# Patient Record
Sex: Male | Born: 1955 | Race: Black or African American | Hispanic: No | Marital: Single | Smoking: Former smoker
Health system: Southern US, Community
[De-identification: ages and names within clinical notes are randomized; demographics above are authoritative.]

## PROBLEM LIST (undated history)

## (undated) DIAGNOSIS — D500 Iron deficiency anemia secondary to blood loss (chronic): Secondary | ICD-10-CM

## (undated) DIAGNOSIS — Z8673 Personal history of transient ischemic attack (TIA), and cerebral infarction without residual deficits: Secondary | ICD-10-CM

## (undated) DIAGNOSIS — Z7901 Long term (current) use of anticoagulants: Secondary | ICD-10-CM

## (undated) DIAGNOSIS — C801 Malignant (primary) neoplasm, unspecified: Secondary | ICD-10-CM

## (undated) DIAGNOSIS — E7800 Pure hypercholesterolemia, unspecified: Secondary | ICD-10-CM

## (undated) DIAGNOSIS — Z7189 Other specified counseling: Secondary | ICD-10-CM

## (undated) DIAGNOSIS — Z9229 Personal history of other drug therapy: Secondary | ICD-10-CM

## (undated) DIAGNOSIS — I519 Heart disease, unspecified: Secondary | ICD-10-CM

## (undated) DIAGNOSIS — Z952 Presence of prosthetic heart valve: Secondary | ICD-10-CM

## (undated) DIAGNOSIS — I639 Cerebral infarction, unspecified: Secondary | ICD-10-CM

## (undated) HISTORY — DX: Other specified counseling: Z71.89

## (undated) HISTORY — PX: AORTIC VALVE REPLACEMENT: SHX41

## (undated) HISTORY — PX: MITRAL VALVE REPLACEMENT: SHX147

## (undated) HISTORY — DX: Pure hypercholesterolemia, unspecified: E78.00

## (undated) HISTORY — DX: Iron deficiency anemia secondary to blood loss (chronic): D50.0

## (undated) HISTORY — PX: CARDIAC VALVE REPLACEMENT: SHX585

## (undated) HISTORY — DX: Heart disease, unspecified: I51.9

## (undated) HISTORY — DX: Cerebral infarction, unspecified: I63.9

---

## 1997-11-16 ENCOUNTER — Encounter: Admission: RE | Admit: 1997-11-16 | Discharge: 1997-11-16 | Payer: Self-pay | Admitting: Family Medicine

## 1997-11-26 ENCOUNTER — Encounter: Admission: RE | Admit: 1997-11-26 | Discharge: 1997-11-26 | Payer: Self-pay | Admitting: Family Medicine

## 1997-12-08 ENCOUNTER — Encounter: Admission: RE | Admit: 1997-12-08 | Discharge: 1997-12-08 | Payer: Self-pay | Admitting: Family Medicine

## 1997-12-28 ENCOUNTER — Encounter: Admission: RE | Admit: 1997-12-28 | Discharge: 1997-12-28 | Payer: Self-pay | Admitting: Family Medicine

## 1997-12-30 ENCOUNTER — Encounter: Admission: RE | Admit: 1997-12-30 | Discharge: 1997-12-30 | Payer: Self-pay | Admitting: Family Medicine

## 1998-01-04 ENCOUNTER — Encounter: Admission: RE | Admit: 1998-01-04 | Discharge: 1998-01-04 | Payer: Self-pay | Admitting: Family Medicine

## 1998-01-11 ENCOUNTER — Encounter: Admission: RE | Admit: 1998-01-11 | Discharge: 1998-01-11 | Payer: Self-pay | Admitting: Family Medicine

## 1998-01-12 ENCOUNTER — Encounter: Admission: RE | Admit: 1998-01-12 | Discharge: 1998-01-12 | Payer: Self-pay | Admitting: Family Medicine

## 1998-01-18 ENCOUNTER — Encounter: Admission: RE | Admit: 1998-01-18 | Discharge: 1998-01-18 | Payer: Self-pay | Admitting: Family Medicine

## 1998-01-21 ENCOUNTER — Encounter: Admission: RE | Admit: 1998-01-21 | Discharge: 1998-01-21 | Payer: Self-pay | Admitting: Sports Medicine

## 1998-01-28 ENCOUNTER — Encounter: Admission: RE | Admit: 1998-01-28 | Discharge: 1998-01-28 | Payer: Self-pay | Admitting: Family Medicine

## 1998-02-04 ENCOUNTER — Encounter: Admission: RE | Admit: 1998-02-04 | Discharge: 1998-02-04 | Payer: Self-pay | Admitting: Family Medicine

## 1998-02-11 ENCOUNTER — Encounter: Admission: RE | Admit: 1998-02-11 | Discharge: 1998-02-11 | Payer: Self-pay | Admitting: Family Medicine

## 1998-02-18 ENCOUNTER — Encounter: Admission: RE | Admit: 1998-02-18 | Discharge: 1998-02-18 | Payer: Self-pay | Admitting: Family Medicine

## 1998-03-01 ENCOUNTER — Encounter: Admission: RE | Admit: 1998-03-01 | Discharge: 1998-03-01 | Payer: Self-pay | Admitting: Family Medicine

## 1998-03-15 ENCOUNTER — Encounter: Admission: RE | Admit: 1998-03-15 | Discharge: 1998-03-15 | Payer: Self-pay | Admitting: Family Medicine

## 1998-04-02 ENCOUNTER — Encounter: Admission: RE | Admit: 1998-04-02 | Discharge: 1998-04-02 | Payer: Self-pay | Admitting: Family Medicine

## 1998-04-09 ENCOUNTER — Encounter: Admission: RE | Admit: 1998-04-09 | Discharge: 1998-04-09 | Payer: Self-pay | Admitting: Family Medicine

## 1998-05-14 ENCOUNTER — Encounter: Admission: RE | Admit: 1998-05-14 | Discharge: 1998-05-14 | Payer: Self-pay | Admitting: Family Medicine

## 1998-05-28 ENCOUNTER — Encounter: Admission: RE | Admit: 1998-05-28 | Discharge: 1998-05-28 | Payer: Self-pay | Admitting: Family Medicine

## 1998-06-15 ENCOUNTER — Encounter: Admission: RE | Admit: 1998-06-15 | Discharge: 1998-06-15 | Payer: Self-pay | Admitting: Family Medicine

## 1998-06-16 ENCOUNTER — Encounter: Admission: RE | Admit: 1998-06-16 | Discharge: 1998-06-16 | Payer: Self-pay | Admitting: Family Medicine

## 1998-06-29 ENCOUNTER — Encounter: Admission: RE | Admit: 1998-06-29 | Discharge: 1998-06-29 | Payer: Self-pay | Admitting: Sports Medicine

## 1998-08-19 ENCOUNTER — Encounter: Admission: RE | Admit: 1998-08-19 | Discharge: 1998-08-19 | Payer: Self-pay | Admitting: Family Medicine

## 2000-01-05 ENCOUNTER — Emergency Department (HOSPITAL_COMMUNITY): Admission: EM | Admit: 2000-01-05 | Discharge: 2000-01-05 | Payer: Self-pay | Admitting: Emergency Medicine

## 2000-01-06 ENCOUNTER — Emergency Department (HOSPITAL_COMMUNITY): Admission: EM | Admit: 2000-01-06 | Discharge: 2000-01-06 | Payer: Self-pay | Admitting: Emergency Medicine

## 2002-07-22 ENCOUNTER — Emergency Department (HOSPITAL_COMMUNITY): Admission: EM | Admit: 2002-07-22 | Discharge: 2002-07-22 | Payer: Self-pay | Admitting: Emergency Medicine

## 2003-05-28 ENCOUNTER — Inpatient Hospital Stay (HOSPITAL_COMMUNITY): Admission: EM | Admit: 2003-05-28 | Discharge: 2003-06-03 | Payer: Self-pay | Admitting: Emergency Medicine

## 2003-05-28 ENCOUNTER — Encounter: Payer: Self-pay | Admitting: Emergency Medicine

## 2003-11-30 ENCOUNTER — Inpatient Hospital Stay (HOSPITAL_COMMUNITY): Admission: AC | Admit: 2003-11-30 | Discharge: 2003-12-10 | Payer: Self-pay

## 2003-12-02 ENCOUNTER — Encounter: Payer: Self-pay | Admitting: Cardiology

## 2003-12-15 ENCOUNTER — Ambulatory Visit (HOSPITAL_COMMUNITY): Admission: RE | Admit: 2003-12-15 | Discharge: 2003-12-15 | Payer: Self-pay | Admitting: General Surgery

## 2004-05-26 ENCOUNTER — Ambulatory Visit: Payer: Self-pay | Admitting: *Deleted

## 2004-06-09 ENCOUNTER — Ambulatory Visit: Payer: Self-pay | Admitting: Internal Medicine

## 2004-06-20 ENCOUNTER — Ambulatory Visit: Payer: Self-pay | Admitting: Cardiology

## 2004-07-13 ENCOUNTER — Ambulatory Visit: Payer: Self-pay | Admitting: Cardiology

## 2004-08-22 ENCOUNTER — Ambulatory Visit: Payer: Self-pay

## 2004-09-08 ENCOUNTER — Ambulatory Visit: Payer: Self-pay | Admitting: Cardiology

## 2007-02-08 ENCOUNTER — Ambulatory Visit: Payer: Self-pay | Admitting: Internal Medicine

## 2007-02-11 ENCOUNTER — Ambulatory Visit: Payer: Self-pay | Admitting: *Deleted

## 2007-02-12 ENCOUNTER — Ambulatory Visit: Payer: Self-pay | Admitting: Internal Medicine

## 2007-02-18 ENCOUNTER — Ambulatory Visit: Payer: Self-pay | Admitting: Internal Medicine

## 2007-02-25 ENCOUNTER — Ambulatory Visit: Payer: Self-pay | Admitting: Internal Medicine

## 2007-03-04 ENCOUNTER — Ambulatory Visit: Payer: Self-pay | Admitting: Internal Medicine

## 2007-04-01 ENCOUNTER — Ambulatory Visit: Payer: Self-pay | Admitting: Internal Medicine

## 2007-04-08 ENCOUNTER — Ambulatory Visit: Payer: Self-pay | Admitting: Internal Medicine

## 2007-05-06 ENCOUNTER — Ambulatory Visit: Payer: Self-pay | Admitting: Internal Medicine

## 2007-05-10 ENCOUNTER — Ambulatory Visit: Payer: Self-pay | Admitting: Internal Medicine

## 2007-05-30 ENCOUNTER — Ambulatory Visit: Payer: Self-pay | Admitting: Internal Medicine

## 2007-06-21 ENCOUNTER — Ambulatory Visit: Payer: Self-pay | Admitting: Internal Medicine

## 2007-06-21 LAB — CONVERTED CEMR LAB
ALT: 30 units/L (ref 0–53)
Albumin: 4.3 g/dL (ref 3.5–5.2)
Alkaline Phosphatase: 87 units/L (ref 39–117)
BUN: 16 mg/dL (ref 6–23)
Calcium: 9.5 mg/dL (ref 8.4–10.5)
Chloride: 106 meq/L (ref 96–112)
Cholesterol: 197 mg/dL (ref 0–200)
Creatinine, Ser: 1.06 mg/dL (ref 0.40–1.50)
Potassium: 4.5 meq/L (ref 3.5–5.3)
Triglycerides: 80 mg/dL (ref <150)

## 2007-06-28 ENCOUNTER — Ambulatory Visit: Payer: Self-pay | Admitting: Internal Medicine

## 2007-07-24 ENCOUNTER — Ambulatory Visit: Payer: Self-pay | Admitting: Internal Medicine

## 2007-08-22 ENCOUNTER — Ambulatory Visit: Payer: Self-pay | Admitting: Internal Medicine

## 2007-09-12 ENCOUNTER — Ambulatory Visit: Payer: Self-pay | Admitting: Internal Medicine

## 2007-09-19 ENCOUNTER — Ambulatory Visit: Payer: Self-pay | Admitting: Internal Medicine

## 2007-09-26 ENCOUNTER — Ambulatory Visit: Payer: Self-pay | Admitting: Internal Medicine

## 2007-10-07 ENCOUNTER — Ambulatory Visit: Payer: Self-pay | Admitting: Internal Medicine

## 2007-10-15 ENCOUNTER — Ambulatory Visit: Payer: Self-pay | Admitting: Internal Medicine

## 2007-11-14 ENCOUNTER — Ambulatory Visit: Payer: Self-pay | Admitting: Internal Medicine

## 2007-11-28 ENCOUNTER — Ambulatory Visit: Payer: Self-pay | Admitting: Internal Medicine

## 2007-12-16 ENCOUNTER — Ambulatory Visit: Payer: Self-pay | Admitting: Internal Medicine

## 2007-12-26 ENCOUNTER — Ambulatory Visit: Payer: Self-pay | Admitting: Internal Medicine

## 2008-01-09 ENCOUNTER — Ambulatory Visit: Payer: Self-pay | Admitting: Internal Medicine

## 2008-02-02 ENCOUNTER — Inpatient Hospital Stay (HOSPITAL_COMMUNITY): Admission: EM | Admit: 2008-02-02 | Discharge: 2008-02-07 | Payer: Self-pay | Admitting: Emergency Medicine

## 2008-02-20 ENCOUNTER — Ambulatory Visit: Payer: Self-pay | Admitting: Internal Medicine

## 2008-03-10 ENCOUNTER — Ambulatory Visit: Payer: Self-pay | Admitting: Internal Medicine

## 2008-03-10 LAB — CONVERTED CEMR LAB
INR: 1.4 (ref 0.0–1.5)
Prothrombin Time: 17.4 s — ABNORMAL HIGH (ref 11.6–15.2)

## 2008-03-20 ENCOUNTER — Ambulatory Visit: Payer: Self-pay | Admitting: Internal Medicine

## 2008-03-20 LAB — CONVERTED CEMR LAB: Prothrombin Time: 34 s — ABNORMAL HIGH (ref 11.6–15.2)

## 2008-04-10 ENCOUNTER — Ambulatory Visit: Payer: Self-pay | Admitting: Internal Medicine

## 2008-04-10 LAB — CONVERTED CEMR LAB
INR: 2.4 — ABNORMAL HIGH (ref 0.0–1.5)
Prothrombin Time: 27.3 s — ABNORMAL HIGH (ref 11.6–15.2)

## 2008-04-24 ENCOUNTER — Ambulatory Visit: Payer: Self-pay | Admitting: Internal Medicine

## 2008-04-24 LAB — CONVERTED CEMR LAB
INR: 1.2 (ref 0.0–1.5)
Prothrombin Time: 16 s — ABNORMAL HIGH (ref 11.6–15.2)

## 2008-05-01 ENCOUNTER — Ambulatory Visit: Payer: Self-pay | Admitting: Internal Medicine

## 2008-05-01 LAB — CONVERTED CEMR LAB: Prothrombin Time: 21 s — ABNORMAL HIGH (ref 11.6–15.2)

## 2008-05-15 ENCOUNTER — Ambulatory Visit: Payer: Self-pay | Admitting: Internal Medicine

## 2008-05-15 LAB — CONVERTED CEMR LAB: INR: 2.4 — ABNORMAL HIGH (ref 0.0–1.5)

## 2008-06-09 ENCOUNTER — Ambulatory Visit: Payer: Self-pay | Admitting: Internal Medicine

## 2008-06-09 LAB — CONVERTED CEMR LAB
INR: 1.6 — ABNORMAL HIGH (ref 0.0–1.5)
Prothrombin Time: 19.4 s — ABNORMAL HIGH (ref 11.6–15.2)

## 2008-06-16 ENCOUNTER — Ambulatory Visit: Payer: Self-pay | Admitting: Internal Medicine

## 2008-06-16 LAB — CONVERTED CEMR LAB: INR: 3.4 — ABNORMAL HIGH (ref 0.0–1.5)

## 2008-07-14 ENCOUNTER — Ambulatory Visit: Payer: Self-pay | Admitting: Internal Medicine

## 2008-08-11 ENCOUNTER — Ambulatory Visit: Payer: Self-pay | Admitting: Internal Medicine

## 2008-09-08 ENCOUNTER — Ambulatory Visit: Payer: Self-pay | Admitting: Internal Medicine

## 2008-10-01 ENCOUNTER — Ambulatory Visit: Payer: Self-pay | Admitting: Internal Medicine

## 2008-10-01 LAB — CONVERTED CEMR LAB
AST: 20 units/L (ref 0–37)
Alkaline Phosphatase: 78 units/L (ref 39–117)
BUN: 15 mg/dL (ref 6–23)
Basophils Absolute: 0 10*3/uL (ref 0.0–0.1)
Basophils Relative: 0 % (ref 0–1)
Creatinine, Ser: 1 mg/dL (ref 0.40–1.50)
Eosinophils Absolute: 0.2 10*3/uL (ref 0.0–0.7)
HDL: 46 mg/dL (ref >39)
Hemoglobin: 13.7 g/dL (ref 13.0–17.0)
LDL Cholesterol: 117 mg/dL — ABNORMAL HIGH (ref 0–99)
MCHC: 32.5 g/dL (ref 30.0–36.0)
MCV: 94.4 fL (ref 78.0–100.0)
Monocytes Absolute: 0.6 10*3/uL (ref 0.1–1.0)
Monocytes Relative: 13 % — ABNORMAL HIGH (ref 3–12)
RBC: 4.46 M/uL (ref 4.22–5.81)
RDW: 13.8 % (ref 11.5–15.5)
Total Bilirubin: 0.4 mg/dL (ref 0.3–1.2)
Total CHOL/HDL Ratio: 4.2
VLDL: 31 mg/dL (ref 0–40)

## 2008-10-29 ENCOUNTER — Ambulatory Visit: Payer: Self-pay | Admitting: Internal Medicine

## 2008-11-09 ENCOUNTER — Ambulatory Visit: Payer: Self-pay | Admitting: Internal Medicine

## 2008-11-24 ENCOUNTER — Ambulatory Visit: Payer: Self-pay | Admitting: Internal Medicine

## 2008-12-15 ENCOUNTER — Ambulatory Visit: Payer: Self-pay | Admitting: Internal Medicine

## 2008-12-29 ENCOUNTER — Ambulatory Visit: Payer: Self-pay | Admitting: Internal Medicine

## 2009-01-07 ENCOUNTER — Ambulatory Visit: Payer: Self-pay | Admitting: Internal Medicine

## 2009-01-14 ENCOUNTER — Ambulatory Visit: Payer: Self-pay | Admitting: Internal Medicine

## 2009-01-28 ENCOUNTER — Ambulatory Visit: Payer: Self-pay | Admitting: Internal Medicine

## 2009-02-25 ENCOUNTER — Ambulatory Visit: Payer: Self-pay | Admitting: Internal Medicine

## 2009-03-03 ENCOUNTER — Ambulatory Visit: Payer: Self-pay | Admitting: Internal Medicine

## 2009-03-03 ENCOUNTER — Ambulatory Visit: Payer: Self-pay | Admitting: *Deleted

## 2009-03-18 ENCOUNTER — Ambulatory Visit: Payer: Self-pay | Admitting: Family Medicine

## 2009-04-15 ENCOUNTER — Ambulatory Visit: Payer: Self-pay | Admitting: Internal Medicine

## 2009-04-26 ENCOUNTER — Ambulatory Visit: Payer: Self-pay | Admitting: Internal Medicine

## 2009-05-17 ENCOUNTER — Ambulatory Visit: Payer: Self-pay | Admitting: Internal Medicine

## 2009-06-14 ENCOUNTER — Ambulatory Visit: Payer: Self-pay | Admitting: Internal Medicine

## 2009-07-12 ENCOUNTER — Ambulatory Visit: Payer: Self-pay | Admitting: Internal Medicine

## 2009-08-09 ENCOUNTER — Ambulatory Visit: Payer: Self-pay | Admitting: Internal Medicine

## 2009-08-23 ENCOUNTER — Ambulatory Visit: Payer: Self-pay | Admitting: Internal Medicine

## 2009-09-06 ENCOUNTER — Ambulatory Visit: Payer: Self-pay | Admitting: Internal Medicine

## 2009-09-08 ENCOUNTER — Ambulatory Visit: Payer: Self-pay | Admitting: Internal Medicine

## 2009-09-21 ENCOUNTER — Ambulatory Visit (HOSPITAL_COMMUNITY): Admission: RE | Admit: 2009-09-21 | Discharge: 2009-09-21 | Payer: Self-pay | Admitting: Internal Medicine

## 2009-09-27 ENCOUNTER — Ambulatory Visit: Payer: Self-pay | Admitting: Internal Medicine

## 2009-10-20 ENCOUNTER — Ambulatory Visit: Payer: Self-pay | Admitting: Internal Medicine

## 2009-10-25 ENCOUNTER — Ambulatory Visit: Payer: Self-pay | Admitting: Internal Medicine

## 2009-11-03 ENCOUNTER — Ambulatory Visit: Payer: Self-pay | Admitting: Internal Medicine

## 2009-11-17 ENCOUNTER — Ambulatory Visit: Payer: Self-pay | Admitting: Internal Medicine

## 2009-12-01 ENCOUNTER — Ambulatory Visit: Payer: Self-pay | Admitting: Internal Medicine

## 2009-12-07 ENCOUNTER — Ambulatory Visit: Payer: Self-pay | Admitting: Internal Medicine

## 2009-12-20 ENCOUNTER — Ambulatory Visit: Payer: Self-pay | Admitting: Internal Medicine

## 2009-12-27 ENCOUNTER — Ambulatory Visit: Payer: Self-pay | Admitting: Internal Medicine

## 2010-01-06 ENCOUNTER — Ambulatory Visit: Payer: Self-pay | Admitting: Internal Medicine

## 2010-01-06 LAB — CONVERTED CEMR LAB
BUN: 15 mg/dL (ref 6–23)
Calcium: 9.5 mg/dL (ref 8.4–10.5)
Cholesterol: 176 mg/dL (ref 0–200)
Creatinine, Ser: 0.96 mg/dL (ref 0.40–1.50)
Glucose, Bld: 79 mg/dL (ref 70–99)
Potassium: 4.4 meq/L (ref 3.5–5.3)
VLDL: 9 mg/dL (ref 0–40)

## 2010-02-03 ENCOUNTER — Ambulatory Visit: Payer: Self-pay | Admitting: Internal Medicine

## 2010-03-03 ENCOUNTER — Ambulatory Visit: Payer: Self-pay | Admitting: Internal Medicine

## 2010-03-31 ENCOUNTER — Ambulatory Visit: Payer: Self-pay | Admitting: Internal Medicine

## 2010-04-14 ENCOUNTER — Ambulatory Visit: Payer: Self-pay | Admitting: Internal Medicine

## 2010-04-22 ENCOUNTER — Ambulatory Visit: Payer: Self-pay | Admitting: Internal Medicine

## 2010-04-22 LAB — CONVERTED CEMR LAB
INR: 4.16 — ABNORMAL HIGH (ref <1.50)
Prothrombin Time: 40.1 s — ABNORMAL HIGH (ref 11.6–15.2)

## 2010-04-29 ENCOUNTER — Encounter (INDEPENDENT_AMBULATORY_CARE_PROVIDER_SITE_OTHER): Payer: Self-pay | Admitting: Internal Medicine

## 2010-05-12 ENCOUNTER — Encounter (INDEPENDENT_AMBULATORY_CARE_PROVIDER_SITE_OTHER): Payer: Self-pay | Admitting: Internal Medicine

## 2010-05-12 LAB — CONVERTED CEMR LAB: Prothrombin Time: 20.3 s — ABNORMAL HIGH (ref 11.6–15.2)

## 2010-05-23 ENCOUNTER — Encounter (INDEPENDENT_AMBULATORY_CARE_PROVIDER_SITE_OTHER): Payer: Self-pay | Admitting: Internal Medicine

## 2010-06-07 ENCOUNTER — Encounter (INDEPENDENT_AMBULATORY_CARE_PROVIDER_SITE_OTHER): Payer: Self-pay | Admitting: Internal Medicine

## 2010-06-07 LAB — CONVERTED CEMR LAB: Prothrombin Time: 30.6 s — ABNORMAL HIGH (ref 11.6–15.2)

## 2010-07-27 ENCOUNTER — Encounter (INDEPENDENT_AMBULATORY_CARE_PROVIDER_SITE_OTHER): Payer: Self-pay | Admitting: Family Medicine

## 2010-07-27 LAB — CONVERTED CEMR LAB: INR: 3.88 — ABNORMAL HIGH (ref <1.50)

## 2010-09-04 ENCOUNTER — Encounter: Payer: Self-pay | Admitting: Internal Medicine

## 2010-09-04 ENCOUNTER — Encounter: Payer: Self-pay | Admitting: Specialist

## 2010-12-27 NOTE — H&P (Signed)
Jeff [name], Jeff [name]:  [id number]   MEDICAL RECORD NO.:  [id number]          PATIENT TYPE:  INP   LOCATION:  1434                         FACILITY:  [facility]   PHYSICIAN:  Della Goo, M.D. DATE OF BIRTH:  [birth year]   DATE OF ADMISSION:  02/02/2008  DATE OF DISCHARGE:                              HISTORY & PHYSICAL   PRIMARY CARE PHYSICIAN:  [facility].   CHIEF COMPLAINT:  Blood in urine.   HISTORY OF PRESENT ILLNESS:  This is a 55 year old male presenting to  the emergency department with complaints of hematuria that he has  noticed over the past 2 days.  He reports the blood in the urine was  light at first, but became heavier as time went on.  He also reports  having antibiotic treatment for dental work and he reports beginning to  have crampy abdominal pain over the past 2 days as well.  The patient  denies having any nausea, vomiting.  Denies having any diarrhea.  He  denies having any blood in his stool.  He denies having any chest pain  or any shortness of breath or any weakness.   PAST MEDICAL HISTORY:  Significant for an aortic valve replacement with  a St. Jude valve in 1996.  He is on chronic Coumadin therapy for this.   MEDICATIONS:  Include Coumadin 15 mg p.o. q. day.   He has no known drug allergies.   SOCIAL HISTORY:  The patient works as a Advice worker.  He smokes 3  cigarettes a day.  He is a nondrinker.  He denies any illicit drug  usage.   FAMILY HISTORY:  Positive for diabetes in his mother and no history of  coronary artery disease, hypertension, or cancer in his family that he  knows of.   REVIEW OF SYSTEMS:  Pertinents are mentioned above.   PHYSICAL EXAMINATION FINDINGS:  GENERAL:  This is a 55 year old well-  nourished, well-developed male in no discomfort or acute distress  currently.  VITAL SIGNS:  Temperature 98.2, blood pressure 144/110, heart rate 65,  respirations 18, O2 saturation 96% on room air.  HEENT:   Normocephalic, atraumatic.  Pupils equally round and reactive to  light.  Extraocular muscles are intact.  Funduscopic benign.  There is  no scleral icterus.  Oropharynx is clear.  No exudates or hemorrhages.  NECK:  Supple with full range of motion.  No thyromegaly, adenopathy,  jugular venous distention.  CARDIOVASCULAR:  Regular rate and rhythm.  No murmurs, gallops or rubs.  LUNGS:  Clear to auscultation bilaterally.  ABDOMEN:  Positive bowel sounds, soft, nontender, nondistended.  EXTREMITIES:  Without cyanosis, clubbing or edema.  NEUROLOGIC:  Nonfocal.   LABORATORY STUDIES:  White blood cell count 9.8, hemoglobin 13.4,  hematocrit 39.3, platelets 325,000, MCV 93.0, neutrophils 84%,  lymphocytes 8%.  Sodium 138, potassium 4.2, chloride 103, carbon dioxide  23, BUN 13, creatinine 1.09 and glucose 113.  Pro time 81.6 and INR 9.6.  Three-way abdomen has been ordered.   ASSESSMENT:  A 55 year old male being admitted with:  1. Toxic/supra-therapeutic Coumadin  level/coagulopathy secondary to      Coumadin therapy.  2. Hematuria secondary to #1.  3. Elevated blood pressure.  4. Abdominal pain.   PLAN:  The patient will be admitted to telemetry area for cardiac  monitoring.  The patient will be administered 1 unit of fresh frozen  plasma and vitamin K has been ordered by the emergency department  physician.  A repeat PT and INR will be checked 2 hours after  administration of the fresh frozen plasma.  The patient's goal range for  his valve is 2.5-3.5.  His Coumadin therapy will be held for now and  will be restarted and further adjusted once his symptoms and condition  improved.      Della Goo, M.D.  Electronically Signed     HJ/MEDQ  D:  02/03/2008  T:  02/03/2008  Job:  [id number]
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
## 2010-12-27 NOTE — Discharge Summary (Signed)
Jeff [name], PALMATIER                  ACCOUNT NO.:  [id number]   MEDICAL RECORD NO.:  [id number]          PATIENT TYPE:  INP   LOCATION:  1434                         FACILITY:  [facility]   PHYSICIAN:  Hind I Elsaid, MD      DATE OF BIRTH:  February 27, 1956   DATE OF ADMISSION:  02/02/2008  DATE OF DISCHARGE:  02/07/2008                               DISCHARGE SUMMARY   PRIMARY CARE PHYSICIAN:  Dr Reche Dixon from [facility].   DISCHARGE DIAGNOSES:  1. Hematuria, felt secondary to coagulopathy.  2. Coagulopathy secondary to high Coumadin.  3. History of aortic valve prosthesis.  4. History of mitral valve prosthesis, on chronic Coumadin.   DISCHARGE MEDICATIONS:  1. Coumadin 15 mg daily.  2. Lovenox 80 mg subcutaneously twice daily.  Today INR is 1.8.  The      patient will have home health RN to draw PT/INR on Saturday, Monday      and fax on the weekend to the [facility], call extension      [phone], extension [phone].  Also, the patient has Monday followed by      the pharmacist, Charmian Muff.  PT/INR will be followed up by her.   CONSULTATIONS:  None.   HISTORY OF PRESENT ILLNESS:  This is a 55 year old male presented with  hematuria for the last 2 days before admission, found to have INR of  9.6.  The patient admitted to the hospital for evaluation of hematuria  and coagulopathy.  1. Hematuria, thought to be secondary to high Coumadin level.  The      patient given vitamin K and FFP.  PT/INR soon converted to normal      level and his hematuria completely resolved after that.  The      patient is started on his Coumadin level for his Lovenox.  At this      time, INR was found to be 1.8.  Plan for this patient to keep INR      between 2.5 to 3.5.  Arrangement for home health RN to draw PT/INR      3 times weekly.  Results should be sent to the [facility]      pharmacist, Dr. Charmian Muff at the above extension.  The patient also      has to make a follow up with Dr. Reche Dixon as soon as  possible, the      patient informed.  During hospitalization, Lovenox teaching was      addressed.  During hospitalization no significant drop on his H&H      and hematuria completely resolved.  Hematuria secondary to      coagulopathy.  If hematuria returns, the patient may need to have      urology consultation and cystoscopy.  Repeat urinalysis did not      show any evidence of RBCs on the urine.  Hemoglobin remained      stable at 12.1.  As we mentioned, PT/INR today is 1.8.  The patient      informed about importance of having PT/INR between 2.5 to 3.5.  [facility] contacted and information was addressed about the new      change for the patient.      Hind Bosie Helper, MD  Electronically Signed     HIE/MEDQ  D:  02/07/2008  T:  02/07/2008  Job:  [id number]   cc:   Melvern Banker  Fax: [phone]   Reche Dixon, M.D.  [facility]
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
## 2011-02-15 ENCOUNTER — Inpatient Hospital Stay (HOSPITAL_COMMUNITY)
Admission: EM | Admit: 2011-02-15 | Discharge: 2011-02-21 | DRG: 314 | Disposition: A | Discharge status: Home / Self Care | Payer: Self-pay | Source: Ambulatory Visit | Attending: Neurology | Admitting: Neurology

## 2011-02-15 ENCOUNTER — Emergency Department (HOSPITAL_COMMUNITY): Payer: Self-pay

## 2011-02-15 DIAGNOSIS — T82897A Other specified complication of cardiac prosthetic devices, implants and grafts, initial encounter: Principal | ICD-10-CM | POA: Diagnosis present

## 2011-02-15 DIAGNOSIS — I6340 Cerebral infarction due to embolism of unspecified cerebral artery: Secondary | ICD-10-CM | POA: Diagnosis present

## 2011-02-15 DIAGNOSIS — Z954 Presence of other heart-valve replacement: Secondary | ICD-10-CM

## 2011-02-15 DIAGNOSIS — F1010 Alcohol abuse, uncomplicated: Secondary | ICD-10-CM | POA: Diagnosis present

## 2011-02-15 DIAGNOSIS — I10 Essential (primary) hypertension: Secondary | ICD-10-CM | POA: Diagnosis present

## 2011-02-15 DIAGNOSIS — Q2111 Secundum atrial septal defect: Secondary | ICD-10-CM

## 2011-02-15 DIAGNOSIS — R279 Unspecified lack of coordination: Secondary | ICD-10-CM | POA: Diagnosis present

## 2011-02-15 DIAGNOSIS — F17200 Nicotine dependence, unspecified, uncomplicated: Secondary | ICD-10-CM | POA: Diagnosis present

## 2011-02-15 DIAGNOSIS — Z7901 Long term (current) use of anticoagulants: Secondary | ICD-10-CM

## 2011-02-15 DIAGNOSIS — F1210 Cannabis abuse, uncomplicated: Secondary | ICD-10-CM | POA: Diagnosis present

## 2011-02-15 DIAGNOSIS — Q211 Atrial septal defect: Secondary | ICD-10-CM

## 2011-02-15 DIAGNOSIS — Z91199 Patient's noncompliance with other medical treatment and regimen due to unspecified reason: Secondary | ICD-10-CM

## 2011-02-15 DIAGNOSIS — Z9119 Patient's noncompliance with other medical treatment and regimen: Secondary | ICD-10-CM

## 2011-02-15 LAB — COMPREHENSIVE METABOLIC PANEL
Albumin: 4 g/dL (ref 3.5–5.2)
Alkaline Phosphatase: 82 U/L (ref 39–117)
BUN: 19 mg/dL (ref 6–23)
Calcium: 9.3 mg/dL (ref 8.4–10.5)
Creatinine, Ser: 1.08 mg/dL (ref 0.50–1.35)
GFR calc Af Amer: >60 mL/min (ref >60)
Glucose, Bld: 107 mg/dL — ABNORMAL HIGH (ref 70–99)
Potassium: 3.5 mEq/L (ref 3.5–5.1)
Total Protein: 7.4 g/dL (ref 6.0–8.3)

## 2011-02-15 LAB — CBC
MCH: 32.7 pg (ref 26.0–34.0)
MCHC: 35.1 g/dL (ref 30.0–36.0)
MCV: 93.1 fL (ref 78.0–100.0)
Platelets: 258 10*3/uL (ref 150–400)
RDW: 12.5 % (ref 11.5–15.5)

## 2011-02-15 LAB — DIFFERENTIAL
Basophils Relative: 0 % (ref 0–1)
Eosinophils Absolute: 0.2 10*3/uL (ref 0.0–0.7)
Eosinophils Relative: 3 % (ref 0–5)
Lymphs Abs: 1.6 10*3/uL (ref 0.7–4.0)
Monocytes Relative: 9 % (ref 3–12)
Neutrophils Relative %: 53 % (ref 43–77)

## 2011-02-15 LAB — PROTIME-INR: Prothrombin Time: 14.3 seconds (ref 11.6–15.2)

## 2011-02-16 ENCOUNTER — Inpatient Hospital Stay (HOSPITAL_COMMUNITY): Payer: Self-pay

## 2011-02-16 LAB — CARDIAC PANEL(CRET KIN+CKTOT+MB+TROPI)
Relative Index: INVALID (ref 0.0–2.5)
Total CK: 93 U/L (ref 7–232)
Troponin I: <0.3 ng/mL (ref <0.30)

## 2011-02-16 LAB — CBC
Hemoglobin: 13.4 g/dL (ref 13.0–17.0)
MCV: 92.8 fL (ref 78.0–100.0)
Platelets: 256 10*3/uL (ref 150–400)
RBC: 4.17 MIL/uL — ABNORMAL LOW (ref 4.22–5.81)
WBC: 3.9 10*3/uL — ABNORMAL LOW (ref 4.0–10.5)

## 2011-02-16 LAB — COMPREHENSIVE METABOLIC PANEL
AST: 20 U/L (ref 0–37)
Albumin: 3.6 g/dL (ref 3.5–5.2)
Calcium: 8.9 mg/dL (ref 8.4–10.5)
Creatinine, Ser: 0.97 mg/dL (ref 0.50–1.35)

## 2011-02-16 LAB — PROTIME-INR
INR: 1.04 (ref 0.00–1.49)
Prothrombin Time: 13.8 seconds (ref 11.6–15.2)

## 2011-02-16 LAB — LIPID PANEL
HDL: 46 mg/dL (ref >39)
LDL Cholesterol: 82 mg/dL (ref 0–99)
Total CHOL/HDL Ratio: 3 RATIO
Triglycerides: 57 mg/dL (ref <150)
VLDL: 11 mg/dL (ref 0–40)

## 2011-02-16 LAB — URINALYSIS, ROUTINE W REFLEX MICROSCOPIC
Ketones, ur: NEGATIVE mg/dL
Leukocytes, UA: NEGATIVE
Nitrite: NEGATIVE
pH: 6.5 (ref 5.0–8.0)

## 2011-02-16 LAB — RAPID URINE DRUG SCREEN, HOSP PERFORMED
Benzodiazepines: NOT DETECTED
Opiates: NOT DETECTED

## 2011-02-16 MED ORDER — IOHEXOL 350 MG/ML SOLN
50.0000 mL | Freq: Once | INTRAVENOUS | Status: AC | PRN
  Administered 2011-02-16: 50 mL via INTRAVENOUS

## 2011-02-17 LAB — CBC
HCT: 37 % — ABNORMAL LOW (ref 39.0–52.0)
MCHC: 34.6 g/dL (ref 30.0–36.0)
MCV: 93.2 fL (ref 78.0–100.0)
RDW: 12.1 % (ref 11.5–15.5)

## 2011-02-17 LAB — HEPARIN LEVEL (UNFRACTIONATED): Heparin Unfractionated: 0.45 IU/mL (ref 0.30–0.70)

## 2011-02-18 LAB — CBC
HCT: 36.4 % — ABNORMAL LOW (ref 39.0–52.0)
Hemoglobin: 12.5 g/dL — ABNORMAL LOW (ref 13.0–17.0)
MCHC: 34.3 g/dL (ref 30.0–36.0)

## 2011-02-18 LAB — PROTIME-INR: Prothrombin Time: 17.2 seconds — ABNORMAL HIGH (ref 11.6–15.2)

## 2011-02-19 LAB — HEPARIN LEVEL (UNFRACTIONATED): Heparin Unfractionated: 0.21 IU/mL — ABNORMAL LOW (ref 0.30–0.70)

## 2011-02-19 LAB — CBC
MCV: 93.1 fL (ref 78.0–100.0)
Platelets: 273 10*3/uL (ref 150–400)
RBC: 4.19 MIL/uL — ABNORMAL LOW (ref 4.22–5.81)
WBC: 4.3 10*3/uL (ref 4.0–10.5)

## 2011-02-20 LAB — CBC
HCT: 39 % (ref 39.0–52.0)
Hemoglobin: 13.2 g/dL (ref 13.0–17.0)
MCH: 31.9 pg (ref 26.0–34.0)
MCHC: 33.8 g/dL (ref 30.0–36.0)
MCV: 94.2 fL (ref 78.0–100.0)
RDW: 12.5 % (ref 11.5–15.5)

## 2011-02-20 LAB — PROTIME-INR: Prothrombin Time: 24.9 seconds — ABNORMAL HIGH (ref 11.6–15.2)

## 2011-02-21 DIAGNOSIS — I6789 Other cerebrovascular disease: Secondary | ICD-10-CM

## 2011-02-21 LAB — CBC
Hemoglobin: 12.9 g/dL — ABNORMAL LOW (ref 13.0–17.0)
MCV: 93.9 fL (ref 78.0–100.0)
Platelets: 293 10*3/uL (ref 150–400)
RBC: 3.93 MIL/uL — ABNORMAL LOW (ref 4.22–5.81)
WBC: 4.1 10*3/uL (ref 4.0–10.5)

## 2011-02-21 LAB — HEPARIN LEVEL (UNFRACTIONATED): Heparin Unfractionated: 0.43 IU/mL (ref 0.30–0.70)

## 2011-03-01 NOTE — H&P (Signed)
NAMEMarland Kitchen  Jeff [name]:  [id number]  MEDICAL RECORD NO.:  [id number]  LOCATION:  3002                         FACILITY:  [facility]  PHYSICIAN:  Marlan [name], M.D.  DATE OF BIRTH:  1956-05-18  DATE OF ADMISSION:  02/15/2011 DATE OF DISCHARGE:                             HISTORY & PHYSICAL   HISTORY OF PRESENT ILLNESS:  Jeff [name] is a 55 year old right-handed black male, born November 15, 1955, with a history of an aortic valve replacement with a St. Jude's valve.  This patient has been on Coumadin therapy, but when have his medication 2 weeks ago and was not able to afford another prescription for the Coumadin.  The patient noted onset of headache and gait instability, that was noted on the morning of February 11, 2011.  The patient was last seen normal on February 10, 2011.  The patient noted that he tended to veer to the right with walking, had little bit of slurred speech, headache, and had difficulty with handwriting.  The headache and some of the walking problems improved over the next day or so and he did not seek medical attention.  The patient continued to have difficulty with handwriting some, difficulty with speech, but no headache since that time.  The patient talked with a friend today who convinced him to come to the emergency room for an evaluation, which he did.  The CT scan of the brain was done showing evidence of subacute right cerebellar stroke without compromise of the fourth ventricle.  Multiple bihemispheric cortical old strokes were seen.  Neurology was asked to see the patient for further evaluation. NIH stroke scale score is 2.  The patient is not a TPA candidate secondary to duration of symptoms.  PAST MEDICAL HISTORY:  Significant for: 1. History of cerebrovascular disease with bihemispheric strokes by     CT. 2. Aortic valve replacement with St. Jude valve. 3. New onset right cerebellar stroke.  The patient was on Coumadin     prior to  coming in, has taken 10 mg on Tuesdays and Thursdays, 15     mg all other days.  This is his only medication.  The patient states no known allergies.  Smokes three cigarettes daily. Drinks 2-3 40 ounces beers daily.  Does not use illicit drugs.  SOCIAL HISTORY:  The patient is single and has four daughters who are alive and well.  The patient is unemployed currently.  FAMILY MEDICAL HISTORY:  Father is still living, good health.  Mother died with complications of diabetes.  The patient has one brother who died with heart disease, three living brothers who are alive and well, two sisters who are alive and well.  REVIEW OF SYSTEMS:  Notable for no recent fevers or chills.  The patient denies any further headache.  Denies visual field changes, difficulty neck pain, back pain.  The patient denies any problems of shortness of breath, chest pains, abdominal pain, nausea or vomiting, difficulty controlling the bowels or bladder, blacking out episodes or dizziness.  PHYSICAL EXAMINATION:  VITAL SIGNS:  Blood pressure is 130/100, heart rate 78, respiratory rate 16, temperature afebrile. GENERAL:  This patient  is a fairly well-developed black male who is alert and cooperative at the time of examination. HEENT:  Head is atraumatic.  Eyes:  Pupils are round and reactive to light. NECK:  Supple.  No carotid bruits are noted. RESPIRATORY:  Clear. CARDIOVASCULAR:  Reveals regular rate and rhythm.  Valvular click is noted. EXTREMITIES:  Without significant edema. NEUROLOGIC:  Cranial nerves are as above.  Facial asymmetry is present. The patient has good sensation in the face to pinprick and soft touch bilaterally.  He has good strength to facial muscles and the muscles to head turn shrug bilaterally.  Extraocular movements are full.  Visual fields are full.  Speech is well enunciated, not aphasic.  Motor testing reveals 5/5 strength in all fours.  Good symmetric motor tone is noted throughout.   Sensory testing is intact to pinprick, soft touch, vibratory sensation throughout.  The patient has good finger-nose-finger and heel-to-shin on the left side, mild ataxia was noted on the right side.  Gait was not tested.  Deep tendon reflexes are symmetric.  LABORATORY VALUES:  Notable for white count of 4.6, hemoglobin of 13.7, hematocrit of 39.0, MCV of 93.1, platelets of 258.  INR of 1.09.  Sodium 141, potassium 3.5, chloride of 105, CO2 25, glucose of 107, BUN of 19, creatinine of 1.08.  Alk phosphatase of 82, SGOT of 23, SGOT of 14, total protein 7.4, albumin 4.0, calcium 9.3.  CT of head is as above.  IMPRESSION: 1. New onset right cerebellar stroke. 2. Aortic valve replacement with mechanical heart valve, off Coumadin     for 2 weeks.  This patient will be admitted for evaluation.  The patient has been off of his Coumadin and needs to get back on this medication as soon as possible.  The patient would placed back on Coumadin and have CT angiogram of the head and neck, 2-D echocardiogram and will have physical and occupational therapy.     Marlan [name], M.D.     CKW/MEDQ  D:  02/15/2011  T:  02/16/2011  Job:  [id number]  cc:   Clinic [facility]  Electronically Signed by Thana Farr M.D. on 03/01/2011 08:23:45 AM

## 2011-03-10 NOTE — Discharge Summary (Signed)
Jeff [name], Jeff [name]:  [id number]  MEDICAL RECORD NO.:  [id number]  LOCATION:  3002                         FACILITY:  [facility]  PHYSICIAN:  Leaman Abe P. Pearlean Brownie, MD    DATE OF BIRTH:  26-Oct-1955  DATE OF ADMISSION:  02/15/2011 DATE OF DISCHARGE:  02/21/2011                              DISCHARGE SUMMARY   DIAGNOSES AT THE TIME OF DISCHARGE: 1. Cardioembolic right cerebellar infarct secondary to prosthetic     heart valve and subtherapeutic INR. 2. Aortic valve replacement with St. Jude's valve in 1996, on chronic     Coumadin therapy. 3. Noncompliance with medical care. 4. Hypertension. 5. Cigarette smoker. 6. Alcohol use. 7. Current marijuana use.  MEDICINES AT THE TIME OF DISCHARGE: 1. Lisinopril 5 mg a day. 2. Coumadin 15 mg a day.  STUDIES PERFORMED: 1. CT of the brain on admission shows chronic bi-hemispheric infarct.     Acute or subacute right cerebellar infarct affecting much of the  superior cerebellar artery territory. 2. CT angio of the neck suggest     50% stenosis right ICA at petrous cavernous junction. 3. CT angio of the brain shows no occlusion, stenosis, dissections, or     aneurysms. 4. 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  EF 55-60%.  A mechanical prosthesis was present at     the mitral valve, mechanical prosthesis was present on the aortic     valve. 5. Carotid Doppler not performed. 6. EKG shows sinus tachycardia with possible left atrial enlargement,     left ventricular hypertrophy, T-wave abnormality, consider lateral     ischemia.  There are new lateral T-wave changes since previous     tracing. 7. TEE performed by Dr. 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  Positive  PFO.  No thrombus.  No vegetation.  Mechanical St. Jude valve     poorly visualized due to shadowing for mitral valve, unable to     measure gradient.  Doubt significant stenosis in the mitral valve.  LABORATORY STUDIES:  INR on day of discharge 2.63.  CBC with hemoglobin 12.9, hematocrit 36.9, white blood cells 4.1, platelets 293, hemoglobin A1c 5.3, cholesterol 139, triglycerides 57, HDL 46, LDL 82.  Chemistry normal.  Urine drug screen on admission positive for THC.  Urinalysis normal.  INR on admission 1.09.  HISTORY OF PRESENT ILLNESS:  [name] is 55 year old right-handed African American male with a history of aortic valve replacement with St. Jude valve in 1996.  The patient has been on chronic Coumadin therapy, but when he had his medications refilled 2 weeks ago, he was unable to afford a prescription for Coumadin.  The patient then began to notice onset of headache and gait instability the morning of February 11, 2011.  He was last seen normal the day prior February 10, 2011.  The  patient noted he tended to veer to the right with walking, had a little bit of slurred speech, headache, and difficulty with handwriting.  The headache and some of the walking problems improved over the next day or so, so he did not seek medical attention.  The patient continued to have difficulty with handwriting with some difficulty and speech.  He talked with a friend today of the admission, he will convince him to come to the emergency room for evaluation.  The CT done in the emergency room showed a subacute right cerebellar stroke without compromise of the fourth ventricle, multiple by hemisphere cortical, old strokes were seen.  Neurology was asked to see the patient for further evaluation. NIH stroke scale was 2.  The patient was not a tPA candidate secondary to delay in arrival.  He was admitted for further evaluation.  HOSPITAL COURSE:  The right superior cerebellar infarct was felt to  be subacute secondary to subtherapeutic INR while off Coumadin.  Coumadin was resumed in hospital along with IV heparin.  A 2-D echocardiogram showed possible LV mobile mass.  The patient was kept in hospital over the weekend for a TEE which showed mobile structure and LV cavity to be loose cord and possible mobile papillary muscle after following valve apparatus disruption by MD surgery instead of mass.  Coumadin became therapeutic by Tuesday with INR at 2.63.  He was evaluated by PT, OT, and Speech Therapy and felt to have no followup needs.  He is followed by [facility] and his [facility] card expires December 2012, so it is still good for followup there.  Followup appointment has been made for March 08, 2011, though we will have him follow up with INR check prior to then.  Patient is stable and medically is ready for discharge.  CONDITION AT THE TIME OF DISCHARGE:  The patient is alert and oriented x3.  Speech clear.  No aphasia.  No dysarthria.  Moves all four extremities without difficulty.  Gait is steady.  No focal neurologic deficit.  DISCHARGE PLAN: 1. Discharged home with friends/family. 2. Coumadin for secondary stroke prevention.  Discharged on 15 mg     daily.  We will follow up [facility] in 2-3 days for INR check.     They are not open at the time of discharge.  We will call and     arrange appointment tomorrow and alert the patient via telephone,     his number is [phone].  His friend Jeff [name] is [phone].  3. Followup Dr. Pearlean Brownie in 2 months. 4. He will followup cardiologist within 1 month. 5. He will followup primary care physician within 1 month.     Annie Main, N.P.   ______________________________ [name], MD    SB/MEDQ  D:  02/21/2011  T:  02/22/2011  Job:  [id number]  cc:   Clinic [facility]  Electronically Signed by Annie Main N.P. on 02/27/2011 03:44:16 PM Electronically Signed by Delia Heady MD on 03/10/2011 11:14:49 AM

## 2011-05-11 LAB — URINALYSIS, ROUTINE W REFLEX MICROSCOPIC
Bilirubin Urine: NEGATIVE
Ketones, ur: 15 — AB
Ketones, ur: NEGATIVE
Nitrite: NEGATIVE
Nitrite: POSITIVE — AB
Protein, ur: >300 — AB
Specific Gravity, Urine: 1.015
pH: 5.5
pH: 6.5

## 2011-05-11 LAB — CBC
HCT: 34.7 — ABNORMAL LOW
HCT: 35.3 — ABNORMAL LOW
HCT: 39.3
Hemoglobin: 11.6 — ABNORMAL LOW
Hemoglobin: 12.2 — ABNORMAL LOW
Hemoglobin: 13.1
MCHC: 34.2
MCV: 91.9
MCV: 93
MCV: 94.7
Platelets: 292
Platelets: 325
Platelets: 326
Platelets: 367
RDW: 12.6
RDW: 12.8
RDW: 13.1
RDW: 13.2
WBC: 4.2

## 2011-05-11 LAB — BASIC METABOLIC PANEL
BUN: 11
BUN: 13
BUN: 16
CO2: 29
Calcium: 10.2
Calcium: 8.9
Chloride: 103
Chloride: 108
GFR calc Af Amer: >60
GFR calc non Af Amer: >60
GFR calc non Af Amer: >60
GFR calc non Af Amer: >60
Glucose, Bld: 106 — ABNORMAL HIGH
Glucose, Bld: 106 — ABNORMAL HIGH
Glucose, Bld: 113 — ABNORMAL HIGH
Glucose, Bld: 118 — ABNORMAL HIGH
Potassium: 3.8
Potassium: 4.2
Sodium: 139
Sodium: 140
Sodium: 141

## 2011-05-11 LAB — URINE CULTURE
Colony Count: NO GROWTH
Special Requests: NEGATIVE

## 2011-05-11 LAB — PROTIME-INR
INR: 1.5
INR: 3.3 — ABNORMAL HIGH
Prothrombin Time: 17.2 — ABNORMAL HIGH
Prothrombin Time: 26.2 — ABNORMAL HIGH
Prothrombin Time: 81.6 — ABNORMAL HIGH

## 2011-05-11 LAB — PREPARE FRESH FROZEN PLASMA

## 2011-05-11 LAB — URINE MICROSCOPIC-ADD ON

## 2011-05-11 LAB — DIFFERENTIAL
Basophils Absolute: 0
Basophils Relative: 0
Eosinophils Absolute: 0
Eosinophils Relative: 0

## 2011-05-11 LAB — ABO/RH: ABO/RH(D): B POS

## 2012-04-02 ENCOUNTER — Encounter (HOSPITAL_COMMUNITY): Payer: Self-pay | Admitting: *Deleted

## 2012-04-02 ENCOUNTER — Emergency Department (HOSPITAL_COMMUNITY)
Admission: EM | Admit: 2012-04-02 | Discharge: 2012-04-02 | Disposition: A | Discharge status: Home / Self Care | Payer: Self-pay | Attending: Emergency Medicine | Admitting: Emergency Medicine

## 2012-04-02 DIAGNOSIS — L0390 Cellulitis, unspecified: Secondary | ICD-10-CM

## 2012-04-02 DIAGNOSIS — IMO0002 Reserved for concepts with insufficient information to code with codable children: Secondary | ICD-10-CM | POA: Insufficient documentation

## 2012-04-02 DIAGNOSIS — F17200 Nicotine dependence, unspecified, uncomplicated: Secondary | ICD-10-CM | POA: Insufficient documentation

## 2012-04-02 DIAGNOSIS — Z7901 Long term (current) use of anticoagulants: Secondary | ICD-10-CM | POA: Insufficient documentation

## 2012-04-02 DIAGNOSIS — L0291 Cutaneous abscess, unspecified: Secondary | ICD-10-CM

## 2012-04-02 DIAGNOSIS — Z954 Presence of other heart-valve replacement: Secondary | ICD-10-CM | POA: Insufficient documentation

## 2012-04-02 MED ORDER — CEPHALEXIN 500 MG PO CAPS: 500.0000 mg | ORAL_CAPSULE | Freq: Four times a day (QID) | ORAL | Status: DC

## 2012-04-02 MED ORDER — OXYCODONE-ACETAMINOPHEN 5-325 MG PO TABS: 1.0000 | ORAL_TABLET | Freq: Four times a day (QID) | ORAL | Status: AC | PRN

## 2012-04-02 MED ORDER — OXYCODONE-ACETAMINOPHEN 5-325 MG PO TABS
2.0000 | ORAL_TABLET | Freq: Once | ORAL | Status: AC
  Administered 2012-04-02: 2 via ORAL
  Filled 2012-04-02: qty 2

## 2012-04-02 NOTE — Progress Notes (Signed)
[facility] active orange card holder [facility]. Pt seen by [facility] coordinator and offered services Pt appreciative and voiced understanding of resources/services

## 2012-04-02 NOTE — ED Notes (Signed)
Abscess to left forearm x 2-3 days. Reports a little drainage.

## 2012-04-02 NOTE — ED Provider Notes (Signed)
History     CSN: [id number]  Arrival date & time 04/02/12  1416   First MD Initiated Contact with Patient 04/02/12 1508      Chief Complaint  Patient presents with  . Abscess    (Consider location/radiation/quality/duration/timing/severity/associated sxs/prior treatment) HPI Comments: Jeff [name] 56 y.o. male   The chief complaint is: Patient presents with:   Abscess   The patient has medical history significant for:   History reviewed. No pertinent past medical history.  Patient presents with a complaint of pain and swelling of his left forearm since the weekend. He states that the pain is a 5-6/10 and moving his arm increases the pain. He has never had anything like this before. Denies fever, chills, night sweats. Denies NVD or abdominal pain. Denies CP, SOB, palpitations.     Patient is a 56 y.o. male presenting with abscess. The history is provided by the patient.  Abscess  Pertinent negatives include no fever, no diarrhea and no vomiting.    History reviewed. No pertinent past medical history.  Past Surgical History  Procedure Date  . Cardiac valve replacement     No family history on file.  History  Substance Use Topics  . Smoking status: Current Everyday Smoker  . Smokeless tobacco: Not on file  . Alcohol Use: Yes      Review of Systems  Constitutional: Negative for fever, chills and diaphoresis.  Respiratory: Negative for shortness of breath.   Cardiovascular: Negative for chest pain and palpitations.  Gastrointestinal: Negative for nausea, vomiting, abdominal pain and diarrhea.  Skin: Positive for color change.  All other systems reviewed and are negative.    Allergies  Review of patient's allergies indicates no known allergies.  Home Medications   Current Outpatient Rx  Name Route Sig Dispense Refill  . WARFARIN SODIUM 5 MG PO TABS Oral Take 5 mg by mouth daily.      BP 128/86  Temp 98 F (36.7 C) (Oral)  Resp 16  SpO2  100%  Physical Exam  Nursing note and vitals reviewed. Constitutional: He appears well-developed and well-nourished. No distress.  HENT:  Head: Normocephalic and atraumatic.  Mouth/Throat: Oropharynx is clear and moist.  Eyes: Conjunctivae and EOM are normal. No scleral icterus.  Neck: Normal range of motion. Neck supple.  Cardiovascular: Regular rhythm and normal heart sounds.   Pulmonary/Chest: Effort normal and breath sounds normal.  Abdominal: Soft. Bowel sounds are normal. There is no tenderness.  Musculoskeletal: Normal range of motion. He exhibits edema and tenderness.       Edema, tenderness, and area of induration, consistent with abscess and cellulitis on left for arm.   Neurological: He is alert.  Skin: Skin is warm and dry.  INCISION AND DRAINAGE Performed by: Pixie Casino Consent: Verbal consent obtained. Risks and benefits: risks, benefits and alternatives were discussed Type: abscess  Body area: left forearm  Anesthesia: local infiltration  Local anesthetic: lidocaine 1%   Anesthetic total: 2ml  Complexity: complex Blunt dissection to break up loculations  Drainage: purulent  Drainage amount: 3ml  Packing material: 1/4 in iodoform gauze  Patient tolerance: Patient tolerated the procedure well with no immediate complications.     ED Course  Procedures (including critical care time)  Labs Reviewed - No data to display No results found.   1. Abscess   2. Cellulitis       MDM  Patient presented with abscess on the left forearm that he noticed over the weekend. Area had marked  swelling, redness, and warmth indicative of concomitant cellulitis. I & D done successfully without complication. Patient discharged on ABX and pain medication with instruction to return in 48 hours for wound recheck. No red flags for septic arthritis. Return precautions given verbally and in discharge instructions.         Pixie Casino, PA-C 04/02/12 1856

## 2012-04-03 NOTE — ED Provider Notes (Signed)
Medical screening examination/treatment/procedure(s) were conducted as a shared visit with non-physician practitioner(s) and myself.  I personally evaluated the patient during the encounter  Patient with evidence of abscess and cellulitis of his left forearm.  Incision and drainage performed at the bedside by physician assistant.  Oral antibiotics in the emergency department home on antibiotics.  The patient will return to the ER in 48 hours for wound check.  He has normal pulses and sensation distally.  His compartments are soft.  He has full range of motion of his left wrist and left elbow and therefore there are no suggestions of extension into the joint spaces.  Lyanne Co, MD 04/03/12 [phone]

## 2012-04-04 ENCOUNTER — Encounter (HOSPITAL_COMMUNITY): Payer: Self-pay | Admitting: Emergency Medicine

## 2012-04-04 ENCOUNTER — Inpatient Hospital Stay (HOSPITAL_COMMUNITY)
Admission: EM | Admit: 2012-04-04 | Discharge: 2012-04-06 | DRG: 603 | Disposition: A | Discharge status: Home / Self Care | Payer: MEDICAID | Attending: Internal Medicine | Admitting: Internal Medicine

## 2012-04-04 DIAGNOSIS — Z79899 Other long term (current) drug therapy: Secondary | ICD-10-CM

## 2012-04-04 DIAGNOSIS — Z9229 Personal history of other drug therapy: Secondary | ICD-10-CM

## 2012-04-04 DIAGNOSIS — L02419 Cutaneous abscess of limb, unspecified: Secondary | ICD-10-CM

## 2012-04-04 DIAGNOSIS — Z952 Presence of prosthetic heart valve: Secondary | ICD-10-CM | POA: Insufficient documentation

## 2012-04-04 DIAGNOSIS — Z8673 Personal history of transient ischemic attack (TIA), and cerebral infarction without residual deficits: Secondary | ICD-10-CM

## 2012-04-04 DIAGNOSIS — L03114 Cellulitis of left upper limb: Secondary | ICD-10-CM | POA: Diagnosis present

## 2012-04-04 DIAGNOSIS — Z954 Presence of other heart-valve replacement: Secondary | ICD-10-CM

## 2012-04-04 DIAGNOSIS — IMO0002 Reserved for concepts with insufficient information to code with codable children: Principal | ICD-10-CM | POA: Diagnosis present

## 2012-04-04 DIAGNOSIS — Z7901 Long term (current) use of anticoagulants: Secondary | ICD-10-CM

## 2012-04-04 HISTORY — DX: Presence of prosthetic heart valve: Z95.2

## 2012-04-04 HISTORY — DX: Personal history of other drug therapy: Z92.29

## 2012-04-04 HISTORY — DX: Long term (current) use of anticoagulants: Z79.01

## 2012-04-04 HISTORY — DX: Personal history of transient ischemic attack (TIA), and cerebral infarction without residual deficits: Z86.73

## 2012-04-04 LAB — CBC WITH DIFFERENTIAL/PLATELET
Basophils Relative: 0 % (ref 0–1)
Eosinophils Absolute: 0.2 10*3/uL (ref 0.0–0.7)
Eosinophils Relative: 2 % (ref 0–5)
Hemoglobin: 13.2 g/dL (ref 13.0–17.0)
Lymphs Abs: 1.1 10*3/uL (ref 0.7–4.0)
MCH: 32.1 pg (ref 26.0–34.0)
MCHC: 33.4 g/dL (ref 30.0–36.0)
MCV: 96.1 fL (ref 78.0–100.0)
Monocytes Relative: 8 % (ref 3–12)
Platelets: 342 10*3/uL (ref 150–400)
RBC: 4.11 MIL/uL — ABNORMAL LOW (ref 4.22–5.81)

## 2012-04-04 LAB — POCT I-STAT, CHEM 8
Creatinine, Ser: 1 mg/dL (ref 0.50–1.35)
Glucose, Bld: 93 mg/dL (ref 70–99)
Hemoglobin: 14.6 g/dL (ref 13.0–17.0)
TCO2: 25 mmol/L (ref 0–100)

## 2012-04-04 MED ORDER — VANCOMYCIN HCL IN DEXTROSE 1-5 GM/200ML-% IV SOLN
1000.0000 mg | Freq: Two times a day (BID) | INTRAVENOUS | Status: DC
  Administered 2012-04-05 – 2012-04-06 (×3): 1000 mg via INTRAVENOUS
  Filled 2012-04-04 (×4): qty 200

## 2012-04-04 MED ORDER — VANCOMYCIN HCL IN DEXTROSE 1-5 GM/200ML-% IV SOLN
1000.0000 mg | Freq: Once | INTRAVENOUS | Status: DC
  Filled 2012-04-04: qty 200

## 2012-04-04 MED ORDER — WARFARIN SODIUM 7.5 MG PO TABS: 15.0000 mg | ORAL_TABLET | Freq: Every day | ORAL | Status: DC

## 2012-04-04 MED ORDER — HYDROCODONE-ACETAMINOPHEN 5-325 MG PO TABS
1.0000 | ORAL_TABLET | ORAL | Status: DC | PRN
  Administered 2012-04-04 – 2012-04-06 (×3): 2 via ORAL
  Filled 2012-04-04 (×3): qty 2

## 2012-04-04 MED ORDER — ACETAMINOPHEN 325 MG PO TABS: 650.0000 mg | ORAL_TABLET | Freq: Four times a day (QID) | ORAL | Status: DC | PRN

## 2012-04-04 MED ORDER — ONDANSETRON HCL 4 MG PO TABS: 4.0000 mg | ORAL_TABLET | Freq: Four times a day (QID) | ORAL | Status: DC | PRN

## 2012-04-04 MED ORDER — ENOXAPARIN SODIUM 80 MG/0.8ML ~~LOC~~ SOLN
1.0000 mg/kg | Freq: Two times a day (BID) | SUBCUTANEOUS | Status: DC
  Administered 2012-04-04 – 2012-04-06 (×4): 80 mg via SUBCUTANEOUS
  Filled 2012-04-04 (×5): qty 0.8

## 2012-04-04 MED ORDER — CLINDAMYCIN PHOSPHATE 600 MG/50ML IV SOLN
600.0000 mg | Freq: Four times a day (QID) | INTRAVENOUS | Status: DC
  Administered 2012-04-04: 600 mg via INTRAVENOUS
  Filled 2012-04-04 (×2): qty 50

## 2012-04-04 MED ORDER — VANCOMYCIN HCL IN DEXTROSE 1-5 GM/200ML-% IV SOLN
1000.0000 mg | INTRAVENOUS | Status: AC
  Administered 2012-04-04: 1000 mg via INTRAVENOUS
  Filled 2012-04-04: qty 200

## 2012-04-04 MED ORDER — SODIUM CHLORIDE 0.9 % IV SOLN: INTRAVENOUS | Status: AC

## 2012-04-04 MED ORDER — SODIUM CHLORIDE 0.9 % IV SOLN
Freq: Once | INTRAVENOUS | Status: AC
  Administered 2012-04-04: 17:00:00 via INTRAVENOUS

## 2012-04-04 MED ORDER — ONDANSETRON HCL 4 MG/2ML IJ SOLN: 4.0000 mg | Freq: Three times a day (TID) | INTRAMUSCULAR | Status: AC | PRN

## 2012-04-04 MED ORDER — ACETAMINOPHEN 650 MG RE SUPP: 650.0000 mg | Freq: Four times a day (QID) | RECTAL | Status: DC | PRN

## 2012-04-04 MED ORDER — ONDANSETRON HCL 4 MG/2ML IJ SOLN: 4.0000 mg | Freq: Four times a day (QID) | INTRAMUSCULAR | Status: DC | PRN

## 2012-04-04 MED ORDER — WARFARIN SODIUM 7.5 MG PO TABS
15.0000 mg | ORAL_TABLET | Freq: Once | ORAL | Status: AC
  Administered 2012-04-04: 15 mg via ORAL
  Filled 2012-04-04: qty 2

## 2012-04-04 NOTE — Progress Notes (Addendum)
ANTIBIOTIC CONSULT NOTE - INITIAL  Pharmacy Consult for:  Vancomycin Indication:  Cellulitis of left upper arm and forearm  No Known Allergies  Patient Measurements: Height: 6' (182.9 cm) Weight: 175 lb 11.3 oz (79.7 kg) IBW/kg (Calculated) : 77.6   Vital Signs: Temp: 97.5 F (36.4 C) (08/22 1828) Temp src: Oral (08/22 1828) BP: 150/99 mmHg (08/22 1828) Pulse Rate: 62  (08/22 1828)  Labs:  Basename 04/04/12 1725 04/04/12 1707  WBC -- 7.4  HGB 14.6 13.2  PLT -- 342  LABCREA -- --  CREATININE 1.00 --   Estimated Creatinine Clearance: 89.5 ml/min (by C-G formula based on Cr of 1).  Microbiology: Blood cultures x 2 pending  Medical History: Past Medical History  Diagnosis Date  . History of CVA (cerebrovascular accident)     02/2011  . Hx of aortic valve replacement, mechanical   . H/O mitral valve replacement with mechanical valve   . HX: long term anticoagulant use     Medications:  Prescriptions prior to admission  Medication Sig Dispense Refill  . warfarin (COUMADIN) 5 MG tablet Take 15 mg by mouth daily.       . cephALEXin (KEFLEX) 500 MG capsule Take 1 capsule (500 mg total) by mouth 4 (four) times daily.  20 capsule  0  . oxyCODONE-acetaminophen (PERCOCET/ROXICET) 5-325 MG per tablet Take 1 tablet by mouth every 6 (six) hours as needed for pain.  10 tablet  0   Assessment:  Asked to assist with Vancomycin therapy for this 56 year-old male with worsening cellulitis of left arm.  ED visit on 04/02/12 with I&D of abscess left forearm.  Returned on 04/04/12 for wound check; failed to take Keflex prescribed at previous visit.   Goals of Therapy:   Vancomycin trough levels 10-15 mcg/ml  Eradication of infection  Plan:   Vancomycin 1000 mg IV every 12 hours  Levels as needed to guide dose selection  [employer].Ph. 04/04/2012 7:13 PM

## 2012-04-04 NOTE — Progress Notes (Signed)
[facility] ED CM reviewed EPIC notes Pt seen by [facility] staff on 04/02/12 Noted pt returned for wound check and noted EDP stated pt unable to fill antibiotic Rx from 04/02/12.  CM spoke with [facility] pharmacy staff pt is eligible for [facility] indigent medication assist (last assist date in 07/12 after [facility] hospitalization)

## 2012-04-04 NOTE — Progress Notes (Addendum)
ANTICOAGULATION CONSULT NOTE - Initial Consult  Pharmacy Consult for:  Coumadin Indication:   Prevention of systemic embolism due to mechanical heart valves  No Known Allergies  Patient Measurements: Height: 6' (182.9 cm) Weight: 175 lb 11.3 oz (79.7 kg) IBW/kg (Calculated) : 77.6   Vital Signs: Temp: 97.5 F (36.4 C) (08/22 1828) Temp src: Oral (08/22 1828) BP: 150/99 mmHg (08/22 1828) Pulse Rate: 62  (08/22 1828)  Labs:  Basename 04/04/12 1851 04/04/12 1725 04/04/12 1707  HGB -- 14.6 13.2  HCT -- 43.0 39.5  PLT -- -- 342  APTT -- -- --  LABPROT 20.9* -- --  INR 1.77* -- --  HEPARINUNFRC -- -- --  CREATININE -- 1.00 --  CKTOTAL -- -- --  CKMB -- -- --  TROPONINI -- -- --    Estimated Creatinine Clearance: 89.5 ml/min (by C-G formula based on Cr of 1).   Medical History: Past Medical History  Diagnosis Date  . History of CVA (cerebrovascular accident)     02/2011  . Hx of aortic valve replacement, mechanical   . H/O mitral valve replacement with mechanical valve   . HX: long term anticoagulant use     Medications:  Prescriptions prior to admission  Medication Sig Dispense Refill  . warfarin (COUMADIN) 5 MG tablet Take 15 mg by mouth daily.       . cephALEXin (KEFLEX) 500 MG capsule Take 1 capsule (500 mg total) by mouth 4 (four) times daily.  20 capsule  0  . oxyCODONE-acetaminophen (PERCOCET/ROXICET) 5-325 MG per tablet Take 1 tablet by mouth every 6 (six) hours as needed for pain.  10 tablet  0    Assessment:  Assisting with Coumadin therapy for this 56 year-old male, who is on chronic Coumadin due to presence of mechanical heart valves.  The usual home dose is documented as 15 mg daily.  History of cardioembolic right cerebellar infarct 02/2011 secondary to prosthetic valves and subtherapeutic INR.  The INR is subtherapeutic on admission at 1.77.  Goal of Therapy:   INR 2.5-3.5  Prevention of systemic embolism   Plan:   15 mg dose  tonight  Follow PT/INR daily  Recommend full-dose Lovenox while INR subtherapeutic.  Polo Riley R.Ph. 04/04/2012 7:52 PM   Addendum:  Received a new order for full-dose Lovenox to continue until the INR is above 2.5.  Plan:  Lovenox 80 mg every 12 hours.  CBC every 3 days while on Lovenox.  Polo Riley R.Ph. 04/04/2012 8:47 PM

## 2012-04-04 NOTE — ED Provider Notes (Signed)
Medical screening examination/treatment/procedure(s) were performed by non-physician practitioner and as supervising physician I was immediately available for consultation/collaboration.    Tyrion Glaude L Jonathon Tan, MD 04/04/12 2332 

## 2012-04-04 NOTE — ED Notes (Signed)
Report called to [place] [facility].

## 2012-04-04 NOTE — ED Notes (Signed)
Swelling and redness in l/arm  2 days post I and D

## 2012-04-04 NOTE — ED Provider Notes (Signed)
History     CSN: [id number]  Arrival date & time 04/04/12  1547   First MD Initiated Contact with Patient 04/04/12 1554      No chief complaint on file.   (Consider location/radiation/quality/duration/timing/severity/associated sxs/prior treatment) The history is provided by the patient.    56 y.o. male in no acute distress presenting for wound check to abscess and cellulitis to left forearm. Patient has been changing the dressing and the packing came out. Patient was written a prescription for Keflex and he has not filled it secondary to monetary concerns. Patient reports the swelling redness and pain are increasing. He denies fever nausea and vomiting.  No past medical history on file.  Past Surgical History  Procedure Date  . Cardiac valve replacement     No family history on file.  History  Substance Use Topics  . Smoking status: Current Everyday Smoker  . Smokeless tobacco: Not on file  . Alcohol Use: Yes      Review of Systems  Constitutional: Negative for fever.  Skin: Positive for rash and wound.  All other systems reviewed and are negative.    Allergies  Review of patient's allergies indicates no known allergies.  Home Medications   Current Outpatient Rx  Name Route Sig Dispense Refill  . WARFARIN SODIUM 5 MG PO TABS Oral Take 15 mg by mouth daily.     . CEPHALEXIN 500 MG PO CAPS Oral Take 1 capsule (500 mg total) by mouth 4 (four) times daily. 20 capsule 0  . OXYCODONE-ACETAMINOPHEN 5-325 MG PO TABS Oral Take 1 tablet by mouth every 6 (six) hours as needed for pain. 10 tablet 0    There were no vitals taken for this visit.  Physical Exam  Nursing note and vitals reviewed. Constitutional: He is oriented to person, place, and time. He appears well-developed and well-nourished. No distress.  HENT:  Head: Normocephalic.  Eyes: Conjunctivae and EOM are normal.  Cardiovascular: Normal rate, regular rhythm and intact distal pulses.   Pulmonary/Chest:  Effort normal and breath sounds normal.  Abdominal: Soft. Bowel sounds are normal.  Musculoskeletal: Normal range of motion.  Neurological: He is alert and oriented to person, place, and time.  Skin:       Patient has significant area of cellulitis engulfing his left forearm from wrist to just above the elbow. Radial pulses intact. Fluctuant abscess that has been opened and reclosed just distal to the olecranon process. Area of fluctuance is approximately 3 cm x 2 cm.   Patient also has 4 x 3 cm fluctuant abscess to left temporal region.  Psychiatric: He has a normal mood and affect.    ED Course  Procedures (including critical care time)  INCISION AND DRAINAGE Performed by: Wynetta Emery Consent: Verbal consent obtained. Risks and benefits: risks, benefits and alternatives were discussed Type: abscess  Body area: Left forearm  Anesthesia: local infiltration  Local anesthetic: lidocaine 2% without epinephrine  Anesthetic total: 5 ml  Complexity: complex Blunt dissection to break up loculations  Drainage: purulent  Drainage amount: 5   Packing material: 1/4 in iodoform gauze  Patient tolerance: Patient tolerated the procedure well with no immediate complications.     Labs Reviewed  CBC WITH DIFFERENTIAL - Abnormal; Notable for the following:    RBC 4.11 (*)     All other components within normal limits  POCT I-STAT, CHEM 8  CULTURE, BLOOD (ROUTINE X 2)  CULTURE, BLOOD (ROUTINE X 2)   No results found.  1. Cellulitis And Abscess Of Forearm   2. History of CVA (cerebrovascular accident)   3. Hx of aortic valve replacement, mechanical   4. H/O mitral valve replacement with mechanical valve   5. HX: long term anticoagulant use       MDM  Labs will be drawn an IV started patient will be given a gram of vancomycin IV. He will be admitted for significant cellulitis. Blood work pending.        Wynetta Emery, PA-C 04/04/12 1800

## 2012-04-04 NOTE — H&P (Signed)
[facility] History and Physical  Jeff [name] RUE:[id number] DOB: 1956/07/28 DOA: 04/04/2012  Referring physician: Wynetta Emery, PA-C  PCP: used to go to [facility]  Chief Complaint: left upper arm cellulitis  HPI:  Jeff [name] is a 56 year old African American male with past medical history of aortic/mitral valve replacement with a mechanical device. Patient had CVA last year attribute it to subtherapeutic INR.patient came in today because of left arm cellulitis. Patient symptoms started a few days ago with redness and swelling in his left arm, he was seen in the emergency department 2 days ago with incision and drainage of small abscess, patient was sent home on Keflex. Patient had misunderstanding and he told me that the year only Percocet and on antibiotics. So he was not taking any antibiotics since his discharge. He came back today with worsening of his cellulitis with the redness and swelling involving his upper arm. He denies fever, but has chills, denies sweating.  Review of Systems:  Constitutional: negative for anorexia, fevers and sweats Eyes: negative for irritation, redness and visual disturbance Ears, nose, mouth, throat, and face: negative for earaches, epistaxis, nasal congestion and sore throat Respiratory: negative for cough, dyspnea on exertion, sputum and wheezing Cardiovascular: negative for chest pain, dyspnea, lower extremity edema, orthopnea, palpitations and syncope Gastrointestinal: negative for abdominal pain, constipation, diarrhea, melena, nausea and vomiting Genitourinary: negative for dysuria, frequency and hematuria Hematologic/lymphatic: negative for bleeding, easy bruising and lymphadenopathy Musculoskeletal:negative for arthralgias, muscle weakness and stiff joints Neurological: negative for coordination problems, gait problems, headaches and weakness Endocrine: negative for diabetic symptoms including polydipsia, polyuria and weight  loss Allergic/Immunologic: negative for anaphylaxis, hay fever and urticaria  Past Medical History  Diagnosis Date  . History of CVA (cerebrovascular accident)     02/2011  . Hx of aortic valve replacement, mechanical   . H/O mitral valve replacement with mechanical valve   . HX: long term anticoagulant use    Past Surgical History  Procedure Date  . Cardiac valve replacement    Social History:  reports that he has been smoking.  He does not have any smokeless tobacco history on file. He reports that he drinks alcohol. He reports that he uses illicit drugs (Marijuana). Lives at home with his daughter.  No Known Allergies  Family History  Problem Relation Age of Onset  . Diabetes Mother     Died before her 52 birthday    Prior to Admission medications   Medication Sig Start Date End Date Taking? Authorizing Provider  warfarin (COUMADIN) 5 MG tablet Take 15 mg by mouth daily.    Yes Historical Provider, MD  cephALEXin (KEFLEX) 500 MG capsule Take 1 capsule (500 mg total) by mouth 4 (four) times daily. 04/02/12 04/12/12  Pixie Casino, PA-C  oxyCODONE-acetaminophen (PERCOCET/ROXICET) 5-325 MG per tablet Take 1 tablet by mouth every 6 (six) hours as needed for pain. 04/02/12 04/12/12  Pixie Casino, PA-C   Physical Exam: Filed Vitals:   04/04/12 1627  BP: 136/89  Pulse: 87  Temp: 98.6 F (37 C)  Resp: 18  Weight: 77.111 kg (170 lb)  SpO2: 100%  General appearance: alert, cooperative and no distress  Head: Normocephalic, without obvious abnormality, atraumatic  Eyes: conjunctivae/corneas clear. PERRL, EOM's intact. Fundi benign.  Nose: Nares normal. Septum midline. Mucosa normal. No drainage or sinus tenderness.  Throat: lips, mucosa, and tongue normal; teeth and gums normal  Neck: Supple, no masses, no cervical lymphadenopathy, no JVD appreciated, no meningeal signs Resp: clear to auscultation  bilaterally  Chest wall: no tenderness  Cardio: regular rate and rhythm, S1, S2 normal,  no murmur, click, rub or gallop  GI: soft, non-tender; bowel sounds normal; no masses, no organomegaly  Extremities: there is redness and swelling in the left arm/forearm, patient reported worsening since 2 days ago. Skin: Skin color, texture, turgor normal. No rashes or lesions Neurologic: Alert and oriented X 3, normal strength and tone. Normal symmetric reflexes. Normal coordination and gait  Labs on Admission:  Basic Metabolic Panel:  Lab 04/04/12 8413  NA 140  K 4.4  CL 105  CO2 --  GLUCOSE 93  BUN 18  CREATININE 1.00  CALCIUM --  MG --  PHOS --   Liver Function Tests: No results found for this basename: AST:5,ALT:5,ALKPHOS:5,BILITOT:5,PROT:5,ALBUMIN:5 in the last 168 hours No results found for this basename: LIPASE:5,AMYLASE:5 in the last 168 hours No results found for this basename: AMMONIA:5 in the last 168 hours CBC:  Lab 04/04/12 1725 04/04/12 1707  WBC -- 7.4  NEUTROABS -- 5.5  HGB 14.6 13.2  HCT 43.0 39.5  MCV -- 96.1  PLT -- 342   Cardiac Enzymes: No results found for this basename: CKTOTAL:5,CKMB:5,CKMBINDEX:5,TROPONINI:5 in the last 168 hours  BNP (last 3 results) No results found for this basename: PROBNP:3 in the last 8760 hours CBG: No results found for this basename: GLUCAP:5 in the last 168 hours  Radiological Exams on Admission: No results found.    Assessment/Plan Principal Problem:  *Cellulitis of left upper arm and forearm Active Problems:  History of CVA (cerebrovascular accident)  H/O mitral valve replacement with mechanical valve  HX: long term anticoagulant use   Cellulitis of left upper arm and forearm Patient did not get any antibiotics since 2 days ago when he had the incision and drainage in the emergency department, he moves his elbow joint without any pain indicating there is no involvement of the joint space. I will start patient on vancomycin, after blood cultures obtained. We will follow him clinically.  History of  CVA Secondary to subtherapeutic INR, I will ask the pharmacy she restart his Coumadin.  History of mitral/aortic valve replacement He is on chronic Coumadin, his INR target is 2.5-3.5, I placed a consultation for the pharmacy to dose the Coumadin.  Code Status: full code Family Communication: plan explained to the patient himself. Disposition Plan: med surge bed, likely to go home when he is medical stable.  Time spent: 50 minutes  [facility] A [facility] Pager [phone]  If 7PM-7AM, please contact night-coverage [email or web address] Password [facility] 04/04/2012, 5:56 PM

## 2012-04-05 LAB — CBC
Hemoglobin: 11.4 g/dL — ABNORMAL LOW (ref 13.0–17.0)
MCHC: 33.4 g/dL (ref 30.0–36.0)
Platelets: 294 10*3/uL (ref 150–400)
RBC: 3.59 MIL/uL — ABNORMAL LOW (ref 4.22–5.81)

## 2012-04-05 LAB — BASIC METABOLIC PANEL
BUN: 14 mg/dL (ref 6–23)
Calcium: 8.5 mg/dL (ref 8.4–10.5)
GFR calc Af Amer: >90 mL/min (ref >90)
GFR calc non Af Amer: >90 mL/min (ref >90)
Potassium: 3.9 mEq/L (ref 3.5–5.1)
Sodium: 137 mEq/L (ref 135–145)

## 2012-04-05 LAB — PROTIME-INR
INR: 1.9 — ABNORMAL HIGH (ref 0.00–1.49)
Prothrombin Time: 22.1 seconds — ABNORMAL HIGH (ref 11.6–15.2)

## 2012-04-05 MED ORDER — WARFARIN SODIUM 7.5 MG PO TABS
17.5000 mg | ORAL_TABLET | Freq: Once | ORAL | Status: AC
  Administered 2012-04-05: 17.5 mg via ORAL
  Filled 2012-04-05: qty 1

## 2012-04-05 MED ORDER — WARFARIN - PHARMACIST DOSING INPATIENT: Freq: Every day | Status: DC

## 2012-04-05 NOTE — Progress Notes (Signed)
[facility] PROGRESS NOTE  Chevon Laufer WUJ:[id number] DOB: 1956-08-07 DOA: 04/04/2012 PCP: No primary provider on file.  Assessment/Plan: Principal Problem:  *Cellulitis of left upper arm and forearm Active Problems:  History of CVA (cerebrovascular accident)  H/O mitral valve replacement with mechanical valve  HX: long term anticoagulant use   Cellulitis of left upper arm and forearm  -Left upper extremity cellulitis, he also has an abscess was drained on 04/02/2012. -Elevate his extremity, continue vancomycin for today. -Probably can be just chart on either doxycycline or clindamycin orally.  History of CVA  Secondary to subtherapeutic INR, I will ask the pharmacy she restart his Coumadin.   History of mitral/aortic valve replacement  He is on chronic Coumadin, his INR target is 2.5-3.5, I placed a consultation for the pharmacy to dose the Coumadin. INR today is 1.9   Code Status: Full code Family Communication:  Disposition Plan: Remains as inpatient   Brief narrative: 56 year old African American male with past medical history of aortic and mitral valve replacement with prosthetic mechanical valve. He came in to the hospital 3 days ago with left upper extremity cellulitis/abscess. The abscess was drained and patient was discharged home from the emergency department. Apparently he did not take any antibiotics since discharge and came back with worsening of his cellulitis.  Consultants:  None  Procedures:  None  Antibiotics:  Vancomycin started on 04/04/2012  HPI/Subjective: Feels better  Objective: Filed Vitals:   04/04/12 1627 04/04/12 1828 04/04/12 2230 04/05/12 0628  BP: 136/89 150/99 101/66 136/93  Pulse: 87 62 85 70  Temp: 98.6 F (37 C) 97.5 F (36.4 C) 98.3 F (36.8 C) 98.4 F (36.9 C)  TempSrc:  Oral Oral Oral  Resp: 18 18 18 18   Height:  6' (1.829 m)    Weight: 77.111 kg (170 lb) 79.7 kg (175 lb 11.3 oz)    SpO2: 100% 99% 97% 98%     Intake/Output Summary (Last 24 hours) at 04/05/12 1302 Last data filed at 04/05/12 0849  Gross per 24 hour  Intake 1886.67 ml  Output      0 ml  Net 1886.67 ml   Filed Weights   04/04/12 1627 04/04/12 1828  Weight: 77.111 kg (170 lb) 79.7 kg (175 lb 11.3 oz)    Exam: General: Alert and awake, oriented x3, not in any acute distress. HEENT: anicteric sclera, pupils reactive to light and accommodation, EOMI CVS: S1-S2 clear, no murmur rubs or gallops Chest: clear to auscultation bilaterally, no wheezing, rales or rhonchi Abdomen: soft nontender, nondistended, normal bowel sounds, no organomegaly Extremities: no cyanosis, clubbing or edema noted bilaterally Neuro: Cranial nerves II-XII intact, no focal neurological deficits  Data Reviewed: Basic Metabolic Panel:  Lab 04/05/12 9562 04/04/12 1725  NA 137 140  K 3.9 4.4  CL 104 105  CO2 25 --  GLUCOSE 86 93  BUN 14 18  CREATININE 0.96 1.00  CALCIUM 8.5 --  MG -- --  PHOS -- --   Liver Function Tests: No results found for this basename: AST:5,ALT:5,ALKPHOS:5,BILITOT:5,PROT:5,ALBUMIN:5 in the last 168 hours No results found for this basename: LIPASE:5,AMYLASE:5 in the last 168 hours No results found for this basename: AMMONIA:5 in the last 168 hours CBC:  Lab 04/05/12 0448 04/04/12 1725 04/04/12 1707  WBC 5.4 -- 7.4  NEUTROABS -- -- 5.5  HGB 11.4* 14.6 13.2  HCT 34.1* 43.0 39.5  MCV 95.0 -- 96.1  PLT 294 -- 342   Cardiac Enzymes: No results found for this basename:  CKTOTAL:5,CKMB:5,CKMBINDEX:5,TROPONINI:5 in the last 168 hours BNP (last 3 results) No results found for this basename: PROBNP:3 in the last 8760 hours CBG: No results found for this basename: GLUCAP:5 in the last 168 hours  Recent Results (from the past 240 hour(s))  CULTURE, BLOOD (ROUTINE X 2)     Status: Normal (Preliminary result)   Collection Time   04/04/12  5:07 PM      Component Value Range Status Comment   Specimen Description BLOOD LEFT  ARM   Final    Special Requests BOTTLES DRAWN AEROBIC AND ANAEROBIC 4CC EACH   Final    Culture  Setup Time 04/04/2012 22:34   Final    Culture     Final    Value:        BLOOD CULTURE RECEIVED NO GROWTH TO DATE CULTURE WILL BE HELD FOR 5 DAYS BEFORE ISSUING A FINAL NEGATIVE REPORT   Report Status PENDING   Incomplete   CULTURE, BLOOD (ROUTINE X 2)     Status: Normal (Preliminary result)   Collection Time   04/04/12  5:20 PM      Component Value Range Status Comment   Specimen Description BLOOD LEFT HAND   Final    Special Requests BOTTLES DRAWN AEROBIC ONLY 2 CC   Final    Culture  Setup Time 04/04/2012 22:34   Final    Culture     Final    Value:        BLOOD CULTURE RECEIVED NO GROWTH TO DATE CULTURE WILL BE HELD FOR 5 DAYS BEFORE ISSUING A FINAL NEGATIVE REPORT   Report Status PENDING   Incomplete      Studies: No results found.  Scheduled Meds:   . sodium chloride   Intravenous Once  . sodium chloride   Intravenous STAT  . enoxaparin (LOVENOX) injection  1 mg/kg Subcutaneous Q12H  . vancomycin  1,000 mg Intravenous NOW  . vancomycin  1,000 mg Intravenous Q12H  . warfarin  15 mg Oral Once  . warfarin  17.5 mg Oral ONCE-1800  . Warfarin - Pharmacist Dosing Inpatient   Does not apply q1800  . DISCONTD: clindamycin (CLEOCIN) IV  600 mg Intravenous Q6H  . DISCONTD: vancomycin  1,000 mg Intravenous Once  . DISCONTD: warfarin  15 mg Oral Daily   Continuous Infusions:   Principal Problem:  *Cellulitis of left upper arm and forearm Active Problems:  History of CVA (cerebrovascular accident)  H/O mitral valve replacement with mechanical valve  HX: long term anticoagulant use    Time spent: 35 minutes    Tarique Loveall A  [facility] Pager [phone] If 8PM-8AM, please contact night-coverage at [email or web address], password [facility] 04/05/2012, 1:02 PM  LOS: 1 day

## 2012-04-05 NOTE — Progress Notes (Signed)
Met with pt at bedside to discuss need for PCP as he is on Coumadin. Pt stated he was going to [facility] and had the orange card. I called the [facility] and they told me they only take pts who are residents of [place]. I made an appointment for him at [facility] for 8/26 @ 10:15. He is aware he will need to pay $50.00 copayment and stated he could afford to do this.  I also discussed affordability of prescriptions with the pt. Informed him that Coumadin is on the $4.00 list at [facility] and he stated that he would afford to pay that as well as pay for antibiotics as long as they are on the $4.00 list. Pt does qualify for indigent funds for medication assistance and knows it is only once every 12 months. Dr Arthor Captain was updated of above.

## 2012-04-05 NOTE — Progress Notes (Addendum)
ANTICOAGULATION CONSULT NOTE - Follow Up Consult  Pharmacy Consult for  Coumadin, Lovenox Indication: AVR and MVR ([employer] mechanical valves)  No Known Allergies  Patient Measurements: Height: 6' (182.9 cm) Weight: 175 lb 11.3 oz (79.7 kg) IBW/kg (Calculated) : 77.6   Vital Signs: Temp: 98.4 F (36.9 C) (08/23 0628) Temp src: Oral (08/23 0628) BP: 136/93 mmHg (08/23 0628) Pulse Rate: 70  (08/23 0628)  Labs:  Basename 04/05/12 0500 04/05/12 0448 04/04/12 1851 04/04/12 1725 04/04/12 1707  HGB -- 11.4* -- 14.6 --  HCT -- 34.1* -- 43.0 39.5  PLT -- 294 -- -- 342  APTT -- -- -- -- --  LABPROT 22.1* -- 20.9* -- --  INR 1.90* -- 1.77* -- --  HEPARINUNFRC -- -- -- -- --  CREATININE -- 0.96 -- 1.00 --  CKTOTAL -- -- -- -- --  CKMB -- -- -- -- --  TROPONINI -- -- -- -- --    Estimated Creatinine Clearance: 93.2 ml/min (by C-G formula based on Cr of 0.96).  Assessment:  55 yom on chronic coumadin (15mg /day) PTA for h/o MVR and AVR (mechanical valves).  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   On admit 8/22, INR 1.77, Coumadin resumed and Lovenox added until INR is at goal (goal 2.5 - 3.5)  INR 1.9 today, hgb dropped to 11.4, no bleeding.  Goal of Therapy:  INR 2.5 - 3.5  Monitor platelets by anticoagulation protocol: Yes   Plan:   Coumadin 17.5 mg po x 1 tonight  Continue Lovenox 80 mg sq q12h  Daily PT/INR and CBC q72 hours while on Lovenox.  Pharmacy will f/u (plan to d/c Lovenox when INR > 2.5)   Geoffry Paradise Thi 04/05/2012,9:39 AM

## 2012-04-06 LAB — PROTIME-INR: Prothrombin Time: 26 seconds — ABNORMAL HIGH (ref 11.6–15.2)

## 2012-04-06 MED ORDER — WARFARIN SODIUM 7.5 MG PO TABS
15.0000 mg | ORAL_TABLET | Freq: Once | ORAL | Status: AC
  Administered 2012-04-06: 15 mg via ORAL
  Filled 2012-04-06: qty 2

## 2012-04-06 MED ORDER — SULFAMETHOXAZOLE-TRIMETHOPRIM 800-160 MG PO TABS: 1.0000 | ORAL_TABLET | Freq: Two times a day (BID) | ORAL | Status: AC

## 2012-04-06 NOTE — Progress Notes (Signed)
ANTIBIOTIC CONSULT NOTE - INITIAL  Pharmacy Consult for:  Vancomycin Indication:  Cellulitis of left upper arm and forearm  No Known Allergies  Patient Measurements: Height: 6' (182.9 cm) Weight: 175 lb 11.3 oz (79.7 kg) IBW/kg (Calculated) : 77.6   Vital Signs: Temp: 97.9 F (36.6 C) (08/24 0627) Temp src: Oral (08/24 0627) BP: 141/83 mmHg (08/24 0627) Pulse Rate: 74  (08/24 0627)  Labs:  Basename 04/05/12 0448 04/04/12 1725 04/04/12 1707  WBC 5.4 -- 7.4  HGB 11.4* 14.6 13.2  PLT 294 -- 342  LABCREA -- -- --  CREATININE 0.96 1.00 --   Estimated Creatinine Clearance: 93.2 ml/min (by C-G formula based on Cr of 0.96).  Microbiology: Blood cultures x 2 pending  Medical History: Past Medical History  Diagnosis Date  . History of CVA (cerebrovascular accident)     02/2011  . Hx of aortic valve replacement, mechanical   . H/O mitral valve replacement with mechanical valve   . HX: long term anticoagulant use     Medications:  Prescriptions prior to admission  Medication Sig Dispense Refill  . warfarin (COUMADIN) 5 MG tablet Take 15 mg by mouth daily.       . cephALEXin (KEFLEX) 500 MG capsule Take 1 capsule (500 mg total) by mouth 4 (four) times daily.  20 capsule  0  . oxyCODONE-acetaminophen (PERCOCET/ROXICET) 5-325 MG per tablet Take 1 tablet by mouth every 6 (six) hours as needed for pain.  10 tablet  0   Assessment:  Asked to assist with Vancomycin therapy for this 56 year-old male with worsening cellulitis of left arm.  ED visit on 04/02/12 with I&D of abscess left forearm.  Returned on 04/04/12 for wound check; failed to take Keflex prescribed at previous visit.   Day 3 Vancomycin; improving. Plan to discharge on po Doxycycline or Clindamycin (doxycycline has potential to increase INR).  Goals of Therapy:   Vancomycin trough levels 10-15 mcg/ml  Eradication of infection  Plan:   Vancomycin 1000 mg IV every 12 hours  Trough level if needed to guide  dose selection  Otho Bellows PharmD Pager [phone] 04/06/2012 8:08 AM

## 2012-04-06 NOTE — Discharge Summary (Signed)
Physician Discharge Summary  Jeff [name] GNF:[id number] DOB: 23-Sep-1955 DOA: 04/04/2012  PCP: No primary provider on file.  Admit date: 04/04/2012 Discharge date: 04/06/2012  Recommendations for Outpatient Follow-up:  1. Followup on INR as outpatient.  Discharge Diagnoses:  Principal Problem:  *Cellulitis of left upper arm and forearm Active Problems:  History of CVA (cerebrovascular accident)  H/O mitral valve replacement with mechanical valve  HX: long term anticoagulant use   Discharge Condition: Stable  Diet recommendation: Regular  Filed Weights   04/04/12 1627 04/04/12 1828  Weight: 77.111 kg (170 lb) 79.7 kg (175 lb 11.3 oz)    History of present illness:  Jeff [name] is a 56 year old African American male with past medical history of aortic/mitral valve replacement with a mechanical device. Patient had CVA last year attribute it to subtherapeutic INR.patient came in today because of left arm cellulitis. Patient symptoms started a few days ago with redness and swelling in his left arm, he was seen in the emergency department 2 days ago with incision and drainage of small abscess, patient was sent home on Keflex. Patient had misunderstanding and he told me that the year only Percocet and on antibiotics. So he was not taking any antibiotics since his discharge. He came back today with worsening of his cellulitis with the redness and swelling involving his upper arm. He denies fever, but has chills, denies sweating.  Hospital Course:   1. Cellulitis of left upper arm and forearm: Patient initially presented to the hospital and on 04/02/2012 was cellulitis of the forearm and abscess. Abscess was drained and patient was sent home on Keflex, he came back because he is thought he couldn't afford antibiotics. He has more redness, swelling and warmth with the infection extended to around his upper arm. Patient admitted to the hospital started on vancomycin, his cellulitis improved  dramatically. Patient to take Bactrim for 10 more days.  2. Chronic anticoagulation: Patient is taking chronic Coumadin for aortic/mitral mechanical valve replacement. His INR target is 2.5-3.5. His INR today is 2.34. He is going home on Bactrim which is in tract with Coumadin, patient to followup on an as INR very closely. Next INR check should be on 8/26 at 8:15 with [facility].  3. History of CVA: Patient is on Coumadin, but was felt to be secondary to microemboli originated in his mechanical valves. At that time his INR was subtherapeutic.  4. History of mitral/aortic valve replacement: Mechanical valves, patient is on Coumadin. Continue outpatient Coumadin checks, has recommended INR to be between 2.5-3.5.  Procedures:  None  Consultations:  None  Discharge Exam: Filed Vitals:   04/06/12 1330  BP: 149/89  Pulse: 79  Temp: 97.8 F (36.6 C)  Resp: 18   Filed Vitals:   04/05/12 1911 04/05/12 2200 04/06/12 0627 04/06/12 1330  BP: 146/88 131/87 141/83 149/89  Pulse: 83 79 74 79  Temp: 98.3 F (36.8 C) 98.5 F (36.9 C) 97.9 F (36.6 C) 97.8 F (36.6 C)  TempSrc: Oral Oral Oral Oral  Resp: 18 18 20 18   Height:      Weight:      SpO2: 98% 100% 98% 99%   General: Alert and awake, oriented x3, not in any acute distress. HEENT: anicteric sclera, pupils reactive to light and accommodation, EOMI CVS: S1-S2 clear, no murmur rubs or gallops Chest: clear to auscultation bilaterally, no wheezing, rales or rhonchi Abdomen: soft nontender, nondistended, normal bowel sounds, no organomegaly Extremities: no cyanosis, clubbing or edema noted bilaterally Neuro:  Cranial nerves II-XII intact, no focal neurological deficits  Discharge Instructions   Medication List  As of 04/06/2012  3:40 PM   STOP taking these medications         cephALEXin 500 MG capsule         TAKE these medications         oxyCODONE-acetaminophen 5-325 MG per tablet   Commonly known as:  PERCOCET/ROXICET   Take 1 tablet by mouth every 6 (six) hours as needed for pain.      sulfamethoxazole-trimethoprim 800-160 MG per tablet   Commonly known as: BACTRIM DS,SEPTRA DS   Take 1 tablet by mouth 2 (two) times daily.      warfarin 5 MG tablet   Commonly known as: COUMADIN   Take 15 mg by mouth daily.              The results of significant diagnostics from this hospitalization (including imaging, microbiology, ancillary and laboratory) are listed below for reference.    Significant Diagnostic Studies: No results found.  Microbiology: Recent Results (from the past 240 hour(s))  CULTURE, BLOOD (ROUTINE X 2)     Status: Normal (Preliminary result)   Collection Time   04/04/12  5:07 PM      Component Value Range Status Comment   Specimen Description BLOOD LEFT ARM   Final    Special Requests BOTTLES DRAWN AEROBIC AND ANAEROBIC 4CC EACH   Final    Culture  Setup Time 04/04/2012 22:34   Final    Culture     Final    Value:        BLOOD CULTURE RECEIVED NO GROWTH TO DATE CULTURE WILL BE HELD FOR 5 DAYS BEFORE ISSUING A FINAL NEGATIVE REPORT   Report Status PENDING   Incomplete   CULTURE, BLOOD (ROUTINE X 2)     Status: Normal (Preliminary result)   Collection Time   04/04/12  5:20 PM      Component Value Range Status Comment   Specimen Description BLOOD LEFT HAND   Final    Special Requests BOTTLES DRAWN AEROBIC ONLY 2 CC   Final    Culture  Setup Time 04/04/2012 22:34   Final    Culture     Final    Value:        BLOOD CULTURE RECEIVED NO GROWTH TO DATE CULTURE WILL BE HELD FOR 5 DAYS BEFORE ISSUING A FINAL NEGATIVE REPORT   Report Status PENDING   Incomplete      Labs: Basic Metabolic Panel:  Lab 04/05/12 5284 04/04/12 1725  NA 137 140  K 3.9 4.4  CL 104 105  CO2 25 --  GLUCOSE 86 93  BUN 14 18  CREATININE 0.96 1.00  CALCIUM 8.5 --  MG -- --  PHOS -- --   Liver Function Tests: No results found for this basename:  AST:5,ALT:5,ALKPHOS:5,BILITOT:5,PROT:5,ALBUMIN:5 in the last 168 hours No results found for this basename: LIPASE:5,AMYLASE:5 in the last 168 hours No results found for this basename: AMMONIA:5 in the last 168 hours CBC:  Lab 04/05/12 0448 04/04/12 1725 04/04/12 1707  WBC 5.4 -- 7.4  NEUTROABS -- -- 5.5  HGB 11.4* 14.6 13.2  HCT 34.1* 43.0 39.5  MCV 95.0 -- 96.1  PLT 294 -- 342   Cardiac Enzymes: No results found for this basename: CKTOTAL:5,CKMB:5,CKMBINDEX:5,TROPONINI:5 in the last 168 hours BNP: BNP (last 3 results) No results found for this basename: PROBNP:3 in the last 8760 hours CBG: No results found for  this basename: GLUCAP:5 in the last 168 hours  Time coordinating discharge: 40 minutes  Signed:  Christian Treadway A  [facility] 04/06/2012, 3:40 PM

## 2012-04-06 NOTE — Progress Notes (Signed)
ANTICOAGULATION CONSULT NOTE - Follow Up Consult  Pharmacy Consult for  Coumadin, Lovenox Indication: AVR and MVR ([employer] mechanical valves)  No Known Allergies  Patient Measurements: Height: 6' (182.9 cm) Weight: 175 lb 11.3 oz (79.7 kg) IBW/kg (Calculated) : 77.6   Vital Signs: Temp: 97.9 F (36.6 C) (08/24 0627) Temp src: Oral (08/24 0627) BP: 141/83 mmHg (08/24 0627) Pulse Rate: 74  (08/24 0627)  Labs:  Jeff [name] 04/06/12 0546 04/05/12 0500 04/05/12 0448 04/04/12 1851 04/04/12 1725 04/04/12 1707  HGB -- -- 11.4* -- 14.6 --  HCT -- -- 34.1* -- 43.0 39.5  PLT -- -- 294 -- -- 342  APTT -- -- -- -- -- --  LABPROT 26.0* 22.1* -- 20.9* -- --  INR 2.34* 1.90* -- 1.77* -- --  HEPARINUNFRC -- -- -- -- -- --  CREATININE -- -- 0.96 -- 1.00 --  CKTOTAL -- -- -- -- -- --  CKMB -- -- -- -- -- --  TROPONINI -- -- -- -- -- --    Estimated Creatinine Clearance: 93.2 ml/min (by C-G formula based on Cr of 0.96).  Assessment:  55 yom on chronic coumadin (15mg /day) PTA for h/o MVR and AVR (mechanical valves).  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 On admit 8/22, INR 1.77, Coumadin resumed and Lovenox added until INR is at goal (goal 2.5 - 3.5)  INR 1.9 8/23, hgb dropped to 11.4, no bleeding.  INR increased to 2.34 this am, close to goal INR 2.5-3.5  Goal of Therapy:  INR 2.5 - 3.5  Monitor platelets by anticoagulation protocol: Yes   Plan:   Coumadin 15 mg po x 1 @12n  today  Continue Lovenox 80 mg sq q12h  Daily PT/INR and CBC q72 hours while on Lovenox.  Pharmacy will f/u (plan to d/c Lovenox when INR > 2.5)   Otho Bellows PharmD Pager [phone] 04/06/2012,8:02 AM

## 2012-04-10 LAB — CULTURE, BLOOD (ROUTINE X 2)

## 2012-04-11 LAB — CULTURE, BLOOD (ROUTINE X 2)

## 2012-10-24 ENCOUNTER — Emergency Department (INDEPENDENT_AMBULATORY_CARE_PROVIDER_SITE_OTHER)
Admission: EM | Admit: 2012-10-24 | Discharge: 2012-10-24 | Disposition: A | Discharge status: Home / Self Care | Payer: No Typology Code available for payment source | Source: Home / Self Care | Attending: Emergency Medicine | Admitting: Emergency Medicine

## 2012-10-24 ENCOUNTER — Encounter (HOSPITAL_COMMUNITY): Payer: Self-pay | Admitting: *Deleted

## 2012-10-24 DIAGNOSIS — Z9229 Personal history of other drug therapy: Secondary | ICD-10-CM

## 2012-10-24 MED ORDER — WARFARIN SODIUM 5 MG PO TABS: ORAL_TABLET | ORAL | Status: DC

## 2012-10-24 NOTE — ED Provider Notes (Signed)
History     CSN: [id number]  Arrival date & time 10/24/12  1525   First MD Initiated Contact with Patient 10/24/12 1608      Chief Complaint  Patient presents with  . Medication Refill    (Consider location/radiation/quality/duration/timing/severity/associated sxs/prior treatment) HPI Comments: Pt is out of coumadin.   Pt reports he was suppose to be seen at [facility] today but doctor was out.   Pt can not be seen until April.   Pt reports he has been out of coumdin for a month.   Pt has had mitral and aortic valve replacements.  No complaints  The history is provided by the patient. No language interpreter was used.    Past Medical History  Diagnosis Date  . History of CVA (cerebrovascular accident)     02/2011  . Hx of aortic valve replacement, mechanical   . H/O mitral valve replacement with mechanical valve   . HX: long term anticoagulant use     Past Surgical History  Procedure Laterality Date  . Cardiac valve replacement      Family History  Problem Relation Age of Onset  . Diabetes Mother     Died before her 47 birthday    History  Substance Use Topics  . Smoking status: Current Every Day Smoker -- 0.05 packs/day    Types: Cigarettes  . Smokeless tobacco: Not on file  . Alcohol Use: 8.4 oz/week    14 Cans of beer per week      Review of Systems  All other systems reviewed and are negative.    Allergies  Review of patient's allergies indicates no known allergies.  Home Medications   Current Outpatient Rx  Name  Route  Sig  Dispense  Refill  . warfarin (COUMADIN) 5 MG tablet   Oral   Take 15 mg by mouth daily.            BP 136/92  Pulse 86  Temp(Src) 97.9 F (36.6 C) (Oral)  Resp 20  SpO2 96%  Physical Exam  Constitutional: He is oriented to person, place, and time. He appears well-developed and well-nourished.  HENT:  Head: Normocephalic and atraumatic.  Cardiovascular: Normal rate and normal heart sounds.   Pulmonary/Chest:  Effort normal.  Musculoskeletal: Normal range of motion.  Neurological: He is alert and oriented to person, place, and time.  Skin: Skin is warm.  Psychiatric: He has a normal mood and affect.    ED Course  Procedures (including critical care time)  Labs Reviewed - No data to display No results found.   No diagnosis found.    MDM  Coumadin.   See your Physicain as scheduled        Elson Areas, PA-C 10/24/12 1756

## 2012-10-24 NOTE — ED Notes (Addendum)
Had appt. at [facility] today but the doctor got sick.  They told him to come here.  Needs coumadin refilled. C/o chest pressure for 2 days that comes and goes and lasts 5 minutes.  Has had 3-4 episodes.  No SOB with the pain.  No nausea or sweating.  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

## 2012-10-25 NOTE — ED Provider Notes (Signed)
Medical screening examination/treatment/procedure(s) were performed by non-physician practitioner and as supervising physician I was immediately available for consultation/collaboration.  Leslee Home, M.D.  Reuben Likes, MD 10/25/12 [phone]

## 2013-07-10 ENCOUNTER — Encounter (HOSPITAL_COMMUNITY): Payer: Self-pay | Admitting: Emergency Medicine

## 2013-07-10 ENCOUNTER — Inpatient Hospital Stay (HOSPITAL_COMMUNITY)
Admission: EM | Admit: 2013-07-10 | Discharge: 2013-07-16 | DRG: 064 | Disposition: A | Discharge status: Rehabilitation Facility | Payer: Medicaid Other | Attending: Internal Medicine | Admitting: Internal Medicine

## 2013-07-10 DIAGNOSIS — I6350 Cerebral infarction due to unspecified occlusion or stenosis of unspecified cerebral artery: Principal | ICD-10-CM | POA: Diagnosis present

## 2013-07-10 DIAGNOSIS — E860 Dehydration: Secondary | ICD-10-CM

## 2013-07-10 DIAGNOSIS — F17200 Nicotine dependence, unspecified, uncomplicated: Secondary | ICD-10-CM | POA: Diagnosis present

## 2013-07-10 DIAGNOSIS — R279 Unspecified lack of coordination: Secondary | ICD-10-CM | POA: Diagnosis present

## 2013-07-10 DIAGNOSIS — F1010 Alcohol abuse, uncomplicated: Secondary | ICD-10-CM | POA: Diagnosis present

## 2013-07-10 DIAGNOSIS — R791 Abnormal coagulation profile: Secondary | ICD-10-CM

## 2013-07-10 DIAGNOSIS — R1110 Vomiting, unspecified: Secondary | ICD-10-CM

## 2013-07-10 DIAGNOSIS — Z954 Presence of other heart-valve replacement: Secondary | ICD-10-CM

## 2013-07-10 DIAGNOSIS — E869 Volume depletion, unspecified: Secondary | ICD-10-CM

## 2013-07-10 DIAGNOSIS — I619 Nontraumatic intracerebral hemorrhage, unspecified: Secondary | ICD-10-CM | POA: Diagnosis not present

## 2013-07-10 DIAGNOSIS — F1210 Cannabis abuse, uncomplicated: Secondary | ICD-10-CM | POA: Diagnosis present

## 2013-07-10 DIAGNOSIS — G935 Compression of brain: Secondary | ICD-10-CM

## 2013-07-10 DIAGNOSIS — Z7901 Long term (current) use of anticoagulants: Secondary | ICD-10-CM

## 2013-07-10 DIAGNOSIS — Z8673 Personal history of transient ischemic attack (TIA), and cerebral infarction without residual deficits: Secondary | ICD-10-CM

## 2013-07-10 DIAGNOSIS — R270 Ataxia, unspecified: Secondary | ICD-10-CM

## 2013-07-10 DIAGNOSIS — Z952 Presence of prosthetic heart valve: Secondary | ICD-10-CM

## 2013-07-10 DIAGNOSIS — Z9229 Personal history of other drug therapy: Secondary | ICD-10-CM

## 2013-07-10 DIAGNOSIS — N179 Acute kidney failure, unspecified: Secondary | ICD-10-CM

## 2013-07-10 DIAGNOSIS — R112 Nausea with vomiting, unspecified: Secondary | ICD-10-CM

## 2013-07-10 DIAGNOSIS — G936 Cerebral edema: Secondary | ICD-10-CM

## 2013-07-10 DIAGNOSIS — I639 Cerebral infarction, unspecified: Secondary | ICD-10-CM

## 2013-07-10 LAB — CBC WITH DIFFERENTIAL/PLATELET
Eosinophils Absolute: 0.2 10*3/uL (ref 0.0–0.7)
Hemoglobin: 15.4 g/dL (ref 13.0–17.0)
Lymphs Abs: 2.2 10*3/uL (ref 0.7–4.0)
Monocytes Relative: 8 % (ref 3–12)
Neutro Abs: 6.7 10*3/uL (ref 1.7–7.7)
Neutrophils Relative %: 67 % (ref 43–77)
Platelets: 245 10*3/uL (ref 150–400)
RBC: 4.75 MIL/uL (ref 4.22–5.81)
WBC: 10 10*3/uL (ref 4.0–10.5)

## 2013-07-10 LAB — RAPID URINE DRUG SCREEN, HOSP PERFORMED
Amphetamines: NOT DETECTED
Barbiturates: NOT DETECTED
Benzodiazepines: NOT DETECTED
Cocaine: NOT DETECTED
Tetrahydrocannabinol: POSITIVE — AB

## 2013-07-10 LAB — COMPREHENSIVE METABOLIC PANEL
ALT: 19 U/L (ref 0–53)
Albumin: 3.5 g/dL (ref 3.5–5.2)
Alkaline Phosphatase: 66 U/L (ref 39–117)
BUN: 14 mg/dL (ref 6–23)
Chloride: 106 mEq/L (ref 96–112)
GFR calc Af Amer: 63 mL/min — ABNORMAL LOW (ref >90)
Glucose, Bld: 201 mg/dL — ABNORMAL HIGH (ref 70–99)
Potassium: 4.2 mEq/L (ref 3.5–5.1)
Sodium: 139 mEq/L (ref 135–145)
Total Bilirubin: 0.5 mg/dL (ref 0.3–1.2)
Total Protein: 6.3 g/dL (ref 6.0–8.3)

## 2013-07-10 LAB — URINALYSIS, ROUTINE W REFLEX MICROSCOPIC
Bilirubin Urine: NEGATIVE
Glucose, UA: 100 mg/dL — AB
Ketones, ur: NEGATIVE mg/dL
Protein, ur: 30 mg/dL — AB
pH: 5.5 (ref 5.0–8.0)

## 2013-07-10 LAB — TROPONIN I: Troponin I: <0.3 ng/mL (ref <0.30)

## 2013-07-10 LAB — LIPASE, BLOOD: Lipase: 21 U/L (ref 11–59)

## 2013-07-10 LAB — PROTIME-INR: Prothrombin Time: 12.6 seconds (ref 11.6–15.2)

## 2013-07-10 LAB — URINE MICROSCOPIC-ADD ON

## 2013-07-10 MED ORDER — ONDANSETRON HCL 4 MG/2ML IJ SOLN
4.0000 mg | INTRAMUSCULAR | Status: DC | PRN
  Administered 2013-07-11 (×2): 4 mg via INTRAVENOUS
  Filled 2013-07-10 (×2): qty 2

## 2013-07-10 MED ORDER — SODIUM CHLORIDE 0.9 % IJ SOLN
3.0000 mL | Freq: Two times a day (BID) | INTRAMUSCULAR | Status: DC
  Administered 2013-07-11 – 2013-07-15 (×8): 3 mL via INTRAVENOUS

## 2013-07-10 MED ORDER — SODIUM CHLORIDE 0.9 % IV SOLN
INTRAVENOUS | Status: DC
  Administered 2013-07-11 – 2013-07-16 (×10): via INTRAVENOUS

## 2013-07-10 MED ORDER — PANTOPRAZOLE SODIUM 40 MG IV SOLR
40.0000 mg | Freq: Two times a day (BID) | INTRAVENOUS | Status: DC
  Administered 2013-07-10 – 2013-07-13 (×6): 40 mg via INTRAVENOUS
  Filled 2013-07-10 (×8): qty 40

## 2013-07-10 MED ORDER — SODIUM CHLORIDE 0.9 % IV SOLN
INTRAVENOUS | Status: AC
  Administered 2013-07-10: 16:00:00 via INTRAVENOUS

## 2013-07-10 MED ORDER — ENOXAPARIN SODIUM 80 MG/0.8ML ~~LOC~~ SOLN
80.0000 mg | Freq: Two times a day (BID) | SUBCUTANEOUS | Status: DC
  Administered 2013-07-11 – 2013-07-13 (×4): 80 mg via SUBCUTANEOUS
  Filled 2013-07-10 (×7): qty 0.8

## 2013-07-10 MED ORDER — SODIUM CHLORIDE 0.9 % IV BOLUS (SEPSIS)
2000.0000 mL | Freq: Once | INTRAVENOUS | Status: AC
  Administered 2013-07-10: 2000 mL via INTRAVENOUS

## 2013-07-10 MED ORDER — WARFARIN SODIUM 10 MG PO TABS
10.0000 mg | ORAL_TABLET | Freq: Once | ORAL | Status: AC
  Administered 2013-07-10: 10 mg via ORAL
  Filled 2013-07-10: qty 1

## 2013-07-10 MED ORDER — ENOXAPARIN SODIUM 80 MG/0.8ML ~~LOC~~ SOLN
80.0000 mg | Freq: Once | SUBCUTANEOUS | Status: DC
  Filled 2013-07-10: qty 0.8

## 2013-07-10 MED ORDER — ONDANSETRON HCL 4 MG/2ML IJ SOLN
4.0000 mg | Freq: Once | INTRAMUSCULAR | Status: AC
  Administered 2013-07-10: 4 mg via INTRAVENOUS
  Filled 2013-07-10: qty 2

## 2013-07-10 MED ORDER — WARFARIN - PHARMACIST DOSING INPATIENT: Freq: Every day | Status: DC

## 2013-07-10 MED ORDER — ENOXAPARIN SODIUM 80 MG/0.8ML ~~LOC~~ SOLN
80.0000 mg | Freq: Once | SUBCUTANEOUS | Status: AC
  Administered 2013-07-10: 80 mg via SUBCUTANEOUS
  Filled 2013-07-10: qty 0.8

## 2013-07-10 MED ORDER — ACETAMINOPHEN 650 MG RE SUPP: 650.0000 mg | Freq: Four times a day (QID) | RECTAL | Status: DC | PRN

## 2013-07-10 MED ORDER — ACETAMINOPHEN 325 MG PO TABS
650.0000 mg | ORAL_TABLET | Freq: Four times a day (QID) | ORAL | Status: DC | PRN
  Administered 2013-07-11 – 2013-07-16 (×8): 650 mg via ORAL
  Filled 2013-07-10 (×8): qty 2

## 2013-07-10 NOTE — ED Notes (Signed)
attempted to get orthostatics on pt while standing pt stated he was to weak and dizzy to stand and sat back down

## 2013-07-10 NOTE — ED Notes (Signed)
Bed: WA02 Expected date:  Expected time:  Means of arrival:  Comments: n/v

## 2013-07-10 NOTE — ED Notes (Signed)
Per EMS: pt c/o n/v since last night. 20 g in left hand 4 mg Zofran, CBG 136. Denies any pain.

## 2013-07-10 NOTE — Progress Notes (Signed)
ANTICOAGULATION CONSULT NOTE - Initial Consult  Pharmacy Consult for warfarin and enoxaparin Indication: mechanical mitral valve  No Known Allergies  Vital Signs: Temp: 97.9 F (36.6 C) (11/27 1257) Temp src: Oral (11/27 1257) BP: 147/101 mmHg (11/27 1346) Pulse Rate: 84 (11/27 1346)  Labs:  Recent Labs  07/10/13 1300  HGB 15.4  HCT 44.3  PLT 245  LABPROT 12.6  INR 0.96  CREATININE 1.40*    Medical History: Past Medical History  Diagnosis Date  . History of CVA (cerebrovascular accident)     02/2011  . Hx of aortic valve replacement, mechanical   . H/O mitral valve replacement with mechanical valve   . HX: long term anticoagulant use     Assessment: 20 yoM admitted 11/27 with vomiting and weakness. Pt on chronic Coumadin with a Hx of mechanical mitral valve. Pharmacy has been consulted to manage this patient's Coumadin while in-patient and dose enoxaparin until INR is back into therapeutic range  Hgb 15.4, plt 245, no bleeding noted  Pt states he takes Coumadin 5mg  daily with last dose taken 11/26  Admit INR SUB-therapeutic at 0.96  It is unclear at resent where patient has INR checked  Weight documented as 79.7kg on 8/22  SCr 1.4, this appears above this patient's baseline, CrCl ~60 ml/min   Goal of Therapy:  Anti-Xa level 0.6-1.2 units/ml 4hrs after LMWH dose given INR 2.5-3.5 with mechanic mitral valve Monitor platelets by anticoagulation protocol: Yes   Plan:  - enoxaparin 1mg /kg q12h= 80mg  SQ BID until INR in therapeutic range - warfarin 10mg  x 1 tonight - daily PT/INR - CBC in AM and at least q72h - SCr qweek while on enoxaparin  Thank you for the consult.  Tomi Bamberger, PharmD, BCPS Clinical Pharmacist Pager: [phone] Pharmacy: [phone] 07/10/2013 2:44 PM

## 2013-07-10 NOTE — ED Provider Notes (Signed)
CSN: [id number]     Arrival date & time 07/10/13  1248 History   First MD Initiated Contact with Patient 07/10/13 1257     Chief Complaint  Patient presents with  . Nausea  . Emesis   (Consider location/radiation/quality/duration/timing/severity/associated sxs/prior Treatment) HPI Comments: Jeff [name] is a 57 y.o. male who presents for evaluation of vomiting, and weakness, by EMS. In onset. The symptoms after donating plasma and walking home. He was feeling well before he donated plasma. He's been taking his medications, as usual. No recent sick contacts. No other recent illnesses. There are no known modifying factors.  Patient is a 57 y.o. male presenting with vomiting. The history is provided by the patient, the spouse and the EMS personnel.  Emesis   Past Medical History  Diagnosis Date  . History of CVA (cerebrovascular accident)     02/2011  . Hx of aortic valve replacement, mechanical   . H/O mitral valve replacement with mechanical valve   . HX: long term anticoagulant use    Past Surgical History  Procedure Laterality Date  . Cardiac valve replacement     Family History  Problem Relation Age of Onset  . Diabetes Mother     Died before her 45 birthday   History  Substance Use Topics  . Smoking status: Current Every Day Smoker -- 0.05 packs/day    Types: Cigarettes  . Smokeless tobacco: Not on file  . Alcohol Use: 8.4 oz/week    14 Cans of beer per week    Review of Systems  Gastrointestinal: Positive for vomiting.  All other systems reviewed and are negative.    Allergies  Review of patient's allergies indicates no known allergies.  Home Medications   Current Outpatient Rx  Name  Route  Sig  Dispense  Refill  . warfarin (COUMADIN) 5 MG tablet   Oral   Take 5 mg by mouth daily.          BP 147/101  Pulse 84  Temp(Src) 97.9 F (36.6 C) (Oral)  Resp 16  SpO2 100% Physical Exam  Nursing note and vitals reviewed. Constitutional: He is oriented to  person, place, and time. He appears well-developed and well-nourished. He appears distressed (he appears uncomfortable).  HENT:  Head: Normocephalic and atraumatic.  Right Ear: External ear normal.  Left Ear: External ear normal.  Eyes: Conjunctivae and EOM are normal. Pupils are equal, round, and reactive to light.  Neck: Normal range of motion and phonation normal. Neck supple.  Cardiovascular: Normal rate, regular rhythm, normal heart sounds and intact distal pulses.   Pulmonary/Chest: Effort normal and breath sounds normal. He exhibits no bony tenderness.  Abdominal: Soft. Normal appearance and bowel sounds are normal. He exhibits no mass. There is no tenderness. There is no guarding.  Musculoskeletal: Normal range of motion.  Neurological: He is alert and oriented to person, place, and time. No cranial nerve deficit or sensory deficit. He exhibits normal muscle tone. Coordination normal.  Skin: Skin is warm, dry and intact.  Psychiatric: He has a normal mood and affect. His behavior is normal. Judgment and thought content normal.    ED Course  Procedures (including critical care time) Medications  sodium chloride 0.9 % bolus 2,000 mL (2,000 mLs Intravenous New Bag/Given 07/10/13 1317)  ondansetron (ZOFRAN) injection 4 mg (4 mg Intravenous Given 07/10/13 1319)    Patient Vitals for the past 24 hrs:  BP Temp Temp src Pulse Resp SpO2  07/10/13 1346 147/101 mmHg - -  84 - -  07/10/13 1345 144/97 mmHg - - 81 - -  07/10/13 1257 117/76 mmHg 97.9 F (36.6 C) Oral 86 16 100 %   2:29 PM Reevaluation with update and discussion. After initial assessment and  updated evaluation reveals he is more comfortable, and communicative now. He denies nausea, and was to try something to drink. Neurologic exam remains nonfocal and his blood pressure and heart rate are stable. He, states that his doctor changed his warfarin dose from 10 mg to 5 mg each day last week. I have ordered Lovenox and Coumadin to  protect against emboli formation and start warfarin anticoagulation since the INR is nontherapeutic. Yaniris Braddock L    2:31 PM-Consult complete with Dr. Gloris Manchester. Patient case explained and discussed. She agrees to admit patient for further evaluation and treatment. Call ended at 1507  Labs Review Labs Reviewed  COMPREHENSIVE METABOLIC PANEL - Abnormal; Notable for the following:    Glucose, Bld 201 (*)    Creatinine, Ser 1.40 (*)    GFR calc non Af Amer 54 (*)    GFR calc Af Amer 63 (*)    All other components within normal limits  URINALYSIS, ROUTINE W REFLEX MICROSCOPIC - Abnormal; Notable for the following:    Color, Urine AMBER (*)    APPearance CLOUDY (*)    Glucose, UA 100 (*)    Hgb urine dipstick SMALL (*)    Protein, ur 30 (*)    All other components within normal limits  URINE RAPID DRUG SCREEN (HOSP PERFORMED) - Abnormal; Notable for the following:    Tetrahydrocannabinol POSITIVE (*)    All other components within normal limits  URINE MICROSCOPIC-ADD ON - Abnormal; Notable for the following:    Casts HYALINE CASTS (*)    All other components within normal limits  CBC WITH DIFFERENTIAL  ETHANOL  PROTIME-INR   Imaging Review No results found.  EKG Interpretation   None       MDM   1. Nausea and vomiting   2. Dehydration   3. Subtherapeutic international normalized ratio (INR)      Vomiting, post plasma donation, with  Dehydration. Incidental subtherapeutic INR. He is at risk for emboli and stroke. Lovenox and Coumadin therapy has been ordered. The patient will need to be admitted for stabilization and treatment him under observation status.  Nursing Notes Reviewed/ Care Coordinated, and agree without changes. Applicable Imaging Reviewed.  Interpretation of Laboratory Data incorporated into ED treatment   Plan: Admit      Flint Melter, MD 07/10/13 [phone]

## 2013-07-10 NOTE — H&P (Signed)
[facility] History and Physical  Jeff [name] ZOX:[id number] DOB: 12-Mar-1956 DOA: 07/10/2013  Referring physician: Dr Effie Shy PCP: No primary provider on file.  Specialists: none  Chief Complaint: Nausea, vomiting  HPI: Jeff [name] is a 57 y.o. male past medical history significant for history of aortic /mitral valve replacement -mechanical valve who presents with above complaints. He states that he was doing well earlier today- he went and donated blood, walked back home and sometime after he got home he suddenly developed nausea vomiting-multiple episodes nonbloody. He denies abdominal pain, dysuria diarrhea fevers and no constipation. He states that he had not eaten before going to give the blood and all he had all day with some candy. He denies any sick contacts. He admits to alcohol use -since that he drinks about a quart of beer once a week. She was seen in the ED and urine drug screen was positive for marijuana, or analysis was unremarkable for infection and alcohol level less than 11. His INR was subtherapeutic at 0.96, and he states he has been compliant with his Coumadin taking 5 mg daily, Cr 1.4 his last creatinine was 0.96). He is admitted for further evaluation and management.   Review of Systems: The patient denies anorexia, fever, weight loss,, vision loss, decreased hearing, hoarseness, chest pain, syncope, dyspnea on exertion, peripheral edema, balance deficits, hemoptysis, abdominal pain, melena, hematochezia, severe indigestion/heartburn, hematuria, incontinence, genital sores, muscle weakness, transient blindness, difficulty walking, depression, unusual weight change, abnormal bleeding   Past Medical History  Diagnosis Date  . History of CVA (cerebrovascular accident)     02/2011  . Hx of aortic valve replacement, mechanical   . H/O mitral valve replacement with mechanical valve   . HX: long term anticoagulant use    Past Surgical History  Procedure Laterality Date  .  Cardiac valve replacement     Social History:  reports that he has been smoking Cigarettes.  He has been smoking about 0.05 packs per day. He does not have any smokeless tobacco history on file. He reports that he drinks about 8.4 ounces of alcohol per week. He reports that he uses illicit drugs (Marijuana) about 7 times per week. where does patient live--home  Can patient participate in ADLs-yes  No Known Allergies  Family History  Problem Relation Age of Onset  . Diabetes Mother     Died before her 86 birthday    Prior to Admission medications   Medication Sig Start Date End Date Taking? Authorizing Provider  warfarin (COUMADIN) 5 MG tablet Take 5 mg by mouth daily. 10/24/12  Yes Elson Areas, PA-C   Physical Exam: Filed Vitals:   07/10/13 1555  BP: 141/90  Pulse: 85  Temp: 98 F (36.7 C)  Resp: 18    Constitutional: Vital signs reviewed.  Patient is a well-developed and well-nourished  in no acute distress and cooperative with exam.  somnolent but easily aroused and oriented x3.  Head: Normocephalic and atraumatic Nose: No erythema or drainage noted.  Turbinates normal Mouth: no erythema or exudates, MMM Eyes: PERRL, EOMI, conjunctivae normal, No scleral icterus.  Neck: Supple, Trachea midline normal ROM, No JVD, mass, thyromegaly, or carotid bruit present.  Cardiovascular: RRR, S1 normal, S2 normal, no MRG, pulses symmetric and intact bilaterally Pulmonary/Chest: normal respiratory effort, CTAB, no wheezes, rales, or rhonchi Abdominal: Soft. Non-tender, non-distended, bowel sounds are normal, no masses, organomegaly, or guarding present.  GU: no CVA tenderness  Extremities: No cyanosis and no edema  Neurological: A&O  x3, Strength is normal and symmetric bilaterally, cranial nerve II-XII are grossly intact, no focal motor deficit, sensory intact to light touch bilaterally.  Skin: Warm, dry and intact. No rash, cyanosis, or clubbing.  Psychiatric: Normal mood and affect.  speech and behavior is normal. Judgment and thought content normal. Cognition and memory are normal.    Labs on Admission:  Basic Metabolic Panel:  Recent Labs Lab 07/10/13 1300  NA 139  K 4.2  CL 106  CO2 19  GLUCOSE 201*  BUN 14  CREATININE 1.40*  CALCIUM 9.0   Liver Function Tests:  Recent Labs Lab 07/10/13 1300  AST 37  ALT 19  ALKPHOS 66  BILITOT 0.5  PROT 6.3  ALBUMIN 3.5   No results found for this basename: LIPASE, AMYLASE,  in the last 168 hours No results found for this basename: AMMONIA,  in the last 168 hours CBC:  Recent Labs Lab 07/10/13 1300  WBC 10.0  NEUTROABS 6.7  HGB 15.4  HCT 44.3  MCV 93.3  PLT 245   Cardiac Enzymes: No results found for this basename: CKTOTAL, CKMB, CKMBINDEX, TROPONINI,  in the last 168 hours  BNP (last 3 results) No results found for this basename: PROBNP,  in the last 8760 hours CBG: No results found for this basename: GLUCAP,  in the last 168 hours  Radiological Exams on Admission: No results found.    Assessment/Plan Active Problems: Present on Admission:  . Nausea with vomiting -As discussed above, unclear etiology possibly vagal reaction to blood donation earlier today, will also obtain cardiac enzymes and lipase level to further evaluate  -Urinalysis is unremarkable for infection  -Supportive care-IV fluids antiemetics and follow.  Marland Kitchen AKI (acute kidney injury) -Secondary to above, hydrate follow recheck  . Volume depletion -Secondary to nausea vomiting above, hydrate follow recheck  . History of aortic/mitral valve replacement, mechanical with subtherapeutic INR -Resume Coumadin and bridge with Lovenox -Physical INR is 2.5-3.5, case manager to assist with possible home Lovenox in a.m. . History of CVA -Anticoagulation as above     Code Status: full Family Communication: none at beside  Disposition Plan: admit to tele  Time spent:   Kela Millin [facility] Pager [phone]  If  7PM-7AM, please contact night-coverage [email or web address] Password [facility] 07/10/2013, 5:27 PM

## 2013-07-11 DIAGNOSIS — E869 Volume depletion, unspecified: Secondary | ICD-10-CM

## 2013-07-11 LAB — COMPREHENSIVE METABOLIC PANEL
Albumin: 3.5 g/dL (ref 3.5–5.2)
BUN: 12 mg/dL (ref 6–23)
Chloride: 104 mEq/L (ref 96–112)
Creatinine, Ser: 1.11 mg/dL (ref 0.50–1.35)
GFR calc Af Amer: 82 mL/min — ABNORMAL LOW (ref >90)
GFR calc non Af Amer: 71 mL/min — ABNORMAL LOW (ref >90)
Glucose, Bld: 109 mg/dL — ABNORMAL HIGH (ref 70–99)
Total Bilirubin: 0.4 mg/dL (ref 0.3–1.2)
Total Protein: 6.5 g/dL (ref 6.0–8.3)

## 2013-07-11 LAB — CBC
MCH: 32.3 pg (ref 26.0–34.0)
MCHC: 34.1 g/dL (ref 30.0–36.0)
Platelets: 248 10*3/uL (ref 150–400)
RBC: 4.34 MIL/uL (ref 4.22–5.81)
RDW: 12.5 % (ref 11.5–15.5)

## 2013-07-11 LAB — TROPONIN I
Troponin I: <0.3 ng/mL (ref <0.30)
Troponin I: <0.3 ng/mL (ref <0.30)

## 2013-07-11 MED ORDER — WARFARIN - PHARMACIST DOSING INPATIENT: Freq: Every day | Status: DC

## 2013-07-11 MED ORDER — METOCLOPRAMIDE HCL 5 MG/ML IJ SOLN
5.0000 mg | Freq: Three times a day (TID) | INTRAMUSCULAR | Status: DC
  Administered 2013-07-11 – 2013-07-13 (×8): 5 mg via INTRAVENOUS
  Filled 2013-07-11 (×4): qty 1
  Filled 2013-07-11: qty 2
  Filled 2013-07-11: qty 1
  Filled 2013-07-11: qty 2
  Filled 2013-07-11 (×3): qty 1
  Filled 2013-07-11: qty 2
  Filled 2013-07-11: qty 1
  Filled 2013-07-11: qty 2
  Filled 2013-07-11 (×2): qty 1

## 2013-07-11 MED ORDER — WARFARIN SODIUM 10 MG PO TABS
10.0000 mg | ORAL_TABLET | Freq: Once | ORAL | Status: AC
  Administered 2013-07-11: 18:00:00 10 mg via ORAL
  Filled 2013-07-11: qty 1

## 2013-07-11 NOTE — Progress Notes (Signed)
[facility] PROGRESS NOTE  Jeff [name] GNF:[id number] DOB: 10-22-1955 DOA: 07/10/2013 PCP: No primary provider on file.  Assessment/Plan: . Nausea with vomiting  -As discussed above, unclear etiology possibly vagal reaction to blood donation earlier on 11/27 -Unclear etiology -Urinalysis is unremarkable for infection, cardiac enzymes negative and lipase within normal limits  -Will add Reglan, continue Supportive care-IV fluids antiemetics and follow.  Marland Kitchen AKI (acute kidney injury)  -Secondary to above, resolved with hydration  . Volume depletion  -Secondary to nausea vomiting above -Improved with hydration . History of aortic/mitral valve replacement, mechanical with subtherapeutic INR  -continue Coumadin and bridge with Lovenox  -goal INR is 2.5-3.5, INR is still 1.04 today -case manager to assist with possible home Lovenox when patient medically ready for discharge . History of CVA  -Anticoagulation as above   Code Status: Full Family Communication: None at bedside Disposition Plan: Pending PT eval   Consultants:  none  Procedures:  none  Antibiotics:  none  HPI/Subjective: Patient still with nausea or vomiting today after clear liquids this a.m.  Objective: Filed Vitals:   07/11/13 0552  BP: 150/93  Pulse: 83  Temp: 98.2 F (36.8 C)  Resp: 16    Intake/Output Summary (Last 24 hours) at 07/11/13 0954 Last data filed at 07/11/13 0900  Gross per 24 hour  Intake   2070 ml  Output   1120 ml  Net    950 ml   Filed Weights   07/10/13 1555  Weight: 81 kg (178 lb 9.2 oz)    Exam:  General: alert & oriented x In NAD Cardiovascular: RRR, nl S1 s2 Respiratory: CTAB Abdomen: soft +BS NT/ND, no masses palpable Extremities: No cyanosis and no edema   Data Reviewed: Basic Metabolic Panel:  Recent Labs Lab 07/10/13 1300 07/11/13 0550  NA 139 138  K 4.2 4.4  CL 106 104  CO2 19 23  GLUCOSE 201* 109*  BUN 14 12  CREATININE 1.40* 1.11  CALCIUM  9.0 8.8   Liver Function Tests:  Recent Labs Lab 07/10/13 1300 07/11/13 0550  AST 37 23  ALT 19 14  ALKPHOS 66 74  BILITOT 0.5 0.4  PROT 6.3 6.5  ALBUMIN 3.5 3.5    Recent Labs Lab 07/10/13 1807  LIPASE 21   No results found for this basename: AMMONIA,  in the last 168 hours CBC:  Recent Labs Lab 07/10/13 1300 07/11/13 0550  WBC 10.0 10.8*  NEUTROABS 6.7  --   HGB 15.4 14.0  HCT 44.3 41.1  MCV 93.3 94.7  PLT 245 248   Cardiac Enzymes:  Recent Labs Lab 07/10/13 1807 07/10/13 2345 07/11/13 0550  TROPONINI <0.30 <0.30 <0.30   BNP (last 3 results) No results found for this basename: PROBNP,  in the last 8760 hours CBG: No results found for this basename: GLUCAP,  in the last 168 hours  No results found for this or any previous visit (from the past 240 hour(s)).   Studies: No results found.  Scheduled Meds: . enoxaparin (LOVENOX) injection  80 mg Subcutaneous Q12H  . metoCLOPramide (REGLAN) injection  5 mg Intravenous TID AC & HS  . pantoprazole (PROTONIX) IV  40 mg Intravenous Q12H  . sodium chloride  3 mL Intravenous Q12H  . warfarin  10 mg Oral ONCE-1800  . Warfarin - Pharmacist Dosing Inpatient   Does not apply q1800   Continuous Infusions: . sodium chloride 100 mL/hr at 07/11/13 0216    Active Problems:   History of CVA (  cerebrovascular accident)   Hx of aortic valve replacement, mechanical   H/O mitral valve replacement with mechanical valve   Vomiting   Nausea with vomiting   Subtherapeutic international normalized ratio (INR)   AKI (acute kidney injury)   Volume depletion   Nausea & vomiting    Time spent: 35    Yassine Brunsman C  [facility] Pager [phone]. If 7PM-7AM, please contact night-coverage at [email or web address], password [facility] 07/11/2013, 9:54 AM  LOS: 1 day

## 2013-07-11 NOTE — Progress Notes (Signed)
ANTICOAGULATION CONSULT NOTE   Pharmacy Consult for warfarin (enoxaparin per MD) Indication: mechanical mitral valve  No Known Allergies  Vital Signs: Temp: 98.2 F (36.8 C) (11/28 0552) Temp src: Oral (11/28 0552) BP: 150/93 mmHg (11/28 0552) Pulse Rate: 83 (11/28 0552)  Labs:  Recent Labs  07/10/13 1300 07/10/13 1807 07/10/13 2345 07/11/13 0550  HGB 15.4  --   --  14.0  HCT 44.3  --   --  41.1  PLT 245  --   --  248  LABPROT 12.6  --   --  13.4  INR 0.96  --   --  1.04  CREATININE 1.40*  --   --  1.11  TROPONINI  --  <0.30 <0.30 <0.30    Medical History: Past Medical History  Diagnosis Date  . History of CVA (cerebrovascular accident)     02/2011  . Hx of aortic valve replacement, mechanical   . H/O mitral valve replacement with mechanical valve   . HX: long term anticoagulant use     Assessment: 60 yoM admitted 11/27 with vomiting and weakness. Pt on chronic Coumadin with a Hx of mechanical mitral valve. Pharmacy has been consulted to manage this patient's Coumadin while in-patient and dose enoxaparin until INR is back into therapeutic range  CBC WNL  Pt states he takes Coumadin 5mg  daily with last dose taken 11/26  Today's INR = 1.04 (admit INR = 0.96)  Weight documented as 81kg  SCr improved this am   Goal of Therapy:  Anti-Xa level 0.6-1.0 units/ml 4hrs after LMWH dose given INR = 2.5 - 3.5 for mechanical MVR Monitor platelets by anticoagulation protocol: Yes   Plan:  - Continue enoxaparin 1mg /kg q12h= 80mg  SQ BID until INR in therapeutic range - Repeat warfarin 10mg  x 1 tonight  - If discharged suggest 10mg  tonight then 7.5mg  daily until INR follow-up (Monday) - Daily PT/INR - CBC in AM and at least q72h - SCr qweek while on enoxaparin  Thank you for the consult.  Juliette Alcide, PharmD, BCPS.   Pager: [phone] 07/11/2013 8:21 AM

## 2013-07-11 NOTE — Progress Notes (Signed)
CARE MANAGEMENT NOTE 07/11/2013  Patient:  Jeff [name], Jeff [name]   Account Number:  [id number]  Date Initiated:  07/11/2013  Documentation initiated by:  DAVIS,RHONDA  Subjective/Objective Assessment:   nausea and vomiting unable to take meds     Action/Plan:   home   Anticipated DC Date:  07/13/2013   Anticipated DC Plan:  HOME/SELF CARE  In-house referral  Financial Counselor      DC Planning Services  CM consult  MATCH Program      Choice offered to / List presented to:             Status of service:  In process, will continue to follow Medicare Important Message given?   (If response is "NO", the following Medicare IM given date fields will be blank) Date Medicare IM given:   Date Additional Medicare IM given:    Discharge Disposition:    Per UR Regulation:  Reviewed for med. necessity/level of care/duration of stay  If discussed at Long Length of Stay Meetings, dates discussed:    Comments:  [id number] due to possible weekend dc match program information given to patient for Lovenox.

## 2013-07-12 ENCOUNTER — Inpatient Hospital Stay (HOSPITAL_COMMUNITY): Payer: Medicaid Other

## 2013-07-12 DIAGNOSIS — Z8673 Personal history of transient ischemic attack (TIA), and cerebral infarction without residual deficits: Secondary | ICD-10-CM

## 2013-07-12 DIAGNOSIS — R279 Unspecified lack of coordination: Secondary | ICD-10-CM

## 2013-07-12 DIAGNOSIS — I359 Nonrheumatic aortic valve disorder, unspecified: Secondary | ICD-10-CM

## 2013-07-12 DIAGNOSIS — R270 Ataxia, unspecified: Secondary | ICD-10-CM | POA: Diagnosis present

## 2013-07-12 LAB — BASIC METABOLIC PANEL
BUN: 10 mg/dL (ref 6–23)
CO2: 23 mEq/L (ref 19–32)
Chloride: 106 mEq/L (ref 96–112)
Creatinine, Ser: 1.13 mg/dL (ref 0.50–1.35)
GFR calc Af Amer: 80 mL/min — ABNORMAL LOW (ref >90)
Potassium: 4.1 mEq/L (ref 3.5–5.1)
Sodium: 139 mEq/L (ref 135–145)

## 2013-07-12 LAB — PROTIME-INR
INR: 1.55 — ABNORMAL HIGH (ref 0.00–1.49)
Prothrombin Time: 18.2 seconds — ABNORMAL HIGH (ref 11.6–15.2)

## 2013-07-12 LAB — HEMOGLOBIN A1C: Mean Plasma Glucose: 103 mg/dL (ref <117)

## 2013-07-12 MED ORDER — WARFARIN SODIUM 7.5 MG PO TABS
7.5000 mg | ORAL_TABLET | Freq: Once | ORAL | Status: AC
  Administered 2013-07-12: 7.5 mg via ORAL
  Filled 2013-07-12: qty 1

## 2013-07-12 MED ORDER — IOHEXOL 350 MG/ML SOLN
100.0000 mL | Freq: Once | INTRAVENOUS | Status: AC | PRN
  Administered 2013-07-12: 100 mL via INTRAVENOUS

## 2013-07-12 NOTE — Progress Notes (Signed)
[facility] PROGRESS NOTE  Jeff [name] ZOX:[id number] DOB: [birth year] DOA: 07/10/2013 PCP: No primary provider on file.  Assessment/Plan:  . Ataxia/probable CVA -CVA very likely in this patient as he does have a history of aortic/mitral valve replacement, prior history of CVA and was admitted with with subtherapeutic INR -Patient ataxic today with dizziness and when he stands -Will obtain MRI MRA carotid Doppler,  A1c, fasting lipid profile for CVA workup -Continue anticoagulation with Coumadin and Lovenox -PT OT consulted appreciate input -Stroke swallow screen -Follow and consult neuro pending MRI .Nausea with vomiting  -As discussed above, unclear etiology possibly vagal reaction to blood donation earlier on 11/27 -Unclear etiology -Urinalysis is unremarkable for infection, cardiac enzymes negative and lipase within normal limits  -continue Supportive care-IV fluids antiemetics and follow. -Likely due to #1  . AKI (acute kidney injury)  -Secondary to above, resolved with hydration  . Volume depletion  -Secondary to nausea vomiting above -Improved with hydration . History of aortic/mitral valve replacement, mechanical with subtherapeutic INR  -continue Coumadin and bridge with Lovenox  -goal INR is 2.5-3.5, INR is still 1.04 today -case manager to assist with possible home Lovenox when patient medically ready for discharge . History of CVA  -Anticoagulation as above   Code Status: Full Family Communication: None at bedside Disposition Plan: Pending PT eval   Consultants:  none  Procedures:  none  Antibiotics:  none  HPI/Subjective: Patient still with nausea or vomiting today after clear liquids this a.m.  Objective: Filed Vitals:   07/12/13 0554  BP: 136/89  Pulse: 79  Temp: 98.4 F (36.9 C)  Resp: 16    Intake/Output Summary (Last 24 hours) at 07/12/13 1116 Last data filed at 07/12/13 0128  Gross per 24 hour  Intake   1890 ml  Output   1050  ml  Net    840 ml   Filed Weights   07/10/13 1555  Weight: 81 kg (178 lb 9.2 oz)    Exam:  General: alert & oriented x In NAD Cardiovascular: RRR, nl S1 s2 Respiratory: CTAB Abdomen: soft +BS NT/ND, no masses palpable Extremities: No cyanosis and no edema  neuro- ataxic gait, strength 4-5/5 and symmetric, sensory grossly intact.   Data Reviewed: Basic Metabolic Panel:  Recent Labs Lab 07/10/13 1300 07/11/13 0550 07/12/13 0440  NA 139 138 139  K 4.2 4.4 4.1  CL 106 104 106  CO2 19 23 23   GLUCOSE 201* 109* 103*  BUN 14 12 10   CREATININE 1.40* 1.11 1.13  CALCIUM 9.0 8.8 8.6   Liver Function Tests:  Recent Labs Lab 07/10/13 1300 07/11/13 0550  AST 37 23  ALT 19 14  ALKPHOS 66 74  BILITOT 0.5 0.4  PROT 6.3 6.5  ALBUMIN 3.5 3.5    Recent Labs Lab 07/10/13 1807  LIPASE 21   No results found for this basename: AMMONIA,  in the last 168 hours CBC:  Recent Labs Lab 07/10/13 1300 07/11/13 0550  WBC 10.0 10.8*  NEUTROABS 6.7  --   HGB 15.4 14.0  HCT 44.3 41.1  MCV 93.3 94.7  PLT 245 248   Cardiac Enzymes:  Recent Labs Lab 07/10/13 1807 07/10/13 2345 07/11/13 0550  TROPONINI <0.30 <0.30 <0.30   BNP (last 3 results) No results found for this basename: PROBNP,  in the last 8760 hours CBG: No results found for this basename: GLUCAP,  in the last 168 hours  No results found for this or any previous visit (from the past  240 hour(s)).   Studies: No results found.  Scheduled Meds: . enoxaparin (LOVENOX) injection  80 mg Subcutaneous Q12H  . metoCLOPramide (REGLAN) injection  5 mg Intravenous TID AC & HS  . pantoprazole (PROTONIX) IV  40 mg Intravenous Q12H  . sodium chloride  3 mL Intravenous Q12H  . warfarin  7.5 mg Oral ONCE-1800  . Warfarin - Pharmacist Dosing Inpatient   Does not apply q1800   Continuous Infusions: . sodium chloride 100 mL/hr at 07/12/13 0546    Active Problems:   History of CVA (cerebrovascular accident)   Hx of  aortic valve replacement, mechanical   H/O mitral valve replacement with mechanical valve   Vomiting   Nausea with vomiting   Subtherapeutic international normalized ratio (INR)   AKI (acute kidney injury)   Volume depletion   Nausea & vomiting   Ataxia    Time spent: 35    [facility] C  [facility] Pager [phone]. If 7PM-7AM, please contact night-coverage at [email or web address], password [facility] 07/12/2013, 11:16 AM  LOS: 2 days

## 2013-07-12 NOTE — Progress Notes (Signed)
Pt passed bedside swallow screen.

## 2013-07-12 NOTE — Progress Notes (Signed)
  Echocardiogram 2D Echocardiogram has been performed.  Jeff [name] 07/12/2013, 12:54 PM

## 2013-07-12 NOTE — Progress Notes (Signed)
23:50--report called to stepdown--Megan RN [id number] at [facility] hospital. At 07/13/13 attempted to Notify the sister of the pt Jeff [name] at [phone] x 3 attempts without success. [facility] notified for transport at 00:25 on 07/13/13.At 1:00am-- Attempted to call pt's sister again at #[phone],this phone number the pt gave me, but again got a voice mail-left a long detailed msg about moving the pt to [facility] to stepdown central 2nd floor Rm #9, & gave my direct phone number several times for her to call me back about the pt's belongings that will be stored until the family can come & pick them up, also I updated contact info in the computer. [facility] also called about an emergent case & transport has been delayed at 1am. The pt's siter called & spoke to him directly on his room phone, & he told her he was moving to [place] due to stroke, & that they would need to come pick up his 3 bags of belongings of clothes. [facility] notified me at 02:05 that they would arrive in approximately . A bath was given to pt at 01:30am & discussed a condom cath due to a urinary incontinence event, & pt agreed to have condom cath applied,CNA applied a condom cath prior to transport to [facility] hospital. [facility] in to pick up pt for transfer to [place] at 02:25am on 07/13/13.

## 2013-07-12 NOTE — Progress Notes (Signed)
Bilateral carotid artery duplex:  1-39% ICA stenosis.  Vertebral artery flow is antegrade.     

## 2013-07-12 NOTE — Progress Notes (Signed)
ANTICOAGULATION CONSULT NOTE   Pharmacy Consult for warfarin (enoxaparin per MD) Indication: mechanical mitral valve  No Known Allergies  Vital Signs: Temp: 98.4 F (36.9 C) (11/29 0554) Temp src: Oral (11/29 0554) BP: 136/89 mmHg (11/29 0554) Pulse Rate: 79 (11/29 0554)  Labs:  Recent Labs  07/10/13 1300 07/10/13 1807 07/10/13 2345 07/11/13 0550 07/12/13 0440  HGB 15.4  --   --  14.0  --   HCT 44.3  --   --  41.1  --   PLT 245  --   --  248  --   LABPROT 12.6  --   --  13.4 18.2*  INR 0.96  --   --  1.04 1.55*  CREATININE 1.40*  --   --  1.11 1.13  TROPONINI  --  <0.30 <0.30 <0.30  --     Medical History: Past Medical History  Diagnosis Date  . History of CVA (cerebrovascular accident)     02/2011  . Hx of aortic valve replacement, mechanical   . H/O mitral valve replacement with mechanical valve   . HX: long term anticoagulant use     Assessment: 71 yoM admitted 11/27 with vomiting and weakness. Pt on chronic Coumadin with a Hx of mechanical mitral valve. Pharmacy has been consulted to manage this patient's Coumadin while in-patient.   Noted enoxaparin dosing by MD. 11/28 AM dose discontinued before being given, but all doses charted since then  CBC WNL, no bleeding noted  Pt states he takes Coumadin 5mg  daily with last dose taken 11/26  Today's INR = 1.55, up from 1.04 on 11/28 (admit INR = 0.96 on 11/27), after 10mg  x2 doses  Weight documented as 81kg  SCr stable at 1.1   Goal of Therapy:  Anti-Xa level 0.6-1.0 units/ml 4hrs after LMWH dose given INR = 2.5 - 3.5 for mechanical MVR Monitor platelets by anticoagulation protocol: Yes   Plan:  - Continue enoxaparin 1mg /kg q12h= 80mg  SQ BID per MD until INR in therapeutic range - Warfarin  7.5mg  x 1 tonight  - If discharged suggest 7.5mg  daily until INR follow-up (Monday) - Daily PT/INR - CBC in AM and at least q72h - SCr qweek while on enoxaparin  Thank you for the consult.  Tomi Bamberger, PharmD,  BCPS Clinical Pharmacist Pager: [phone] Pharmacy: [phone] 07/12/2013 7:49 AM

## 2013-07-12 NOTE — Progress Notes (Signed)
PT Cancellation Note  Patient Details Name: Jeff [name] MRN: [id number] DOB: [birth year]   Cancelled Treatment:    Reason Eval/Treat Not Completed: Medical issues which prohibited therapy (imaging ordered to r/u CVA). Will follow.    Ralene Bathe Kistler 07/12/2013, 11:26 AM [phone]

## 2013-07-12 NOTE — Evaluation (Signed)
Occupational Therapy Evaluation Patient Details Name: Elyas Villamor MRN: [id number] DOB: 08-14-1956 Today's Date: 07/12/2013 Time: 7829-5621 OT Time Calculation (min): 26 min  OT Assessment / Plan / Recommendation History of present illness 57 y.o. male past medical history significant for history of aortic /mitral valve replacement -mechanical valve who presents with nausea and vomitting. He states that he was doing well earlier today- he went and donated blood, walked back home and sometime after he got home he suddenly developed nausea vomiting-multiple episodes nonbloody. He denies abdominal pain, dysuria diarrhea fevers and no constipation. He states that he had not eaten before going to give the blood and all he had all day with some candy. He denies any sick contacts. He admits to alcohol use -since that he drinks about a quart of beer once a week. She was seen in the ED and urine drug screen was positive for marijuana, or analysis was unremarkable for infection and alcohol level less than 11. His INR was subtherapeutic at 0.96, and he states he has been compliant with his Coumadin taking 5 mg daily, Cr 1.4 his last creatinine was 0.96). He is admitted for further evaluation and management.   Clinical Impression   Pt admitted with above.  Pt presents to OT with Rt. UE dysmetria; Lt UE ataxia and truncal ataxia; impaired occulomotor function with nystagmus noted.  RN and MD made aware of above.  Pt appears with poor awareness of deficits as he doesn't mention difficulties with incoordination, or balance, but when prompted he states he was having difficulty with [facility] activities such as texting, etc.  Pt. With increased nausea, dizziness, and headache with position changes and visual testing.  Targets placed in room to aid in gaze stabilization with transfers to reduce above symptoms and pt instructed in use.   He will benefit from continued OT to maximize safety and independence with BADLs.  Recommend  CIR.     OT Assessment  Patient needs continued OT Services    Follow Up Recommendations  CIR;Supervision/Assistance - 24 hour    Barriers to Discharge Decreased caregiver support    Equipment Recommendations  None recommended by OT (TBD if d/c plan changes to home)    Recommendations for Other Services Rehab consult  Frequency  Min 3X/week    Precautions / Restrictions Precautions Precautions: Fall   Pertinent Vitals/Pain     ADL  Eating/Feeding: Modified independent Where Assessed - Eating/Feeding: Chair Grooming: Teeth care;Set up;Wash/dry face Where Assessed - Grooming: Supported sitting Upper Body Bathing: Set up Where Assessed - Upper Body Bathing: Supported sitting Lower Body Bathing: Moderate assistance Where Assessed - Lower Body Bathing: Supported sit to stand Upper Body Dressing: Minimal assistance Where Assessed - Upper Body Dressing: Unsupported sitting Lower Body Dressing: Moderate assistance Where Assessed - Lower Body Dressing: Supported sit to Pharmacist, hospital: Moderate assistance Toilet Transfer Method: Sit to stand;Stand pivot Toilet Transfer Equipment: Bedside commode Toileting - Clothing Manipulation and Hygiene: Moderate assistance Where Assessed - Toileting Clothing Manipulation and Hygiene: Standing Transfers/Ambulation Related to ADLs: mod A sit to stand.  Pt with truncal ataxia.  Pt with complaint of nausea with standing  ADL Comments: Pt able to don/doff socks.  Requires mod A due to balance in standing due to ataxia    OT Diagnosis: Ataxia;Disturbance of vision  OT Problem List: Impaired balance (sitting and/or standing);Impaired vision/perception;Decreased coordination;Decreased safety awareness;Decreased knowledge of use of DME or AE;Impaired UE functional use OT Treatment Interventions: Self-care/ADL training;Neuromuscular education;DME and/or AE instruction;Therapeutic activities;Visual/perceptual  remediation/compensation;Patient/family  education;Balance training   OT Goals(Current goals can be found in the care plan section) Acute Rehab OT Goals Patient Stated Goal: To get better OT Goal Formulation: With patient Time For Goal Achievement: 07/26/13 Potential to Achieve Goals: Good ADL Goals Pt Will Perform Grooming: with min assist;standing Pt Will Perform Lower Body Bathing: with min assist;sit to/from stand Pt Will Perform Upper Body Dressing: with set-up;sitting Pt Will Perform Lower Body Dressing: with min assist;sit to/from stand Pt Will Transfer to Toilet: with min assist;ambulating;regular height toilet;bedside commode;grab bars Pt Will Perform Toileting - Clothing Manipulation and hygiene: with min assist;sit to/from stand Additional ADL Goal #1: Pt will be independent with gaze stabilization to reduce nausea and dizziness when performing BADLs  Visit Information  Last OT Received On: 07/12/13 History of Present Illness: 57 y.o. male past medical history significant for history of aortic /mitral valve replacement -mechanical valve who presents with nausea and vomitting. He states that he was doing well earlier today- he went and donated blood, walked back home and sometime after he got home he suddenly developed nausea vomiting-multiple episodes nonbloody. He denies abdominal pain, dysuria diarrhea fevers and no constipation. He states that he had not eaten before going to give the blood and all he had all day with some candy. He denies any sick contacts. He admits to alcohol use -since that he drinks about a quart of beer once a week. She was seen in the ED and urine drug screen was positive for marijuana, or analysis was unremarkable for infection and alcohol level less than 11. His INR was subtherapeutic at 0.96, and he states he has been compliant with his Coumadin taking 5 mg daily, Cr 1.4 his last creatinine was 0.96). He is admitted for further evaluation and management.       Prior Functioning     Home  Living Family/patient expects to be discharged to:: Private residence Type of Home: House (duplex) Home Access: Level entry Home Layout: One level Home Equipment: None Prior Function Level of Independence: Independent Comments: Pt does not drive.  Pt ambulates frequently long distances Communication Communication: No difficulties Dominant Hand: Right         Vision/Perception Vision - History Baseline Vision: No visual deficits Patient Visual Report: No change from baseline Vision - Assessment Eye Alignment: Within Functional Limits Vision Assessment: Vision tested Ocular Range of Motion: Other (comment) (difficulty sustaining gaze on the Lt. ) Tracking/Visual Pursuits: Other (comment) (Lt horizontal nystabmus) Visual Fields: No apparent deficits Additional Comments: Pt with horizontal nystagmus worse to Lt;  Pt iniitally unable to track to far Lt (tracks ~3/4 of range), with repetition, he was able to track fully, but unable to sustain gaze but this also improved with repetition - ? if component of visual inattention. Pt. with similar symptoms to far Rt, but not as severe (Pt with c/o nausea with visual testing )   Cognition  Cognition Arousal/Alertness: Awake/alert Behavior During Therapy: WFL for tasks assessed/performed Overall Cognitive Status: No family/caregiver present to determine baseline cognitive functioning Area of Impairment: Attention;Awareness Current Attention Level: Sustained Safety/Judgement: Decreased awareness of deficits General Comments: Pt required mod verbal cues to maintain attention during visual assessment.  Pt. with obvious coordination deficits, but has not informed MD or nurses - ? awareness    Extremity/Trunk Assessment Upper Extremity Assessment Upper Extremity Assessment: RUE deficits/detail;LUE deficits/detail RUE Deficits / Details: Rt. UE with dysmetria.  Pt denies numbness and tingling  RUE Coordination: decreased fine motor;decreased  gross  motor LUE Deficits / Details: Pt with ataxia Lt UE; denies numbness or tingling.  Pt. with improved function if UE resting on armrest or table (closed chain) LUE Coordination: decreased fine motor;decreased gross motor (Pt reports inability to text yesterday) Lower Extremity Assessment Lower Extremity Assessment: Defer to PT evaluation Cervical / Trunk Assessment Cervical / Trunk Assessment: Other exceptions Cervical / Trunk Exceptions: truncal ataxia noted in standing.  Pt with rt. lateral flexion in standing and sitting during dynamic sitting tasks     Mobility Bed Mobility Bed Mobility: Not assessed Transfers Transfers: Sit to Stand;Stand to Sit Sit to Stand: 3: Mod assist;With upper extremity assist;From chair/3-in-1 Stand to Sit: 3: Mod assist;With upper extremity assist;To chair/3-in-1 Details for Transfer Assistance: Pt ataxic, requiring UE support and mod A to maintain balance.  Pt stood x ~1 min before abruptly sitting.  When questioned, pt states he was dizzy and had onset of nausea     Exercise     Balance Balance Balance Assessed: Yes Static Sitting Balance Static Sitting - Balance Support: Bilateral upper extremity supported Static Sitting - Level of Assistance: 5: Stand by assistance Dynamic Sitting Balance Dynamic Sitting - Balance Support: Feet supported;No upper extremity supported Dynamic Sitting - Level of Assistance: 4: Min assist Dynamic Sitting Balance - Compensations: with bil. UEs overhead, pt with truncal ataxia and falls to Rt.  Static Standing Balance Static Standing - Balance Support: Bilateral upper extremity supported Static Standing - Level of Assistance: 3: Mod assist Static Standing - Comment/# of Minutes: Pt with ataxia   End of Session OT - End of Session Activity Tolerance: Patient tolerated treatment well Patient left: in chair;with call bell/phone within reach Nurse Communication: Mobility status;Other (comment) (RN and MD informed of  ataxia)  GO     Anay Walter, Ursula Alert M 07/12/2013, 11:48 AM

## 2013-07-13 DIAGNOSIS — I639 Cerebral infarction, unspecified: Secondary | ICD-10-CM | POA: Diagnosis present

## 2013-07-13 LAB — MRSA PCR SCREENING: MRSA by PCR: NEGATIVE

## 2013-07-13 MED ORDER — HEPARIN (PORCINE) IN NACL 100-0.45 UNIT/ML-% IJ SOLN
950.0000 [IU]/h | INTRAMUSCULAR | Status: DC
  Administered 2013-07-14: 800 [IU]/h via INTRAVENOUS
  Administered 2013-07-16: 950 [IU]/h via INTRAVENOUS
  Filled 2013-07-13 (×4): qty 250

## 2013-07-13 MED ORDER — METOCLOPRAMIDE HCL 5 MG/ML IJ SOLN
5.0000 mg | Freq: Four times a day (QID) | INTRAMUSCULAR | Status: DC | PRN
  Administered 2013-07-13: 5 mg via INTRAVENOUS
  Filled 2013-07-13: qty 1

## 2013-07-13 MED ORDER — PANTOPRAZOLE SODIUM 40 MG PO TBEC
40.0000 mg | DELAYED_RELEASE_TABLET | Freq: Every day | ORAL | Status: DC
  Administered 2013-07-14: 40 mg via ORAL
  Filled 2013-07-13: qty 1

## 2013-07-13 MED ORDER — HEPARIN (PORCINE) IN NACL 100-0.45 UNIT/ML-% IJ SOLN
1100.0000 [IU]/h | INTRAMUSCULAR | Status: DC
  Administered 2013-07-13: 1100 [IU]/h via INTRAVENOUS
  Filled 2013-07-13: qty 250

## 2013-07-13 NOTE — Consult Note (Signed)
Reason for Consult: Cerebellar CVA Referring Physician: [name] is an 57 y.o. male.  HPI: Patient is a 57 year old individual who has had a previous right cerebellar stroke. He recently presented with a left cerebellar stroke. CT scan demonstrates a significant mass effect from edema in the left cerebellar hemisphere causing obliteration of the fourth ventricle. Clinically the patient has done fairly well noting that he has a good level of consciousness denies any significant headache or neck pain he does have some cerebellar signs noted by dysarthria and pass pointing but otherwise seems to be tolerating the stroke fairly well. The patient has been on Coumadin anticoagulation for mechanical heart valve. We've advised that this be changed to heparin if neurologic deterioration should occur and the patient developed acute hydrocephalus he'll need to have a ventriculostomy placed urgently if not emergently.  Past Medical History  Diagnosis Date  . History of CVA (cerebrovascular accident)     02/2011  . Hx of aortic valve replacement, mechanical   . H/O mitral valve replacement with mechanical valve   . HX: long term anticoagulant use     Past Surgical History  Procedure Laterality Date  . Cardiac valve replacement      Family History  Problem Relation Age of Onset  . Diabetes Mother     Died before her 12 birthday    Social History:  reports that he has been smoking Cigarettes.  He has been smoking about 0.05 packs per day. He does not have any smokeless tobacco history on file. He reports that he drinks about 8.4 ounces of alcohol per week. He reports that he uses illicit drugs (Marijuana) about 7 times per week.  Allergies: No Known Allergies  Medications: I have reviewed the patient's current medications.  Results for orders placed during the hospital encounter of 07/10/13 (from the past 48 hour(s))  PROTIME-INR     Status: Abnormal   Collection Time    07/12/13   4:40 AM      Result Value Range   Prothrombin Time 18.2 (*) 11.6 - 15.2 seconds   INR 1.55 (*) 0.00 - 1.49  BASIC METABOLIC PANEL     Status: Abnormal   Collection Time    07/12/13  4:40 AM      Result Value Range   Sodium 139  135 - 145 mEq/L   Potassium 4.1  3.5 - 5.1 mEq/L   Chloride 106  96 - 112 mEq/L   CO2 23  19 - 32 mEq/L   Glucose, Bld 103 (*) 70 - 99 mg/dL   BUN 10  6 - 23 mg/dL   Creatinine, Ser 4.13  0.50 - 1.35 mg/dL   Calcium 8.6  8.4 - 24.4 mg/dL   GFR calc non Af Amer 69 (*) >90 mL/min   GFR calc Af Amer 80 (*) >90 mL/min   Comment: (NOTE)     The eGFR has been calculated using the CKD EPI equation.     This calculation has not been validated in all clinical situations.     eGFR's persistently <90 mL/min signify possible Chronic Kidney     Disease.  HEMOGLOBIN A1C     Status: None   Collection Time    07/12/13 12:08 PM      Result Value Range   Hemoglobin A1C 5.2  <5.7 %   Comment: (NOTE)  According to the ADA Clinical Practice Recommendations for 2011, when     HbA1c is used as a screening test:      >=6.5%   Diagnostic of Diabetes Mellitus               (if abnormal result is confirmed)     5.7-6.4%   Increased risk of developing Diabetes Mellitus     References:Diagnosis and Classification of Diabetes Mellitus,Diabetes     Care,2011,34(Suppl 1):S62-S69 and Standards of Medical Care in             Diabetes - 2011,Diabetes Care,2011,34 (Suppl 1):S11-S61.   Mean Plasma Glucose 103  <117 mg/dL   Comment: Performed at [facility]  MRSA PCR SCREENING     Status: None   Collection Time    07/13/13  3:00 AM      Result Value Range   MRSA by PCR NEGATIVE  NEGATIVE   Comment:            The GeneXpert MRSA Assay (FDA     approved for NASAL specimens     only), is one component of a     comprehensive MRSA colonization     surveillance program. It is not     intended to  diagnose MRSA     infection nor to guide or     monitor treatment for     MRSA infections.    Ct Angio Head W/cm &/or Wo Cm  07/12/2013   CLINICAL DATA:  Ataxia, suspect cerebral vascular accident  EXAM: CT ANGIOGRAPHY HEAD  TECHNIQUE: Multidetector CT imaging of the head was performed using the standard protocol during bolus administration of intravenous contrast. Multiplanar CT image reconstructions including MIPs were obtained to evaluate the vascular anatomy.  CONTRAST:  OMNIPAQUE IOHEXOL 350 MG/ML SOLN  COMPARISON:  Prior study from 02/16/2011.  FINDINGS: Precontrast images of the brain demonstrating multiple cortical and subcortical wedge-shaped hypodensities involving both frontal lobes as well as the right frontoparietal region, not significantly changed relative to prior examination. Additional wedge-shaped hypodensity and encephalomalacia within the right cerebral hemisphere is compatible with remote infarct as well.  There is new vague hypodensity measuring 4.5 x 4.5 cm involving the left cerebellar hemisphere (series 5, image 24). Finding is compatible with acute ischemic infarct. There is localized mass effect with effacement of the 4th ventricle. The cerebellar vermis is involved on the left. Ventricles are stable in size without evidence of hydrocephalus. There is crowding of the basilar cisterns with the cerebellar tonsils low lying in the foramen magnum. No acute intracranial hemorrhage identified. No extra-axial fluid collection. No mass lesion identified.  Calvarium is intact. Lipoma within the left frontal scalp is unchanged. Orbital soft tissues are within normal limits. Paranasal sinuses and mastoid air cells are clear.  Post-contrast CTA images of the brain are somewhat limited due to timing of the contrast bolus.  The distal vertebral arteries are not well visualized. The vertebrobasilar junction and basilar artery are widely patent. No basilar tip aneurysm The posterior  cerebral arteries and superior cerebellar arteries are grossly patent. Anterior inferior cerebellar arteries are not well visualized, and are likely hypoplastic. The posterior cerebral arteries are grossly patent.  The visualized petrous, cavernous, and supra clinoid segments of the internal carotid arteries are widely patent bilaterally without high-grade stenosis. The A1 segments are symmetric and patent bilaterally. Anterior communicating artery is grossly normal. The A2 segments are widely patent. The middle cerebral arteries are well opacified without high-grade  stenosis or other abnormality. No intracranial aneurysm seen within the anterior circulation. Posterior communicating arteries are not well evaluated.  Review of the MIP images confirms the above findings.  IMPRESSION: 1. Ischemic left cerebellar infarct with involvement of the cerebellar vermis. There is secondary effacement of the 4th ventricle with basilar system crowding and inferior displacement of the cerebellar tonsils into the foramen magnum. No hydrocephalus at this time. No evidence of intracranial hemorrhage. 2. Similar appearance of remote ischemic infarcts involving the bilateral frontal lobes, right frontoparietal region, and right cerebellar hemisphere. 3. Somewhat limited CTA without evidence of high-grade stenosis, occlusion, or intracranial aneurysm. Critical Value/emergent results were called by telephone at the time of interpretation on 07/12/2013 at 10:49 PM to Dr.Tom Claiborne Billings , who verbally acknowledged these results.   Electronically Signed   By: Rise Mu M.D.   On: 07/12/2013 22:54    Review of Systems  Constitutional: Positive for malaise/fatigue.  Eyes: Positive for double vision.  Cardiovascular: Negative.   Gastrointestinal: Negative.   Genitourinary: Negative.   Musculoskeletal: Negative.   Skin: Negative.   Neurological: Positive for dizziness, tingling, sensory change, focal weakness and headaches.   Endo/Heme/Allergies:       On blood thinners for mechanical heart valve  Psychiatric/Behavioral: Negative.    Blood pressure 152/90, pulse 86, temperature 98.3 F (36.8 C), temperature source Oral, resp. rate 12, height 6' (1.829 m), weight 79.5 kg (175 lb 4.3 oz), SpO2 98.00%. Physical Exam  Constitutional: He appears well-developed and well-nourished.  HENT:  Head: Normocephalic and atraumatic.  Eyes: Conjunctivae and EOM are normal. Pupils are equal, round, and reactive to light.  Neck: Neck supple.  GI: Soft.  Neurological:  Alert awake oriented. Speech and very slightly dysarthric. Tongue and uvula protruded in the midline cranial nerves otherwise appear intact with pupils being 3 mm equal and reactive extraocular movements are full the face is symmetric. No evidence of a cortical drift is noted. Rapid alternating movements including finger-nose-finger and hand packs demonstrate pass pointing and over exaggeration.    Assessment/Plan: Left cerebellar hemisphere CVA with obliteration of fourth ventricle. No evidence of hydrocephalus at this time.  The patient's Coumadin has been advised to be stopped and the patient will be anticoagulated with heparin if necessary. This may need to be reversed rapidly if he develops clinical deterioration however at this time it appears that he is tolerating this situation quite well without any evidence of hydrocephalus.  Zariya Minner J 07/13/2013, 3:08 PM

## 2013-07-13 NOTE — Progress Notes (Signed)
[facility] Progress Note  TEAM 1 - Stepdown/ICU TEAM   Michelle Vanhise MWU:[id number] DOB: [birth year] DOA: 07/10/2013 PCP: No primary provider on file.  Admit HPI / Brief Narrative: 57 y.o. male w/ a past medical history significant for mechanical aortic + mitral valve replacement who presented with nausea and vomiting. He stated that he was doing well until he suddenly developed nausea/vomiting, having multiple episodes. He denied abdominal pain, dysuria, diarrhea, fevers, or constipation.  He denied any sick contacts.   In the ED a urine drug screen was positive for marijuana, and an alcohol level less than 11. His INR was subtherapeutic at 0.96, though he stated he had been compliant with his Coumadin taking 5 mg daily.   HPI/Subjective: Pt has no new complaints.  N/V have resolved.  Does report some intermittent balance issues.  Denies HA, drownziness, visual change, cp, sob, or abdom pain.    Assessment/Plan:  Large L acute cerebellar ischemic infarction with mass effect with effacement of fourth ventricle  No signs of hydrocephalus per imaging thus far - moved to 3100 per Neuro due to risk of acute decompensation requiring emergent neurosurgery  Acute kidney injury Resolved w/ volume expansion  DH / volume depletion Clinically resolved w/ IVF  S/P mechanical aortic and mitral valve replacments INR subtherapeutic at time of admit - now covering w/ IV heparin   Code Status: FULL Family Communication: no family present at time of exam Disposition Plan: Neruro ICU until clear neuro status stable (at risk for herniation or hydrocephalus)  Consultants: Neurology Neurosurgery  Procedures: B carotid dopplers - no signif stenosis  TTE - pending  Antibiotics: none  DVT prophylaxis: IV heparin  Objective: Blood pressure 138/89, pulse 82, temperature 98.3 F (36.8 C), temperature source Oral, resp. rate 11, height 6' (1.829 m), weight 79.5 kg (175 lb 4.3 oz),  SpO2 98.00%.  Intake/Output Summary (Last 24 hours) at 07/13/13 1644 Last data filed at 07/13/13 1300  Gross per 24 hour  Intake   2063 ml  Output   3130 ml  Net  -1067 ml   Exam: General: No acute respiratory distress Lungs: Clear to auscultation bilaterally without wheezes or crackles Cardiovascular: Regular rate and rhythm without murmur gallop or rub normal S1 and S2 Abdomen: Nontender, nondistended, soft, bowel sounds positive, no rebound, no ascites, no appreciable mass Extremities: No significant cyanosis, clubbing, or edema bilateral lower extremities  Data Reviewed: Basic Metabolic Panel:  Recent Labs Lab 07/10/13 1300 07/11/13 0550 07/12/13 0440  NA 139 138 139  K 4.2 4.4 4.1  CL 106 104 106  CO2 19 23 23   GLUCOSE 201* 109* 103*  BUN 14 12 10   CREATININE 1.40* 1.11 1.13  CALCIUM 9.0 8.8 8.6   Liver Function Tests:  Recent Labs Lab 07/10/13 1300 07/11/13 0550  AST 37 23  ALT 19 14  ALKPHOS 66 74  BILITOT 0.5 0.4  PROT 6.3 6.5  ALBUMIN 3.5 3.5    Recent Labs Lab 07/10/13 1807  LIPASE 21   CBC:  Recent Labs Lab 07/10/13 1300 07/11/13 0550  WBC 10.0 10.8*  NEUTROABS 6.7  --   HGB 15.4 14.0  HCT 44.3 41.1  MCV 93.3 94.7  PLT 245 248   Cardiac Enzymes:  Recent Labs Lab 07/10/13 1807 07/10/13 2345 07/11/13 0550  TROPONINI <0.30 <0.30 <0.30    Recent Results (from the past 240 hour(s))  MRSA PCR SCREENING     Status: None   Collection Time    07/13/13  3:00 AM      Result Value Range Status   MRSA by PCR NEGATIVE  NEGATIVE Final   Comment:            The GeneXpert MRSA Assay (FDA     approved for NASAL specimens     only), is one component of a     comprehensive MRSA colonization     surveillance program. It is not     intended to diagnose MRSA     infection nor to guide or     monitor treatment for     MRSA infections.     Studies:  Recent x-ray studies have been reviewed in detail by the Attending  Physician  Scheduled Meds:  Scheduled Meds: . metoCLOPramide (REGLAN) injection  5 mg Intravenous TID AC & HS  . pantoprazole (PROTONIX) IV  40 mg Intravenous Q12H  . sodium chloride  3 mL Intravenous Q12H    Time spent on care of this patient: 35 mins   [facility] T  [facility] Office  [phone] Pager - Text Page per Loretha Stapler as per below:  On-Call/Text Page:      [email or web address]      password [email or web address]  If 7PM-7AM, please contact night-coverage [email or web address] Password [email or web address] 07/13/2013, 4:44 PM   LOS: 3 days

## 2013-07-13 NOTE — Progress Notes (Signed)
Rehab Admissions Coordinator Note:  Patient was screened by Jeff [name] for appropriateness for an Inpatient Acute Rehab Consult.  At this time, we are recommending Inpatient Rehab consult.  Jeff [name] 07/13/2013, 9:28 AM  I can be reached at [phone].

## 2013-07-13 NOTE — Consult Note (Signed)
Referring Physician: Dr. Sharon Seller    Chief Complaint: Nausea, vomiting, ataxia.  HPI: Jeff [name] is an 57 y.o. male with a history of mitral and aortic heart valve replacements and chronic anticoagulation, and previous cerebral infarctions, admitted on 07/10/2013 with acute onset of nausea vomiting and ataxia. Patient was last known well at 3 PM on 07/10/2013. There is no previous history of stroke nor TIA. His INR was subtherapeutic on admission. He has since been given Lovenox and Coumadin increased. He has not experienced diplopia nor changes in speech. He has not attempted to walk since admission. Nausea and vomiting have subsided. CT scan of his head showed large left acute cerebellar ischemic infarction with mass effect with effacement of fourth ventricle as well as mild downward compression of the cerebellar tonsils. No signs of hydrocephalus were noted. Old right and left frontal infarctions are also noted. NIH stroke score was 2.  LSN: 3 PM on 07/10/2013 tPA Given: No: Beyond time under for treatment consideration. MRankin: 2  Past Medical History  Diagnosis Date  . History of CVA (cerebrovascular accident)     02/2011  . Hx of aortic valve replacement, mechanical   . H/O mitral valve replacement with mechanical valve   . HX: long term anticoagulant use     Family History  Problem Relation Age of Onset  . Diabetes Mother     Died before her 71 birthday     Medications: I have reviewed the patient's current medications.  ROS: History obtained from chart review and the patient  General ROS: negative for - chills, fatigue, fever, night sweats, weight gain or weight loss Psychological ROS: negative for - behavioral disorder, hallucinations, memory difficulties, mood swings or suicidal ideation Ophthalmic ROS: negative for - blurry vision, double vision, eye pain or loss of vision ENT ROS: negative for - epistaxis, nasal discharge, oral lesions, sore throat, tinnitus or  vertigo Allergy and Immunology ROS: negative for - hives or itchy/watery eyes Hematological and Lymphatic ROS: negative for - bleeding problems, bruising or swollen lymph nodes Endocrine ROS: negative for - galactorrhea, hair pattern changes, polydipsia/polyuria or temperature intolerance Respiratory ROS: negative for - cough, hemoptysis, shortness of breath or wheezing Cardiovascular ROS: negative for - chest pain, dyspnea on exertion, edema or irregular heartbeat Gastrointestinal ROS: negative for - abdominal pain, diarrhea, hematemesis, nausea/vomiting or stool incontinence Genito-Urinary ROS: negative for - dysuria, hematuria, incontinence or urinary frequency/urgency Musculoskeletal ROS: negative for - joint swelling or muscular weakness Neurological ROS: as noted in HPI Dermatological ROS: negative for rash and skin lesion changes  Physical Examination: Blood pressure 155/98, pulse 78, temperature 98.8 F (37.1 C), temperature source Oral, resp. rate 14, height 6' (1.829 m), weight 79.5 kg (175 lb 4.3 oz), SpO2 97.00%.  Neurologic Examination: Mental Status: Alert, oriented, thought content appropriate.  Speech fluent without evidence of aphasia. Able to follow commands without difficulty. Cranial Nerves: II-Visual fields were normal. III/IV/VI-Pupils were equal and reacted. Extraocular movements were full and conjugate; horizontal nystagmus noted on left lateral gaze.    V/VII-no facial numbness and no facial weakness. VIII-normal. X-normal speech and symmetrical palatal movement. Motor: 5/5 bilaterally with normal tone and bulk Sensory: Normal throughout. Deep Tendon Reflexes: 2+ and symmetric. Plantars: Flexor bilaterally Cerebellar: Moderate ataxia of left upper extremity and marked ataxia of left lower extremity; mild ataxia of right lower extremity with heel-to-shin testing. Carotid auscultation: Normal  Ct Angio Head W/cm &/or Wo Cm  07/12/2013   CLINICAL DATA:  Ataxia,  suspect cerebral  vascular accident  EXAM: CT ANGIOGRAPHY HEAD  TECHNIQUE: Multidetector CT imaging of the head was performed using the standard protocol during bolus administration of intravenous contrast. Multiplanar CT image reconstructions including MIPs were obtained to evaluate the vascular anatomy.  CONTRAST:  OMNIPAQUE IOHEXOL 350 MG/ML SOLN  COMPARISON:  Prior study from 02/16/2011.  FINDINGS: Precontrast images of the brain demonstrating multiple cortical and subcortical wedge-shaped hypodensities involving both frontal lobes as well as the right frontoparietal region, not significantly changed relative to prior examination. Additional wedge-shaped hypodensity and encephalomalacia within the right cerebral hemisphere is compatible with remote infarct as well.  There is new vague hypodensity measuring 4.5 x 4.5 cm involving the left cerebellar hemisphere (series 5, image 24). Finding is compatible with acute ischemic infarct. There is localized mass effect with effacement of the 4th ventricle. The cerebellar vermis is involved on the left. Ventricles are stable in size without evidence of hydrocephalus. There is crowding of the basilar cisterns with the cerebellar tonsils low lying in the foramen magnum. No acute intracranial hemorrhage identified. No extra-axial fluid collection. No mass lesion identified.  Calvarium is intact. Lipoma within the left frontal scalp is unchanged. Orbital soft tissues are within normal limits. Paranasal sinuses and mastoid air cells are clear.  Post-contrast CTA images of the brain are somewhat limited due to timing of the contrast bolus.  The distal vertebral arteries are not well visualized. The vertebrobasilar junction and basilar artery are widely patent. No basilar tip aneurysm The posterior cerebral arteries and superior cerebellar arteries are grossly patent. Anterior inferior cerebellar arteries are not well visualized, and are likely hypoplastic. The posterior  cerebral arteries are grossly patent.  The visualized petrous, cavernous, and supra clinoid segments of the internal carotid arteries are widely patent bilaterally without high-grade stenosis. The A1 segments are symmetric and patent bilaterally. Anterior communicating artery is grossly normal. The A2 segments are widely patent. The middle cerebral arteries are well opacified without high-grade stenosis or other abnormality. No intracranial aneurysm seen within the anterior circulation. Posterior communicating arteries are not well evaluated.  Review of the MIP images confirms the above findings.  IMPRESSION: 1. Ischemic left cerebellar infarct with involvement of the cerebellar vermis. There is secondary effacement of the 4th ventricle with basilar system crowding and inferior displacement of the cerebellar tonsils into the foramen magnum. No hydrocephalus at this time. No evidence of intracranial hemorrhage. 2. Similar appearance of remote ischemic infarcts involving the bilateral frontal lobes, right frontoparietal region, and right cerebellar hemisphere. 3. Somewhat limited CTA without evidence of high-grade stenosis, occlusion, or intracranial aneurysm. Critical Value/emergent results were called by telephone at the time of interpretation on 07/12/2013 at 10:49 PM to Dr.Tom Claiborne Billings , who verbally acknowledged these results.   Electronically Signed   By: Rise Mu M.D.   On: 07/12/2013 22:54    Assessment: 57 y.o. male 57 year old man with mitral and aortic valve replacements and subtherapeutic INR on Coumadin presenting with acute large left cerebellar ischemic infarction.  Stroke Risk Factors - Prosthetic heart valve replacements  Plan: 1. HgbA1c, fasting lipid panel 2. MRI, MRA  of the brain without contrast 3. PT consult, OT consult, Speech consult 4. Echocardiogram 5. Carotid dopplers 6. Prophylactic therapy-Anticoagulation: Coumadin 7. Risk factor modification 8. Telemetry  monitoring   C.R. Roseanne Reno, MD [facility] [phone]  07/13/2013, 6:54 AM

## 2013-07-13 NOTE — Evaluation (Signed)
Physical Therapy Evaluation Patient Details Name: Jeff [name] MRN: [id number] DOB: [birth year] Today's Date: 07/13/2013 Time: 8119-1478 PT Time Calculation (min): 21 min  PT Assessment / Plan / Recommendation History of Present Illness  57 y.o. male past medical history significant for history of aortic /mitral valve replacement -mechanical valve who presents with nausea and vomitting. He went and donated blood, walked back home and sometime after he got home he suddenly developed nausea vomiting-multiple episodes nonbloody. He denies abdominal pain, dysuria diarrhea fevers and no constipation. He states that he had not eaten before going to give the blood and all he had all day with some candy. He denies any sick contacts. He admits to alcohol use -since that he drinks about a quart of beer once a week. he was seen in the ED and urine drug screen was positive for marijuana, or analysis was unremarkable for infection and alcohol level less than 11. His INR was subtherapeutic at 0.96. CT showed left cerebellar CVA  Clinical Impression  Pt very pleasant and eager to return to independence to keep his 2 and 4yo grandkids on the weekend. Pt with bil LE and truncal ataxia with dizziness with mobility but pt reports improved from yesterday and increased improvement with gaze stabilization. Pt will benefit from acute therapy to maximize mobility, balance, gait and function to decrease burden of care.     PT Assessment  Patient needs continued PT services    Follow Up Recommendations  CIR    Does the patient have the potential to tolerate intense rehabilitation      Barriers to Discharge Decreased caregiver support      Equipment Recommendations  Rolling walker with 5" wheels    Recommendations for Other Services Rehab consult   Frequency Min 4X/week    Precautions / Restrictions Precautions Precautions: Fall Restrictions Weight Bearing Restrictions: No   Pertinent Vitals/Pain No  pain 95HR sats 97% on RA      Mobility  Bed Mobility Bed Mobility: Supine to Sit Supine to Sit: 6: Modified independent (Device/Increase time);With rails;HOB flat Details for Bed Mobility Assistance: with use of rail no difficulties Transfers Transfers: Sit to Stand;Stand to Sit;Stand Pivot Transfers Sit to Stand: From bed;From chair/3-in-1;3: Mod assist Stand to Sit: To chair/3-in-1;3: Mod assist Details for Transfer Assistance: cueing for hand placement and assist for truncal control with transfers with use of gaze stabilzation throughout Ambulation/Gait Ambulation/Gait Assistance: 3: Mod assist Ambulation Distance (Feet): 80 Feet Assistive device: Rolling walker Ambulation/Gait Assistance Details: cueing for position inRW and gaze stabilization with assist to maintain contact with RW on the ground and for balance Gait Pattern: Ataxic;Wide base of support Gait velocity: decreased Stairs: No Modified Rankin (Stroke Patients Only) Pre-Morbid Rankin Score: No symptoms Modified Rankin: Moderately severe disability    Exercises     PT Diagnosis: Abnormality of gait  PT Problem List: Decreased cognition;Decreased knowledge of use of DME;Decreased activity tolerance;Decreased safety awareness;Decreased balance;Decreased mobility;Decreased coordination PT Treatment Interventions: Gait training;Functional mobility training;Therapeutic activities;Therapeutic exercise;Patient/family education;Balance training;DME instruction     PT Goals(Current goals can be found in the care plan section) Acute Rehab PT Goals Patient Stated Goal: be able to go home and take care of my grandkids on the weekend PT Goal Formulation: With patient Time For Goal Achievement: 07/27/13 Potential to Achieve Goals: Good  Visit Information  Last PT Received On: 07/13/13 Assistance Needed: +1 History of Present Illness: 57 y.o. male past medical history significant for history of aortic /mitral valve  replacement -mechanical  valve who presents with nausea and vomitting. He went and donated blood, walked back home and sometime after he got home he suddenly developed nausea vomiting-multiple episodes nonbloody. He denies abdominal pain, dysuria diarrhea fevers and no constipation. He states that he had not eaten before going to give the blood and all he had all day with some candy. He denies any sick contacts. He admits to alcohol use -since that he drinks about a quart of beer once a week. he was seen in the ED and urine drug screen was positive for marijuana, or analysis was unremarkable for infection and alcohol level less than 11. His INR was subtherapeutic at 0.96. CT showed left cerebellar CVA       Prior Functioning  Home Living Family/patient expects to be discharged to:: Private residence Living Arrangements: Alone Available Help at Discharge: Neighbor;Available 24 hours/day Type of Home: House Home Access: Level entry Home Layout: One level Home Equipment: None Prior Function Level of Independence: Independent Comments: Pt does not drive.  Pt ambulates frequently long distances. does not work, sister takes pt to grocery store Communication Communication: No difficulties Dominant Hand: Right    Cognition  Cognition Arousal/Alertness: Awake/alert Behavior During Therapy: WFL for tasks assessed/performed Area of Impairment: Attention;Safety/judgement Current Attention Level: Sustained Safety/Judgement: Decreased awareness of deficits    Extremity/Trunk Assessment Upper Extremity Assessment Upper Extremity Assessment: Defer to OT evaluation Lower Extremity Assessment Lower Extremity Assessment: RLE deficits/detail;LLE deficits/detail RLE Deficits / Details: strength 5/5 hip flexion, knee flexion and dorsiflexion. Pt with dysmetria and dysdiadochokinesia RLE Coordination: decreased gross motor;decreased fine motor LLE Deficits / Details: strength 5/5 hip flexion,4/5 knee  flexion and 5/5 dorsiflexion Pt with dysmetria and dysdiadochokinesia LLE Coordination: decreased fine motor;decreased gross motor Cervical / Trunk Assessment Cervical / Trunk Assessment: Other exceptions Cervical / Trunk Exceptions: truncal ataxia noted in sitting and standing with pt swaying forward and back in sitting with difficulty controlling trunk   Balance Balance Balance Assessed: Yes Static Sitting Balance Static Sitting - Balance Support: Bilateral upper extremity supported Static Sitting - Level of Assistance: 5: Stand by assistance Static Sitting - Comment/# of Minutes: 4 Static Standing Balance Static Standing - Balance Support: Bilateral upper extremity supported Static Standing - Level of Assistance: 3: Mod assist Static Standing - Comment/# of Minutes: 2  End of Session PT - End of Session Equipment Utilized During Treatment: Gait belt Activity Tolerance: Patient tolerated treatment well Patient left: in chair;with call bell/phone within reach;with family/visitor present;with nursing/sitter in room Nurse Communication: Mobility status  GP     Delorse Lek 07/13/2013, 10:24 AM Delaney Meigs, PT [phone]

## 2013-07-13 NOTE — Progress Notes (Signed)
ANTICOAGULATION CONSULT NOTE - Initial Consult  Pharmacy Consult for Heparin Indication: mechanical MVR and AVR  No Known Allergies  Patient Measurements: Height: 6' (182.9 cm) Weight: 175 lb 4.3 oz (79.5 kg) IBW/kg (Calculated) : 77.6  Vital Signs: Temp: 98.7 F (37.1 C) (11/30 0800) Temp src: Oral (11/30 0800) BP: 137/81 mmHg (11/30 0800) Pulse Rate: 97 (11/30 0700)  Labs:  Recent Labs  07/10/13 1300 07/10/13 1807 07/10/13 2345 07/11/13 0550 07/12/13 0440  HGB 15.4  --   --  14.0  --   HCT 44.3  --   --  41.1  --   PLT 245  --   --  248  --   LABPROT 12.6  --   --  13.4 18.2*  INR 0.96  --   --  1.04 1.55*  CREATININE 1.40*  --   --  1.11 1.13  TROPONINI  --  <0.30 <0.30 <0.30  --     Estimated Creatinine Clearance: 77.3 ml/min (by C-G formula based on Cr of 1.13).   Medical History: Past Medical History  Diagnosis Date  . History of CVA (cerebrovascular accident)     02/2011  . Hx of aortic valve replacement, mechanical   . H/O mitral valve replacement with mechanical valve   . HX: long term anticoagulant use     Medications:  Prescriptions prior to admission  Medication Sig Dispense Refill  . warfarin (COUMADIN) 5 MG tablet Take 5 mg by mouth daily.        Assessment: 57 y.o. male admitted 11/27 to [facility] with N/V/ataxia. 11/29 CT scan of head showed large L cerebellar ischemic infarction. Also noted multiple old bihemispheric infarcts. Pt on coumadin PTA for h/o mechanical AVR and MVR, CVAs. Baseline INR subtherapeutic (0.96) - ?compliance. INR up to 1.55 on 11/29 past 2 doses of coumadin. Pt also on lovenox bridge - 80mg  given 0630 this a.m. Pt transferred to [facility] for stroke. Holding coumadin now in case surgery needed. Changing lovenox to heparin without bolus in new stroke pt with mechanical valves.  Goal of Therapy:  Heparin level 0.3-0.5 units/ml Monitor platelets by anticoagulation protocol: Yes   Plan:  1) Begin heparin 1100 units/hr at  1400 (~8 hours post lovenox dose). No bolus  2) Will f/u 8 hr heparin level 3) Will f/u daily heparin level and CBC  Christoper Fabian, PharmD, BCPS Clinical pharmacist, pager [phone] 07/13/2013,10:03 AM

## 2013-07-13 NOTE — Progress Notes (Signed)
Stroke Team Progress Note  HISTORY  Jeff [name] is an 57 y.o. male with a history of mitral and aortic heart valve replacements and chronic anticoagulation, and previous cerebral infarctions, admitted on 07/10/2013 with acute onset of nausea vomiting and ataxia. Patient was last known well at 3 PM on 07/10/2013. There is no previous history of stroke nor TIA. His INR was subtherapeutic on admission. He has since been given Lovenox and Coumadin increased. He has not experienced diplopia nor changes in speech. He has not attempted to walk since admission. Nausea and vomiting have subsided. CT scan of his head showed large left acute cerebellar ischemic infarction with mass effect with effacement of fourth ventricle as well as mild downward compression of the cerebellar tonsils. No signs of hydrocephalus were noted. Old right and left frontal infarctions are also noted. NIH stroke score was 2.   Patient was not a TPA candidate secondary to duration of symptoms.   SUBJECTIVE No family is at bedside. The patient denies any headache at this point, and no further N/V.  OBJECTIVE Most recent Vital Signs: Filed Vitals:   07/13/13 0305 07/13/13 0400 07/13/13 0700 07/13/13 0800  BP: 163/99 155/98 156/98 137/81  Pulse: 79 78 97   Temp:    98.7 F (37.1 C)  TempSrc:    Oral  Resp: 15 14 17 11   Height:      Weight:      SpO2: 100% 97% 92% 100%   CBG (last 3)  No results found for this basename: GLUCAP,  in the last 72 hours  IV Fluid Intake:   . sodium chloride 100 mL/hr at 07/13/13 0146    MEDICATIONS  . enoxaparin (LOVENOX) injection  80 mg Subcutaneous Q12H  . metoCLOPramide (REGLAN) injection  5 mg Intravenous TID AC & HS  . pantoprazole (PROTONIX) IV  40 mg Intravenous Q12H  . sodium chloride  3 mL Intravenous Q12H  . Warfarin - Pharmacist Dosing Inpatient   Does not apply q1800   PRN:  acetaminophen, acetaminophen, ondansetron (ZOFRAN) IV  Diet:  General thin liquids Activity:   Bedrest DVT Prophylaxis:  coumadin  CLINICALLY SIGNIFICANT STUDIES Basic Metabolic Panel:  Recent Labs Lab 07/11/13 0550 07/12/13 0440  NA 138 139  K 4.4 4.1  CL 104 106  CO2 23 23  GLUCOSE 109* 103*  BUN 12 10  CREATININE 1.11 1.13  CALCIUM 8.8 8.6   Liver Function Tests:  Recent Labs Lab 07/10/13 1300 07/11/13 0550  AST 37 23  ALT 19 14  ALKPHOS 66 74  BILITOT 0.5 0.4  PROT 6.3 6.5  ALBUMIN 3.5 3.5   CBC:  Recent Labs Lab 07/10/13 1300 07/11/13 0550  WBC 10.0 10.8*  NEUTROABS 6.7  --   HGB 15.4 14.0  HCT 44.3 41.1  MCV 93.3 94.7  PLT 245 248   Coagulation:  Recent Labs Lab 07/10/13 1300 07/11/13 0550 07/12/13 0440  LABPROT 12.6 13.4 18.2*  INR 0.96 1.04 1.55*   Cardiac Enzymes:  Recent Labs Lab 07/10/13 1807 07/10/13 2345 07/11/13 0550  TROPONINI <0.30 <0.30 <0.30   Urinalysis:  Recent Labs Lab 07/10/13 1401  COLORURINE AMBER*  LABSPEC 1.022  PHURINE 5.5  GLUCOSEU 100*  HGBUR SMALL*  BILIRUBINUR NEGATIVE  KETONESUR NEGATIVE  PROTEINUR 30*  UROBILINOGEN 1.0  NITRITE NEGATIVE  LEUKOCYTESUR NEGATIVE   Lipid Panel    Component Value Date/Time   CHOL 139 02/16/2011 0550   TRIG 57 02/16/2011 0550   HDL 46 02/16/2011 0550  CHOLHDL 3.0 02/16/2011 0550   VLDL 11 02/16/2011 0550   LDLCALC 82 02/16/2011 0550   HgbA1C  Lab Results  Component Value Date   HGBA1C 5.2 07/12/2013    Urine Drug Screen:     Component Value Date/Time   LABOPIA NONE DETECTED 07/10/2013 1401   COCAINSCRNUR NONE DETECTED 07/10/2013 1401   LABBENZ NONE DETECTED 07/10/2013 1401   AMPHETMU NONE DETECTED 07/10/2013 1401   THCU POSITIVE* 07/10/2013 1401   LABBARB NONE DETECTED 07/10/2013 1401    Alcohol Level:  Recent Labs Lab 07/10/13 1300  ETH <11    Ct Angio Head W/cm &/or Wo Cm  07/12/2013   CLINICAL DATA:  Ataxia, suspect cerebral vascular accident  EXAM: CT ANGIOGRAPHY HEAD  TECHNIQUE: Multidetector CT imaging of the head was performed using the standard  protocol during bolus administration of intravenous contrast. Multiplanar CT image reconstructions including MIPs were obtained to evaluate the vascular anatomy.  CONTRAST:  OMNIPAQUE IOHEXOL 350 MG/ML SOLN  COMPARISON:  Prior study from 02/16/2011.  FINDINGS: Precontrast images of the brain demonstrating multiple cortical and subcortical wedge-shaped hypodensities involving both frontal lobes as well as the right frontoparietal region, not significantly changed relative to prior examination. Additional wedge-shaped hypodensity and encephalomalacia within the right cerebral hemisphere is compatible with remote infarct as well.  There is new vague hypodensity measuring 4.5 x 4.5 cm involving the left cerebellar hemisphere (series 5, image 24). Finding is compatible with acute ischemic infarct. There is localized mass effect with effacement of the 4th ventricle. The cerebellar vermis is involved on the left. Ventricles are stable in size without evidence of hydrocephalus. There is crowding of the basilar cisterns with the cerebellar tonsils low lying in the foramen magnum. No acute intracranial hemorrhage identified. No extra-axial fluid collection. No mass lesion identified.  Calvarium is intact. Lipoma within the left frontal scalp is unchanged. Orbital soft tissues are within normal limits. Paranasal sinuses and mastoid air cells are clear.  Post-contrast CTA images of the brain are somewhat limited due to timing of the contrast bolus.  The distal vertebral arteries are not well visualized. The vertebrobasilar junction and basilar artery are widely patent. No basilar tip aneurysm The posterior cerebral arteries and superior cerebellar arteries are grossly patent. Anterior inferior cerebellar arteries are not well visualized, and are likely hypoplastic. The posterior cerebral arteries are grossly patent.  The visualized petrous, cavernous, and supra clinoid segments of the internal carotid arteries are widely  patent bilaterally without high-grade stenosis. The A1 segments are symmetric and patent bilaterally. Anterior communicating artery is grossly normal. The A2 segments are widely patent. The middle cerebral arteries are well opacified without high-grade stenosis or other abnormality. No intracranial aneurysm seen within the anterior circulation. Posterior communicating arteries are not well evaluated.  Review of the MIP images confirms the above findings.  IMPRESSION: 1. Ischemic left cerebellar infarct with involvement of the cerebellar vermis. There is secondary effacement of the 4th ventricle with basilar system crowding and inferior displacement of the cerebellar tonsils into the foramen magnum. No hydrocephalus at this time. No evidence of intracranial hemorrhage. 2. Similar appearance of remote ischemic infarcts involving the bilateral frontal lobes, right frontoparietal region, and right cerebellar hemisphere. 3. Somewhat limited CTA without evidence of high-grade stenosis, occlusion, or intracranial aneurysm. Critical Value/emergent results were called by telephone at the time of interpretation on 07/12/2013 at 10:49 PM to Dr.Tom Claiborne Billings , who verbally acknowledged these results.   Electronically Signed   By: Rise Mu  M.D.   On: 07/12/2013 22:54    CT of the brain   IMPRESSION:  1. Ischemic left cerebellar infarct with involvement of the  cerebellar vermis. There is secondary effacement of the 4th  ventricle with basilar system crowding and inferior displacement of  the cerebellar tonsils into the foramen magnum. No hydrocephalus at  this time. No evidence of intracranial hemorrhage.  2. Similar appearance of remote ischemic infarcts involving the  bilateral frontal lobes, right frontoparietal region, and right  cerebellar hemisphere.  3. Somewhat limited CTA without evidence of high-grade stenosis,  occlusion, or intracranial aneurysm.  MRI of the brain    MRA of the brain     2D Echocardiogram   Study Conclusions  - Left ventricle: The cavity size was normal. Wall thickness was increased in a pattern of mild LVH. Systolic function was normal. The estimated ejection fraction was in the range of 55% to 60%. Wall motion was normal; there were no regional wall motion abnormalities. - Aortic valve: A mechanical prosthesis was present. There was mild stenosis. Trivial regurgitation. Valve area: 1.07cm^2(VTI). Valve area: 0.85cm^2 (Vmax). - Aortic root: The aortic root was mildly dilated. - Ascending aorta: The ascending aorta was severely dilated. - Mitral valve: A mechanical prosthesis was present. Valve area by pressure half-time: 1.86cm^2. Valve area by continuity equation (using LVOT flow): 1.25cm^2. - Left atrium: The atrium was moderately dilated. - Right atrium: The atrium was mildly dilated. Impressions:  - Oscillating density in LV cavity most likely residual MV apparatus (unchanged comared to previous study). Ascending aorta appears to be severely dilated; suggest CTA to further assess. Compared to 02/16/11, MV and AV gradients have slightly increased.   Carotid Doppler   Bilateral carotid artery duplex: 1-39% ICA stenosis. Vertebral artery flow is antegrade.   CXR    EKG  .   Therapy Recommendations pending  Physical Exam  General: The patient is alert and cooperative at the time of the examination.  Skin: No significant peripheral edema is noted.   Neurologic Exam  Mental status: The patient is oriented x 3.  Cranial nerves: Facial symmetry is present. Speech is slightly dysarthric. Extraocular movements are full. Visual fields are full.  Motor: The patient has good strength in all 4 extremities.  Sensory examination: Soft touch sensation on the face, arms, and legs.  Coordination: The patient has good finger-nose-finger on the right, severe dysmetria on the left. The patient has dysmetria with heel-to-shin, normal on the  right.  Gait and station: The patient gait was not tested.  Reflexes: Deep tendon reflexes are symmetric.    ASSESSMENT Mr. Hugh Kamara is a 57 y.o. male presenting with a left cerebellar stroke. The patient was on Coumadin prior to admission, but INR was subtherapeutic. The patient has significant edema cerebellar hemisphere with effacement of the fourth ventricle. No hydrocephalus is seen. Multiple bihemispheric old strokes were seen. The patient has a prior history of an old right cerebellar stroke as well.   Left cerebellar stroke  Bihemispheric strokes, old  Aortic valve replacement  Mitral valve replacement  Long-term anticoagulation therapy  Hospital day # 3  TREATMENT/PLAN  Hold Coumadin for now, patient could potentially require surgery  Consider IV heparin  Transferred to 3100 unit  Neurosurgery consult  Follow neuro status very closely, alert physician for any alteration in clinical status.   07/13/2013 9:30 AM  I

## 2013-07-13 NOTE — Progress Notes (Signed)
ANTICOAGULATION CONSULT NOTE Pharmacy Consult for Heparin Indication: mechanical MVR and AVR  No Known Allergies  Patient Measurements: Height: 6\' 6"  (198.1 cm) Weight: 173 lb 15.1 oz (78.9 kg) IBW/kg (Calculated) : 91.4  Vital Signs: Temp: 98.9 F (37.2 C) (11/30 2322) Temp src: Oral (11/30 2322) BP: 148/95 mmHg (11/30 2211) Pulse Rate: 77 (11/30 2211)  Labs:  Recent Labs  07/10/13 2345 07/11/13 0550 07/12/13 0440 07/13/13 2121  HGB  --  14.0  --   --   HCT  --  41.1  --   --   PLT  --  248  --   --   LABPROT  --  13.4 18.2*  --   INR  --  1.04 1.55*  --   HEPARINUNFRC  --   --   --  1.68*  CREATININE  --  1.11 1.13  --   TROPONINI <0.30 <0.30  --   --     Estimated Creatinine Clearance: 78.6 ml/min (by C-G formula based on Cr of 1.13).  Assessment: 57 y.o. male with mechanical AVR/MVR, new CVA, for heparin.  Received Lovenox 80 mg SQ q12h 11/27-11/30 am.  Heparin level tonight supratherapeutic, drawn appropriately from opposite arm. Likely elevated due to enoxaparin accumulation from prior treatment.  Goal of Therapy:  Heparin level 0.3-0.5 units/ml Monitor platelets by anticoagulation protocol: Yes   Plan:  Will hold heparin until 0200, then restart heparin 1000 units/hr Check heparin level in 8 hours.  Geannie Risen, PharmD, BCPS  07/13/2013,11:37 PM

## 2013-07-14 ENCOUNTER — Inpatient Hospital Stay (HOSPITAL_COMMUNITY): Payer: Medicaid Other

## 2013-07-14 ENCOUNTER — Encounter (HOSPITAL_COMMUNITY): Payer: Self-pay | Admitting: Radiology

## 2013-07-14 DIAGNOSIS — I6340 Cerebral infarction due to embolism of unspecified cerebral artery: Secondary | ICD-10-CM

## 2013-07-14 LAB — HEPARIN LEVEL (UNFRACTIONATED): Heparin Unfractionated: 1.1 IU/mL — ABNORMAL HIGH (ref 0.30–0.70)

## 2013-07-14 LAB — CBC
HCT: 40.1 % (ref 39.0–52.0)
Hemoglobin: 13.9 g/dL (ref 13.0–17.0)
MCHC: 34.7 g/dL (ref 30.0–36.0)
MCV: 92.6 fL (ref 78.0–100.0)
RDW: 12.1 % (ref 11.5–15.5)
WBC: 6.9 10*3/uL (ref 4.0–10.5)

## 2013-07-14 MED ORDER — WARFARIN SODIUM 10 MG PO TABS
10.0000 mg | ORAL_TABLET | Freq: Once | ORAL | Status: AC
  Administered 2013-07-14: 10 mg via ORAL
  Filled 2013-07-14: qty 1

## 2013-07-14 MED ORDER — WARFARIN - PHARMACIST DOSING INPATIENT
Freq: Every day | Status: DC
  Administered 2013-07-15: 18:00:00

## 2013-07-14 NOTE — Progress Notes (Addendum)
[facility] Progress Note [place] TEAM 1 - Stepdown/ICU TEAM   Kinte Trim ZOX:[id number] DOB: 1955/08/31 DOA: 07/10/2013 PCP: No primary provider on file.  Admit HPI / Brief Narrative: 57 y.o. male w/ a past medical history significant for mechanical aortic + mitral valve replacement who presented with nausea and vomiting. He stated that he was doing well until he suddenly developed nausea/vomiting, having multiple episodes. He denied abdominal pain, dysuria, diarrhea, fevers, or constipation.  He denied any sick contacts.   In the ED a urine drug screen was positive for marijuana, and an alcohol level less than 11. His INR was subtherapeutic at 0.96, though he stated he had been compliant with his Coumadin taking 5 mg daily.   HPI/Subjective: Pt has no new complaints.  No current N/V.  Does report some intermittent balance issues which have not changed.  Denies HA, drownziness, visual change, cp, sob, or abdom pain.  Feels unsteady on his feet.   Assessment/Plan:  Large L acute cerebellar ischemic infarction with mass effect with effacement of fourth ventricle  No signs of hydrocephalus per imaging thus far, but hemorrhagic conversion noted on CT this AM - to remain on 3100 due to risk of acute decompensation requiring emergent neurosurgery - cont heparin with eventual transition back to coumadin OK'd per Neuro   Acute kidney injury Resolved w/ volume expansion  DH / volume depletion Clinically resolved w/ IVF - good po intake   S/P mechanical aortic and mitral valve replacments INR subtherapeutic at time of admit - now covering w/ IV heparin - see discussion above   Code Status: FULL Family Communication: no family present at time of exam Disposition Plan: Neruro ICU until neuro status stable (at risk for herniation or hydrocephalus)  Consultants: Neurology Neurosurgery  Procedures: B carotid dopplers - no signif stenosis  TTE - normal systolic fxn, EF 81-19%, no  regional WMA - no evidence of issues w/ mechanical valves  Antibiotics: none  DVT prophylaxis: IV heparin  Objective: Blood pressure 136/83, pulse 85, temperature 98 F (36.7 C), temperature source Oral, resp. rate 20, height 6\' 6"  (1.981 m), weight 79.3 kg (174 lb 13.2 oz), SpO2 99.00%.  Intake/Output Summary (Last 24 hours) at 07/14/13 1117 Last data filed at 07/14/13 1000  Gross per 24 hour  Intake 2528.72 ml  Output   2100 ml  Net 428.72 ml   Exam: General: No acute respiratory distress Lungs: Clear to auscultation bilaterally without wheezes or crackles Cardiovascular: Regular rate and rhythm without murmur gallop or rub normal S1 and S2 Abdomen: Nontender, nondistended, soft, bowel sounds positive, no rebound, no ascites, no appreciable mass Extremities: No significant cyanosis, clubbing, or edema bilateral lower extremities  Data Reviewed: Basic Metabolic Panel:  Recent Labs Lab 07/10/13 1300 07/11/13 0550 07/12/13 0440  NA 139 138 139  K 4.2 4.4 4.1  CL 106 104 106  CO2 19 23 23   GLUCOSE 201* 109* 103*  BUN 14 12 10   CREATININE 1.40* 1.11 1.13  CALCIUM 9.0 8.8 8.6   Liver Function Tests:  Recent Labs Lab 07/10/13 1300 07/11/13 0550  AST 37 23  ALT 19 14  ALKPHOS 66 74  BILITOT 0.5 0.4  PROT 6.3 6.5  ALBUMIN 3.5 3.5    Recent Labs Lab 07/10/13 1807  LIPASE 21   CBC:  Recent Labs Lab 07/10/13 1300 07/11/13 0550 07/14/13 1020  WBC 10.0 10.8* 6.9  NEUTROABS 6.7  --   --   HGB 15.4 14.0 13.9  HCT  44.3 41.1 40.1  MCV 93.3 94.7 92.6  PLT 245 248 266   Cardiac Enzymes:  Recent Labs Lab 07/10/13 1807 07/10/13 2345 07/11/13 0550  TROPONINI <0.30 <0.30 <0.30    Recent Results (from the past 240 hour(s))  MRSA PCR SCREENING     Status: None   Collection Time    07/13/13  3:00 AM      Result Value Range Status   MRSA by PCR NEGATIVE  NEGATIVE Final   Comment:            The GeneXpert MRSA Assay (FDA     approved for NASAL  specimens     only), is one component of a     comprehensive MRSA colonization     surveillance program. It is not     intended to diagnose MRSA     infection nor to guide or     monitor treatment for     MRSA infections.     Studies:  Recent x-ray studies have been reviewed in detail by the Attending Physician  Scheduled Meds:  Scheduled Meds: . pantoprazole  40 mg Oral Q1200  . sodium chloride  3 mL Intravenous Q12H    Time spent on care of this patient: 25 mins   [facility] T  [facility] Office  [phone] Pager - Text Page per Loretha Stapler as per below:  On-Call/Text Page:      [email or web address]      password [email or web address]  If 7PM-7AM, please contact night-coverage [email or web address] Password [email or web address] 07/14/2013, 11:17 AM   LOS: 4 days

## 2013-07-14 NOTE — Progress Notes (Signed)
Patient ID: Jeff [name], male   DOB: 10-13-1955, 57 y.o.   MRN: [id number] CT scan today shows some early hemorrhagic conversion. Mass effect remains about the same. Clinically patient has some dysarthria and dysmetria but level of consciousness remains good. Hydrocephalus is not present. Will continue to follow clinically. I'll defer adjustment of anticoagulation status to neurology.

## 2013-07-14 NOTE — Consult Note (Signed)
Physical Medicine and Rehabilitation Consult Reason for Consult: CVA Referring Physician: [facility]   HPI: Jeff [name] is a 57 y.o.right handed male with history of aortic mitral valve replacement on chronic Coumadin therapy. Hypertension as well as CVA July of 2012. Presented 07/10/2013 with nausea vomiting and ataxia. Patient was independent prior to admission he does not drive. Cranial CT scan showed a large left acute cerebellar ischemic infarct with mass effect with effacement of the fourth ventricle as well as mild downward compression of the cerebellar tonsils. No signs of hydrocephalus. INR on admission of 0.96. By report patient has been compliant with his Coumadin. Patient did not receive TPA. Carotid Dopplers with no ICA stenosis. MRI of the brain is pending. Patient remains on chronic Coumadin therapy as well as the addition of intravenous heparin until INR therapeutic. He is tolerating a regular consistency diet. Physical therapy evaluation completed 07/13/2013 as well as occupational therapy with recommendations for physical medicine rehabilitation consult.   Review of Systems  Cardiovascular: Positive for palpitations.  Gastrointestinal: Positive for constipation.  Musculoskeletal: Positive for myalgias.  All other systems reviewed and are negative.   Past Medical History  Diagnosis Date  . History of CVA (cerebrovascular accident)     02/2011  . Hx of aortic valve replacement, mechanical   . H/O mitral valve replacement with mechanical valve   . HX: long term anticoagulant use    Past Surgical History  Procedure Laterality Date  . Cardiac valve replacement     Family History  Problem Relation Age of Onset  . Diabetes Mother     Died before her 6 birthday   Social History:  reports that he has been smoking Cigarettes.  He has been smoking about 0.05 packs per day. He does not have any smokeless tobacco history on file. He reports that he drinks about 8.4 ounces of alcohol  per week. He reports that he uses illicit drugs (Marijuana) about 7 times per week. Allergies: No Known Allergies Medications Prior to Admission  Medication Sig Dispense Refill  . warfarin (COUMADIN) 5 MG tablet Take 5 mg by mouth daily.        Home: Home Living Family/patient expects to be discharged to:: Private residence Living Arrangements: Alone Available Help at Discharge: Neighbor;Available 24 hours/day Type of Home: House Home Access: Level entry Home Layout: One level Home Equipment: None  Functional History: Prior Function Comments: Pt does not drive.  Pt ambulates frequently long distances. does not work, sister takes pt to grocery store Functional Status:  Mobility: Bed Mobility Bed Mobility: Supine to Sit Supine to Sit: 6: Modified independent (Device/Increase time);With rails;HOB flat Transfers Transfers: Sit to Stand;Stand to Sit;Stand Pivot Transfers Sit to Stand: 4: Min assist;With upper extremity assist Stand to Sit: 4: Min assist;With upper extremity assist;To chair/3-in-1 Ambulation/Gait Ambulation/Gait Assistance: 3: Mod assist Ambulation Distance (Feet): 80 Feet Assistive device: Rolling walker Ambulation/Gait Assistance Details: cueing for position inRW and gaze stabilization with assist to maintain contact with RW on the ground and for balance Gait Pattern: Ataxic;Wide base of support Gait velocity: decreased Stairs: No    ADL: ADL Eating/Feeding: Modified independent Where Assessed - Eating/Feeding: Chair Grooming: Wash/dry hands;Wash/dry face;Teeth care;Set up Where Assessed - Grooming: Unsupported sitting Upper Body Bathing: Minimal assistance Where Assessed - Upper Body Bathing: Unsupported sitting Lower Body Bathing: Moderate assistance Where Assessed - Lower Body Bathing: Supported sit to stand Upper Body Dressing: Set up Where Assessed - Upper Body Dressing: Unsupported sitting Lower Body Dressing: Moderate assistance  Where Assessed -  Lower Body Dressing: Supported sit to stand Toilet Transfer: Minimal assistance Toilet Transfer Method: Sit to Barista: Bedside commode Equipment Used: Gait belt Transfers/Ambulation Related to ADLs: min assist with momentary stand bed to chair, some truncal ataxia. ADL Comments: mod assist for standing balance.  Cognition: Cognition Overall Cognitive Status: No family/caregiver present to determine baseline cognitive functioning Orientation Level: Oriented X4 Cognition Arousal/Alertness: Awake/alert Behavior During Therapy: WFL for tasks assessed/performed Overall Cognitive Status: No family/caregiver present to determine baseline cognitive functioning Area of Impairment: Attention;Safety/judgement;Awareness Current Attention Level: Alternating Safety/Judgement: Decreased awareness of deficits General Comments: Pt required mod verbal cues to maintain attention during visual assessment.  Pt. with obvious coordination deficits, but has not informed MD or nurses - ? awareness  Blood pressure 136/83, pulse 85, temperature 98 F (36.7 C), temperature source Oral, resp. rate 20, height 6\' 6"  (1.981 m), weight 79.3 kg (174 lb 13.2 oz), SpO2 99.00%. Physical Exam  Vitals reviewed. Constitutional: He is oriented to person, place, and time. He appears well-developed.  HENT:  Head: Normocephalic.  Poor dentition  Eyes: EOM are normal.  Neck: Normal range of motion. Neck supple. No thyromegaly present.  Cardiovascular:  Cardiac rate controlled  Respiratory: Effort normal and breath sounds normal. No respiratory distress.  GI: Soft. Bowel sounds are normal. He exhibits no distension.  Neurological: He is alert and oriented to person, place, and time.  Nystagmus with lateral gaze. Head literally spinning clockwise while seated at EOB. Leans to right when standing and sitting. Speech clear.   Skin: Skin is warm and dry.  Psychiatric: He has a normal mood and affect.     Results for orders placed during the hospital encounter of 07/10/13 (from the past 24 hour(s))  HEPARIN LEVEL (UNFRACTIONATED)     Status: Abnormal   Collection Time    07/13/13  9:21 PM      Result Value Range   Heparin Unfractionated 1.68 (*) 0.30 - 0.70 IU/mL  CBC     Status: None   Collection Time    07/14/13 10:20 AM      Result Value Range   WBC 6.9  4.0 - 10.5 K/uL   RBC 4.33  4.22 - 5.81 MIL/uL   Hemoglobin 13.9  13.0 - 17.0 g/dL   HCT 52.8  41.3 - 24.4 %   MCV 92.6  78.0 - 100.0 fL   MCH 32.1  26.0 - 34.0 pg   MCHC 34.7  30.0 - 36.0 g/dL   RDW 01.0  27.2 - 53.6 %   Platelets 266  150 - 400 K/uL  HEPARIN LEVEL (UNFRACTIONATED)     Status: Abnormal   Collection Time    07/14/13 10:20 AM      Result Value Range   Heparin Unfractionated 1.10 (*) 0.30 - 0.70 IU/mL   Ct Angio Head W/cm &/or Wo Cm  07/12/2013   CLINICAL DATA:  Ataxia, suspect cerebral vascular accident  EXAM: CT ANGIOGRAPHY HEAD  TECHNIQUE: Multidetector CT imaging of the head was performed using the standard protocol during bolus administration of intravenous contrast. Multiplanar CT image reconstructions including MIPs were obtained to evaluate the vascular anatomy.  CONTRAST:  OMNIPAQUE IOHEXOL 350 MG/ML SOLN  COMPARISON:  Prior study from 02/16/2011.  FINDINGS: Precontrast images of the brain demonstrating multiple cortical and subcortical wedge-shaped hypodensities involving both frontal lobes as well as the right frontoparietal region, not significantly changed relative to prior examination. Additional wedge-shaped hypodensity and encephalomalacia within  the right cerebral hemisphere is compatible with remote infarct as well.  There is new vague hypodensity measuring 4.5 x 4.5 cm involving the left cerebellar hemisphere (series 5, image 24). Finding is compatible with acute ischemic infarct. There is localized mass effect with effacement of the 4th ventricle. The cerebellar vermis is involved on the  left. Ventricles are stable in size without evidence of hydrocephalus. There is crowding of the basilar cisterns with the cerebellar tonsils low lying in the foramen magnum. No acute intracranial hemorrhage identified. No extra-axial fluid collection. No mass lesion identified.  Calvarium is intact. Lipoma within the left frontal scalp is unchanged. Orbital soft tissues are within normal limits. Paranasal sinuses and mastoid air cells are clear.  Post-contrast CTA images of the brain are somewhat limited due to timing of the contrast bolus.  The distal vertebral arteries are not well visualized. The vertebrobasilar junction and basilar artery are widely patent. No basilar tip aneurysm The posterior cerebral arteries and superior cerebellar arteries are grossly patent. Anterior inferior cerebellar arteries are not well visualized, and are likely hypoplastic. The posterior cerebral arteries are grossly patent.  The visualized petrous, cavernous, and supra clinoid segments of the internal carotid arteries are widely patent bilaterally without high-grade stenosis. The A1 segments are symmetric and patent bilaterally. Anterior communicating artery is grossly normal. The A2 segments are widely patent. The middle cerebral arteries are well opacified without high-grade stenosis or other abnormality. No intracranial aneurysm seen within the anterior circulation. Posterior communicating arteries are not well evaluated.  Review of the MIP images confirms the above findings.  IMPRESSION: 1. Ischemic left cerebellar infarct with involvement of the cerebellar vermis. There is secondary effacement of the 4th ventricle with basilar system crowding and inferior displacement of the cerebellar tonsils into the foramen magnum. No hydrocephalus at this time. No evidence of intracranial hemorrhage. 2. Similar appearance of remote ischemic infarcts involving the bilateral frontal lobes, right frontoparietal region, and right cerebellar  hemisphere. 3. Somewhat limited CTA without evidence of high-grade stenosis, occlusion, or intracranial aneurysm. Critical Value/emergent results were called by telephone at the time of interpretation on 07/12/2013 at 10:49 PM to Dr.Tom Claiborne Billings , who verbally acknowledged these results.   Electronically Signed   By: Rise Mu M.D.   On: 07/12/2013 22:54   Ct Head Wo Contrast  07/14/2013   CLINICAL DATA:  Follow-up. History of infarcts. No change. Cardiac valve replacement.  EXAM: CT HEAD WITHOUT CONTRAST  TECHNIQUE: Contiguous axial images were obtained from the base of the skull through the vertex without intravenous contrast.  COMPARISON:  07/12/2013.  FINDINGS: Large left cerebellar/vermis infarct with hemorrhagic conversion and increase in mass effect causing distortion of the midbrain and pons. Compression of the 4th ventricle. Patient is at risk for development of hydrocephalus. Currently, no hydrocephalus. Close followup recommended. There may be a small amount of subdural blood along the left tentorium and adjacent to the left petrous temporal bone. Cerebellar tonsils low low lying. The patient is not safe for lumbar puncture.  Remote infarction frontal lobes, posterior right frontal -parietal lobe junction and right cerebellum.  No intracranial mass lesion noted on this unenhanced exam.  Subcutaneous lipoma left posterior frontal region.  IMPRESSION: Large left cerebellar/vermis infarct with hemorrhagic conversion and increase in mass effect causing distortion of the midbrain and pons. Compression of the 4th ventricle. Patient is at risk for development of hydrocephalus. Currently, no hydrocephalus. Close followup recommended. There may be a small amount of subdural blood along the  left tentorium and adjacent to the left petrous temporal bone. Cerebellar tonsils low low lying. The patient is not safe for lumbar puncture.  Remote infarction frontal lobes, posterior right frontal -parietal lobe  junction and right cerebellum.  These results will be called to the ordering clinician or representative by the Radiologist Assistant, and communication documented in the PACS Dashboard.   Electronically Signed   By: Bridgett Larsson M.D.   On: 07/14/2013 07:50    Assessment/Plan: Diagnosis: left cerebellar infarct 1. Does the need for close, 24 hr/day medical supervision in concert with the patient's rehab needs make it unreasonable for this patient to be served in a less intensive setting? Yes 2. Co-Morbidities requiring supervision/potential complications: hs AKI 3. Due to bladder management, bowel management, safety, skin/wound care, disease management, medication administration and patient education, does the patient require 24 hr/day rehab nursing? Yes 4. Does the patient require coordinated care of a physician, rehab nurse, PT (1-2 hrs/day, 5 days/week) and OT (1-2 hrs/day, 5 days/week) to address physical and functional deficits in the context of the above medical diagnosis(es)? Yes Addressing deficits in the following areas: balance, endurance, locomotion, strength, transferring, bowel/bladder control, bathing, dressing, feeding, grooming and toileting 5. Can the patient actively participate in an intensive therapy program of at least 3 hrs of therapy per day at least 5 days per week? Yes 6. The potential for patient to make measurable gains while on inpatient rehab is excellent 7. Anticipated functional outcomes upon discharge from inpatient rehab are mod I with PT, mod I with OT, n/a with SLP. 8. Estimated rehab length of stay to reach the above functional goals is: 7-10 days 9. Does the patient have adequate social supports to accommodate these discharge functional goals? Yes 10. Anticipated D/C setting: Home 11. Anticipated post D/C treatments: HH therapy 12. Overall Rehab/Functional Prognosis: excellent  RECOMMENDATIONS: This patient's condition is appropriate for continued  rehabilitative care in the following setting: CIR Patient has agreed to participate in recommended program. Yes Note that insurance prior authorization may be required for reimbursement for recommended care.  Comment: Rehab RN to follow up.   Ranelle Oyster, MD, [place] [facility]     07/14/2013

## 2013-07-14 NOTE — Progress Notes (Signed)
ANTICOAGULATION CONSULT NOTE Pharmacy Consult for Heparin / Coumadin  Indication: mechanical MVR and AVR  No Known Allergies  Patient Measurements: Height: 6\' 6"  (198.1 cm) Weight: 174 lb 13.2 oz (79.3 kg) IBW/kg (Calculated) : 91.4  Vital Signs: Temp: 98 F (36.7 C) (12/01 0700) Temp src: Oral (12/01 0700) BP: 136/83 mmHg (12/01 0900) Pulse Rate: 85 (12/01 1000)  Labs:  Recent Labs  07/12/13 0440 07/13/13 2121 07/14/13 1020  HGB  --   --  13.9  HCT  --   --  40.1  PLT  --   --  266  LABPROT 18.2*  --   --   INR 1.55*  --   --   HEPARINUNFRC  --  1.68* 1.10*  CREATININE 1.13  --   --     Estimated Creatinine Clearance: 78.9 ml/min (by C-G formula based on Cr of 1.13).  Assessment: 57 y.o. male with mechanical AVR/MVR, new CVA, for heparin.  Received Lovenox 80 mg SQ q12h 11/27-11/30 am.  Heparin level tonight supratherapeutic, drawn appropriately from opposite arm. Likely elevated due to enoxaparin accumulation from prior treatment.  Resuming warfarin today  Goal of Therapy:  Heparin level 0.3-0.5 units/ml Monitor platelets by anticoagulation protocol: Yes INR = 2.5 to 3.5   Plan:  Will hold heparin until 1230 pm, then restart heparin at 800 units/hr Check heparin level in 8 hours. Coumadin 10 mg po x 1 dose tonight  Thank you. Okey Regal, PharmD [phone]  07/14/2013,11:20 AM

## 2013-07-14 NOTE — Progress Notes (Signed)
Dr. Danielle Dess notified that CT showed hemorrhagic conversion and possible SDH. Dr. Danielle Dess advised that it might be best to turn Heparin drip off. Also advised to consult with Stroke team in regards to what they would advise. I spoke with Dr. Pearlean Brownie and he said he would make a decision in regards to the heparin. Pt neurological status remains unchanged.

## 2013-07-14 NOTE — Progress Notes (Signed)
I met with pt and his daughter, Patsy Lager, at bedside. Discussed inpt rehab admission. I will await medical readiness to admit pending bed availability. [phone]

## 2013-07-14 NOTE — Progress Notes (Signed)
Occupational Therapy Treatment Patient Details Name: Jeff [name] MRN: [id number] DOB: 05-10-1956 Today's Date: 07/14/2013 Time: 6440-3474 OT Time Calculation (min): 34 min  OT Assessment / Plan / Recommendation  History of present illness 57 y.o. male past medical history significant for history of aortic /mitral valve replacement -mechanical valve who presents with nausea and vomitting. He went and donated blood, walked back home and sometime after he got home he suddenly developed nausea vomiting-multiple episodes nonbloody. He denies abdominal pain, dysuria diarrhea fevers and no constipation. He states that he had not eaten before going to give the blood and all he had all day with some candy. He denies any sick contacts. He admits to alcohol use -since that he drinks about a quart of beer once a week. he was seen in the ED and urine drug screen was positive for marijuana, or analysis was unremarkable for infection and alcohol level less than 11. His INR was subtherapeutic at 0.96. CT showed left cerebellar CVA   OT comments  Highly motivated for OOB and ADL training.  Improvement noted in attention, but does not seem to fully appreciate deficits.    Follow Up Recommendations  CIR    Barriers to Discharge       Equipment Recommendations   (TBD)    Recommendations for Other Services    Frequency Min 3X/week   Progress towards OT Goals Progress towards OT goals: Progressing toward goals  Plan Discharge plan remains appropriate    Precautions / Restrictions Precautions Precautions: Fall   Pertinent Vitals/Pain No pain, VSS.    ADL  Grooming: Wash/dry hands;Wash/dry face;Teeth care;Set up Where Assessed - Grooming: Unsupported sitting Upper Body Bathing: Minimal assistance Where Assessed - Upper Body Bathing: Unsupported sitting Upper Body Dressing: Set up Where Assessed - Upper Body Dressing: Unsupported sitting Toilet Transfer: Minimal assistance Toilet Transfer Method: Sit  to stand Toilet Transfer Equipment: Bedside commode Toileting - Clothing Manipulation and Hygiene: Moderate assistance Where Assessed - Toileting Clothing Manipulation and Hygiene: Standing Equipment Used: Gait belt Transfers/Ambulation Related to ADLs: min assist with momentary stand bed to chair, some truncal ataxia. ADL Comments: mod assist for standing balance.    OT Diagnosis:    OT Problem List:   OT Treatment Interventions:     OT Goals(current goals can now be found in the care plan section)    Visit Information  Last OT Received On: 07/14/13 Assistance Needed: +1 History of Present Illness: 57 y.o. male past medical history significant for history of aortic /mitral valve replacement -mechanical valve who presents with nausea and vomitting. He went and donated blood, walked back home and sometime after he got home he suddenly developed nausea vomiting-multiple episodes nonbloody. He denies abdominal pain, dysuria diarrhea fevers and no constipation. He states that he had not eaten before going to give the blood and all he had all day with some candy. He denies any sick contacts. He admits to alcohol use -since that he drinks about a quart of beer once a week. he was seen in the ED and urine drug screen was positive for marijuana, or analysis was unremarkable for infection and alcohol level less than 11. His INR was subtherapeutic at 0.96. CT showed left cerebellar CVA    Subjective Data      Prior Functioning       Cognition  Cognition Arousal/Alertness: Awake/alert Behavior During Therapy: WFL for tasks assessed/performed Area of Impairment: Attention;Safety/judgement;Awareness Current Attention Level: Alternating Safety/Judgement: Decreased awareness of deficits    Mobility  Bed Mobility Bed Mobility: Supine to Sit Supine to Sit: 6: Modified independent (Device/Increase time);With rails;HOB flat Transfers Transfers: Sit to Stand;Stand to Sit Sit to Stand: 4: Min  assist;With upper extremity assist Stand to Sit: 4: Min assist;With upper extremity assist;To chair/3-in-1 Details for Transfer Assistance: assist for balance, distracted by arrival of neighbor    Exercises      Balance     End of Session OT - End of Session Activity Tolerance: Patient tolerated treatment well Patient left: in chair;with call bell/phone within reach;with family/visitor present Nurse Communication: Mobility status  GO     Evern Bio 07/14/2013, 10:52 AM [phone]

## 2013-07-14 NOTE — Progress Notes (Signed)
Physical Therapy Treatment Patient Details Name: Jeff [name] MRN: [id number] DOB: February 18, 1956 Today's Date: 07/14/2013 Time: 6213-0865 PT Time Calculation (min): 23 min  PT Assessment / Plan / Recommendation  History of Present Illness 57 y.o. male past medical history significant for history of aortic /mitral valve replacement -mechanical valve who presents with nausea and vomitting. He went and donated blood, walked back home and sometime after he got home he suddenly developed nausea vomiting-multiple episodes nonbloody. He denies abdominal pain, dysuria diarrhea fevers and no constipation. He states that he had not eaten before going to give the blood and all he had all day with some candy. He denies any sick contacts. He admits to alcohol use -since that he drinks about a quart of beer once a week. he was seen in the ED and urine drug screen was positive for marijuana, or analysis was unremarkable for infection and alcohol level less than 11. His INR was subtherapeutic at 0.96. CT showed left cerebellar CVA   PT Comments   Pt progressing well however remains at increased falls risk due to ataxia. Pt with improved co-ordination with tactile cues at hips. Pt remains appropriate for CIR upon d/c from hospital to achieve maximal functional recovery for safe d/c home.  Follow Up Recommendations  CIR     Does the patient have the potential to tolerate intense rehabilitation     Barriers to Discharge        Equipment Recommendations  Rolling walker with 5" wheels    Recommendations for Other Services Rehab consult  Frequency Min 4X/week   Progress towards PT Goals Progress towards PT goals: Progressing toward goals  Plan Current plan remains appropriate    Precautions / Restrictions Precautions Precautions: Fall Restrictions Weight Bearing Restrictions: No   Pertinent Vitals/Pain Denies pain    Mobility  Bed Mobility Bed Mobility: Supine to Sit Supine to Sit: 5:  Supervision Details for Bed Mobility Assistance: used bed rail Transfers Transfers: Sit to Stand;Stand to Sit;Stand Pivot Transfers Sit to Stand: 4: Min assist;With upper extremity assist Stand to Sit: 4: Min assist;With upper extremity assist;To chair/3-in-1 Details for Transfer Assistance: assist for balance Ambulation/Gait Ambulation/Gait Assistance: 2: Max assist Ambulation Distance (Feet): 120 Feet Assistive device: Rolling walker Ambulation/Gait Assistance Details: max tactile cues at hips to provide proprioception/tactile cues to stimulate appropriate muscle groups for appropriate gait pattern. Pt otherwise extremely ataxic and unable to sequenc/co-ordinate appropriate gait pattern. Assist to mange RW as well Gait Pattern: Ataxic;Wide base of support Stairs: No Modified Rankin (Stroke Patients Only) Pre-Morbid Rankin Score: No symptoms Modified Rankin: Moderately severe disability    Exercises     PT Diagnosis:    PT Problem List:   PT Treatment Interventions:     PT Goals (current goals can now be found in the care plan section)    Visit Information  Last PT Received On: 07/14/13 Assistance Needed: +1 History of Present Illness: 57 y.o. male past medical history significant for history of aortic /mitral valve replacement -mechanical valve who presents with nausea and vomitting. He went and donated blood, walked back home and sometime after he got home he suddenly developed nausea vomiting-multiple episodes nonbloody. He denies abdominal pain, dysuria diarrhea fevers and no constipation. He states that he had not eaten before going to give the blood and all he had all day with some candy. He denies any sick contacts. He admits to alcohol use -since that he drinks about a quart of beer once a week. he was  seen in the ED and urine drug screen was positive for marijuana, or analysis was unremarkable for infection and alcohol level less than 11. His INR was subtherapeutic at 0.96. CT  showed left cerebellar CVA    Subjective Data  Subjective: Pt agreeable to amb with encourgament   Cognition  Cognition Arousal/Alertness: Awake/alert Behavior During Therapy: WFL for tasks assessed/performed Area of Impairment: Awareness;Problem solving Safety/Judgement: Decreased awareness of safety;Decreased awareness of deficits Awareness: Emergent Problem Solving: Slow processing;Requires tactile cues    Balance  Balance Balance Assessed: Yes Static Sitting Balance Static Sitting - Balance Support: Bilateral upper extremity supported Static Sitting - Level of Assistance: 5: Stand by assistance Static Sitting - Comment/# of Minutes: 5. without UE support pt with lateral sway L/R and report of dizziness Static Standing Balance Static Standing - Balance Support: Bilateral upper extremity supported Static Standing - Level of Assistance: 3: Mod assist Static Standing - Comment/# of Minutes: 2 min - attempted marching in place  End of Session PT - End of Session Equipment Utilized During Treatment: Gait belt Activity Tolerance: Patient tolerated treatment well Patient left: in bed;with call bell/phone within reach Nurse Communication: Mobility status   GP     Marcene Brawn 07/14/2013, 4:47 PM  Lewis Shock, PT, DPT Pager #: [phone] Office #: [phone]

## 2013-07-14 NOTE — Progress Notes (Addendum)
ANTICOAGULATION CONSULT NOTE Pharmacy Consult for Heparin   Indication: mechanical MVR and AVR  No Known Allergies  Patient Measurements: Height: 6\' 6"  (198.1 cm) Weight: 174 lb 13.2 oz (79.3 kg) IBW/kg (Calculated) : 91.4  Vital Signs: Temp: 98.7 F (37.1 C) (12/01 1553) Temp src: Oral (12/01 1553) BP: 155/111 mmHg (12/01 1900) Pulse Rate: 86 (12/01 1900)  Labs:  Recent Labs  07/12/13 0440 07/13/13 2121 07/14/13 1020 07/14/13 2110  HGB  --   --  13.9  --   HCT  --   --  40.1  --   PLT  --   --  266  --   LABPROT 18.2*  --   --   --   INR 1.55*  --   --   --   HEPARINUNFRC  --  1.68* 1.10* 0.43  CREATININE 1.13  --   --   --     Estimated Creatinine Clearance: 78.9 ml/min (by C-G formula based on Cr of 1.13).  Assessment: 57 y.o. male with mechanical AVR/MVR, new CVA, for heparin.  Received Lovenox 80 mg SQ q12h 11/27-11/30 am.  Initial heparin level likely elevated due to enoxaparin accumulation from prior treatment.  Heparin level now in goal range on 800 units/hr.  Noted CT scan showing early hemorrhagic conversion, per neurology note, continuing anticoagulation for now.  Goal of Therapy:  Heparin level 0.3-0.5 units/ml Monitor platelets by anticoagulation protocol: Yes INR = 2.5 to 3.5   Plan:  1. Continue IV heparin at current rate. 2. F/U AM heparin level and CBC.  Tad Moore, BCPS  Clinical Pharmacist Pager [phone]  07/14/2013 9:51 PM

## 2013-07-14 NOTE — Progress Notes (Addendum)
Stroke Team Progress Note  HISTORY Tristan Proto is an 57 y.o. male with a history of mitral and aortic heart valve replacements and chronic anticoagulation, and previous cerebral infarctions, admitted on 07/10/2013 with acute onset of nausea vomiting and ataxia. He reports he was last known well at 1500, though he arrived to [facility] at 1248. Neuro consult was obtained 11/30 and pt was transferred to [facility]  NICU.  There is no previous history of stroke nor TIA. His INR was subtherapeutic on admission. He has since been given Lovenox and Coumadin increased. He has not experienced diplopia nor changes in speech. He has not attempted to walk since admission. Nausea and vomiting have subsided. CT scan of his head showed large left acute cerebellar ischemic infarction with mass effect with effacement of fourth ventricle as well as mild downward compression of the cerebellar tonsils. No signs of hydrocephalus were noted. Old right and left frontal infarctions are also noted. NIH stroke score was 2. Patient was not a TPA candidate secondary to duration of symptoms.   SUBJECTIVE No family at bedside. Pt sitting up in bed, awake, talking.  OBJECTIVE Most recent Vital Signs: Filed Vitals:   07/14/13 0800 07/14/13 0820 07/14/13 0850 07/14/13 0900  BP: 170/113 169/107 156/103 136/83  Pulse: 84 84 85 88  Temp:      TempSrc:      Resp: 17 17 14 17   Height:      Weight:      SpO2: 100% 100% 95% 99%   CBG (last 3)  No results found for this basename: GLUCAP,  in the last 72 hours  IV Fluid Intake:   . sodium chloride 75 mL/hr at 07/14/13 0900  . heparin Stopped (07/14/13 0800)    MEDICATIONS  . pantoprazole  40 mg Oral Q1200  . sodium chloride  3 mL Intravenous Q12H   PRN:  acetaminophen, acetaminophen, metoCLOPramide (REGLAN) injection, ondansetron (ZOFRAN) IV  Diet:  General thin liquids Activity:  DVT Prophylaxis:  IV heparin  CLINICALLY SIGNIFICANT STUDIES Basic Metabolic Panel:   Recent  Labs Lab 07/11/13 0550 07/12/13 0440  NA 138 139  K 4.4 4.1  CL 104 106  CO2 23 23  GLUCOSE 109* 103*  BUN 12 10  CREATININE 1.11 1.13  CALCIUM 8.8 8.6   Liver Function Tests:   Recent Labs Lab 07/10/13 1300 07/11/13 0550  AST 37 23  ALT 19 14  ALKPHOS 66 74  BILITOT 0.5 0.4  PROT 6.3 6.5  ALBUMIN 3.5 3.5   CBC:   Recent Labs Lab 07/10/13 1300 07/11/13 0550  WBC 10.0 10.8*  NEUTROABS 6.7  --   HGB 15.4 14.0  HCT 44.3 41.1  MCV 93.3 94.7  PLT 245 248   Coagulation:   Recent Labs Lab 07/10/13 1300 07/11/13 0550 07/12/13 0440  LABPROT 12.6 13.4 18.2*  INR 0.96 1.04 1.55*   Cardiac Enzymes:   Recent Labs Lab 07/10/13 1807 07/10/13 2345 07/11/13 0550  TROPONINI <0.30 <0.30 <0.30   Urinalysis:   Recent Labs Lab 07/10/13 1401  COLORURINE AMBER*  LABSPEC 1.022  PHURINE 5.5  GLUCOSEU 100*  HGBUR SMALL*  BILIRUBINUR NEGATIVE  KETONESUR NEGATIVE  PROTEINUR 30*  UROBILINOGEN 1.0  NITRITE NEGATIVE  LEUKOCYTESUR NEGATIVE   Lipid Panel    Component Value Date/Time   CHOL 139 02/16/2011 0550   TRIG 57 02/16/2011 0550   HDL 46 02/16/2011 0550   CHOLHDL 3.0 02/16/2011 0550   VLDL 11 02/16/2011 0550   LDLCALC 82 02/16/2011  0550   HgbA1C  Lab Results  Component Value Date   HGBA1C 5.2 07/12/2013    Urine Drug Screen:     Component Value Date/Time   LABOPIA NONE DETECTED 07/10/2013 1401   COCAINSCRNUR NONE DETECTED 07/10/2013 1401   LABBENZ NONE DETECTED 07/10/2013 1401   AMPHETMU NONE DETECTED 07/10/2013 1401   THCU POSITIVE* 07/10/2013 1401   LABBARB NONE DETECTED 07/10/2013 1401    Alcohol Level:   Recent Labs Lab 07/10/13 1300  ETH <11    CT Head  07/14/2013  Large left cerebellar/vermis infarct with hemorrhagic conversion and increase in mass effect causing distortion of the midbrain and pons. Compression of the 4th ventricle. Patient is at risk for development of hydrocephalus. Currently, no hydrocephalus. Close followup recommended.  There may be a small amount of subdural blood along the left tentorium and adjacent to the left petrous temporal bone. Cerebellar tonsils low low lying. The patient is not safe for lumbar puncture.  Remote infarction frontal lobes, posterior right frontal -parietal lobe junction and right cerebellum.    CT Angio Head 07/12/2013    1. Ischemic left cerebellar infarct with involvement of the cerebellar vermis. There is secondary effacement of the 4th ventricle with basilar system crowding and inferior displacement of the cerebellar tonsils into the foramen magnum. No hydrocephalus at this time. No evidence of intracranial hemorrhage. 2. Similar appearance of remote ischemic infarcts involving the bilateral frontal lobes, right frontoparietal region, and right cerebellar hemisphere. 3. Somewhat limited CTA without evidence of high-grade stenosis, occlusion, or intracranial aneurysm.   MRI of the brain    MRA of the brain  See CT angio head  2D Echocardiogram  The estimated ejection fraction was in the range of 55% to 60%. Aortic valve: A mechanical prosthesis was present.  Mitral valve: A mechanical prosthesis was present. Oscillating density in LV cavity most likely residual MV apparatus (unchanged comared to previous study). Ascending aorta appears to be severely dilated; suggest CTA to further assess.   Carotid Doppler   1-39% ICA stenosis. Vertebral artery flow is antegrade.   CXR    EKG  .   Therapy Recommendations CIR  Physical Exam General: The patient is alert and cooperative at the time of the examination. Skin: No significant peripheral edema is noted.  Neurologic Exam Mental status: The patient is oriented x 3. Cranial nerves: Facial symmetry is present. Speech is slightly dysarthric. Extraocular movements are full but with saccadic dysmetria on left gaze. Visual fields are full. Motor: The patient has good strength in all 4 extremities. Sensory examination: Soft touch sensation on  the face, arms, and legs. Coordination: The patient has good finger-nose-finger on the right, severe dysmetria on the left. The patient has dysmetria with heel-to-shin, normal on the right. Gait and station: The patient gait was not tested. Reflexes: Deep tendon reflexes are symmetric.  ASSESSMENT Mr. Ina Poupard is a 57 y.o. male presenting with sudden onset nausea, vomiting, ataxia after giving plasma. Stroke not recognized on arrival. Imaging confirms a left cerebellar infarct. The patient has significant cerebellar edema with effacement of the fourth ventricle. Today with mild hemorrhagic transformation (asymptomatic). Multiple old  bihemispheric strokes were seen. 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 Current infarct felt to be embolic secondary to mechanical heart valve with subtherapeutic INR.  Work up underway. On warfarin prior to admission; INR subtherapeutic on admission. Now on heparin for secondary stroke prevention. Patient with resultant ataxia, dizziness. He is  awake and talkative.   Aortic valve replacement  Mitral valve replacement  Long-term anticoagulation therapy due to mechanical heart vavle  Cigarette smoker  etoh use  Marijuana use  Hospital day # 4  TREATMENT/PLAN  Continue ICU monitoring over night  Resume Coumadin, overlap with IV heparin.  Should pt have neurologic worsening, will adjust anticoagulation  OOB  Therapy evals  Check MRI  F/u lipids  Long d/w patient about stroke risk from atrial fibrillation, risk of hemorrhage on heparin/warfarin, lack of efficacy of warfarin in his case and absence of any other suitable alternative medicines for his mechanical heart valve need for longterm anticoagulation. There is significant risk of worsening of his hemorrhagic transformation with IV heparin and warfarin but he clinically seems to be doing quite well and he is already at day 5 since his stroke and may be at the end of  his maximum cerebral edema he had hence I feel ongoing anticoagulation may be justified. He will need careful neurological monitoring in the ICU and hopefully we can avoid neurosurgical intervention  Annie Main, MSN, RN, ANVP-BC, ANP-BC, GNP-BC [facility] Pager: [phone] 07/14/2013 9:15 AM This patient is critically ill and at significant risk of neurological worsening, death and care requires constant monitoring of vital signs, hemodynamics,respiratory and cardiac monitoring,review of multiple databases, neurological assessment, discussion with family, other specialists and medical decision making of high complexity. I spent 35 minutes of neurocritical care time  in the care of  this patient. I have personally obtained a history, examined the patient, evaluated imaging results, and formulated the assessment and plan of care. I agree with the above.  Delia Heady, MD

## 2013-07-15 DIAGNOSIS — G935 Compression of brain: Secondary | ICD-10-CM

## 2013-07-15 LAB — CBC
HCT: 37.1 % — ABNORMAL LOW (ref 39.0–52.0)
Hemoglobin: 13.1 g/dL (ref 13.0–17.0)
MCV: 92.8 fL (ref 78.0–100.0)
RBC: 4 MIL/uL — ABNORMAL LOW (ref 4.22–5.81)
RDW: 11.9 % (ref 11.5–15.5)
WBC: 6.8 10*3/uL (ref 4.0–10.5)

## 2013-07-15 LAB — PROTIME-INR
INR: 1.41 (ref 0.00–1.49)
Prothrombin Time: 16.9 seconds — ABNORMAL HIGH (ref 11.6–15.2)

## 2013-07-15 MED ORDER — WARFARIN SODIUM 10 MG PO TABS
10.0000 mg | ORAL_TABLET | Freq: Once | ORAL | Status: AC
  Administered 2013-07-15: 10 mg via ORAL
  Filled 2013-07-15: qty 1

## 2013-07-15 NOTE — PMR Pre-admission (Signed)
PMR Admission Coordinator Pre-Admission Assessment  Patient: Jeff [name] is an 57 y.o., male MRN: [id number] DOB: 1956/06/10 Height: 6\' 6"  (198.1 cm) Weight: 79.3 kg (174 lb 13.2 oz)              Insurance Information HMO:     PPO:      PCP:      IPA:      80/20:      OTHER:  PRIMARY: self pay        Medicaid Application Date: I contacted financial counselor 12/1 to request disability and medicaid applications that his sister can assist with. 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 Hypertension as well as CVA July of 2012.   Presented 07/10/2013 with nausea vomiting and ataxia. Patient was independent prior to admission he does not drive. Cranial CT scan showed a large left acute cerebellar ischemic infarct with mass effect with effacement of the fourth ventricle as well as mild downward compression of the cerebellar tonsils. No signs of hydrocephalus. INR on admission of 0.96. By report patient has been compliant with his Coumadin. Patient did not receive TPA. Carotid Dopplers with no ICA stenosis.  Patient remains on chronic Coumadin therapy as well as the addition of intravenous heparin until INR therapeutic. He is tolerating a regular consistency diet.  Coumadin with IV heaprin bridge. Start HCTZ 12.5 mg daily today for HTN. Total: 2 NIH  Past Medical History  Past Medical History  Diagnosis Date  . History of CVA (cerebrovascular accident)     02/2011  . Hx of aortic valve replacement, mechanical   . H/O mitral valve replacement with mechanical valve   . HX: long  term anticoagulant use     Family History  family history includes Diabetes in his mother.  Prior Rehab/Hospitalizations: none   Current Medications  Current facility-administered medications:0.9 %  sodium chloride infusion, , Intravenous, Continuous, Lonia Blood, MD, Last Rate: 50 mL/hr at 07/16/13 0610;  acetaminophen (TYLENOL) suppository 650 mg, 650 mg, Rectal, Q6H PRN, Kela Millin, MD;  acetaminophen (TYLENOL) tablet 650 mg, 650 mg, Oral, Q6H PRN, Adeline C Viyuoh, MD, 650 mg at 07/16/13 0606 heparin ADULT infusion 100 units/mL (25000 units/250 mL), 950 Units/hr, Intravenous, Continuous, Lonia Blood, MD, Last Rate: 9.5 mL/hr at 07/16/13 0814, 950 Units/hr at 07/16/13 0814;  hydrochlorothiazide (MICROZIDE) capsule 12.5 mg, 12.5 mg, Oral, Daily, Linward Headland, MD;  metoCLOPramide (REGLAN) injection 5 mg, 5 mg, Intravenous, Q6H PRN, Lonia Blood, MD, 5 mg at 07/13/13 1706 ondansetron (ZOFRAN) injection 4 mg, 4 mg, Intravenous, Q4H PRN, Adeline C Viyuoh, MD, 4 mg at 07/11/13 1002;  sodium chloride 0.9 % injection 3 mL, 3 mL, Intravenous, Q12H, Adeline C Viyuoh, MD, 3 mL at 07/15/13 2208;  warfarin (COUMADIN) tablet 15 mg, 15 mg, Oral, ONCE-1800, Lonia Blood, MD;  Warfarin - Pharmacist Dosing Inpatient, , Does not apply, [id number], Lonia Blood, MD  Patients Current Diet: General  Precautions / Restrictions Precautions Precautions: Fall Restrictions Weight Bearing Restrictions: No   Prior Activity Level  Community (5-7x/wk): active ; 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  Pt ambulates frequently long distances. does not work, sister takes pt to grocery store  Current Functional Level Cognition  Overall Cognitive Status:  Impaired/Different from baseline Current Attention Level: Alternating Orientation Level: Oriented X4 Safety/Judgement: Decreased awareness of safety;Decreased awareness of deficits General Comments: decreased spatial awareness and proprioception deficits    Extremity Assessment (includes Sensation/Coordination)          ADLs  Eating/Feeding: Modified independent Where Assessed - Eating/Feeding: Chair Grooming: Wash/dry hands;Wash/dry face;Teeth care;Set up Where Assessed - Grooming: Unsupported sitting Upper Body Bathing: Minimal assistance Where Assessed - Upper Body Bathing: Unsupported sitting Lower Body Bathing: Moderate assistance Where Assessed - Lower Body Bathing: Supported sit to stand Upper Body Dressing: Set up Where Assessed - Upper Body Dressing: Unsupported sitting Lower Body Dressing: Moderate assistance Where Assessed - Lower Body Dressing: Supported sit to Pharmacist, hospital: Minimal Dentist Method: Sit to Barista: Bedside commode Toileting - Clothing Manipulation and Hygiene: Moderate assistance Where Assessed - Toileting Clothing Manipulation and Hygiene: Standing Equipment Used: Gait belt Transfers/Ambulation Related to ADLs: min assist with momentary stand bed to chair, some truncal ataxia. ADL Comments: mod assist for standing balance.    Mobility  Bed Mobility: Not assessed Supine to Sit: 5: Supervision    Transfers  Transfers: Sit to Stand;Stand to Sit;Stand Pivot Transfers Sit to Stand: 4: Min assist;With upper extremity assist Stand to Sit: 4: Min assist;With upper extremity assist;To chair/3-in-1    Ambulation / Gait / Stairs / Psychologist, prison and probation services  Ambulation/Gait Ambulation/Gait Assistance: 3: Mod assist Ambulation Distance (Feet): 120 Feet Assistive device: Rolling walker Ambulation/Gait Assistance Details: max tactile cues at hips to stimulate appropriate mm groups to sequencing gait pattern  properly (1 person for line management) Gait Pattern: Step-through pattern;Decreased stride length;Narrow base of support Gait velocity: slow Stairs: No    Posture / Balance Static Sitting Balance Static Sitting - Balance Support: Bilateral upper extremity supported Static Sitting - Level of Assistance: 5: Stand by assistance Static Sitting - Comment/# of Minutes: 5. without UE support pt with lateral sway L/R and report of dizziness Dynamic Sitting Balance Dynamic Sitting - Balance Support: Feet supported;No upper extremity supported Dynamic Sitting - Level of Assistance: 4: Min assist Dynamic Sitting Balance - Compensations: with bil. UEs overhead, pt with truncal ataxia and falls to Rt.  Static Standing Balance Static Standing - Balance Support: Bilateral upper extremity supported Static Standing - Level of Assistance: 1: +2 Total assist Static Standing - Comment/# of Minutes: 10 min Single Leg Stance - Right Leg:  (hold for 5 sec with assist at hips for weight shift) Single Leg Stance - Left Leg:  (hold for 5 sec with assist at hips for weight shift)    Special needs/care consideration Continuous Drip IV IV Heparin Bowel mgmt: continent Bladder mgmt: continent   Previous Home Environment Living Arrangements: Alone  Lives With: Alone Available Help at Discharge: Family;Neighbor;Available 24 hours/day Type of Home: House Home Layout: One level Home Access: Level entry Bathroom Shower/Tub: Tub/shower unit;Curtain Firefighter: Standard Home Care Services: No  Discharge Living Setting Plans for Discharge Living Setting: Lives with (comment);Other (Comment) (can go to his own home or go home with his daughter if neede) Type of Home at Discharge: [facility] Discharge  Home Layout: One level Discharge Home Access: Level entry Discharge Bathroom Shower/Tub: Tub/shower unit Discharge Bathroom Toilet: Standard Does the patient have any problems obtaining your medications?: Yes  (Describe) (has no insurance)  Social/Family/Support Systems Patient Roles: Parent Contact Information: Laurel Dimmer, sister Anticipated Caregiver: sister and daughters prn Anticipated Caregiver's Contact Information: see above Ability/Limitations of Caregiver: prn for sister and daughter work, neighbor may help Caregiver Availability: Intermittent Discharge Plan Discussed with Primary Caregiver: Yes Is Caregiver In Agreement with Plan?: Yes Does Caregiver/Family have Issues with Lodging/Transportation while Pt is in Rehab?: No  Goals/Additional Needs Patient/Family Goal for Rehab: Mod I PT and OT Expected length of stay: ELOS 7 to 10 days Additional Information: Pt's wife died 10 yrs ago from multiple CVAs and ruptured aneurysm. Daughters and pt seem scared for his outcome Pt/Family Agrees to Admission and willing to participate: Yes Program Orientation Provided & Reviewed with Pt/Caregiver Including Roles  & Responsibilities: Yes  Decrease burden of Care through IP rehab admission: n/a  Possible need for SNF placement upon discharge:n/a  Patient Condition: This patient's medical and functional status has changed since the consult dated: 07/14/2013 in which the Rehabilitation Physician determined and documented that the patient's condition is appropriate for intensive rehabilitative care in an inpatient rehabilitation facility. See "History of Present Illness" (above) for medical update. Functional changes are: overall min to mod assist. Patient's medical and functional status update has been discussed with the Rehabilitation physician and patient remains appropriate for inpatient rehabilitation. Will admit to inpatient rehab today.  Preadmission Screen Completed By:  Clois Dupes, 07/16/2013 10:31 AM ______________________________________________________________________   Discussed status with Dr. Riley Kill on 07/16/13 at  1031 and received telephone approval for admission  today.  Admission Coordinator:  Clois Dupes, time 07/16/13 Date 1031.

## 2013-07-15 NOTE — Progress Notes (Signed)
Patient ID: Jeff [name]  male  ZOX:[id number]    DOB: 18-Sep-1955    DOA: 07/10/2013  PCP: No primary provider on file.  Assessment/Plan:  Large L acute cerebellar ischemic infarction with mass effect with effacement of fourth ventricle  - No signs of hydrocephalus per imaging thus far, but hemorrhagic conversion on CT 12/1 - Remain on neuroICU due to risk of acute decompensation requiring emergent neurosurgery - cont heparin, start Coumadin with IV heparin bridge per neurology today -MRI brain still pending   Acute kidney injury  Resolved  with IV fluids   DH / volume depletion  Clinically resolved w/ IVF - good po intake   S/P mechanical aortic and mitral valve replacments  INR subtherapeutic at time of admit - now covering w/ IV heparin - see discussion above   Hypertension: - Closely monitor, avoid precipitous lowering BP  Disposition Plan: Neruro ICU until neuro status stable (at risk for herniation or hydrocephalus)   Procedures:  B carotid dopplers - no signif stenosis  TTE - normal systolic fxn, EF 81-19%, no regional WMA - no evidence of issues w/ mechanical valves   DVT Prophylaxis:Heparin drip, started on Coumadin today  Code Status: Full code  Disposition: Neuro ICU until neuro status stable (at risk for herniation or hydrocephalus)   Family communication: Discussed with the patient and family member in the room   Subjective: Patient seen and examined, alert awake and watching TV, family member at the bedside, denies any specific complaints. Denies any headache, drowsiness, visual changes, nausea vomiting.  Objective: Weight change:   Intake/Output Summary (Last 24 hours) at 07/15/13 1220 Last data filed at 07/15/13 0800  Gross per 24 hour  Intake 1807.74 ml  Output   1475 ml  Net 332.74 ml   Blood pressure 165/113, pulse 85, temperature 98.9 F (37.2 C), temperature source Oral, resp. rate 18, height 6\' 6"  (1.981 m), weight 79.3 kg (174 lb 13.2 oz), SpO2  100.00%.  Physical Exam: General: Alert and awake, oriented x3, not in any acute distress. CVS: S1-S2 clear, no murmur rubs or gallops Chest: clear to auscultation bilaterally, no wheezing, rales or rhonchi Abdomen: soft nontender, nondistended, normal bowel sounds  Extremities: no cyanosis, clubbing or edema noted bilaterally Neuro: No dysarthria, severe dysmetria on the left strength 5/5 in all 4 extremities  Lab Results: Basic Metabolic Panel:  Recent Labs Lab 07/11/13 0550 07/12/13 0440  NA 138 139  K 4.4 4.1  CL 104 106  CO2 23 23  GLUCOSE 109* 103*  BUN 12 10  CREATININE 1.11 1.13  CALCIUM 8.8 8.6   Liver Function Tests:  Recent Labs Lab 07/10/13 1300 07/11/13 0550  AST 37 23  ALT 19 14  ALKPHOS 66 74  BILITOT 0.5 0.4  PROT 6.3 6.5  ALBUMIN 3.5 3.5    Recent Labs Lab 07/10/13 1807  LIPASE 21   No results found for this basename: AMMONIA,  in the last 168 hours CBC:  Recent Labs Lab 07/10/13 1300  07/14/13 1020 07/15/13 0327  WBC 10.0  < > 6.9 6.8  NEUTROABS 6.7  --   --   --   HGB 15.4  < > 13.9 13.1  HCT 44.3  < > 40.1 37.1*  MCV 93.3  < > 92.6 92.8  PLT 245  < > 266 250  < > = values in this interval not displayed. Cardiac Enzymes:  Recent Labs Lab 07/10/13 1807 07/10/13 2345 07/11/13 0550  TROPONINI <0.30 <0.30 <0.30  BNP: No components found with this basename: POCBNP,  CBG: No results found for this basename: GLUCAP,  in the last 168 hours   Micro Results: Recent Results (from the past 240 hour(s))  MRSA PCR SCREENING     Status: None   Collection Time    07/13/13  3:00 AM      Result Value Range Status   MRSA by PCR NEGATIVE  NEGATIVE Final   Comment:            The GeneXpert MRSA Assay (FDA     approved for NASAL specimens     only), is one component of a     comprehensive MRSA colonization     surveillance program. It is not     intended to diagnose MRSA     infection nor to guide or     monitor treatment for      MRSA infections.    Studies/Results: Ct Angio Head W/cm &/or Wo Cm  07/12/2013   CLINICAL DATA:  Ataxia, suspect cerebral vascular accident  EXAM: CT ANGIOGRAPHY HEAD  TECHNIQUE: Multidetector CT imaging of the head was performed using the standard protocol during bolus administration of intravenous contrast. Multiplanar CT image reconstructions including MIPs were obtained to evaluate the vascular anatomy.  CONTRAST:  OMNIPAQUE IOHEXOL 350 MG/ML SOLN  COMPARISON:  Prior study from 02/16/2011.  FINDINGS: Precontrast images of the brain demonstrating multiple cortical and subcortical wedge-shaped hypodensities involving both frontal lobes as well as the right frontoparietal region, not significantly changed relative to prior examination. Additional wedge-shaped hypodensity and encephalomalacia within the right cerebral hemisphere is compatible with remote infarct as well.  There is new vague hypodensity measuring 4.5 x 4.5 cm involving the left cerebellar hemisphere (series 5, image 24). Finding is compatible with acute ischemic infarct. There is localized mass effect with effacement of the 4th ventricle. The cerebellar vermis is involved on the left. Ventricles are stable in size without evidence of hydrocephalus. There is crowding of the basilar cisterns with the cerebellar tonsils low lying in the foramen magnum. No acute intracranial hemorrhage identified. No extra-axial fluid collection. No mass lesion identified.  Calvarium is intact. Lipoma within the left frontal scalp is unchanged. Orbital soft tissues are within normal limits. Paranasal sinuses and mastoid air cells are clear.  Post-contrast CTA images of the brain are somewhat limited due to timing of the contrast bolus.  The distal vertebral arteries are not well visualized. The vertebrobasilar junction and basilar artery are widely patent. No basilar tip aneurysm The posterior cerebral arteries and superior cerebellar arteries are grossly  patent. Anterior inferior cerebellar arteries are not well visualized, and are likely hypoplastic. The posterior cerebral arteries are grossly patent.  The visualized petrous, cavernous, and supra clinoid segments of the internal carotid arteries are widely patent bilaterally without high-grade stenosis. The A1 segments are symmetric and patent bilaterally. Anterior communicating artery is grossly normal. The A2 segments are widely patent. The middle cerebral arteries are well opacified without high-grade stenosis or other abnormality. No intracranial aneurysm seen within the anterior circulation. Posterior communicating arteries are not well evaluated.  Review of the MIP images confirms the above findings.  IMPRESSION: 1. Ischemic left cerebellar infarct with involvement of the cerebellar vermis. There is secondary effacement of the 4th ventricle with basilar system crowding and inferior displacement of the cerebellar tonsils into the foramen magnum. No hydrocephalus at this time. No evidence of intracranial hemorrhage. 2. Similar appearance of remote ischemic infarcts involving the bilateral  frontal lobes, right frontoparietal region, and right cerebellar hemisphere. 3. Somewhat limited CTA without evidence of high-grade stenosis, occlusion, or intracranial aneurysm. Critical Value/emergent results were called by telephone at the time of interpretation on 07/12/2013 at 10:49 PM to Dr.Tom Claiborne Billings , who verbally acknowledged these results.   Electronically Signed   By: Rise Mu M.D.   On: 07/12/2013 22:54   Ct Head Wo Contrast  07/14/2013   CLINICAL DATA:  Follow-up. History of infarcts. No change. Cardiac valve replacement.  EXAM: CT HEAD WITHOUT CONTRAST  TECHNIQUE: Contiguous axial images were obtained from the base of the skull through the vertex without intravenous contrast.  COMPARISON:  07/12/2013.  FINDINGS: Large left cerebellar/vermis infarct with hemorrhagic conversion and increase in mass  effect causing distortion of the midbrain and pons. Compression of the 4th ventricle. Patient is at risk for development of hydrocephalus. Currently, no hydrocephalus. Close followup recommended. There may be a small amount of subdural blood along the left tentorium and adjacent to the left petrous temporal bone. Cerebellar tonsils low low lying. The patient is not safe for lumbar puncture.  Remote infarction frontal lobes, posterior right frontal -parietal lobe junction and right cerebellum.  No intracranial mass lesion noted on this unenhanced exam.  Subcutaneous lipoma left posterior frontal region.  IMPRESSION: Large left cerebellar/vermis infarct with hemorrhagic conversion and increase in mass effect causing distortion of the midbrain and pons. Compression of the 4th ventricle. Patient is at risk for development of hydrocephalus. Currently, no hydrocephalus. Close followup recommended. There may be a small amount of subdural blood along the left tentorium and adjacent to the left petrous temporal bone. Cerebellar tonsils low low lying. The patient is not safe for lumbar puncture.  Remote infarction frontal lobes, posterior right frontal -parietal lobe junction and right cerebellum.  These results will be called to the ordering clinician or representative by the Radiologist Assistant, and communication documented in the PACS Dashboard.   Electronically Signed   By: Bridgett Larsson M.D.   On: 07/14/2013 07:50    Medications: Scheduled Meds: . sodium chloride  3 mL Intravenous Q12H  . Warfarin - Pharmacist Dosing Inpatient   Does not apply q1800      LOS: 5 days   RAI,RIPUDEEP M.D. [facility] 07/15/2013, 12:20 PM Pager: [phone]  If 7PM-7AM, please contact night-coverage [email or web address] Password [email or web address]

## 2013-07-15 NOTE — Progress Notes (Signed)
Stroke Team Progress Note  HISTORY Jeff [name] is an 57 y.o. male with a history of mitral and aortic heart valve replacements and chronic anticoagulation, and previous cerebral infarctions, admitted on 07/10/2013 with acute onset of nausea vomiting and ataxia. He reports he was last known well at 1500, though he arrived to [facility] [place] at 1248. Neuro consult was obtained 11/30 and pt was transferred to [facility]  NICU.  There is no previous history of stroke nor TIA. His INR was subtherapeutic on admission. He has since been given Lovenox and Coumadin increased. He has not experienced diplopia nor changes in speech. He has not attempted to walk since admission. Nausea and vomiting have subsided. CT scan of his head showed large left acute cerebellar ischemic infarction with mass effect with effacement of fourth ventricle as well as mild downward compression of the cerebellar tonsils. No signs of hydrocephalus were noted. Old right and left frontal infarctions are also noted. NIH stroke score was 2. Patient was not a TPA candidate secondary to duration of symptoms.   SUBJECTIVE No complaints. Sitting up in bed,talkative. No family at bedside.  OBJECTIVE Most recent Vital Signs: Filed Vitals:   07/15/13 0600 07/15/13 0700 07/15/13 0732 07/15/13 0800  BP: 135/90 129/80  145/94  Pulse: 80 77  84  Temp:   99 F (37.2 C)   TempSrc:   Oral   Resp: 13 14  16   Height:      Weight:      SpO2: 100% 99%  97%   CBG (last 3)  No results found for this basename: GLUCAP,  in the last 72 hours  IV Fluid Intake:   . sodium chloride 50 mL/hr at 07/14/13 1900  . heparin 700 Units/hr (07/15/13 0800)    MEDICATIONS  . sodium chloride  3 mL Intravenous Q12H  . Warfarin - Pharmacist Dosing Inpatient   Does not apply q1800   PRN:  acetaminophen, acetaminophen, metoCLOPramide (REGLAN) injection, ondansetron (ZOFRAN) IV  Diet:  General thin liquids Activity: OOB DVT Prophylaxis:  IV heparin  CLINICALLY SIGNIFICANT  STUDIES Basic Metabolic Panel:   Recent Labs Lab 07/11/13 0550 07/12/13 0440  NA 138 139  K 4.4 4.1  CL 104 106  CO2 23 23  GLUCOSE 109* 103*  BUN 12 10  CREATININE 1.11 1.13  CALCIUM 8.8 8.6   Liver Function Tests:   Recent Labs Lab 07/10/13 1300 07/11/13 0550  AST 37 23  ALT 19 14  ALKPHOS 66 74  BILITOT 0.5 0.4  PROT 6.3 6.5  ALBUMIN 3.5 3.5   CBC:   Recent Labs Lab 07/10/13 1300  07/14/13 1020 07/15/13 0327  WBC 10.0  < > 6.9 6.8  NEUTROABS 6.7  --   --   --   HGB 15.4  < > 13.9 13.1  HCT 44.3  < > 40.1 37.1*  MCV 93.3  < > 92.6 92.8  PLT 245  < > 266 250  < > = values in this interval not displayed. Coagulation:   Recent Labs Lab 07/10/13 1300 07/11/13 0550 07/12/13 0440  LABPROT 12.6 13.4 18.2*  INR 0.96 1.04 1.55*   Cardiac Enzymes:   Recent Labs Lab 07/10/13 1807 07/10/13 2345 07/11/13 0550  TROPONINI <0.30 <0.30 <0.30   Urinalysis:   Recent Labs Lab 07/10/13 1401  COLORURINE AMBER*  LABSPEC 1.022  PHURINE 5.5  GLUCOSEU 100*  HGBUR SMALL*  BILIRUBINUR NEGATIVE  KETONESUR NEGATIVE  PROTEINUR 30*  UROBILINOGEN 1.0  NITRITE NEGATIVE  LEUKOCYTESUR NEGATIVE  Lipid Panel    Component Value Date/Time   CHOL 139 02/16/2011 0550   TRIG 57 02/16/2011 0550   HDL 46 02/16/2011 0550   CHOLHDL 3.0 02/16/2011 0550   VLDL 11 02/16/2011 0550   LDLCALC 82 02/16/2011 0550   HgbA1C  Lab Results  Component Value Date   HGBA1C 5.2 07/12/2013    Urine Drug Screen:     Component Value Date/Time   LABOPIA NONE DETECTED 07/10/2013 1401   COCAINSCRNUR NONE DETECTED 07/10/2013 1401   LABBENZ NONE DETECTED 07/10/2013 1401   AMPHETMU NONE DETECTED 07/10/2013 1401   THCU POSITIVE* 07/10/2013 1401   LABBARB NONE DETECTED 07/10/2013 1401    Alcohol Level:   Recent Labs Lab 07/10/13 1300  ETH <11    CT Head  07/14/2013  Large left cerebellar/vermis infarct with hemorrhagic conversion and increase in mass effect causing distortion of the  midbrain and pons. Compression of the 4th ventricle. Patient is at risk for development of hydrocephalus. Currently, no hydrocephalus. Close followup recommended. There may be a small amount of subdural blood along the left tentorium and adjacent to the left petrous temporal bone. Cerebellar tonsils low low lying. The patient is not safe for lumbar puncture.  Remote infarction frontal lobes, posterior right frontal -parietal lobe junction and right cerebellum.    CT Angio Head 07/12/2013    1. Ischemic left cerebellar infarct with involvement of the cerebellar vermis. There is secondary effacement of the 4th ventricle with basilar system crowding and inferior displacement of the cerebellar tonsils into the foramen magnum. No hydrocephalus at this time. No evidence of intracranial hemorrhage. 2. Similar appearance of remote ischemic infarcts involving the bilateral frontal lobes, right frontoparietal region, and right cerebellar hemisphere. 3. Somewhat limited CTA without evidence of high-grade stenosis, occlusion, or intracranial aneurysm.   MRI of the brain    MRA of the brain  See CT angio head  2D Echocardiogram  The estimated ejection fraction was in the range of 55% to 60%. Aortic valve: A mechanical prosthesis was present.  Mitral valve: A mechanical prosthesis was present. Oscillating density in LV cavity most likely residual MV apparatus (unchanged comared to previous study). Ascending aorta appears to be severely dilated; suggest CTA to further assess.   Carotid Doppler   1-39% ICA stenosis. Vertebral artery flow is antegrade.   CXR    EKG  .   Therapy Recommendations CIR  Physical Exam General: The patient is alert and cooperative at the time of the examination. Skin: No significant peripheral edema is noted.  Neurologic Exam Mental status: The patient is oriented x 3. Cranial nerves: Facial symmetry is present. Speech is slightly dysarthric. Extraocular movements are full but with  saccadic dysmetria on left gaze. Visual fields are full. Motor: The patient has good strength in all 4 extremities. Sensory examination: Soft touch sensation on the face, arms, and legs. Coordination: The patient has good finger-nose-finger on the right, severe dysmetria on the left. The patient has dysmetria with heel-to-shin, normal on the right. Gait and station: The patient gait was not tested. Reflexes: Deep tendon reflexes are symmetric.  ASSESSMENT [name] is a 57 y.o. male presenting with sudden onset nausea, vomiting, ataxia after giving plasma. Stroke not recognized on arrival. Imaging confirms a left cerebellar infarct. The patient has significant cerebellar edema with effacement of the fourth ventricle. Today with mild hemorrhagic transformation (asymptomatic). Multiple old  bihemispheric strokes were seen. 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 Current infarct felt to be embolic secondary to mechanical heart valve with subtherapeutic INR. On warfarin prior to admission; INR subtherapeutic on admission. Now on warfarin and heparin for secondary stroke prevention. Patient with resultant ataxia, dizziness. He is awake and talkative.   Aortic valve replacement, mechanical  Mitral valve replacement  Long-term anticoagulation therapy due to mechanical heart vavle  Cigarette smoker  etoh use  Marijuana use  Hospital day # 5  TREATMENT/PLAN  Continue ICU level care due to risk for worsening given location of stroke, hemorrhagic transformation of IV hep and coumadin  Coumadin with IV heparin bridge  F/u MRI. CT in am if MRI not completed today  F/u lipids  Annie Main, MSN, RN, ANVP-BC, ANP-BC, [facility] Pager: [phone] 07/15/2013 8:48 AM This patient is critically ill and at significant risk of neurological worsening, death and care requires constant monitoring of vital signs, hemodynamics,respiratory and  cardiac monitoring,review of multiple databases, neurological assessment, discussion with family, other specialists and medical decision making of high complexity. I spent 30 minutes of neurocritical care time  in the care of  this patient. I have personally obtained a history, examined the patient, evaluated imaging results, and formulated the assessment and plan of care. I agree with the above.  Delia Heady, MD

## 2013-07-15 NOTE — Progress Notes (Signed)
I await medical readiness to admit pt to inpt rehab. I have arranged disability and medicaid applications with financial counselor per his request. [phone]

## 2013-07-15 NOTE — Progress Notes (Signed)
ANTICOAGULATION CONSULT NOTE Pharmacy Consult for Heparin / Coumadin Indication: mechanical MVR and AVR  No Known Allergies  Patient Measurements: Height: 6\' 6"  (198.1 cm) Weight: 174 lb 13.2 oz (79.3 kg) IBW/kg (Calculated) : 91.4  Vital Signs: Temp: 98.9 F (37.2 C) (12/02 1146) Temp src: Oral (12/02 1146) BP: 154/97 mmHg (12/02 1200) Pulse Rate: 79 (12/02 1200)  Labs:  Recent Labs  07/14/13 1020 07/14/13 2110 07/15/13 0327 07/15/13 1225  HGB 13.9  --  13.1  --   HCT 40.1  --  37.1*  --   PLT 266  --  250  --   LABPROT  --   --   --  16.9*  INR  --   --   --  1.41  HEPARINUNFRC 1.10* 0.43 0.64 0.38    Estimated Creatinine Clearance: 78.9 ml/min (by C-G formula based on Cr of 1.13).  Assessment: 57 y.o. male with mechanical AVR/MVR, new CVA, for heparin.  HL = 0.38, INR = 1.41  Goal of Therapy:  Heparin level 0.3-0.5 units/ml Monitor platelets by anticoagulation protocol: Yes   Plan:  Continue heparin at 700 units/hr Repeat Coumadin 10 mg po x 1 today  Thank you. Okey Regal, PharmD [phone]  07/15/2013,1:23 PM

## 2013-07-15 NOTE — Progress Notes (Signed)
ANTICOAGULATION CONSULT NOTE Pharmacy Consult for Heparin Indication: mechanical MVR and AVR  No Known Allergies  Patient Measurements: Height: 6\' 6"  (198.1 cm) Weight: 174 lb 13.2 oz (79.3 kg) IBW/kg (Calculated) : 91.4  Vital Signs: Temp: 98.6 F (37 C) (12/02 0400) Temp src: Oral (12/02 0400) BP: 143/87 mmHg (12/02 0100) Pulse Rate: 79 (12/02 0100)  Labs:  Recent Labs  07/12/13 0440  07/14/13 1020 07/14/13 2110 07/15/13 0327  HGB  --   --  13.9  --  13.1  HCT  --   --  40.1  --  37.1*  PLT  --   --  266  --  250  LABPROT 18.2*  --   --   --   --   INR 1.55*  --   --   --   --   HEPARINUNFRC  --   < > 1.10* 0.43 0.64  CREATININE 1.13  --   --   --   --   < > = values in this interval not displayed.  Estimated Creatinine Clearance: 78.9 ml/min (by C-G formula based on Cr of 1.13).  Assessment: 57 y.o. male with mechanical AVR/MVR, new CVA, for heparin.  NO bleeding or changes in neurological status per RN.  Goal of Therapy:  Heparin level 0.3-0.5 units/ml Monitor platelets by anticoagulation protocol: Yes   Plan:  Decrease heparin 700 units/hr  Geannie Risen, PharmD, BCPS  07/15/2013,4:17 AM

## 2013-07-15 NOTE — Progress Notes (Signed)
Physical Therapy Treatment Patient Details Name: Linzie Boursiquot MRN: [id number] DOB: 07-30-1956 Today's Date: 07/15/2013 Time: 1410-1436 PT Time Calculation (min): 26 min  PT Assessment / Plan / Recommendation  History of Present Illness 57 y.o. male past medical history significant for history of aortic /mitral valve replacement -mechanical valve who presents with nausea and vomitting. He went and donated blood, walked back home and sometime after he got home he suddenly developed nausea vomiting-multiple episodes nonbloody. He denies abdominal pain, dysuria diarrhea fevers and no constipation. He states that he had not eaten before going to give the blood and all he had all day with some candy. He denies any sick contacts. He admits to alcohol use -since that he drinks about a quart of beer once a week. he was seen in the ED and urine drug screen was positive for marijuana, or analysis was unremarkable for infection and alcohol level less than 11. His INR was subtherapeutic at 0.96. CT showed left cerebellar CVA   PT Comments   Pt con't to presenting with ataxia and difficulty sequencing and co-ordinating requiring assist for all transfers and ambulation. Pt con't to be motivated and an excellent candidate for CIR for maximal functional recovery.   Follow Up Recommendations  CIR     Does the patient have the potential to tolerate intense rehabilitation     Barriers to Discharge        Equipment Recommendations  Rolling walker with 5" wheels    Recommendations for Other Services Rehab consult  Frequency Min 4X/week   Progress towards PT Goals Progress towards PT goals: Progressing toward goals  Plan Current plan remains appropriate    Precautions / Restrictions Precautions Precautions: Fall Restrictions Weight Bearing Restrictions: No   Pertinent Vitals/Pain Denies pain    Mobility  Bed Mobility Bed Mobility: Not assessed Transfers Transfers: Sit to Stand;Stand to Sit;Stand  Pivot Transfers Sit to Stand: 4: Min assist;With upper extremity assist Stand to Sit: 4: Min assist;With upper extremity assist;To chair/3-in-1 Details for Transfer Assistance: pt with significant sway upon initial stand. pt instructed on gaze stabiliation to assist with stability Ambulation/Gait Ambulation/Gait Assistance: 3: Mod assist Ambulation Distance (Feet): 120 Feet Assistive device: Rolling walker Ambulation/Gait Assistance Details: max tactile cues at hips to stimulate appropriate mm groups to sequencing gait pattern properly (1 person for line management) Gait Pattern: Step-through pattern;Decreased stride length;Narrow base of support Gait velocity: slow Stairs: No Modified Rankin (Stroke Patients Only) Pre-Morbid Rankin Score: No symptoms Modified Rankin: Moderately severe disability    Exercises Other Exercises Other Exercises: worked on lateral and anterior/posterior weight shift, min/max tactile cues at hips. worked on slow and controlled marching in standing with UE supported and tactle cues at hips.   PT Diagnosis:    PT Problem List:   PT Treatment Interventions:     PT Goals (current goals can now be found in the care plan section)    Visit Information  Last PT Received On: 07/15/13 Assistance Needed: +2 (for higher level balance/safe ambulation) History of Present Illness: 57 y.o. male past medical history significant for history of aortic /mitral valve replacement -mechanical valve who presents with nausea and vomitting. He went and donated blood, walked back home and sometime after he got home he suddenly developed nausea vomiting-multiple episodes nonbloody. He denies abdominal pain, dysuria diarrhea fevers and no constipation. He states that he had not eaten before going to give the blood and all he had all day with some candy. He denies any sick  contacts. He admits to alcohol use -since that he drinks about a quart of beer once a week. he was seen in the ED and  urine drug screen was positive for marijuana, or analysis was unremarkable for infection and alcohol level less than 11. His INR was subtherapeutic at 0.96. CT showed left cerebellar CVA    Subjective Data  Subjective: Pt desiring to work with therapy   Cognition  Cognition Arousal/Alertness: Awake/alert Behavior During Therapy: WFL for tasks assessed/performed Overall Cognitive Status: Impaired/Different from baseline Area of Impairment: Awareness;Problem solving Safety/Judgement: Decreased awareness of safety;Decreased awareness of deficits Awareness: Emergent Problem Solving: Slow processing;Difficulty sequencing;Requires verbal cues;Requires tactile cues General Comments: decreased spatial awareness and proprioception deficits    Balance  Static Standing Balance Static Standing - Balance Support: Bilateral upper extremity supported Static Standing - Level of Assistance: 1: +2 Total assist Static Standing - Comment/# of Minutes: 10 min Single Leg Stance - Right Leg:  (hold for 5 sec with assist at hips for weight shift) Single Leg Stance - Left Leg:  (hold for 5 sec with assist at hips for weight shift)  End of Session PT - End of Session Equipment Utilized During Treatment: Gait belt Activity Tolerance: Patient tolerated treatment well Patient left: in bed;with call bell/phone within reach Nurse Communication: Mobility status   GP     Marcene Brawn 07/15/2013, 2:50 PM  Lewis Shock, PT, DPT Pager #: [phone] Office #: [phone]

## 2013-07-16 ENCOUNTER — Inpatient Hospital Stay (HOSPITAL_COMMUNITY): Payer: Medicaid Other

## 2013-07-16 ENCOUNTER — Inpatient Hospital Stay (HOSPITAL_COMMUNITY)
Admission: RE | Admit: 2013-07-16 | Discharge: 2013-07-29 | DRG: 945 | Disposition: A | Discharge status: Home Health | Payer: Medicaid Other | Source: Intra-hospital | Attending: Physical Medicine & Rehabilitation | Admitting: Physical Medicine & Rehabilitation

## 2013-07-16 DIAGNOSIS — F1210 Cannabis abuse, uncomplicated: Secondary | ICD-10-CM

## 2013-07-16 DIAGNOSIS — F17200 Nicotine dependence, unspecified, uncomplicated: Secondary | ICD-10-CM

## 2013-07-16 DIAGNOSIS — I619 Nontraumatic intracerebral hemorrhage, unspecified: Secondary | ICD-10-CM

## 2013-07-16 DIAGNOSIS — Z8673 Personal history of transient ischemic attack (TIA), and cerebral infarction without residual deficits: Secondary | ICD-10-CM

## 2013-07-16 DIAGNOSIS — I6340 Cerebral infarction due to embolism of unspecified cerebral artery: Secondary | ICD-10-CM

## 2013-07-16 DIAGNOSIS — K5900 Constipation, unspecified: Secondary | ICD-10-CM

## 2013-07-16 DIAGNOSIS — F1010 Alcohol abuse, uncomplicated: Secondary | ICD-10-CM

## 2013-07-16 DIAGNOSIS — Z602 Problems related to living alone: Secondary | ICD-10-CM

## 2013-07-16 DIAGNOSIS — R471 Dysarthria and anarthria: Secondary | ICD-10-CM

## 2013-07-16 DIAGNOSIS — Z7901 Long term (current) use of anticoagulants: Secondary | ICD-10-CM

## 2013-07-16 DIAGNOSIS — R112 Nausea with vomiting, unspecified: Secondary | ICD-10-CM

## 2013-07-16 DIAGNOSIS — I639 Cerebral infarction, unspecified: Secondary | ICD-10-CM | POA: Diagnosis present

## 2013-07-16 DIAGNOSIS — Z5189 Encounter for other specified aftercare: Principal | ICD-10-CM

## 2013-07-16 DIAGNOSIS — Z954 Presence of other heart-valve replacement: Secondary | ICD-10-CM

## 2013-07-16 DIAGNOSIS — I6350 Cerebral infarction due to unspecified occlusion or stenosis of unspecified cerebral artery: Principal | ICD-10-CM

## 2013-07-16 DIAGNOSIS — R279 Unspecified lack of coordination: Secondary | ICD-10-CM

## 2013-07-16 DIAGNOSIS — I10 Essential (primary) hypertension: Secondary | ICD-10-CM

## 2013-07-16 DIAGNOSIS — G936 Cerebral edema: Secondary | ICD-10-CM

## 2013-07-16 LAB — PROTIME-INR
INR: 1.34 (ref 0.00–1.49)
Prothrombin Time: 16.3 seconds — ABNORMAL HIGH (ref 11.6–15.2)

## 2013-07-16 LAB — CBC
Hemoglobin: 12.9 g/dL — ABNORMAL LOW (ref 13.0–17.0)
MCH: 32.3 pg (ref 26.0–34.0)
MCHC: 34.7 g/dL (ref 30.0–36.0)
Platelets: 289 10*3/uL (ref 150–400)
RBC: 4 MIL/uL — ABNORMAL LOW (ref 4.22–5.81)
WBC: 6 10*3/uL (ref 4.0–10.5)

## 2013-07-16 LAB — HEPARIN LEVEL (UNFRACTIONATED)
Heparin Unfractionated: 0.15 IU/mL — ABNORMAL LOW (ref 0.30–0.70)
Heparin Unfractionated: 0.16 IU/mL — ABNORMAL LOW (ref 0.30–0.70)

## 2013-07-16 MED ORDER — HYDROCHLOROTHIAZIDE 12.5 MG PO CAPS
12.5000 mg | ORAL_CAPSULE | Freq: Every day | ORAL | Status: DC
  Administered 2013-07-17 – 2013-07-29 (×13): 12.5 mg via ORAL
  Filled 2013-07-16 (×14): qty 1

## 2013-07-16 MED ORDER — BIOTENE DRY MOUTH MT LIQD
15.0000 mL | Freq: Two times a day (BID) | OROMUCOSAL | Status: DC
  Administered 2013-07-16 – 2013-07-29 (×25): 15 mL via OROMUCOSAL

## 2013-07-16 MED ORDER — WARFARIN SODIUM 7.5 MG PO TABS
15.0000 mg | ORAL_TABLET | Freq: Once | ORAL | Status: AC
  Administered 2013-07-16: 15 mg via ORAL
  Filled 2013-07-16: qty 2

## 2013-07-16 MED ORDER — HEPARIN (PORCINE) IN NACL 100-0.45 UNIT/ML-% IJ SOLN
1050.0000 [IU]/h | INTRAMUSCULAR | Status: DC
  Administered 2013-07-17: 1100 [IU]/h via INTRAVENOUS
  Administered 2013-07-18 – 2013-07-20 (×3): 1050 [IU]/h via INTRAVENOUS
  Filled 2013-07-16 (×6): qty 250

## 2013-07-16 MED ORDER — HEPARIN (PORCINE) IN NACL 100-0.45 UNIT/ML-% IJ SOLN
1150.0000 [IU]/h | INTRAMUSCULAR | Status: DC
  Filled 2013-07-16: qty 250

## 2013-07-16 MED ORDER — HYDROCHLOROTHIAZIDE 12.5 MG PO CAPS
12.5000 mg | ORAL_CAPSULE | Freq: Every day | ORAL | Status: DC
  Administered 2013-07-16: 12.5 mg via ORAL
  Filled 2013-07-16: qty 1

## 2013-07-16 MED ORDER — ONDANSETRON HCL 4 MG PO TABS: 4.0000 mg | ORAL_TABLET | Freq: Four times a day (QID) | ORAL | Status: DC | PRN

## 2013-07-16 MED ORDER — HEPARIN (PORCINE) IN NACL 100-0.45 UNIT/ML-% IJ SOLN: 950.0000 [IU]/h | INTRAMUSCULAR | Status: DC

## 2013-07-16 MED ORDER — SORBITOL 70 % SOLN: 30.0000 mL | Freq: Every day | Status: DC | PRN

## 2013-07-16 MED ORDER — WARFARIN SODIUM 7.5 MG PO TABS
15.0000 mg | ORAL_TABLET | Freq: Once | ORAL | Status: DC
  Filled 2013-07-16: qty 2

## 2013-07-16 MED ORDER — SODIUM CHLORIDE 0.9 % IV SOLN
INTRAVENOUS | Status: DC
  Administered 2013-07-17: 1000 mL via INTRAVENOUS

## 2013-07-16 MED ORDER — ACETAMINOPHEN 325 MG PO TABS
325.0000 mg | ORAL_TABLET | ORAL | Status: DC | PRN
  Administered 2013-07-17: 650 mg via ORAL
  Filled 2013-07-16: qty 2

## 2013-07-16 MED ORDER — WARFARIN - PHARMACIST DOSING INPATIENT: Freq: Every day | Status: DC

## 2013-07-16 MED ORDER — ONDANSETRON HCL 4 MG/2ML IJ SOLN: 4.0000 mg | Freq: Four times a day (QID) | INTRAMUSCULAR | Status: DC | PRN

## 2013-07-16 NOTE — Progress Notes (Signed)
ANTICOAGULATION CONSULT NOTE Pharmacy Consult for Heparin/Coumadin Indication: mechanical MVR and AVR  No Known Allergies  Patient Measurements: Height: 6\' 6"  (198.1 cm) Weight: 174 lb 13.2 oz (79.3 kg) IBW/kg (Calculated) : 91.4  Vital Signs: Temp: 98.1 F (36.7 C) (12/02 2000) Temp src: Oral (12/02 2000) BP: 154/97 mmHg (12/03 0400) Pulse Rate: 74 (12/03 0400)  Labs:  Recent Labs  07/14/13 1020  07/15/13 0327 07/15/13 1225 07/16/13 0311  HGB 13.9  --  13.1  --  12.9*  HCT 40.1  --  37.1*  --  37.2*  PLT 266  --  250  --  289  LABPROT  --   --   --  16.9* 16.3*  INR  --   --   --  1.41 1.34  HEPARINUNFRC 1.10*  < > 0.64 0.38 0.15*  < > = values in this interval not displayed.  Estimated Creatinine Clearance: 78.9 ml/min (by C-G formula based on Cr of 1.13).  Assessment: 57 y.o. male with mechanical AVR/MVR, new CVA, for anticoagulation.  NO bleeding or changes in neurological status per RN.  Prior Coumadin regimens were 10-15 mg daily.  Goal of Therapy:  Heparin level 0.3-0.5 units/ml INR 2.5-3.5 Monitor platelets by anticoagulation protocol: Yes   Plan:  Increase heparin 950 units/hr Check heparin level in 8 hours.  Coumadin 15 mg today  Geannie Risen, PharmD, BCPS  07/16/2013,5:18 AM

## 2013-07-16 NOTE — Progress Notes (Signed)
Patient ID: Jeff [name], male   DOB: [birth year], 57 y.o.   MRN: [id number] Asian is awake alert oriented conversing well. Some dysarthria noted, some pass pointing with left side.  Patient is to be transferred for rehabilitation today. Continues conservative care. These reconsult [place] if necessary.

## 2013-07-16 NOTE — H&P (Signed)
Physical Medicine and Rehabilitation Admission H&P  Chief Complaint   Patient presents with   .  Nausea   .  Emesis   :  Chief complaint: Poor balance with nausea  HPI:Jeff [name] is a 57 y.o.right handed male with history of aortic/ mitral valve replacement on chronic Coumadin therapy as well as CVA July of 2012. Presented 07/10/2013 with nausea vomiting and ataxia. Patient was independent prior to admission he does not drive. Cranial CT scan showed a large left acute cerebellar ischemic infarct with mass effect with effacement of the fourth ventricle as well as mild downward compression of the cerebellar tonsils. No signs of hydrocephalus. 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 CT angiogram of the head with no evidence of stenosis or occlusion. INR on admission of 0.96. By report patient has been compliant with his Coumadin. Patient did not receive TPA. Urine drug screen was positive for marijuana. Carotid Dopplers with no ICA stenosis. Echocardiogram with ejection fraction of 60% unchanged from prior studies. Patient remains on chronic Coumadin therapy as well as the addition of intravenous heparin until INR therapeutic. He is tolerating a regular consistency diet. Physical and occupational therapy evaluations completed 07/13/2013 with recommendations of physical medicine rehabilitation consult. Patient was felt to be a good candidate for inpatient rehabilitation services was admitted for comprehensive rehabilitation program  ROS Review of Systems  Cardiovascular: Positive for palpitations.  Gastrointestinal: Positive for constipation.  Musculoskeletal: Positive for myalgias.  All other systems reviewed and are negative  Past Medical History   Diagnosis  Date   .  History of CVA (cerebrovascular accident)      02/2011   .  Hx of aortic valve replacement, mechanical    .  H/O mitral valve replacement with mechanical valve    .  HX: long term anticoagulant use     Past Surgical  History   Procedure  Laterality  Date   .  Cardiac valve replacement      Family History   Problem  Relation  Age of Onset   .  Diabetes  Mother      Died before her 14 birthday    Social History: reports that he has been smoking Cigarettes. He has been smoking about 0.05 packs per day. He does not have any smokeless tobacco history on file. He reports that he drinks about 8.4 ounces of alcohol per week. He reports that he uses illicit drugs (Marijuana) about 7 times per week.  Allergies: No Known Allergies  Medications Prior to Admission   Medication  Sig  Dispense  Refill   .  warfarin (COUMADIN) 5 MG tablet  Take 5 mg by mouth daily.      Home:  Home Living  Family/patient expects to be discharged to:: Private residence  Living Arrangements: Alone  Available Help at Discharge: Family;Neighbor;Available 24 hours/day  Type of Home: House  Home Access: Level entry  Home Layout: One level  Home Equipment: None  Lives With: Alone  Functional History:  Prior Function  Comments: Pt does not drive. Pt ambulates frequently long distances. does not work, sister takes pt to grocery store  Functional Status:  Mobility:  Bed Mobility  Bed Mobility: Not assessed  Supine to Sit: 5: Supervision  Transfers  Transfers: Sit to Stand;Stand to Sit;Stand Pivot Transfers  Sit to Stand: 4: Min assist;With upper extremity assist  Stand to Sit: 4: Min assist;With upper extremity assist;To chair/3-in-1  Ambulation/Gait  Ambulation/Gait Assistance: 3: Mod assist  Ambulation Distance (  Feet): 120 Feet  Assistive device: Rolling walker  Ambulation/Gait Assistance Details: max tactile cues at hips to stimulate appropriate mm groups to sequencing gait pattern properly (1 person for line management)  Gait Pattern: Step-through pattern;Decreased stride length;Narrow base of support  Gait velocity: slow  Stairs: No   ADL:  ADL  Eating/Feeding: Modified independent  Where Assessed - Eating/Feeding:  Chair  Grooming: Wash/dry hands;Wash/dry face;Teeth care;Set up  Where Assessed - Grooming: Unsupported sitting  Upper Body Bathing: Minimal assistance  Where Assessed - Upper Body Bathing: Unsupported sitting  Lower Body Bathing: Moderate assistance  Where Assessed - Lower Body Bathing: Supported sit to stand  Upper Body Dressing: Set up  Where Assessed - Upper Body Dressing: Unsupported sitting  Lower Body Dressing: Moderate assistance  Where Assessed - Lower Body Dressing: Supported sit to Scientist, research (life sciences): Minimal assistance  Toilet Transfer Method: Sit to Production manager: Bedside commode  Equipment Used: Gait belt  Transfers/Ambulation Related to ADLs: min assist with momentary stand bed to chair, some truncal ataxia.  ADL Comments: mod assist for standing balance.  Cognition:  Cognition  Overall Cognitive Status: Impaired/Different from baseline  Orientation Level: Oriented X4  Cognition  Arousal/Alertness: Awake/alert  Behavior During Therapy: WFL for tasks assessed/performed  Overall Cognitive Status: Impaired/Different from baseline  Area of Impairment: Awareness;Problem solving  Current Attention Level: Alternating  Safety/Judgement: Decreased awareness of safety;Decreased awareness of deficits  Awareness: Emergent  Problem Solving: Slow processing;Difficulty sequencing;Requires verbal cues;Requires tactile cues  General Comments: decreased spatial awareness and proprioception deficits  Physical Exam:  Blood pressure 154/97, pulse 74, temperature 98.1 F (36.7 C), temperature source Oral, resp. rate 15, height 6\' 6"  (1.981 m), weight 79.3 kg (174 lb 13.2 oz), SpO2 98.00%.  Physical Exam  Constitutional: He is oriented to person, place, and time. He appears well-developed.  HENT:  Head: Normocephalic.  Poor dentition  Eyes: EOM are normal.  Neck: Normal range of motion. Neck supple. No thyromegaly present.  Cardiovascular:  Cardiac rate  controlled. No murmur Respiratory: Effort normal and breath sounds normal. No respiratory distress.  GI: Soft. Bowel sounds are normal. He exhibits no distension.  Neurological: He is alert and oriented to person, place, and time.  2 beats of nystagmus with left lateral gaze.  Left upper and lower limb ataxia. He leans to right when standing and sitting. Speech disarthric but intelligible. Strength 5/5 on right. LUE 4+ prox to distal. LLE 4 to 4+/5. Pronator drift LUE.  Skin: Skin is warm and dry.  Psychiatric: He has a normal mood and affect    Results for orders placed during the hospital encounter of 07/10/13 (from the past 48 hour(s))   CBC Status: None    Collection Time    07/14/13 10:20 AM   Result  Value  Range    WBC  6.9  4.0 - 10.5 K/uL    RBC  4.33  4.22 - 5.81 MIL/uL    Hemoglobin  13.9  13.0 - 17.0 g/dL    HCT  40.9  81.1 - 91.4 %    MCV  92.6  78.0 - 100.0 fL    MCH  32.1  26.0 - 34.0 pg    MCHC  34.7  30.0 - 36.0 g/dL    RDW  78.2  95.6 - 21.3 %    Platelets  266  150 - 400 K/uL   HEPARIN LEVEL (UNFRACTIONATED) Status: Abnormal    Collection Time    07/14/13  10:20 AM   Result  Value  Range    Heparin Unfractionated  1.10 (*)  0.30 - 0.70 IU/mL    Comment:      IF HEPARIN RESULTS ARE BELOW     EXPECTED VALUES, AND PATIENT     DOSAGE HAS BEEN CONFIRMED,     SUGGEST FOLLOW UP TESTING     OF ANTITHROMBIN III LEVELS.   HEPARIN LEVEL (UNFRACTIONATED) Status: None    Collection Time    07/14/13 9:10 PM   Result  Value  Range    Heparin Unfractionated  0.43  0.30 - 0.70 IU/mL    Comment:      IF HEPARIN RESULTS ARE BELOW     EXPECTED VALUES, AND PATIENT     DOSAGE HAS BEEN CONFIRMED,     SUGGEST FOLLOW UP TESTING     OF ANTITHROMBIN III LEVELS.   CBC Status: Abnormal    Collection Time    07/15/13 3:27 AM   Result  Value  Range    WBC  6.8  4.0 - 10.5 K/uL    RBC  4.00 (*)  4.22 - 5.81 MIL/uL    Hemoglobin  13.1  13.0 - 17.0 g/dL    HCT  16.1 (*)  09.6 -  52.0 %    MCV  92.8  78.0 - 100.0 fL    MCH  32.8  26.0 - 34.0 pg    MCHC  35.3  30.0 - 36.0 g/dL    RDW  04.5  40.9 - 81.1 %    Platelets  250  150 - 400 K/uL   HEPARIN LEVEL (UNFRACTIONATED) Status: None    Collection Time    07/15/13 3:27 AM   Result  Value  Range    Heparin Unfractionated  0.64  0.30 - 0.70 IU/mL    Comment:      IF HEPARIN RESULTS ARE BELOW     EXPECTED VALUES, AND PATIENT     DOSAGE HAS BEEN CONFIRMED,     SUGGEST FOLLOW UP TESTING     OF ANTITHROMBIN III LEVELS.   HEPARIN LEVEL (UNFRACTIONATED) Status: None    Collection Time    07/15/13 12:25 PM   Result  Value  Range    Heparin Unfractionated  0.38  0.30 - 0.70 IU/mL    Comment:      IF HEPARIN RESULTS ARE BELOW     EXPECTED VALUES, AND PATIENT     DOSAGE HAS BEEN CONFIRMED,     SUGGEST FOLLOW UP TESTING     OF ANTITHROMBIN III LEVELS.   PROTIME-INR Status: Abnormal    Collection Time    07/15/13 12:25 PM   Result  Value  Range    Prothrombin Time  16.9 (*)  11.6 - 15.2 seconds    INR  1.41  0.00 - 1.49   CBC Status: Abnormal    Collection Time    07/16/13 3:11 AM   Result  Value  Range    WBC  6.0  4.0 - 10.5 K/uL    RBC  4.00 (*)  4.22 - 5.81 MIL/uL    Hemoglobin  12.9 (*)  13.0 - 17.0 g/dL    HCT  91.4 (*)  78.2 - 52.0 %    MCV  93.0  78.0 - 100.0 fL    MCH  32.3  26.0 - 34.0 pg    MCHC  34.7  30.0 - 36.0 g/dL    RDW  95.6  21.3 -  15.5 %    Platelets  289  150 - 400 K/uL   HEPARIN LEVEL (UNFRACTIONATED) Status: Abnormal    Collection Time    07/16/13 3:11 AM   Result  Value  Range    Heparin Unfractionated  0.15 (*)  0.30 - 0.70 IU/mL    Comment:      IF HEPARIN RESULTS ARE BELOW     EXPECTED VALUES, AND PATIENT     DOSAGE HAS BEEN CONFIRMED,     SUGGEST FOLLOW UP TESTING     OF ANTITHROMBIN III LEVELS.   PROTIME-INR Status: Abnormal    Collection Time    07/16/13 3:11 AM   Result  Value  Range    Prothrombin Time  16.3 (*)  11.6 - 15.2 seconds    INR  1.34  0.00 - 1.49     No results found.  Post Admission Physician Evaluation:  1. Functional deficits secondary to embolic left cerebellar infarct. 2. Patient is admitted to receive collaborative, interdisciplinary care between the physiatrist, rehab nursing staff, and therapy team. 3. Patient's level of medical complexity and substantial therapy needs in context of that medical necessity cannot be provided at a lesser intensity of care such as a SNF. 4. Patient has experienced substantial functional loss from his/her baseline which was documented above under the "Functional History" and "Functional Status" headings. Judging by the patient's diagnosis, physical exam, and functional history, the patient has potential for functional progress which will result in measurable gains while on inpatient rehab. These gains will be of substantial and practical use upon discharge in facilitating mobility and self-care at the household level. 5. Physiatrist will provide 24 hour management of medical needs as well as oversight of the therapy plan/treatment and provide guidance as appropriate regarding the interaction of the two. 6. 24 hour rehab nursing will assist with bladder management, bowel management, safety, skin/wound care, disease management, medication administration, pain management and patient education and help integrate therapy concepts, techniques,education, etc. 7. PT will assess and treat for/with: Lower extremity strength, range of motion, stamina, balance, functional mobility, safety, adaptive techniques and equipment, education, NMR. Goals are: mod I. 8. OT will assess and treat for/with: ADL's, functional mobility, safety, upper extremity strength, adaptive techniques and equipment, NMR, education. Goals are: mod i to supervision. 9. SLP will assess and treat for/with: n/a 10. Case Management and Social Worker will assess and treat for psychological issues and discharge planning. 11. Team conference will be held  weekly to assess progress toward goals and to determine barriers to discharge. 12. Patient will receive at least 3 hours of therapy per day at least 5 days per week. 13. ELOS: 7-9 days  14. Prognosis: excellent   Medical Problem List and Plan:  1. Embolic left cerebellar infarct  2. DVT Prophylaxis/Anticoagulation: Chronic Coumadin therapy with history of aortic and mitral valve replacement. Continue heparin until INR greater than 2.5  3. Pain Management: Tylenol as needed  4. Neuropsych: This patient is capable of making decisions on his own behalf.  5. History of alcohol, marijuana and tobacco abuse. Provide full counseling   Ranelle Oyster, MD, [facility] Physical Medicine & Rehabilitation   07/16/2013

## 2013-07-16 NOTE — Progress Notes (Signed)
ANTICOAGULATION CONSULT NOTE Pharmacy Consult for Heparin Indication: mechanical MVR and AVR  No Known Allergies  Patient Measurements: Height: 6' (182.9 cm) Weight: 175 lb 8 oz (79.606 kg) IBW/kg (Calculated) : 77.6  Vital Signs: Temp: 98.2 F (36.8 C) (12/03 1410) Temp src: Oral (12/03 1410) BP: 160/112 mmHg (12/03 1410) Pulse Rate: 93 (12/03 1410)  Labs:  Recent Labs  07/14/13 1020  07/15/13 0327 07/15/13 1225 07/16/13 0311 07/16/13 1255 07/16/13 2145  HGB 13.9  --  13.1  --  12.9*  --   --   HCT 40.1  --  37.1*  --  37.2*  --   --   PLT 266  --  250  --  289  --   --   LABPROT  --   --   --  16.9* 16.3*  --   --   INR  --   --   --  1.41 1.34  --   --   HEPARINUNFRC 1.10*  < > 0.64 0.38 0.15* 0.16* 0.54  < > = values in this interval not displayed.  Estimated Creatinine Clearance: 77.3 ml/min (by C-G formula based on Cr of 1.13).  Assessment: 57 y.o. male with mechanical AVR/MVR, new CVA on heparin/coumadin. The heparin rate was increased this am to 1150 units/hr with a heparin level slightly above goal (HL= 0.54).  Goal of Therapy:  Heparin level 0.3-0.5 units/ml INR 2.5-3.5 Monitor platelets by anticoagulation protocol: Yes   Plan:   -Decrease heparin to 1100 units/hr -Will recheck a level in am   Harland German, Pharm D 07/16/2013 10:23 PM

## 2013-07-16 NOTE — Discharge Summary (Addendum)
Physician Discharge Summary  Jeff [name] VWU:[id number] DOB: 05-May-1956 DOA: 07/10/2013  PCP: No primary provider on file.  Admit date: 07/10/2013 Discharge date: 07/16/2013  Time spent: > 45 minutes  Recommendations for Outpatient Follow-up:  1. F/u with Neurology   Discharge Diagnoses:  Active Problems:   History of CVA (cerebrovascular accident)   Hx of aortic valve replacement, mechanical   H/O mitral valve replacement with mechanical valve   Vomiting   Nausea with vomiting   Subtherapeutic international normalized ratio (INR)   AKI (acute kidney injury)   Volume depletion   Nausea & vomiting   Ataxia   Cerebellar stroke, acute   Discharge Condition: stable   Diet recommendation: heart healthy  Filed Weights   07/13/13 0255 07/13/13 1200 07/14/13 0400  Weight: 79.5 kg (175 lb 4.3 oz) 78.9 kg (173 lb 15.1 oz) 79.3 kg (174 lb 13.2 oz)    History of present illness:  Jeff [name] is a 57 y.o. male past medical history significant for history of mechanical aortic and mitral valve on Coumadin. He was admitted to the hospital for vomiting after donating blood. This resolved after admission and was suspected to be due to a vagal response related to donating blood. He subsequently began to ambulate in the hospital and was noted to be Ataxic with symptoms of Vertigo. A work up for CVA was initiated and he was found on 11/29 on CT scan to have a large left cerebellar infarct with hemorrhagic conversion, mass effect on midbrain and pons and compression of 4th ventricle. Multiple remote infarcts were also noted. It is to be noted that the patient's INR was subtherapeutic on admission.   Hospital Course:  Large L acute cerebellar ischemic infarction with mass effect and effacement of fourth ventricle  - continues to have mild- mod vertigo and balance issues  - in retrospect, suspect vomiting may have been related to this CVA  - No signs of hydrocephalus per imaging thus far, but  hemorrhagic conversion on CT 12/1  - cont heparin, start Coumadin with IV heparin bridge per neurology today  -Per Neurology, he is safe to transfer to CIR oday with Heparin bridge.   Acute kidney injury -DH / volume depletion  - due to vomiting/ reaction during blood transfusion - Resolved with IV fluids   S/P mechanical aortic and mitral valve replacments  INR subtherapeutic at time of admit - now covering w/ IV heparin - see discussion above   Hypertension:  - Closely monitor, avoid precipitous lowering BP   Procedures: 11/29 -  Bilateral carotid artery duplex: 1-39% ICA stenosis. Vertebral artery flow is antegrade.      Consultations:  Neurology   Discharge Exam: Filed Vitals:   07/16/13 1000  BP: 158/71  Pulse: 88  Temp:   Resp: 25    General: AAO x 3- sitting in chair, eating lunch Cardiovascular: RRR, audible clicks from metallic valves Respiratory: CTA b/l   Discharge Instructions      Discharge Orders   Future Orders Complete By Expires   Diet - low sodium heart healthy  As directed    Increase activity slowly  As directed        Medication List         heparin 100-0.45 UNIT/ML-% infusion  Inject 950 Units/hr into the vein continuous.     warfarin 5 MG tablet  Commonly known as:  COUMADIN  Take 5 mg by mouth daily.       No Known Allergies Follow-up Information  Follow up with [name], MD In 2 months. (in stroke clinic)    Specialties:  Neurology, Radiology   Contact information:   [address] [place] [address] [phone]        The results of significant diagnostics from this hospitalization (including imaging, microbiology, ancillary and laboratory) are listed below for reference.    Significant Diagnostic Studies: Ct Angio Head W/cm &/or Wo Cm  07/12/2013   CLINICAL DATA:  Ataxia, suspect cerebral vascular accident  EXAM: CT ANGIOGRAPHY HEAD  TECHNIQUE: Multidetector CT imaging of the head was  performed using the standard protocol during bolus administration of intravenous contrast. Multiplanar CT image reconstructions including MIPs were obtained to evaluate the vascular anatomy.  CONTRAST:  OMNIPAQUE IOHEXOL 350 MG/ML SOLN  COMPARISON:  Prior study from 02/16/2011.  FINDINGS: Precontrast images of the brain demonstrating multiple cortical and subcortical wedge-shaped hypodensities involving both frontal lobes as well as the right frontoparietal region, not significantly changed relative to prior examination. Additional wedge-shaped hypodensity and encephalomalacia within the right cerebral hemisphere is compatible with remote infarct as well.  There is new vague hypodensity measuring 4.5 x 4.5 cm involving the left cerebellar hemisphere (series 5, image 24). Finding is compatible with acute ischemic infarct. There is localized mass effect with effacement of the 4th ventricle. The cerebellar vermis is involved on the left. Ventricles are stable in size without evidence of hydrocephalus. There is crowding of the basilar cisterns with the cerebellar tonsils low lying in the foramen magnum. No acute intracranial hemorrhage identified. No extra-axial fluid collection. No mass lesion identified.  Calvarium is intact. Lipoma within the left frontal scalp is unchanged. Orbital soft tissues are within normal limits. Paranasal sinuses and mastoid air cells are clear.  Post-contrast CTA images of the brain are somewhat limited due to timing of the contrast bolus.  The distal vertebral arteries are not well visualized. The vertebrobasilar junction and basilar artery are widely patent. No basilar tip aneurysm The posterior cerebral arteries and superior cerebellar arteries are grossly patent. Anterior inferior cerebellar arteries are not well visualized, and are likely hypoplastic. The posterior cerebral arteries are grossly patent.  The visualized petrous, cavernous, and supra clinoid segments of the internal  carotid arteries are widely patent bilaterally without high-grade stenosis. The A1 segments are symmetric and patent bilaterally. Anterior communicating artery is grossly normal. The A2 segments are widely patent. The middle cerebral arteries are well opacified without high-grade stenosis or other abnormality. No intracranial aneurysm seen within the anterior circulation. Posterior communicating arteries are not well evaluated.  Review of the MIP images confirms the above findings.  IMPRESSION: 1. Ischemic left cerebellar infarct with involvement of the cerebellar vermis. There is secondary effacement of the 4th ventricle with basilar system crowding and inferior displacement of the cerebellar tonsils into the foramen magnum. No hydrocephalus at this time. No evidence of intracranial hemorrhage. 2. Similar appearance of remote ischemic infarcts involving the bilateral frontal lobes, right frontoparietal region, and right cerebellar hemisphere. 3. Somewhat limited CTA without evidence of high-grade stenosis, occlusion, or intracranial aneurysm. Critical Value/emergent results were called by telephone at the time of interpretation on 07/12/2013 at 10:49 PM to Dr.Tom Claiborne Billings , who verbally acknowledged these results.   Electronically Signed   By: Rise Mu M.D.   On: 07/12/2013 22:54   Ct Head Wo Contrast  07/16/2013   CLINICAL DATA:  Stroke.  Received TPA.  EXAM: CT HEAD WITHOUT CONTRAST  TECHNIQUE: Contiguous axial images were obtained  from the base of the skull through the vertex without intravenous contrast.  COMPARISON:  CT 07/14/2013  FINDINGS: Large area of acute infarct left cerebellum with edema and mass effect on the 4th ventricle. Mild amount of hemorrhage within the infarct is unchanged.  The 4th ventricle is displaced to the right. Third and lateral ventricles show mild progressive dilatation compatible with obstructive hydrocephalus. This shows mild progression from 07/14/2013 and has  definitely progressed from the admitting CT of 07/12/2013.  Chronic infarcts in the frontal lobes bilaterally and in the right parietal lobe. Chronic right cerebellar infarct.  IMPRESSION: Hemorrhagic infarct left cerebellum is stable.  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   Electronically Signed   By: Marlan Palau M.D.   On: 07/16/2013 09:18   Ct Head Wo Contrast  07/14/2013   CLINICAL DATA:  Follow-up. History of infarcts. No change. Cardiac valve replacement.  EXAM: CT HEAD WITHOUT CONTRAST  TECHNIQUE: Contiguous axial images were obtained from the base of the skull through the vertex without intravenous contrast.  COMPARISON:  07/12/2013.  FINDINGS: Large left cerebellar/vermis infarct with hemorrhagic conversion and increase in mass effect causing distortion of the midbrain and pons. Compression of the 4th ventricle. Patient is at risk for development of hydrocephalus. Currently, no hydrocephalus. Close followup recommended. There may be a small amount of subdural blood along the left tentorium and adjacent to the left petrous temporal bone. Cerebellar tonsils low low lying. The patient is not safe for lumbar puncture.  Remote infarction frontal lobes, posterior right frontal -parietal lobe junction and right cerebellum.  No intracranial mass lesion noted on this unenhanced exam.  Subcutaneous lipoma left posterior frontal region.  IMPRESSION: Large left cerebellar/vermis infarct with hemorrhagic conversion and increase in mass effect causing distortion of the midbrain and pons. Compression of the 4th ventricle. Patient is at risk for development of hydrocephalus. Currently, no hydrocephalus. Close followup recommended. There may be a small amount of subdural blood along the left tentorium and adjacent to the left petrous temporal bone. Cerebellar tonsils low low lying. The patient is not safe for lumbar puncture.  Remote infarction frontal lobes, posterior right frontal  -parietal lobe junction and right cerebellum.  These results will be called to the ordering clinician or representative by the Radiologist Assistant, and communication documented in the PACS Dashboard.   Electronically Signed   By: Bridgett Larsson M.D.   On: 07/14/2013 07:50    Microbiology: Recent Results (from the past 240 hour(s))  MRSA PCR SCREENING     Status: None   Collection Time    07/13/13  3:00 AM      Result Value Range Status   MRSA by PCR NEGATIVE  NEGATIVE Final   Comment:            The GeneXpert MRSA Assay (FDA     approved for NASAL specimens     only), is one component of a     comprehensive MRSA colonization     surveillance program. It is not     intended to diagnose MRSA     infection nor to guide or     monitor treatment for     MRSA infections.     Labs: Basic Metabolic Panel:  Recent Labs Lab 07/10/13 1300 07/11/13 0550 07/12/13 0440  NA 139 138 139  K 4.2 4.4 4.1  CL 106 104 106  CO2 19 23 23   GLUCOSE 201* 109* 103*  BUN 14 12 10  CREATININE 1.40* 1.11 1.13  CALCIUM 9.0 8.8 8.6   Liver Function Tests:  Recent Labs Lab 07/10/13 1300 07/11/13 0550  AST 37 23  ALT 19 14  ALKPHOS 66 74  BILITOT 0.5 0.4  PROT 6.3 6.5  ALBUMIN 3.5 3.5    Recent Labs Lab 07/10/13 1807  LIPASE 21   No results found for this basename: AMMONIA,  in the last 168 hours CBC:  Recent Labs Lab 07/10/13 1300 07/11/13 0550 07/14/13 1020 07/15/13 0327 07/16/13 0311  WBC 10.0 10.8* 6.9 6.8 6.0  NEUTROABS 6.7  --   --   --   --   HGB 15.4 14.0 13.9 13.1 12.9*  HCT 44.3 41.1 40.1 37.1* 37.2*  MCV 93.3 94.7 92.6 92.8 93.0  PLT 245 248 266 250 289   Cardiac Enzymes:  Recent Labs Lab 07/10/13 1807 07/10/13 2345 07/11/13 0550  TROPONINI <0.30 <0.30 <0.30   BNP: BNP (last 3 results) No results found for this basename: PROBNP,  in the last 8760 hours CBG: No results found for this basename: GLUCAP,  in the last 168  hours     Signed:  Taylee Gunnells  [facility] 07/16/2013, 12:45 PM

## 2013-07-16 NOTE — Progress Notes (Addendum)
Stroke Team Progress Note  HISTORY Jeff [name] is an 57 y.o. male with a history of mitral and aortic heart valve replacements and chronic anticoagulation, and previous cerebral infarctions, admitted on 07/10/2013 with acute onset of nausea vomiting and ataxia. He reports he was last known well at 1500, though he arrived to [facility] at 1248. Neuro consult was obtained 11/30 and pt was transferred to [facility]  NICU.  There is no previous history of stroke nor TIA. His INR was subtherapeutic on admission. He has since been given heparin drip and Coumadin increased. He has not experienced diplopia nor changes in speech.  Nausea and vomiting have subsided. CT scan of his head showed large left acute cerebellar ischemic infarction with mass effect with effacement of fourth ventricle as well as mild downward compression of the cerebellar tonsils. No signs of hydrocephalus were noted. Old right and left frontal infarctions are also noted. NIH stroke score was 2. Patient was not a TPA candidate secondary to duration of symptoms.   SUBJECTIVE No acute events overnight.  Patient notes no acute complaints.  CT head this AM shows no evidence of further bleeding.  Hb stable at 12.9 (from 13.1 yesterday).  BP mildly elevated this morning.  Stable for transfer to CIR today.  OBJECTIVE Most recent Vital Signs: Filed Vitals:   07/16/13 0400 07/16/13 0500 07/16/13 0600 07/16/13 0700  BP: 154/97 158/99 168/110 152/97  Pulse: 74 78 80 77  Temp:    98.2 F (36.8 C)  TempSrc:    Oral  Resp: 15 15 14 14   Height:      Weight:      SpO2: 98% 99% 100% 100%   CBG (last 3)  No results found for this basename: GLUCAP,  in the last 72 hours  IV Fluid Intake:   . sodium chloride 50 mL/hr at 07/16/13 0610  . heparin 950 Units/hr (07/16/13 0603)    MEDICATIONS  . sodium chloride  3 mL Intravenous Q12H  . warfarin  15 mg Oral ONCE-1800  . Warfarin - Pharmacist Dosing Inpatient   Does not apply q1800   PRN:  acetaminophen,  acetaminophen, metoCLOPramide (REGLAN) injection, ondansetron (ZOFRAN) IV  Diet:  General thin liquids Activity: OOB DVT Prophylaxis:  IV heparin  CLINICALLY SIGNIFICANT STUDIES Basic Metabolic Panel:   Recent Labs Lab 07/11/13 0550 07/12/13 0440  NA 138 139  K 4.4 4.1  CL 104 106  CO2 23 23  GLUCOSE 109* 103*  BUN 12 10  CREATININE 1.11 1.13  CALCIUM 8.8 8.6   Liver Function Tests:   Recent Labs Lab 07/10/13 1300 07/11/13 0550  AST 37 23  ALT 19 14  ALKPHOS 66 74  BILITOT 0.5 0.4  PROT 6.3 6.5  ALBUMIN 3.5 3.5   CBC:   Recent Labs Lab 07/10/13 1300  07/15/13 0327 07/16/13 0311  WBC 10.0  < > 6.8 6.0  NEUTROABS 6.7  --   --   --   HGB 15.4  < > 13.1 12.9*  HCT 44.3  < > 37.1* 37.2*  MCV 93.3  < > 92.8 93.0  PLT 245  < > 250 289  < > = values in this interval not displayed. Coagulation:   Recent Labs Lab 07/11/13 0550 07/12/13 0440 07/15/13 1225 07/16/13 0311  LABPROT 13.4 18.2* 16.9* 16.3*  INR 1.04 1.55* 1.41 1.34   Cardiac Enzymes:   Recent Labs Lab 07/10/13 1807 07/10/13 2345 07/11/13 0550  TROPONINI <0.30 <0.30 <0.30   Urinalysis:  Recent Labs Lab 07/10/13 1401  COLORURINE AMBER*  LABSPEC 1.022  PHURINE 5.5  GLUCOSEU 100*  HGBUR SMALL*  BILIRUBINUR NEGATIVE  KETONESUR NEGATIVE  PROTEINUR 30*  UROBILINOGEN 1.0  NITRITE NEGATIVE  LEUKOCYTESUR NEGATIVE   Lipid Panel    Component Value Date/Time   CHOL 139 02/16/2011 0550   TRIG 57 02/16/2011 0550   HDL 46 02/16/2011 0550   CHOLHDL 3.0 02/16/2011 0550   VLDL 11 02/16/2011 0550   LDLCALC 82 02/16/2011 0550   HgbA1C  Lab Results  Component Value Date   HGBA1C 5.2 07/12/2013    Urine Drug Screen:     Component Value Date/Time   LABOPIA NONE DETECTED 07/10/2013 1401   COCAINSCRNUR NONE DETECTED 07/10/2013 1401   LABBENZ NONE DETECTED 07/10/2013 1401   AMPHETMU NONE DETECTED 07/10/2013 1401   THCU POSITIVE* 07/10/2013 1401   LABBARB NONE DETECTED 07/10/2013 1401    Alcohol  Level:   Recent Labs Lab 07/10/13 1300  ETH <11    CT Head  07/14/2013  Large left cerebellar/vermis infarct with hemorrhagic conversion and increase in mass effect causing distortion of the midbrain and pons. Compression of the 4th ventricle. Patient is at risk for development of hydrocephalus. Currently, no hydrocephalus. Close followup recommended. There may be a small amount of subdural blood along the left tentorium and adjacent to the left petrous temporal bone. Cerebellar tonsils low low lying. The patient is not safe for lumbar puncture.  Remote infarction frontal lobes, posterior right frontal -parietal lobe junction and right cerebellum.    CT Angio Head 07/12/2013    1. Ischemic left cerebellar infarct with involvement of the cerebellar vermis. There is secondary effacement of the 4th ventricle with basilar system crowding and inferior displacement of the cerebellar tonsils into the foramen magnum. No hydrocephalus at this time. No evidence of intracranial hemorrhage. 2. Similar appearance of remote ischemic infarcts involving the bilateral frontal lobes, right frontoparietal region, and right cerebellar hemisphere. 3. Somewhat limited CTA without evidence of high-grade stenosis, occlusion, or intracranial aneurysm.   MRI of the brain  Not yet done  MRA of the brain  See CT angio head  2D Echocardiogram  The estimated ejection fraction was in the range of 55% to 60%. Aortic valve: A mechanical prosthesis was present.  Mitral valve: A mechanical prosthesis was present. Oscillating density in LV cavity most likely residual MV apparatus (unchanged comared to previous study). Ascending aorta appears to be severely dilated; suggest CTA to further assess.   Carotid Doppler   1-39% ICA stenosis. Vertebral artery flow is antegrade.   Therapy Recommendations CIR  Physical Exam General: The patient is alert and cooperative at the time of the examination. Skin: No significant peripheral edema  is noted.  Neurologic Exam Mental status: The patient is oriented x 3. Cranial nerves: Facial symmetry is present. Speech is slightly dysarthric. Extraocular movements are full but with saccadic dysmetria on left gaze. Visual fields are full. Motor: The patient has good strength in all 4 extremities. Sensory examination: Soft touch sensation on the face, arms, and legs. Coordination: The patient has good finger-nose-finger on the right, severe dysmetria on the left. The patient has dysmetria with heel-to-shin, normal on the right. Gait and station: The patient gait was not tested. Reflexes: Deep tendon reflexes are symmetric.  ASSESSMENT [name] is a 57 y.o. male presenting with sudden onset nausea, vomiting, ataxia after giving plasma. Stroke not recognized on arrival. Imaging confirms a left cerebellar infarct. The patient has  significant cerebellar edema with effacement of the fourth ventricle. Today with mild hemorrhagic transformation (asymptomatic). Multiple old  bihemispheric strokes were seen. 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 Current infarct felt to be embolic secondary to mechanical heart valve with subtherapeutic INR. On warfarin prior to admission; INR subtherapeutic on admission. Now on warfarin and heparin for secondary stroke prevention. Patient with resultant ataxia, dizziness. He is awake and talkative.   Aortic valve replacement, mechanical  Mitral valve replacement  Long-term anticoagulation therapy due to mechanical heart vavle  Cigarette smoker  etoh use  Marijuana use  Hospital day # 6  TREATMENT/PLAN  Patient medically ready for transfer to CIR today  Coumadin with IV heparin bridge  Start HCTZ 12.5 mg daily today for HTN  F/u MRI.  F/u lipids  Janalyn Harder, [place] Pager: [phone] 07/16/2013 7:57 AM  I have personally examined this patient, reviewed pertinent data, and about plan of care and agree with the  above Delia Heady, MD

## 2013-07-16 NOTE — Progress Notes (Signed)
Physical Therapy Treatment Patient Details Name: Jeff [name] MRN: [id number] DOB: 1956-07-31 Today's Date: 07/16/2013 Time: 4098-1191 PT Time Calculation (min): 24 min  PT Assessment / Plan / Recommendation  History of Present Illness 57 y.o. male past medical history significant for history of aortic /mitral valve replacement -mechanical valve who presents with nausea and vomitting. He went and donated blood, walked back home and sometime after he got home he suddenly developed nausea vomiting-multiple episodes nonbloody. He denies abdominal pain, dysuria diarrhea fevers and no constipation. He states that he had not eaten before going to give the blood and all he had all day with some candy. He denies any sick contacts. He admits to alcohol use -since that he drinks about a quart of beer once a week. he was seen in the ED and urine drug screen was positive for marijuana, or analysis was unremarkable for infection and alcohol level less than 11. His INR was subtherapeutic at 0.96. CT showed left cerebellar CVA   PT Comments   Pt con't to be motivated and desire to participate in PT. Pt remains to have ataxia and significant balance impairment. Pt to con't to benefit from CIR.   Follow Up Recommendations  CIR     Does the patient have the potential to tolerate intense rehabilitation     Barriers to Discharge        Equipment Recommendations  Rolling walker with 5" wheels    Recommendations for Other Services Rehab consult  Frequency Min 4X/week   Progress towards PT Goals Progress towards PT goals: Progressing toward goals  Plan Current plan remains appropriate    Precautions / Restrictions Precautions Precautions: Fall Restrictions Weight Bearing Restrictions: No   Pertinent Vitals/Pain Stable vitals    Mobility  Bed Mobility Bed Mobility: Not assessed Transfers Transfers: Sit to Stand;Stand to Sit;Stand Pivot Transfers Sit to Stand: 4: Min assist;With upper extremity  assist Stand to Sit: 4: Min assist;With upper extremity assist;To chair/3-in-1 Details for Transfer Assistance: pt unsteady requiring WBOS, pt with significant posterior lean Ambulation/Gait Ambulation/Gait Assistance: 3: Mod assist Ambulation Distance (Feet): 120 Feet Assistive device: Rolling walker Ambulation/Gait Assistance Details: max tactile cues at hips for weight-shift and to provide proprioception to appropriate mm groups to assist in sequencing gait pattern Gait Pattern: Step-through pattern;Decreased stride length;Narrow base of support Gait velocity: slow Stairs: No Modified Rankin (Stroke Patients Only) Pre-Morbid Rankin Score: No symptoms Modified Rankin: Moderately severe disability    Exercises General Exercises - Lower Extremity Heel Raises: AROM;Both;10 reps;Standing Mini-Sqauts: AROM;Both;10 reps;Standing   PT Diagnosis:    PT Problem List:   PT Treatment Interventions:     PT Goals (current goals can now be found in the care plan section)    Visit Information  Last PT Received On: 07/16/13 Assistance Needed: +2 History of Present Illness: 57 y.o. male past medical history significant for history of aortic /mitral valve replacement -mechanical valve who presents with nausea and vomitting. He went and donated blood, walked back home and sometime after he got home he suddenly developed nausea vomiting-multiple episodes nonbloody. He denies abdominal pain, dysuria diarrhea fevers and no constipation. He states that he had not eaten before going to give the blood and all he had all day with some candy. He denies any sick contacts. He admits to alcohol use -since that he drinks about a quart of beer once a week. he was seen in the ED and urine drug screen was positive for marijuana, or analysis was unremarkable for  infection and alcohol level less than 11. His INR was subtherapeutic at 0.96. CT showed left cerebellar CVA    Subjective Data      Cognition   Cognition Arousal/Alertness: Awake/alert Behavior During Therapy: WFL for tasks assessed/performed Overall Cognitive Status: Impaired/Different from baseline Area of Impairment: Awareness Safety/Judgement: Decreased awareness of deficits Awareness: Emergent Problem Solving: Slow processing;Difficulty sequencing;Requires verbal cues;Requires tactile cues General Comments: decreased spatial awareness and proprioception deficits    Balance  Static Standing Balance Static Standing - Balance Support: Bilateral upper extremity supported Static Standing - Level of Assistance: 2: Max assist Static Standing - Comment/# of Minutes: 10 min - worked on trying to find midline and maintain. pt with signficant posterior lean with difficulty self-correcting  End of Session PT - End of Session Equipment Utilized During Treatment: Gait belt Activity Tolerance: Patient tolerated treatment well Patient left: in chair;with call bell/phone within reach Nurse Communication: Mobility status   GP     Marcene Brawn 07/16/2013, 4:43 PM  Lewis Shock, PT, DPT Pager #: [phone] Office #: [phone]

## 2013-07-16 NOTE — Progress Notes (Signed)
I discussed with Dr. Manson Passey with Dr. Pearlean Brownie as well as Dr. Butler Denmark. We will plan admission to inpt rehab today. Pt is in agreement. [phone]

## 2013-07-16 NOTE — Progress Notes (Signed)
ANTICOAGULATION CONSULT NOTE Pharmacy Consult for Heparin Indication: mechanical MVR and AVR  No Known Allergies  Patient Measurements: Height: 6' (182.9 cm) Weight: 175 lb 8 oz (79.606 kg) IBW/kg (Calculated) : 77.6  Vital Signs: Temp: 98.2 F (36.8 C) (12/03 1410) Temp src: Oral (12/03 1410) BP: 160/112 mmHg (12/03 1410) Pulse Rate: 93 (12/03 1410)  Labs:  Recent Labs  07/14/13 1020  07/15/13 0327 07/15/13 1225 07/16/13 0311 07/16/13 1255  HGB 13.9  --  13.1  --  12.9*  --   HCT 40.1  --  37.1*  --  37.2*  --   PLT 266  --  250  --  289  --   LABPROT  --   --   --  16.9* 16.3*  --   INR  --   --   --  1.41 1.34  --   HEPARINUNFRC 1.10*  < > 0.64 0.38 0.15* 0.16*  < > = values in this interval not displayed.  Estimated Creatinine Clearance: 77.3 ml/min (by C-G formula based on Cr of 1.13).  Assessment: 57 y.o. male with mechanical AVR/MVR, new CVA, for anticoagulation.  NO bleeding or changes in neurological status per RN.  Prior Coumadin regimens were 10-15 mg daily.  Goal of Therapy:  Heparin level 0.3-0.5 units/ml INR 2.5-3.5 Monitor platelets by anticoagulation protocol: Yes   Plan:  Increase heparin to 1150 units/hr Check heparin level in 8 hours.   Thank you. Okey Regal, PharmD [phone]   07/16/2013,2:28 PM

## 2013-07-17 ENCOUNTER — Inpatient Hospital Stay (HOSPITAL_COMMUNITY): Payer: Medicaid Other | Admitting: Physical Therapy

## 2013-07-17 ENCOUNTER — Inpatient Hospital Stay (HOSPITAL_COMMUNITY): Payer: Self-pay

## 2013-07-17 ENCOUNTER — Inpatient Hospital Stay (HOSPITAL_COMMUNITY): Payer: Medicaid Other

## 2013-07-17 DIAGNOSIS — I6330 Cerebral infarction due to thrombosis of unspecified cerebral artery: Secondary | ICD-10-CM

## 2013-07-17 DIAGNOSIS — I69993 Ataxia following unspecified cerebrovascular disease: Secondary | ICD-10-CM

## 2013-07-17 LAB — CBC WITH DIFFERENTIAL/PLATELET
Basophils Relative: 0 % (ref 0–1)
Eosinophils Absolute: 0.5 10*3/uL (ref 0.0–0.7)
Eosinophils Relative: 9 % — ABNORMAL HIGH (ref 0–5)
HCT: 36.8 % — ABNORMAL LOW (ref 39.0–52.0)
Lymphs Abs: 1.5 10*3/uL (ref 0.7–4.0)
MCH: 31.9 pg (ref 26.0–34.0)
MCHC: 34.5 g/dL (ref 30.0–36.0)
Monocytes Absolute: 0.6 10*3/uL (ref 0.1–1.0)
Monocytes Relative: 10 % (ref 3–12)
Neutrophils Relative %: 55 % (ref 43–77)
Platelets: 308 10*3/uL (ref 150–400)
RBC: 3.98 MIL/uL — ABNORMAL LOW (ref 4.22–5.81)

## 2013-07-17 LAB — COMPREHENSIVE METABOLIC PANEL
Albumin: 3 g/dL — ABNORMAL LOW (ref 3.5–5.2)
Alkaline Phosphatase: 73 U/L (ref 39–117)
BUN: 10 mg/dL (ref 6–23)
CO2: 26 mEq/L (ref 19–32)
Creatinine, Ser: 1.03 mg/dL (ref 0.50–1.35)
Potassium: 3.9 mEq/L (ref 3.5–5.1)
Sodium: 138 mEq/L (ref 135–145)
Total Protein: 5.9 g/dL — ABNORMAL LOW (ref 6.0–8.3)

## 2013-07-17 LAB — PROTIME-INR
INR: 1.58 — ABNORMAL HIGH (ref 0.00–1.49)
Prothrombin Time: 18.4 seconds — ABNORMAL HIGH (ref 11.6–15.2)

## 2013-07-17 MED ORDER — WARFARIN SODIUM 7.5 MG PO TABS
15.0000 mg | ORAL_TABLET | Freq: Once | ORAL | Status: AC
  Administered 2013-07-17: 15 mg via ORAL
  Filled 2013-07-17: qty 2

## 2013-07-17 NOTE — Evaluation (Signed)
Physical Therapy Assessment and Plan  Patient Details  Name: Jeff [name] MRN: [id number] Date of Birth: 04/23/1956  PT Diagnosis: Ataxia, Ataxic gait and Difficulty walking Rehab Potential: Good ELOS: 14 days   Today's Date: 07/17/2013 Time: 0730-0825 Time Calculation (min): 55 min  Problem List:  Patient Active Problem List   Diagnosis Date Noted  . CVA (cerebral infarction) 07/16/2013  . Cerebellar stroke, acute 07/13/2013  . Ataxia 07/12/2013  . Vomiting 07/10/2013  . Nausea with vomiting 07/10/2013  . Subtherapeutic international normalized ratio (INR) 07/10/2013  . AKI (acute kidney injury) 07/10/2013  . Volume depletion 07/10/2013  . Nausea & vomiting 07/10/2013  . Cellulitis of left upper arm and forearm 04/04/2012  . History of CVA (cerebrovascular accident)   . Hx of aortic valve replacement, mechanical   . H/O mitral valve replacement with mechanical valve   . HX: long term anticoagulant use     Past Medical History:  Past Medical History  Diagnosis Date  . History of CVA (cerebrovascular accident)     02/2011  . Hx of aortic valve replacement, mechanical   . H/O mitral valve replacement with mechanical valve   . HX: long term anticoagulant use    Past Surgical History:  Past Surgical History  Procedure Laterality Date  . Cardiac valve replacement      Assessment & Plan Clinical Impression: Patient is a 57 y.o. year old male with recent admission to the hospital on 07/10/2013 with nausea vomiting and ataxia. Patient was independent prior to admission he does not drive. Cranial CT scan showed a large left acute cerebellar ischemic infarct with mass effect with effacement of the fourth ventricle as well as mild downward compression of the cerebellar tonsils. No signs of hydrocephalus. 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 CT angiogram of the head with no evidence of stenosis or occlusion. INR on admission of 0.96. By report patient has been  compliant with his Coumadin. Patient did not receive TPA. Urine drug screen was positive for marijuana. Carotid Dopplers with no ICA stenosis. Echocardiogram with ejection fraction of 60% unchanged from prior studies. Patient remains on chronic Coumadin therapy as well as the addition of intravenous heparin until INR therapeutic. Patient transferred to CIR on 07/16/2013 .   Patient currently requires mod with mobility secondary to ataxia and decreased coordination and decreased standing balance, decreased postural control and decreased balance strategies.  Prior to hospitalization, patient was independent  with mobility and lived with Alone in a Apartment home.  Home access is 1Stairs to enter.  Patient will benefit from skilled PT intervention to maximize safe functional mobility, minimize fall risk and decrease caregiver burden for planned discharge home with intermittent assist.  Anticipate patient will benefit from follow up [facility] at discharge.  PT - End of Session Activity Tolerance: Tolerates 30+ min activity with multiple rests Endurance Deficit: Yes PT Assessment Rehab Potential: Good Barriers to Discharge: Decreased caregiver support PT Patient demonstrates impairments in the following area(s): Balance;Endurance;Motor PT Transfers Functional Problem(s): Bed Mobility;Bed to Chair;Car;Furniture;Floor PT Locomotion Functional Problem(s): Ambulation;Stairs;Wheelchair Mobility PT Plan PT Intensity: Minimum of 1-2 x/day ,45 to 90 minutes PT Frequency: 5 out of 7 days PT Duration Estimated Length of Stay: 14 days PT Treatment/Interventions: Ambulation/gait training;Discharge planning;Functional mobility training;Therapeutic Activities;Therapeutic Exercise;Wheelchair propulsion/positioning;Neuromuscular re-education;Balance/vestibular training;DME/adaptive equipment instruction;Splinting/orthotics;UE/LE Strength taining/ROM;UE/LE Media planner  education;Functional electrical stimulation;Community reintegration PT Transfers Anticipated Outcome(s): mod I PT Locomotion Anticipated Outcome(s): supervision PT Recommendation Follow Up Recommendations: Home health PT;Outpatient PT Patient destination: Home  Equipment Recommended: To be determined  Skilled Therapeutic Intervention Standing balance training with alternating tap ups with min-mod A for balance, cues and manual facilitaiton for grading mm activity on L.  Mini squats and sit to stand without UE support focus on grading LE movements and motor control with min-mod A.  Gait with RW with min A, improved balance and pt states he feels more secure with RW vs without device.  PT Evaluation Precautions/Restrictions Precautions Precautions: Fall Restrictions Weight Bearing Restrictions: No Pain Pain Assessment Pain Assessment: No/denies pain Home Living/Prior Functioning Home Living Living Arrangements: Alone Available Help at Discharge: Friend(s);Family;Available PRN/intermittently Type of Home: Apartment Home Access: Stairs to enter Entrance Stairs-Number of Steps: 1 Entrance Stairs-Rails: None Home Layout: One level  Lives With: Alone Prior Function Level of Independence: Independent with gait;Independent with basic ADLs;Independent with transfers  Able to Take Stairs?: Yes Driving: No Vocation: Part time employment Vocation Requirements: climbing, lift, push, pull, maintain dynamic standing balance Comments: Pt does not drive.  Pt ambulates frequently long distances. does not work, sister takes pt to grocery store  Cognition Overall Cognitive Status: Within Functional Limits for tasks assessed Safety/Judgment: Appears intact Sensation Sensation Light Touch: Appears Intact Stereognosis: Appears Intact Hot/Cold: Appears Intact Proprioception: Appears Intact Proprioception Impaired Details: Impaired LUE Coordination Gross Motor Movements are Fluid and  Coordinated: No Fine Motor Movements are Fluid and Coordinated: No Coordination and Movement Description: L-UE ataxia Motor  Motor Motor: Ataxia;Abnormal postural alignment and control  Mobility Bed Mobility Supine to Sit: 4: Min assist Supine to Sit Details (indicate cue type and reason): min A for trunk control, increased time Transfers Sit to Stand: 4: Min assist Sit to Stand Details (indicate cue type and reason): cues for UE placement, steadying assist for balance Stand to Sit: 4: Min assist Locomotion  Ambulation Ambulation/Gait Assistance: 3: Mod assist Ambulation Distance (Feet): 20 Feet Assistive device: None Ambulation/Gait Assistance Details: manual facilitation for balance and wt shifting. pt with wide BOS, decreased trunk rotation and arm swing Stairs / Additional Locomotion Stairs: Yes Stairs Assistance: 4: Min assist Stair Management Technique: Two rails Number of Stairs: 3 Wheelchair Mobility Wheelchair Mobility: Yes Wheelchair Assistance: 4: Min Education officer, museum: Both upper extremities Distance: 25  Trunk/Postural Assessment  Cervical Assessment Cervical Assessment: Within Film/video editor Assessment: Within Functional Limits Lumbar Assessment Lumbar Assessment: Within Functional Limits Postural Control Postural Control: Deficits on evaluation Righting Reactions: posterior instability during dynamic standing  Balance Dynamic Sitting Balance Dynamic Sitting - Balance Support: Feet supported;No upper extremity supported Dynamic Sitting - Level of Assistance: 4: Min Oncologist Standing - Balance Support: During functional activity Static Standing - Level of Assistance: 3: Mod assist Extremity Assessment  RUE Assessment RUE Assessment: Within Functional Limits LUE Assessment LUE Assessment: Within Functional Limits RLE Assessment RLE Assessment: Within Functional Limits LLE  Assessment LLE Assessment: Within Functional Limits  FIM:  FIM - Bed/Chair Transfer Bed/Chair Transfer: 3: Bed > Chair or W/C: Mod A (lift or lower assist);3: Chair or W/C > Bed: Mod A (lift or lower assist) FIM - Locomotion: Wheelchair Distance: 25 Locomotion: Wheelchair: 1: Travels less than 50 ft with minimal assistance (Pt.>75%) FIM - Locomotion: Ambulation Ambulation/Gait Assistance: 3: Mod assist Locomotion: Ambulation: 1: Travels less than 50 ft with moderate assistance (Pt: 50 - 74%) FIM - Locomotion: Stairs Locomotion: Stairs: 1: Up and Down < 4 stairs with minimal assistance (Pt.>75%)   Refer to Care Plan for Long Term Goals  Recommendations  for other services: None  Discharge Criteria: Patient will be discharged from PT if patient refuses treatment 3 consecutive times without medical reason, if treatment goals not met, if there is a change in medical status, if patient makes no progress towards goals or if patient is discharged from hospital.  The above assessment, treatment plan, treatment alternatives and goals were discussed and mutually agreed upon: by patient  Tulio Facundo 07/17/2013, 12:00 PM

## 2013-07-17 NOTE — Progress Notes (Signed)
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 Presented 07/10/2013 with nausea vomiting and ataxia. Patient was independent prior to admission he does not drive. Cranial CT scan showed a large left acute cerebellar ischemic infarct with mass effect with effacement of the fourth ventricle as well as mild downward compression of the cerebellar tonsils. No signs of hydrocephalus. 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 CT angiogram of the head with no evidence of stenosis or occlusion. INR on admission of 0.96  Subjective/Complaints: Left side not working well. No worsening of problems as per patient.  Review of Systems - Negative except Poor coordination of left side   Objective: Vital Signs: Blood pressure 129/88, pulse 81, temperature 98.7 F (37.1 C), temperature source Oral, resp. rate 18, height 6' (1.829 m), weight 79.606 kg (175 lb 8 oz), SpO2 94.00%. Ct Head Wo Contrast  07/16/2013   CLINICAL DATA:  Stroke.  Received TPA.  EXAM: CT HEAD WITHOUT CONTRAST  TECHNIQUE: Contiguous axial images were obtained from the base of the skull through the vertex without intravenous contrast.  COMPARISON:  CT 07/14/2013  FINDINGS: Large area of acute infarct left cerebellum with edema and mass effect on the 4th ventricle. Mild amount of hemorrhage within the infarct is unchanged.  The 4th ventricle is displaced to the right. Third and lateral ventricles show mild progressive dilatation compatible with obstructive hydrocephalus. This shows mild progression from 07/14/2013 and has definitely progressed from the admitting CT of 07/12/2013.  Chronic infarcts in the frontal lobes bilaterally and in the right parietal lobe. Chronic right cerebellar infarct.  IMPRESSION: Hemorrhagic infarct left cerebellum is stable.  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   Electronically Signed   By: Marlan Palau M.D.   On: 07/16/2013 09:18   Results for orders placed during the hospital encounter of 07/16/13 (from the past 72 hour(s))  HEPARIN LEVEL (UNFRACTIONATED)     Status: None   Collection Time    07/16/13  9:45 PM      Result Value Range   Heparin Unfractionated 0.54  0.30 - 0.70 IU/mL   Comment:            IF HEPARIN RESULTS ARE BELOW     EXPECTED VALUES, AND PATIENT     DOSAGE HAS BEEN CONFIRMED,     SUGGEST FOLLOW UP TESTING     OF ANTITHROMBIN III LEVELS.  HEPARIN LEVEL (UNFRACTIONATED)     Status: None   Collection Time    07/17/13  5:15 AM      Result Value Range   Heparin Unfractionated 0.52  0.30 - 0.70 IU/mL   Comment:            IF HEPARIN RESULTS ARE BELOW     EXPECTED VALUES, AND PATIENT     DOSAGE HAS BEEN CONFIRMED,     SUGGEST FOLLOW UP TESTING     OF ANTITHROMBIN III LEVELS.  CBC WITH DIFFERENTIAL     Status: Abnormal   Collection Time    07/17/13  5:15 AM      Result Value Range   WBC 5.8  4.0 - 10.5 K/uL   RBC 3.98 (*) 4.22 - 5.81 MIL/uL   Hemoglobin 12.7 (*) 13.0 - 17.0 g/dL   HCT 16.1 (*) 09.6 - 04.5 %   MCV 92.5  78.0 - 100.0 fL   MCH 31.9  26.0 - 34.0 pg  MCHC 34.5  30.0 - 36.0 g/dL   RDW 16.1  09.6 - 04.5 %   Platelets 308  150 - 400 K/uL   Neutrophils Relative % 55  43 - 77 %   Neutro Abs 3.2  1.7 - 7.7 K/uL   Lymphocytes Relative 26  12 - 46 %   Lymphs Abs 1.5  0.7 - 4.0 K/uL   Monocytes Relative 10  3 - 12 %   Monocytes Absolute 0.6  0.1 - 1.0 K/uL   Eosinophils Relative 9 (*) 0 - 5 %   Eosinophils Absolute 0.5  0.0 - 0.7 K/uL   Basophils Relative 0  0 - 1 %   Basophils Absolute 0.0  0.0 - 0.1 K/uL  COMPREHENSIVE METABOLIC PANEL     Status: Abnormal   Collection Time    07/17/13  5:15 AM      Result Value Range   Sodium 138  135 - 145 mEq/L   Potassium 3.9  3.5 - 5.1 mEq/L   Chloride 104  96 - 112 mEq/L   CO2 26  19 - 32 mEq/L   Glucose, Bld 99  70 - 99 mg/dL   BUN 10  6 - 23 mg/dL   Creatinine, Ser 4.09  0.50 - 1.35 mg/dL    Calcium 8.8  8.4 - 81.1 mg/dL   Total Protein 5.9 (*) 6.0 - 8.3 g/dL   Albumin 3.0 (*) 3.5 - 5.2 g/dL   AST 94 (*) 0 - 37 U/L   ALT 126 (*) 0 - 53 U/L   Alkaline Phosphatase 73  39 - 117 U/L   Total Bilirubin 0.2 (*) 0.3 - 1.2 mg/dL   GFR calc non Af Amer 78 (*) >90 mL/min   GFR calc Af Amer 90 (*) >90 mL/min   Comment: (NOTE)     The eGFR has been calculated using the CKD EPI equation.     This calculation has not been validated in all clinical situations.     eGFR's persistently <90 mL/min signify possible Chronic Kidney     Disease.  PROTIME-INR     Status: Abnormal   Collection Time    07/17/13  5:15 AM      Result Value Range   Prothrombin Time 18.4 (*) 11.6 - 15.2 seconds   INR 1.58 (*) 0.00 - 1.49     HEENT: normal Cardio: RRR and No murmurs Resp: CTA B/L and Unlabored GI: BS positive and Nondistended Extremity:  Pulses positive and No Edema Skin:   Intact Neuro: Alert/Oriented, Cranial Nerve II-XII normal, Normal Sensory, Abnormal Motor /5 in the left deltoid, bicep, tricep, grip, hip flexor, knee extensor ankle dorsiflexor plantar flexor5/5 on the right side. and Abnormal FMC Ataxic/ dec FMC Musc/Skel:  Normal General no acute distress   Assessment/Plan: 1. Functional deficits secondary to  left cerebellar embolic infarct, hemorrhagic conversion with left hemi ataxia  which require 3+ hours per day of interdisciplinary therapy in a comprehensive inpatient rehab setting. Physiatrist is providing close team supervision and 24 hour management of active medical problems listed below. Physiatrist and rehab team continue to assess barriers to discharge/monitor patient progress toward functional and medical goals. FIM:             FIM - Bed/Chair Transfer Bed/Chair Transfer: 3: Bed > Chair or W/C: Mod A (lift or lower assist);3: Chair or W/C > Bed: Mod A (lift or lower assist)  FIM - Locomotion: Wheelchair Locomotion: Wheelchair: 1: Travels less than 50 ft  with  minimal assistance (Pt.>75%) FIM - Locomotion: Ambulation Locomotion: Ambulation: 1: Travels less than 50 ft with moderate assistance (Pt: 50 - 74%)                 Medical Problem List and Plan:  1. Embolic left cerebellar infarct infarct occurred when INR was somewhat therapeutic. Neurology and neurosurgery aware of hemorrhagic conversion. No progressive neurologic symptoms to warrant discontinuation of anticoagulation at this time 2. DVT Prophylaxis/Anticoagulation: Chronic Coumadin therapy with history of aortic and mitral valve replacement. Continue heparin until INR greater than 2.5  3. Pain Management: Tylenol as needed  4. Neuropsych: This patient is capable of making decisions on his own behalf.  5. History of alcohol, marijuana and tobacco abuse. Provide full counseling   LOS (Days) 1 A FACE TO FACE EVALUATION WAS PERFORMED  Freeda Spivey E 07/17/2013, 8:59 AM

## 2013-07-17 NOTE — Evaluation (Signed)
Occupational Therapy Assessment and Plan  Patient Details  Name: Jeff [name] MRN: [id number] Date of Birth: 18-Apr-1956  OT Diagnosis: ataxia Rehab Potential: Rehab Potential: Good ELOS: 10-14 days   Today's Date: 07/17/2013 Time: 5284-1324 Time Calculation (min): 60 min  Problem List:  Patient Active Problem List   Diagnosis Date Noted  . CVA (cerebral infarction) 07/16/2013  . Cerebellar stroke, acute 07/13/2013  . Ataxia 07/12/2013  . Vomiting 07/10/2013  . Nausea with vomiting 07/10/2013  . Subtherapeutic international normalized ratio (INR) 07/10/2013  . AKI (acute kidney injury) 07/10/2013  . Volume depletion 07/10/2013  . Nausea & vomiting 07/10/2013  . Cellulitis of left upper arm and forearm 04/04/2012  . History of CVA (cerebrovascular accident)   . Hx of aortic valve replacement, mechanical   . H/O mitral valve replacement with mechanical valve   . HX: long term anticoagulant use     Past Medical History:  Past Medical History  Diagnosis Date  . History of CVA (cerebrovascular accident)     02/2011  . Hx of aortic valve replacement, mechanical   . H/O mitral valve replacement with mechanical valve   . HX: long term anticoagulant use    Past Surgical History:  Past Surgical History  Procedure Laterality Date  . Cardiac valve replacement      Assessment & Plan Clinical Impression: Patient is a 57 y.o. year old right handed male with history of aortic mitral valve replacement on chronic Coumadin therapy. Hypertension as well as CVA July of 2012.   Presented 07/10/2013 with nausea vomiting and ataxia. Patient was independent prior to admission he does not drive. Cranial CT scan showed a large left acute cerebellar ischemic infarct with mass effect with effacement of the fourth ventricle as well as mild downward compression of the cerebellar tonsils. No signs of hydrocephalus. INR on admission of 0.96. By report patient has been compliant with his Coumadin.  Patient did not receive TPA. Carotid Dopplers with no ICA stenosis. Patient remains on chronic Coumadin therapy as well as the addition of intravenous heparin until INR therapeutic. He is tolerating a regular consistency diet.  Coumadin with IV heaprin bridge. Start HCTZ 12.5 mg daily today for HTN.   Total: 2 NIH  Patient transferred to CIR on 07/16/2013 .    Patient currently requires mod assist with basic self-care skills secondary to ataxia.  Prior to hospitalization, patient could complete BADL/iADL independently.  Patient will benefit from skilled intervention to increase independence with basic self-care skills and increase level of independence with iADL prior to discharge home independently.  Anticipate patient will require intermittent supervision and follow up home health.  OT - End of Session Activity Tolerance: Tolerates 30+ min activity with multiple rests Endurance Deficit: Yes OT Assessment Rehab Potential: Good Barriers to Discharge: Decreased caregiver support Barriers to Discharge Comments: relying on his neighbor "Burna Mortimer" for intermittent supervision/assist OT Patient demonstrates impairments in the following area(s): Balance;Motor;Safety OT Basic ADL's Functional Problem(s): Bathing;Dressing;Toileting OT Advanced ADL's Functional Problem(s): Simple Meal Preparation;Laundry;Light Housekeeping OT Transfers Functional Problem(s): Toilet;Tub/Shower OT Plan OT Intensity: Minimum of 1-2 x/day, 45 to 90 minutes OT Frequency: 5 out of 7 days OT Duration/Estimated Length of Stay: 10-14 days OT Treatment/Interventions: Balance/vestibular training;UE/LE Coordination activities;Self Care/advanced ADL retraining;Neuromuscular re-education;Functional mobility training;Therapeutic Activities;Therapeutic Exercise;Patient/family education;DME/adaptive equipment instruction;Discharge planning OT Self Feeding Anticipated Outcome(s): Independent OT Basic Self-Care Anticipated Outcome(s):  Mod I OT Toileting Anticipated Outcome(s): Mod I OT Bathroom Transfers Anticipated Outcome(s): Mod I OT Recommendation Patient destination: Home Follow  Up Recommendations: Home health OT Equipment Recommended: Tub/shower bench;3 in 1 bedside comode  Skilled Therapeutic Intervention 1:1 OT initial evaluation completed with intervention provided to emphasize safety awareness, transfers, improved coordination of left upper extremity during ADL, and dynamic standing balance.   Patient completed toilet transfer with mod assist due to impaired dynamic standing balance but completed toileting with min assist for thoroughness and management of IV pole.   Patient verbalized awareness of balance impairment and decreased coordination but was unable to anticipate impact of deficits during initial ADL session.  OT Evaluation Precautions/Restrictions  Precautions Precautions: Fall Restrictions Weight Bearing Restrictions: No  General Chart Reviewed: Yes Family/Caregiver Present: Yes  Pain Pain Assessment Pain Assessment: No/denies pain  Home Living/Prior Functioning Home Living Living Arrangements: Alone Available Help at Discharge: Friend(s);Family;Available PRN/intermittently Type of Home: Apartment Home Access: Stairs to enter Entrance Stairs-Number of Steps: 1 Entrance Stairs-Rails: None Home Layout: One level  Lives With: Alone IADL History Homemaking Responsibilities: Yes Meal Prep Responsibility: Primary Laundry Responsibility: Primary Cleaning Responsibility: Primary Bill Paying/Finance Responsibility: Primary Shopping Responsibility: Primary Child Care Responsibility: No Homemaking Comments: sister drives for shopping Current License: No Education: 10 th grade Occupation: Part time employment Type of Occupation: Holiday representative, Development worker, community, painting Leisure and Hobbies: party (friends) at his apartment Prior Function Level of Independence: Independent with gait;Independent  with basic ADLs;Independent with transfers  Able to Take Stairs?: Yes Driving: No Vocation: Part time employment Vocation Requirements: climbing, lift, push, pull, maintain dynamic standing balance Comments: Pt does not drive.  Pt ambulates frequently long distances. does not work, sister takes pt to grocery store  ADL See FIM  Vision/Perception  Vision - History Baseline Vision: Wears glasses only for reading Patient Visual Report: No change from baseline Vision - Assessment Eye Alignment: Within Functional Limits Perception Perception: Within Functional Limits Praxis Praxis: Intact   Cognition Overall Cognitive Status: Within Functional Limits for tasks assessed Orientation Level: Oriented X4 Attention: Selective Selective Attention: Appears intact Memory: Appears intact Awareness: Appears intact Problem Solving: Impaired Problem Solving Impairment: Functional complex;Verbal complex Safety/Judgment: Appears intact  Sensation Sensation Light Touch: Appears Intact Stereognosis: Appears Intact Hot/Cold: Appears Intact Proprioception: Appears Intact Proprioception Impaired Details: Impaired LUE Coordination Gross Motor Movements are Fluid and Coordinated: No Fine Motor Movements are Fluid and Coordinated: No Coordination and Movement Description: L-UE ataxia  Motor  Motor Motor: Ataxia;Abnormal postural alignment and control  Mobility  Bed Mobility Supine to Sit: 4: Min assist Supine to Sit Details (indicate cue type and reason): min A for trunk control, increased time Transfers Transfers: Sit to Stand;Stand to Sit Sit to Stand: 4: Min assist Sit to Stand Details (indicate cue type and reason): cues for UE placement, steadying assist for balance Stand to Sit: 4: Min assist   Trunk/Postural Assessment  Cervical Assessment Cervical Assessment: Within Functional Limits Thoracic Assessment Thoracic Assessment: Within Functional Limits Lumbar Assessment Lumbar  Assessment: Within Functional Limits Postural Control Postural Control: Deficits on evaluation Righting Reactions: posterior instability during dynamic standing   Balance Static Sitting Balance Static Sitting - Balance Support: Feet supported Static Sitting - Level of Assistance: 6: Modified independent (Device/Increase time) Dynamic Sitting Balance Dynamic Sitting - Balance Support: Feet supported;No upper extremity supported Dynamic Sitting - Level of Assistance: 4: Min assist Dynamic Sitting Balance - Compensations: falls into right lateral lean while bending forward during seated bathing/dressing of lower body Static Standing Balance Static Standing - Balance Support: During functional activity Static Standing - Level of Assistance: 3: Mod assist  Extremity/Trunk  Assessment RUE Assessment RUE Assessment: Within Functional Limits LUE Assessment LUE Assessment: Within Functional Limits  FIM:  FIM - Eating Eating Activity: 7: Complete independence:no helper FIM - Grooming Grooming Steps: Wash, rinse, dry face;Wash, rinse, dry hands Grooming: 3: Patient completes 2 of 4 or 3 of 5 steps FIM - Bathing Bathing Steps Patient Completed: Chest;Right Arm;Left Arm;Abdomen;Front perineal area;Buttocks;Right upper leg;Left upper leg;Right lower leg (including foot);Left lower leg (including foot) Bathing: 4: Steadying assist FIM - Upper Body Dressing/Undressing Upper body dressing/undressing: 0: Wears gown/pajamas-no public clothing FIM - Lower Body Dressing/Undressing Lower body dressing/undressing steps patient completed: Thread/unthread right underwear leg;Thread/unthread left underwear leg;Pull underwear up/down;Thread/unthread right pants leg;Thread/unthread left pants leg;Pull pants up/down;Fasten/unfasten pants;Don/Doff right sock;Don/Doff left sock Lower body dressing/undressing: 3: Mod-Patient completed 50-74% of tasks FIM - Toileting Toileting steps completed by patient: Adjust  clothing prior to toileting;Performs perineal hygiene;Adjust clothing after toileting Toileting: 4: Steadying assist FIM - Bed/Chair Transfer Bed/Chair Transfer: 3: Bed > Chair or W/C: Mod A (lift or lower assist);3: Chair or W/C > Bed: Mod A (lift or lower assist) FIM - Diplomatic Services operational officer Devices: Walker;Grab bars Toilet Transfers: 3-To toilet/BSC: Mod A (lift or lower assist);3-From toilet/BSC: Mod A (lift or lower assist)   Refer to Care Plan for Long Term Goals  Recommendations for other services: None  Discharge Criteria: Patient will be discharged from OT if patient refuses treatment 3 consecutive times without medical reason, if treatment goals not met, if there is a change in medical status, if patient makes no progress towards goals or if patient is discharged from hospital.  The above assessment, treatment plan, treatment alternatives and goals were discussed and mutually agreed upon: by patient  [facility] 07/17/2013, 12:21 PM

## 2013-07-17 NOTE — Progress Notes (Signed)
Physical Therapy Note  Patient Details  Name: Jaimes Eckert MRN: [id number] Date of Birth: Aug 16, 1955 Today's Date: 07/17/2013  Time: 1300-1330 30 minutes  1:1 no c/o pain.  Gait training with manual facilitation at hips to assist with wt shifts and grading movement.  Pt still with wide BOS and decreased cadence, min A.  Quadruped alternating UE/LE lifts for core stability and control.  Pt min A for balance when lifting R UE or LE due to L side weakness.  Pt with good activity tolerance during session.   Cindy Fullman 07/17/2013, 1:29 PM

## 2013-07-17 NOTE — Progress Notes (Signed)
Occupational Therapy Session Note  Patient Details  Name: Jeff [name] MRN: [id number] Date of Birth: 1955-10-01  Today's Date: 07/17/2013 Time: 1330-1415 Time Calculation (min): 45 min  Short Term Goals: Week 1:     Skilled Therapeutic Interventions/Progress Updates:   Therapy session focused on NMR to LUE and LLE. Engaged in tall kneeling while weightbearing through LUE and engaging in card matching task. Participated in medium peg board game with LUE to pick up and place pegs. Ataxic movements noted with decreased control when reaching across midline. Cued pt to keep elbow closer to body as he was demonstrating a winging movement during reaching task. Increase in coordination noted after this cue. Participated in dynamic standing task with focus on using BUE to place ball in basket ball hoop. Pt with posterior lean during task requiring min assist for balance. Pt required cues to visually attend to placement of L hand on ball as he was unaware it was sliding during task. Engaged in dynamic reaching with LUE while in sitting. Placed and retrieve horseshoes from basketball hoop by reaching across midline. Completed 3 sets of 8 reps and rest breaks between. Increased ataxia noted when attempting to grab item from across body. At end of session pt ambulated back to room with increased time and min assist. Pt with posterior LOB on one occasion requiring mod assist to correct. Pt left sitting in w/c with all needs in reach.   Therapy Documentation Precautions:  Precautions Precautions: Fall Restrictions Weight Bearing Restrictions: No General: General Chart Reviewed: Yes Family/Caregiver Present: Yes Vital Signs:   Pain: No report of pain during therapy session.   See FIM for current functional status  Therapy/Group: Individual Therapy  [name] 07/17/2013, 2:42 PM

## 2013-07-17 NOTE — Progress Notes (Signed)
Patient information reviewed and entered into eRehab system by Mirca Yale, RN, CRRN, PPS Coordinator.  Information including medical coding and functional independence measure will be reviewed and updated through discharge.    

## 2013-07-17 NOTE — Care Management Note (Signed)
[facility] Individual Statement of Services  Patient Name:  Jeff [name]  Date:  07/17/2013  Welcome to the [facility].  Our goal is to provide you with an individualized program based on your diagnosis and situation, designed to meet your specific needs.  With this comprehensive rehabilitation program, you will be expected to participate in at least 3 hours of rehabilitation therapies Monday-Friday, with modified therapy programming on the weekends.  Your rehabilitation program will include the following services:  Physical Therapy (PT), Occupational Therapy (OT), Speech Therapy (ST), 24 hour per day rehabilitation nursing, Case Management (Social Worker), Rehabilitation Medicine, Nutrition Services and Pharmacy Services  Weekly team conferences will be held on Wednesday to discuss your progress.  Your Social Worker will talk with you frequently to get your input and to update you on team discussions.  Team conferences with you and your family in attendance may also be held.  Expected length of stay: 14 days Overall anticipated outcome: supervision/mod/i level  Depending on your progress and recovery, your program may change. Your Social Worker will coordinate services and will keep you informed of any changes. Your Social Worker's name and contact numbers are listed  below.  The following services may also be recommended but are not provided by the [facility]:   Home Health Rehabiltiation Services  Outpatient Rehabilitation Services    Arrangements will be made to provide these services after discharge if needed.  Arrangements include referral to agencies that provide these services.  Your insurance has been verified to be:  None Your primary doctor is:  [facility]  Pertinent information will be shared with your doctor and your insurance company.  Social Worker:  Dossie Der, SW [phone] or ([phone]  Information discussed with and copy given to patient by: Lucy Chris, 07/17/2013, 10:05 AM

## 2013-07-17 NOTE — Progress Notes (Signed)
Social Work Assessment and Plan Social Work Assessment and Plan  Patient Details  Name: Jeff [name] MRN: [id number] Date of Birth: 27-Dec-1955  Today's Date: 07/17/2013  Problem List:  Patient Active Problem List   Diagnosis Date Noted  . CVA (cerebral infarction) 07/16/2013  . Cerebellar stroke, acute 07/13/2013  . Ataxia 07/12/2013  . Vomiting 07/10/2013  . Nausea with vomiting 07/10/2013  . Subtherapeutic international normalized ratio (INR) 07/10/2013  . AKI (acute kidney injury) 07/10/2013  . Volume depletion 07/10/2013  . Nausea & vomiting 07/10/2013  . Cellulitis of left upper arm and forearm 04/04/2012  . History of CVA (cerebrovascular accident)   . Hx of aortic valve replacement, mechanical   . H/O mitral valve replacement with mechanical valve   . HX: long term anticoagulant use    Past Medical History:  Past Medical History  Diagnosis Date  . History of CVA (cerebrovascular accident)     02/2011  . Hx of aortic valve replacement, mechanical   . H/O mitral valve replacement with mechanical valve   . HX: long term anticoagulant use    Past Surgical History:  Past Surgical History  Procedure Laterality Date  . Cardiac valve replacement     Social History:  reports that he has been smoking Cigarettes.  He has been smoking about 0.05 packs per day. He does not have any smokeless tobacco history on file. He reports that he drinks about 8.4 ounces of alcohol per week. He reports that he uses illicit drugs (Marijuana) about 7 times per week.  Family / Support Systems Marital Status: Widow/Widower Patient Roles: Parent;Other (Comment) (Sibling) Children: Yolanda-daughter Other Supports: Garey Ham  [phone]  [phone] Anticipated Caregiver: Intermittent Ability/Limitations of Caregiver: Trying to come up with a care plan Caregiver Availability: Other (Comment) (All work but trying to come up with a plan) Family Dynamics: Pt reports he is close to  his daughter's and sister.  He does not want to burden them and wants to be as independent as possible before he leaves here.  Social History Preferred language: English Religion: Holiness Cultural Background: No issues Education: High School Read: Yes Write: Yes Employment Status: Unemployed Fish farm manager Issues: Tried to apply for SSD but was denied 2012 plan to have him and sister try again with this diagnosis Guardian/Conservator: None-according to MD pt is capable of making his own decisions.     Abuse/Neglect Physical Abuse: Denies Verbal Abuse: Denies Sexual Abuse: Denies Exploitation of patient/patient's resources: Denies Self-Neglect: Denies  Emotional Status Pt's affect, behavior adn adjustment status: Pt is motivated to do well here and has in the past.  So he is hopeful he will do well here and be independent again.  He relies upon himself and does not like asking for help.  He plans to work hard and regain as much movement as possible. Recent Psychosocial Issues: Other health issues-had old CVA 2012 which he almost fully recovered Pyschiatric History: No history deferred depression screen due to pt felt it was not needed.  Will monitor his coping and have Neuro-psych intervene if needed.  Will also provide support while here. Substance Abuse History: Tobacco/Marijuana/ ETOH he reports he drinks beer and realizes he needs to stop smoking if he can.  He is aware of the health risks and plans to try to quit.  Will offer him resources to assist with this.  Patient / Family Perceptions, Expectations & Goals Pt/Family understanding of illness & functional limitations: Pt is able to explain his stroke  and deficits.  He is willing to work hard and regain as much function as he can before discharge from here.  He is getting some movement back and is encouraged by this. Premorbid pt/family roles/activities: Father, Emelia Loron, Brother, Georganna Skeans, Friend, etc Anticipated  changes in roles/activities/participation: resume Pt/family expectations/goals: Pt states: " I want to be able to do for myself before I leave here."  He does not want to rely upon his fmaily to assist him.  Community Resources [employer]: Other (Comment) [facility] followed by) Premorbid Home Care/DME Agencies: None Transportation available at discharge: Sister and daughter's Resource referrals recommended: Support group (specify) (CVA SUpport group)  Discharge Planning Living Arrangements: Alone Support Systems: Children;Other relatives;Friends/neighbors Type of Residence: Private residence Insurance Resources: Customer service manager Resources: Other (Comment) ([facility], housing assistance, heat assistance) Financial Screen Referred: Yes Living Expenses: Other (Comment) (Section 8 pays rent) Money Management: Patient Does the patient have any problems obtaining your medications?: Yes (Describe) (No insurance but gets through clinic) Home Management: Self Patient/Family Preliminary Plans: Return to his apartment or go to his daughter's home for a short time.  He plans to retrun to his apartment.  Will try to confirm with his daughter if an option going to one of their homes.  Will await his progress Social Work Anticipated Follow Up Needs: HH/OP;Support Group  Clinical Impression Very soft spoken gentleman who is motivated to work and regain his independence.  He wants to return to his own apartment and does not want to burden his family with his care. Will talk with family and have begin SSD and Medicaid process to see if eligible this time.  His sister to assist with this.  Await team's evaluations  Lucy Chris 07/17/2013, 10:37 AM

## 2013-07-17 NOTE — IPOC Note (Signed)
Overall Plan of Care [facility]) Patient Details Name: Jeff [name] MRN: [id number] DOB: [birth year]  Admitting Diagnosis: CVA  Hospital Problems: Active Problems:   CVA (cerebral infarction)     Functional Problem List: Nursing Bladder;Bowel;Endurance;Medication Management;Pain;Safety  PT Balance;Endurance;Motor  OT Balance;Motor;Safety  SLP    TR         Basic ADL's: OT Bathing;Dressing;Toileting     Advanced  ADL's: OT Simple Meal Preparation;Laundry;Light Housekeeping     Transfers: PT Bed Mobility;Bed to Chair;Car;Furniture;Floor  OT Toilet;Tub/Shower     Locomotion: PT Ambulation;Stairs;Wheelchair Mobility     Additional Impairments: OT    SLP        TR      Anticipated Outcomes Item Anticipated Outcome  Self Feeding Independent  Swallowing      Basic self-care  Mod I  Toileting  Mod I   Bathroom Transfers Mod I  Bowel/Bladder  continent bowel and bladder modified independent  Transfers  mod I  Locomotion  supervision  Communication     Cognition     Pain  3 or less on scale of 1-10  Safety/Judgment  supervision   Therapy Plan: PT Intensity: Minimum of 1-2 x/day ,45 to 90 minutes PT Frequency: 5 out of 7 days PT Duration Estimated Length of Stay: 14 days OT Intensity: Minimum of 1-2 x/day, 45 to 90 minutes OT Frequency: 5 out of 7 days OT Duration/Estimated Length of Stay: 10-14 days         Team Interventions: Nursing Interventions Patient/Family Education;Bladder Management;Bowel Management;Disease Management/Prevention;Pain Management;Medication Management;Skin Care/Wound Management;Discharge Planning;Psychosocial Support  PT interventions Ambulation/gait training;Discharge planning;Functional mobility training;Therapeutic Activities;Therapeutic Exercise;Wheelchair propulsion/positioning;Neuromuscular re-education;Balance/vestibular training;DME/adaptive equipment instruction;Splinting/orthotics;UE/LE Strength taining/ROM;UE/LE  Media planner education;Functional electrical stimulation;Community reintegration  OT Interventions Balance/vestibular training;UE/LE Coordination activities;Self Care/advanced ADL retraining;Neuromuscular re-education;Functional mobility training;Therapeutic Activities;Therapeutic Exercise;Patient/family education;DME/adaptive equipment instruction;Discharge planning  SLP Interventions    TR Interventions    SW/CM Interventions Discharge Planning;Psychosocial Support;Patient/Family Education    Team Discharge Planning: Destination: PT-Home ,OT- Home , SLP-  Projected Follow-up: PT-Home health PT;Outpatient PT, OT-  Home health OT, SLP-  Projected Equipment Needs: PT-To be determined, OT- Tub/shower bench;3 in 1 bedside comode, SLP-  Equipment Details: PT- , OT-  Patient/family involved in discharge planning: PT- Patient,  OT-Patient, SLP-   MD ELOS: 2 weeks Medical Rehab Prognosis:  Good Assessment: 57 y.o.right handed male with history of aortic/ mitral valve replacement on chronic Coumadin therapy as well as CVA July of 2012. Presented 07/10/2013 with nausea vomiting and ataxia. Patient was independent prior to admission he does not drive. Cranial CT scan showed a large left acute cerebellar ischemic infarct with mass effect with effacement of the fourth ventricle as well as mild downward compression of the cerebellar tonsils. No signs of hydrocephalus. 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 CT angiogram of the head with no evidence of stenosis or occlusion. INR on admission of 0.96. By report patient has been compliant with his Coumadin. Patient did not receive TPA. Urine drug screen was positive for marijuana. Carotid Dopplers with no ICA stenosis. Echocardiogram with ejection fraction of 60% unchanged from prior studies. Patient remains on chronic Coumadin therapy as well as the addition of intravenous heparin until INR therapeutic. He is  tolerating a regular consistency diet    Now requiring 59/7 Rehab RN,MD, as well as CIR level PT, OT and SLP.  Treatment team will focus on ADLs and mobility with goals set at S/Mod I  See Team Conference Notes for weekly updates to the  plan of care

## 2013-07-17 NOTE — Progress Notes (Signed)
ANTICOAGULATION CONSULT NOTE Pharmacy Consult for Heparin / Coumadin Indication: mechanical MVR and AVR  No Known Allergies  Labs:  Recent Labs  07/15/13 0327 07/15/13 1225 07/16/13 0311 07/16/13 1255 07/16/13 2145 07/17/13 0515  HGB 13.1  --  12.9*  --   --  12.7*  HCT 37.1*  --  37.2*  --   --  36.8*  PLT 250  --  289  --   --  308  LABPROT  --  16.9* 16.3*  --   --  18.4*  INR  --  1.41 1.34  --   --  1.58*  HEPARINUNFRC 0.64 0.38 0.15* 0.16* 0.54 0.52  CREATININE  --   --   --   --   --  1.03    Estimated Creatinine Clearance: 84.8 ml/min (by C-G formula based on Cr of 1.03).  Assessment: 57 y.o. male with mechanical AVR/MVR, new CVA on heparin/coumadin.  INR trending up,  HL slightly supra-therapeutic.  Goal of Therapy:  Heparin level 0.3-0.5 units/ml INR 2.5-3.5 --may need 2 to 3 only Monitor platelets by anticoagulation protocol: Yes   Plan:   -Decrease heparin to 1050 units/hr -Coumadin 15 mg po x 1 -Follow up AM labs  Thank you. Okey Regal, PharmD [phone]   07/17/2013 11:48 AM

## 2013-07-18 ENCOUNTER — Inpatient Hospital Stay (HOSPITAL_COMMUNITY): Payer: Self-pay | Admitting: Occupational Therapy

## 2013-07-18 ENCOUNTER — Inpatient Hospital Stay (HOSPITAL_COMMUNITY): Payer: Medicaid Other

## 2013-07-18 ENCOUNTER — Inpatient Hospital Stay (HOSPITAL_COMMUNITY): Payer: Medicaid Other | Admitting: Physical Therapy

## 2013-07-18 DIAGNOSIS — I69993 Ataxia following unspecified cerebrovascular disease: Secondary | ICD-10-CM

## 2013-07-18 DIAGNOSIS — I6340 Cerebral infarction due to embolism of unspecified cerebral artery: Secondary | ICD-10-CM

## 2013-07-18 LAB — HEPARIN LEVEL (UNFRACTIONATED): Heparin Unfractionated: 0.34 IU/mL (ref 0.30–0.70)

## 2013-07-18 LAB — PROTIME-INR
INR: 2.09 — ABNORMAL HIGH (ref 0.00–1.49)
Prothrombin Time: 22.8 seconds — ABNORMAL HIGH (ref 11.6–15.2)

## 2013-07-18 MED ORDER — WARFARIN SODIUM 2.5 MG PO TABS
12.5000 mg | ORAL_TABLET | Freq: Once | ORAL | Status: AC
  Administered 2013-07-18: 12.5 mg via ORAL
  Filled 2013-07-18: qty 1

## 2013-07-18 NOTE — IPOC Note (Deleted)
Overall Plan of Care [facility]) Patient Details Name: Jeff [name] MRN: [id number] DOB: October 17, 1955  Admitting Diagnosis: CVA  Hospital Problems: Active Problems:   CVA (cerebral infarction)     Functional Problem List: Nursing Bladder;Bowel;Endurance;Medication Management;Pain;Safety  PT Balance;Endurance;Motor  OT Balance;Motor;Safety  SLP    TR         Basic ADL's: OT Bathing;Dressing;Toileting     Advanced  ADL's: OT Simple Meal Preparation;Laundry;Light Housekeeping     Transfers: PT Bed Mobility;Bed to Chair;Car;Furniture;Floor  OT Toilet;Tub/Shower     Locomotion: PT Ambulation;Stairs;Wheelchair Mobility     Additional Impairments: OT    SLP        TR      Anticipated Outcomes Item Anticipated Outcome  Self Feeding Independent  Swallowing      Basic self-care  Mod I  Toileting  Mod I   Bathroom Transfers Mod I  Bowel/Bladder  continent bowel and bladder modified independent  Transfers  mod I  Locomotion  supervision  Communication     Cognition     Pain  3 or less on scale of 1-10  Safety/Judgment  supervision   Therapy Plan: PT Intensity: Minimum of 1-2 x/day ,45 to 90 minutes PT Frequency: 5 out of 7 days PT Duration Estimated Length of Stay: 14 days OT Intensity: Minimum of 1-2 x/day, 45 to 90 minutes OT Frequency: 5 out of 7 days OT Duration/Estimated Length of Stay: 10-14 days         Team Interventions: Nursing Interventions Patient/Family Education;Bladder Management;Bowel Management;Disease Management/Prevention;Pain Management;Medication Management;Skin Care/Wound Management;Discharge Planning;Psychosocial Support  PT interventions Ambulation/gait training;Discharge planning;Functional mobility training;Therapeutic Activities;Therapeutic Exercise;Wheelchair propulsion/positioning;Neuromuscular re-education;Balance/vestibular training;DME/adaptive equipment instruction;Splinting/orthotics;UE/LE Strength taining/ROM;UE/LE  Media planner education;Functional electrical stimulation;Community reintegration  OT Interventions Balance/vestibular training;UE/LE Coordination activities;Self Care/advanced ADL retraining;Neuromuscular re-education;Functional mobility training;Therapeutic Activities;Therapeutic Exercise;Patient/family education;DME/adaptive equipment instruction;Discharge planning  SLP Interventions    TR Interventions    SW/CM Interventions Discharge Planning;Psychosocial Support;Patient/Family Education    Team Discharge Planning: Destination: PT-Home ,OT- Home , SLP-  Projected Follow-up: PT-Home health PT;Outpatient PT, OT-  Home health OT, SLP-  Projected Equipment Needs: PT-To be determined, OT- Tub/shower bench;3 in 1 bedside comode, SLP-  Equipment Details: PT- , OT-  Patient/family involved in discharge planning: PT- Patient,  OT-Patient, SLP-   MD ELOS: 12 days Medical Rehab Prognosis:  Excellent Assessment: The patient has been admitted for CIR therapies. The team will be addressing, functional mobility, strength, stamina, balance, safety, adaptive techniques/equipment, self-care, bowel and bladder mgt, patient and caregiver education, vestibular rx, NMR. Goals have been set at mod I for basic mobility and self-care, supervision for locomotion and safety awareness.    Ranelle Oyster, MD, FAAPMR      See Team Conference Notes for weekly updates to the plan of care

## 2013-07-18 NOTE — Progress Notes (Signed)
Occupational Therapy Session Note  Patient Details  Name: Jeff [name] MRN: [id number] Date of Birth: 1956-01-10  Today's Date: 07/18/2013 Time: 0730-0830 and [id number] Time Calculation (min): 60 min and 28 min   Short Term Goals: Week 1:  OT Short Term Goal 1 (Week 1): Patient will complete toilet transfer with min assist OT Short Term Goal 2 (Week 1): Patient will complete bathing, sitting and standing with supervision for safety OT Short Term Goal 3 (Week 1): Patient will complete dressing at edge of bed or chair with min assist OT Short Term Goal 4 (Week 1): Patient will demonstrate improved dynamic sitting balance during ADL as evidenced by independently correcting impaired postural leaning  Skilled Therapeutic Interventions/Progress Updates:    Session 1: Pt seen for ADL retraining with focus on dynamic standing balance, functional use of LUE, and safety during sit<>stand. Encouraged pt to use LUE during bathing task to address coordination skills. Pt with ataxic movements when reaching for items and increase in ataxia when reaching across midline. Pt completed all sit<>stand with supervision and min cues for pushing up from arm rests. Pt with close supervision during dynamic standing balance tasks and no lob noted. Practiced tub transfer with min guard assist and min verbal cues for technique. At end of session pt returned to room and left sitting in w/c with all needs in reach.  Session 2: Therapy session focused on dynamic standing balance, NMR to LUE, and furniture transfers. Engaged in dynamic reaching task in kitchen to reach into overhead and low cabinets to retrieve items. Focused on using LUE for reach during task. Discussed placing items at home on lower shelves and easy to reach places and pt agreeable. Practiced reaching into low cabinets using LUE to steady on countertop and good safety during task. Practiced furniture transfers from low couch and recliner chair with min guard  assist. Ambulated back to room with increased time and min-supervision with 1 lob posteriorly requiring mod assist to correct. Pt left with all items in reach while sitting in w/c.   Therapy Documentation Precautions:  Precautions Precautions: Fall Restrictions Weight Bearing Restrictions: No General:   Vital Signs: Therapy Vitals Temp: 98.6 F (37 C) Temp src: Oral Pulse Rate: 76 Resp: 16 BP: 154/96 mmHg Patient Position, if appropriate: Lying Oxygen Therapy SpO2: 98 % O2 Device: None (Room air) Pain: No report of pain during therapy sessions.  See FIM for current functional status  Therapy/Group: Individual Therapy  [name] 07/18/2013, 8:31 AM

## 2013-07-18 NOTE — Progress Notes (Signed)
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 Presented 07/10/2013 with nausea vomiting and ataxia. Patient was independent prior to admission he does not drive. Cranial CT scan showed a large left acute cerebellar ischemic infarct with mass effect with effacement of the fourth ventricle as well as mild downward compression of the cerebellar tonsils. No signs of hydrocephalus. 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 CT angiogram of the head with no evidence of stenosis or occlusion. INR on admission of 0.96  Subjective/Complaints: Slept OK, denies hiccups denies nausea or vomiting  Review of Systems - Negative except Poor coordination of left side   Objective: Vital Signs: Blood pressure 154/96, pulse 76, temperature 98.6 F (37 C), temperature source Oral, resp. rate 16, height 6' (1.829 m), weight 79.606 kg (175 lb 8 oz), SpO2 98.00%. Ct Head Wo Contrast  07/16/2013   CLINICAL DATA:  Stroke.  Received TPA.  EXAM: CT HEAD WITHOUT CONTRAST  TECHNIQUE: Contiguous axial images were obtained from the base of the skull through the vertex without intravenous contrast.  COMPARISON:  CT 07/14/2013  FINDINGS: Large area of acute infarct left cerebellum with edema and mass effect on the 4th ventricle. Mild amount of hemorrhage within the infarct is unchanged.  The 4th ventricle is displaced to the right. Third and lateral ventricles show mild progressive dilatation compatible with obstructive hydrocephalus. This shows mild progression from 07/14/2013 and has definitely progressed from the admitting CT of 07/12/2013.  Chronic infarcts in the frontal lobes bilaterally and in the right parietal lobe. Chronic right cerebellar infarct.  IMPRESSION: Hemorrhagic infarct left cerebellum is stable.  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   Electronically Signed   By: Marlan Palau M.D.   On:  07/16/2013 09:18   Results for orders placed during the hospital encounter of 07/16/13 (from the past 72 hour(s))  HEPARIN LEVEL (UNFRACTIONATED)     Status: None   Collection Time    07/16/13  9:45 PM      Result Value Range   Heparin Unfractionated 0.54  0.30 - 0.70 IU/mL   Comment:            IF HEPARIN RESULTS ARE BELOW     EXPECTED VALUES, AND PATIENT     DOSAGE HAS BEEN CONFIRMED,     SUGGEST FOLLOW UP TESTING     OF ANTITHROMBIN III LEVELS.  HEPARIN LEVEL (UNFRACTIONATED)     Status: None   Collection Time    07/17/13  5:15 AM      Result Value Range   Heparin Unfractionated 0.52  0.30 - 0.70 IU/mL   Comment:            IF HEPARIN RESULTS ARE BELOW     EXPECTED VALUES, AND PATIENT     DOSAGE HAS BEEN CONFIRMED,     SUGGEST FOLLOW UP TESTING     OF ANTITHROMBIN III LEVELS.  CBC WITH DIFFERENTIAL     Status: Abnormal   Collection Time    07/17/13  5:15 AM      Result Value Range   WBC 5.8  4.0 - 10.5 K/uL   RBC 3.98 (*) 4.22 - 5.81 MIL/uL   Hemoglobin 12.7 (*) 13.0 - 17.0 g/dL   HCT 16.1 (*) 09.6 - 04.5 %   MCV 92.5  78.0 - 100.0 fL   MCH 31.9  26.0 - 34.0 pg   MCHC 34.5  30.0 - 36.0 g/dL   RDW 16.1  09.6 - 04.5 %   Platelets 308  150 - 400 K/uL   Neutrophils Relative % 55  43 - 77 %   Neutro Abs 3.2  1.7 - 7.7 K/uL   Lymphocytes Relative 26  12 - 46 %   Lymphs Abs 1.5  0.7 - 4.0 K/uL   Monocytes Relative 10  3 - 12 %   Monocytes Absolute 0.6  0.1 - 1.0 K/uL   Eosinophils Relative 9 (*) 0 - 5 %   Eosinophils Absolute 0.5  0.0 - 0.7 K/uL   Basophils Relative 0  0 - 1 %   Basophils Absolute 0.0  0.0 - 0.1 K/uL  COMPREHENSIVE METABOLIC PANEL     Status: Abnormal   Collection Time    07/17/13  5:15 AM      Result Value Range   Sodium 138  135 - 145 mEq/L   Potassium 3.9  3.5 - 5.1 mEq/L   Chloride 104  96 - 112 mEq/L   CO2 26  19 - 32 mEq/L   Glucose, Bld 99  70 - 99 mg/dL   BUN 10  6 - 23 mg/dL   Creatinine, Ser 4.09  0.50 - 1.35 mg/dL   Calcium 8.8  8.4 -  81.1 mg/dL   Total Protein 5.9 (*) 6.0 - 8.3 g/dL   Albumin 3.0 (*) 3.5 - 5.2 g/dL   AST 94 (*) 0 - 37 U/L   ALT 126 (*) 0 - 53 U/L   Alkaline Phosphatase 73  39 - 117 U/L   Total Bilirubin 0.2 (*) 0.3 - 1.2 mg/dL   GFR calc non Af Amer 78 (*) >90 mL/min   GFR calc Af Amer 90 (*) >90 mL/min   Comment: (NOTE)     The eGFR has been calculated using the CKD EPI equation.     This calculation has not been validated in all clinical situations.     eGFR's persistently <90 mL/min signify possible Chronic Kidney     Disease.  PROTIME-INR     Status: Abnormal   Collection Time    07/17/13  5:15 AM      Result Value Range   Prothrombin Time 18.4 (*) 11.6 - 15.2 seconds   INR 1.58 (*) 0.00 - 1.49  HEPARIN LEVEL (UNFRACTIONATED)     Status: None   Collection Time    07/18/13  5:25 AM      Result Value Range   Heparin Unfractionated 0.34  0.30 - 0.70 IU/mL   Comment:            IF HEPARIN RESULTS ARE BELOW     EXPECTED VALUES, AND PATIENT     DOSAGE HAS BEEN CONFIRMED,     SUGGEST FOLLOW UP TESTING     OF ANTITHROMBIN III LEVELS.  PROTIME-INR     Status: Abnormal   Collection Time    07/18/13  5:25 AM      Result Value Range   Prothrombin Time 22.8 (*) 11.6 - 15.2 seconds   INR 2.09 (*) 0.00 - 1.49     HEENT: normal Cardio: RRR and No murmurs Resp: CTA B/L and Unlabored GI: BS positive and Nondistended Extremity:  Pulses positive and No Edema Skin:   Intact Neuro: Alert/Oriented, Cranial Nerve II-XII normal, Normal Sensory, Abnormal Motor /5 in the left deltoid, bicep, tricep, grip, hip flexor, knee extensor ankle dorsiflexor plantar flexor 5/5 on the right side. and Abnormal [facility]  Ataxic/ dec [facility] Musc/Skel:  Normal General no acute distress   Assessment/Plan: 1. Functional deficits secondary to  left cerebellar embolic infarct, hemorrhagic conversion with left hemi ataxia  which require 3+ hours per day of interdisciplinary therapy in a comprehensive inpatient rehab  setting. Physiatrist is providing close team supervision and 24 hour management of active medical problems listed below. Physiatrist and rehab team continue to assess barriers to discharge/monitor patient progress toward functional and medical goals. FIM: FIM - Bathing Bathing Steps Patient Completed: Chest;Right Arm;Left Arm;Abdomen;Front perineal area;Buttocks;Right upper leg;Left upper leg;Right lower leg (including foot);Left lower leg (including foot) Bathing: 4: Steadying assist  FIM - Upper Body Dressing/Undressing Upper body dressing/undressing: 0: Wears gown/pajamas-no public clothing FIM - Lower Body Dressing/Undressing Lower body dressing/undressing steps patient completed: Thread/unthread right underwear leg;Thread/unthread left underwear leg;Pull underwear up/down;Thread/unthread right pants leg;Thread/unthread left pants leg;Pull pants up/down;Fasten/unfasten pants;Don/Doff right sock;Don/Doff left sock Lower body dressing/undressing: 3: Mod-Patient completed 50-74% of tasks  FIM - Toileting Toileting steps completed by patient: Adjust clothing prior to toileting;Performs perineal hygiene;Adjust clothing after toileting Toileting: 4: Steadying assist  FIM - Diplomatic Services operational officer Devices: Walker;Grab bars Toilet Transfers: 3-To toilet/BSC: Mod A (lift or lower assist);3-From toilet/BSC: Mod A (lift or lower assist)  FIM - Bed/Chair Transfer Bed/Chair Transfer: 3: Bed > Chair or W/C: Mod A (lift or lower assist);3: Chair or W/C > Bed: Mod A (lift or lower assist)  FIM - Locomotion: Wheelchair Distance: 25 Locomotion: Wheelchair: 1: Travels less than 50 ft with minimal assistance (Pt.>75%) FIM - Locomotion: Ambulation Ambulation/Gait Assistance: 3: Mod assist Locomotion: Ambulation: 1: Travels less than 50 ft with moderate assistance (Pt: 50 - 74%)  Comprehension Comprehension Mode: Auditory Comprehension: 5-Understands complex 90% of the time/Cues <  10% of the time  Expression Expression Mode: Verbal Expression: 5-Expresses basic needs/ideas: With no assist  Social Interaction Social Interaction: 6-Interacts appropriately with others with medication or extra time (anti-anxiety, antidepressant).  Problem Solving Problem Solving: 5-Solves complex 90% of the time/cues < 10% of the time  Memory Memory: 4-Recognizes or recalls 75 - 89% of the time/requires cueing 10 - 24% of the time  Medical Problem List and Plan:  1. Embolic left cerebellar infarct infarct occurred when INR was somewhat therapeutic. Neurology and neurosurgery aware of hemorrhagic conversion. No progressive neurologic symptoms to warrant discontinuation of anticoagulation at this time, INR>2.0 today goal is >2.5 monitor for neuro worsening, 2. DVT Prophylaxis/Anticoagulation: Chronic Coumadin therapy with history of aortic and mitral valve replacement. Continue heparin until INR greater than 2.5  3. Pain Management: Tylenol as needed  4. Neuropsych: This patient is capable of making decisions on his own behalf.  5. History of alcohol, marijuana and tobacco abuse. Provide full counseling   LOS (Days) 2 A FACE TO FACE EVALUATION WAS PERFORMED  Nareh Matzke E 07/18/2013, 7:58 AM

## 2013-07-18 NOTE — Progress Notes (Signed)
ANTICOAGULATION CONSULT NOTE - Follow Up Consult  Pharmacy Consult for coumadin and heparin Indication: mechanical AVR/MVR  No Known Allergies  Patient Measurements: Height: 6' (182.9 cm) Weight: 175 lb 8 oz (79.606 kg) IBW/kg (Calculated) : 77.6 Heparin Dosing Weight:   Vital Signs: Temp: 98.6 F (37 C) (12/05 0613) Temp src: Oral (12/05 0613) BP: 154/96 mmHg (12/05 0613) Pulse Rate: 76 (12/05 0613)  Labs:  Recent Labs  07/16/13 0311  07/16/13 2145 07/17/13 0515 07/18/13 0525  HGB 12.9*  --   --  12.7*  --   HCT 37.2*  --   --  36.8*  --   PLT 289  --   --  308  --   LABPROT 16.3*  --   --  18.4* 22.8*  INR 1.34  --   --  1.58* 2.09*  HEPARINUNFRC 0.15*  < > 0.54 0.52 0.34  CREATININE  --   --   --  1.03  --   < > = values in this interval not displayed.  Estimated Creatinine Clearance: 84.8 ml/min (by C-G formula based on Cr of 1.03).   Medications:  Scheduled:  . antiseptic oral rinse  15 mL Mouth Rinse BID  . hydrochlorothiazide  12.5 mg Oral Daily  . Warfarin - Pharmacist Dosing Inpatient   Does not apply q1800   Infusions:  . sodium chloride 1,000 mL (07/17/13 2058)  . heparin 1,050 Units/hr (07/17/13 1158)    Assessment: 57 yo male with mechanical AVR/MVR is currently on subtherapeutic coumadin bridging with therapeutic heparin.  INR however jumped from 1.58 to 2.09 today.  Heparin level was 0.34.  Per MD, neurology and neurosurg are aware of hemorrhagic conversion of cerebellar infarct but continue anticoagulation. Goal of Therapy:  6hr heparin level 0.3-0.5; INR 2.5-3.5 Monitor platelets by anticoagulation protocol: Yes   Plan:  1) Cont heparin to 1050 units / hr 2) Coumadin 12.5 mg po x 1 3) Monitor for signs of bleeding   Quina Wilbourne, Tsz-Yin 07/18/2013,8:24 AM

## 2013-07-18 NOTE — Progress Notes (Signed)
Occupational Therapy Session Note  Patient Details  Name: Jeff [name] MRN: [id number] Date of Birth: 07-07-1956  Today's Date: 07/18/2013 Time: 1418-1500 Time Calculation (min): 42 min   Skilled Therapeutic Interventions/Progress Updates:    Pt ambulated with the RW down to the gym with min assist level.  Noted ataxic LLE during mobility.  Once in the gym had pt work in quadriped with emphasis on LUE weightbearing while reaching and placing pegs with the RUE.  Had pt stay in tall kneeling when he needed to give his LUE a rest break.  Also worked on maintaining plank position on toes and forearms to incorporate weightbearing as well.  He was able to maintain for 20 -30 second intervals before fatiguing.  Had pt transition to sitting and work on removing pegs from the pegboard he used earlier, but with the LUE this time.  Demonstrated moderate ataxic movement and at times would accidentally knock another peg out onto the floor when attempting to reach above eye level and to the left of midline.  Pt stood and squated down times 2 to retrieve pegs that were dropped on the floor.  Finished session by having pt ambulate back to his room with the RW.    Therapy Documentation Precautions:  Precautions Precautions: Fall Precaution Comments: ataxic left side Restrictions Weight Bearing Restrictions: No  Pain: Pain Assessment Pain Assessment: No/denies pain ADL: See FIM for current functional status  Therapy/Group: Individual Therapy  Maxmillian Carsey OTR/L 07/18/2013, 4:02 PM

## 2013-07-18 NOTE — Progress Notes (Signed)
Physical Therapy Session Note  Patient Details  Name: Isaih Bulger MRN: [id number] Date of Birth: [birth year]  Today's Date: 07/18/2013 Time: 0902-1002 Time Calculation (min): 60 min  Short Term Goals: Week 1:  PT Short Term Goal 1 (Week 1): Pt will gait with min A in controlled environment min A PT Short Term Goal 2 (Week 1): Pt will demo dynamic standing balance with min A during functional activity  Skilled Therapeutic Interventions/Progress Updates:   Pt received in w/c; transported to gym in w/c total A.  Discussed with pt home entry set up.  Performed balance assessment with BERG, see below for details.  Also practiced one step negotiation with RW x 2 reps with mod A for balance secondary to posterior LOB with max-total verbal and visual cues for safety and sequencing with pt return demonstrating.  Pt vision further tested with horizontal tracking, saccades and gaze stabilization; see below for details.  Will schedule more in depth vestibular evaluation for Monday secondary to impact visual/gaze stabilization on balance.  Therapy Documentation Precautions:  Precautions Precautions: Fall Restrictions Weight Bearing Restrictions: No Pain: Pain Assessment Pain Assessment: No/denies pain Locomotion : Stairs / Additional Locomotion Stairs: Yes Stairs Assistance: 3: Mod assist Stair Management Technique: Step to pattern;Forwards;With walker Number of Stairs: 2 Height of Stairs: 6  Balance: Standardized Balance Assessment Standardized Balance Assessment: Berg Balance Test Berg Balance Test Sit to Stand: Needs minimal aid to stand or to stabilize Standing Unsupported: Unable to stand 30 seconds unassisted Sitting with Back Unsupported but Feet Supported on Floor or Stool: Able to sit safely and securely 2 minutes Stand to Sit: Sits independently, has uncontrolled descent Transfers: Needs one person to assist Standing Unsupported with Eyes Closed: Needs help to keep from  falling Standing Ubsupported with Feet Together: Needs help to attain position and unable to hold for 15 seconds From Standing, Reach Forward with Outstretched Arm: Reaches forward but needs supervision From Standing Position, Pick up Object from Floor: Able to pick up shoe, needs supervision From Standing Position, Turn to Look Behind Over each Shoulder: Needs assist to keep from losing balance and falling Turn 360 Degrees: Needs assistance while turning Standing Unsupported, Alternately Place Feet on Step/Stool: Needs assistance to keep from falling or unable to try Standing Unsupported, One Foot in Front: Loses balance while stepping or standing Standing on One Leg: Unable to try or needs assist to prevent fall Total Score: 11 Patient demonstrates increased fall risk as noted by score of 11/56 on Berg Balance Scale.  (<36= high risk for falls, close to 100%; 37-45 significant >80%; 46-51 moderate >50%; 52-55 lower >25%).  Falls risk and safety discussed with pt and use of RW for transfers and gait to assist with balance and importance of asking for assistance for transfers in room to reduce falls risk.  See FIM for current functional status  Therapy/Group: Individual Therapy  Edman Circle [facility] 07/18/2013, 10:26 AM

## 2013-07-19 ENCOUNTER — Inpatient Hospital Stay (HOSPITAL_COMMUNITY): Payer: Medicaid Other | Admitting: Physical Therapy

## 2013-07-19 ENCOUNTER — Inpatient Hospital Stay (HOSPITAL_COMMUNITY): Payer: Medicaid Other

## 2013-07-19 ENCOUNTER — Inpatient Hospital Stay (HOSPITAL_COMMUNITY): Payer: Medicaid Other | Admitting: Occupational Therapy

## 2013-07-19 DIAGNOSIS — Z954 Presence of other heart-valve replacement: Secondary | ICD-10-CM

## 2013-07-19 DIAGNOSIS — I10 Essential (primary) hypertension: Secondary | ICD-10-CM

## 2013-07-19 DIAGNOSIS — I69993 Ataxia following unspecified cerebrovascular disease: Secondary | ICD-10-CM

## 2013-07-19 DIAGNOSIS — I6330 Cerebral infarction due to thrombosis of unspecified cerebral artery: Secondary | ICD-10-CM

## 2013-07-19 LAB — HEPARIN LEVEL (UNFRACTIONATED): Heparin Unfractionated: 0.41 IU/mL (ref 0.30–0.70)

## 2013-07-19 LAB — CBC
HCT: 37.9 % — ABNORMAL LOW (ref 39.0–52.0)
MCH: 31.9 pg (ref 26.0–34.0)
MCHC: 34 g/dL (ref 30.0–36.0)
MCV: 93.8 fL (ref 78.0–100.0)
RDW: 12.4 % (ref 11.5–15.5)
WBC: 5.7 10*3/uL (ref 4.0–10.5)

## 2013-07-19 LAB — PROTIME-INR: INR: 2.29 — ABNORMAL HIGH (ref 0.00–1.49)

## 2013-07-19 MED ORDER — WARFARIN SODIUM 2.5 MG PO TABS
12.5000 mg | ORAL_TABLET | Freq: Once | ORAL | Status: AC
  Administered 2013-07-19: 12.5 mg via ORAL
  Filled 2013-07-19: qty 1

## 2013-07-19 NOTE — Progress Notes (Signed)
Occupational Therapy Session Note  Patient Details  Name: Jeff [name] MRN: [id number] Date of Birth: November 26, 1955  Today's Date: 07/19/2013 Time: 4098-1191 Time calculation (min): 56 min   Short Term Goals: Week 1:  OT Short Term Goal 1 (Week 1): Patient will complete toilet transfer with min assist OT Short Term Goal 2 (Week 1): Patient will complete bathing, sitting and standing with supervision for safety OT Short Term Goal 3 (Week 1): Patient will complete dressing at edge of bed or chair with min assist OT Short Term Goal 4 (Week 1): Patient will demonstrate improved dynamic sitting balance during ADL as evidenced by independently correcting impaired postural leaning  Skilled Therapeutic Interventions/Progress Updates:    Therapy session focused on NMR to LUE/LLE. Pt ambulated with RW from room>threapy gym with min assist due to ataxic LLE. Engaged in reaching activity while in quadruped with emphasis on LUE and LLE weightbearing while reaching with RUE. Required 2 rest breaks during task. Pt in tall kneeling for rest breaks. Pt then engaged in reaching activity while in quadruped with emphasis on coordination with LUE. Ataxic movements noted. Engaged in reaching activity to hang items on basketball hoop while sitting on the edge of the mat. Therapist resisting reach using theraband for 4 sets x 10 items to hang. Required 3 rest breaks during this task. Pt then completed Nustep workload 6 for 15 min with 2 rest breaks with emphasis on pushing and pulling with LUE/LLE. Pt ambulated back to room with RW with min assist. Pt with lob when waving at another pt in hallway requiring min assist to correct. Pt left sitting in w/c with all needs in reach.   Therapy Documentation Precautions:  Precautions Precautions: Fall Precaution Comments: ataxic left side Restrictions Weight Bearing Restrictions: No General:   Vital Signs:   Pain: No c/o pain during therapy session.   Other Treatments:     See FIM for current functional status  Therapy/Group: Individual Therapy  [name] 07/19/2013, 3:03 PM

## 2013-07-19 NOTE — Progress Notes (Signed)
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 Presented 07/10/2013 with nausea vomiting and ataxia. Patient was independent prior to admission he does not drive. Cranial CT scan showed a large left acute cerebellar ischemic infarct with mass effect with effacement of the fourth ventricle as well as mild downward compression of the cerebellar tonsils. No signs of hydrocephalus. 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 CT angiogram of the head with no evidence of stenosis or occlusion. INR on admission of 0.96  Subjective/Complaints: No complaints. Slept quite well. Happy with therapy progress  Review of Systems - Negative except Poor coordination of left side   Objective: Vital Signs: Blood pressure 144/96, pulse 76, temperature 98.3 F (36.8 C), temperature source Oral, resp. rate 18, height 6' (1.829 m), weight 79.606 kg (175 lb 8 oz), SpO2 99.00%. No results found. Results for orders placed during the hospital encounter of 07/16/13 (from the past 72 hour(s))  HEPARIN LEVEL (UNFRACTIONATED)     Status: None   Collection Time    07/16/13  9:45 PM      Result Value Range   Heparin Unfractionated 0.54  0.30 - 0.70 IU/mL   Comment:            IF HEPARIN RESULTS ARE BELOW     EXPECTED VALUES, AND PATIENT     DOSAGE HAS BEEN CONFIRMED,     SUGGEST FOLLOW UP TESTING     OF ANTITHROMBIN III LEVELS.  HEPARIN LEVEL (UNFRACTIONATED)     Status: None   Collection Time    07/17/13  5:15 AM      Result Value Range   Heparin Unfractionated 0.52  0.30 - 0.70 IU/mL   Comment:            IF HEPARIN RESULTS ARE BELOW     EXPECTED VALUES, AND PATIENT     DOSAGE HAS BEEN CONFIRMED,     SUGGEST FOLLOW UP TESTING     OF ANTITHROMBIN III LEVELS.  CBC WITH DIFFERENTIAL     Status: Abnormal   Collection Time    07/17/13  5:15 AM      Result Value Range   WBC 5.8  4.0 - 10.5 K/uL   RBC 3.98 (*) 4.22 - 5.81  MIL/uL   Hemoglobin 12.7 (*) 13.0 - 17.0 g/dL   HCT 81.1 (*) 91.4 - 78.2 %   MCV 92.5  78.0 - 100.0 fL   MCH 31.9  26.0 - 34.0 pg   MCHC 34.5  30.0 - 36.0 g/dL   RDW 95.6  21.3 - 08.6 %   Platelets 308  150 - 400 K/uL   Neutrophils Relative % 55  43 - 77 %   Neutro Abs 3.2  1.7 - 7.7 K/uL   Lymphocytes Relative 26  12 - 46 %   Lymphs Abs 1.5  0.7 - 4.0 K/uL   Monocytes Relative 10  3 - 12 %   Monocytes Absolute 0.6  0.1 - 1.0 K/uL   Eosinophils Relative 9 (*) 0 - 5 %   Eosinophils Absolute 0.5  0.0 - 0.7 K/uL   Basophils Relative 0  0 - 1 %   Basophils Absolute 0.0  0.0 - 0.1 K/uL  COMPREHENSIVE METABOLIC PANEL     Status: Abnormal   Collection Time    07/17/13  5:15 AM      Result Value Range   Sodium 138  135 -  145 mEq/L   Potassium 3.9  3.5 - 5.1 mEq/L   Chloride 104  96 - 112 mEq/L   CO2 26  19 - 32 mEq/L   Glucose, Bld 99  70 - 99 mg/dL   BUN 10  6 - 23 mg/dL   Creatinine, Ser 1.61  0.50 - 1.35 mg/dL   Calcium 8.8  8.4 - 09.6 mg/dL   Total Protein 5.9 (*) 6.0 - 8.3 g/dL   Albumin 3.0 (*) 3.5 - 5.2 g/dL   AST 94 (*) 0 - 37 U/L   ALT 126 (*) 0 - 53 U/L   Alkaline Phosphatase 73  39 - 117 U/L   Total Bilirubin 0.2 (*) 0.3 - 1.2 mg/dL   GFR calc non Af Amer 78 (*) >90 mL/min   GFR calc Af Amer 90 (*) >90 mL/min   Comment: (NOTE)     The eGFR has been calculated using the CKD EPI equation.     This calculation has not been validated in all clinical situations.     eGFR's persistently <90 mL/min signify possible Chronic Kidney     Disease.  PROTIME-INR     Status: Abnormal   Collection Time    07/17/13  5:15 AM      Result Value Range   Prothrombin Time 18.4 (*) 11.6 - 15.2 seconds   INR 1.58 (*) 0.00 - 1.49  HEPARIN LEVEL (UNFRACTIONATED)     Status: None   Collection Time    07/18/13  5:25 AM      Result Value Range   Heparin Unfractionated 0.34  0.30 - 0.70 IU/mL   Comment:            IF HEPARIN RESULTS ARE BELOW     EXPECTED VALUES, AND PATIENT     DOSAGE  HAS BEEN CONFIRMED,     SUGGEST FOLLOW UP TESTING     OF ANTITHROMBIN III LEVELS.  PROTIME-INR     Status: Abnormal   Collection Time    07/18/13  5:25 AM      Result Value Range   Prothrombin Time 22.8 (*) 11.6 - 15.2 seconds   INR 2.09 (*) 0.00 - 1.49  HEPARIN LEVEL (UNFRACTIONATED)     Status: None   Collection Time    07/19/13  3:30 AM      Result Value Range   Heparin Unfractionated 0.41  0.30 - 0.70 IU/mL   Comment:            IF HEPARIN RESULTS ARE BELOW     EXPECTED VALUES, AND PATIENT     DOSAGE HAS BEEN CONFIRMED,     SUGGEST FOLLOW UP TESTING     OF ANTITHROMBIN III LEVELS.  PROTIME-INR     Status: Abnormal   Collection Time    07/19/13  3:30 AM      Result Value Range   Prothrombin Time 24.5 (*) 11.6 - 15.2 seconds   INR 2.29 (*) 0.00 - 1.49  CBC     Status: Abnormal   Collection Time    07/19/13  3:30 AM      Result Value Range   WBC 5.7  4.0 - 10.5 K/uL   RBC 4.04 (*) 4.22 - 5.81 MIL/uL   Hemoglobin 12.9 (*) 13.0 - 17.0 g/dL   HCT 04.5 (*) 40.9 - 81.1 %   MCV 93.8  78.0 - 100.0 fL   MCH 31.9  26.0 - 34.0 pg   MCHC 34.0  30.0 - 36.0 g/dL  RDW 12.4  11.5 - 15.5 %   Platelets 313  150 - 400 K/uL     HEENT: normal Cardio: RRR and No murmurs Resp: CTA B/L and Unlabored GI: BS positive and Nondistended Extremity:  Pulses positive and No Edema Skin:   Intact Neuro: Alert/Oriented, Cranial Nerve II-XII normal, Normal Sensory, Abnormal Motor /5 in the left deltoid, bicep, tricep, grip, hip flexor, knee extensor ankle dorsiflexor plantar flexor 5/5 on the right side. and Abnormal FMC Ataxic/ dec FMC Musc/Skel:  Normal General no acute distress   Assessment/Plan: 1. Functional deficits secondary to  left cerebellar embolic infarct, hemorrhagic conversion with left hemi ataxia  which require 3+ hours per day of interdisciplinary therapy in a comprehensive inpatient rehab setting. Physiatrist is providing close team supervision and 24 hour management of active  medical problems listed below. Physiatrist and rehab team continue to assess barriers to discharge/monitor patient progress toward functional and medical goals. FIM: FIM - Bathing Bathing Steps Patient Completed: Chest;Right Arm;Left Arm;Abdomen;Front perineal area;Buttocks;Right upper leg;Left upper leg;Right lower leg (including foot);Left lower leg (including foot) Bathing: 5: Supervision: Safety issues/verbal cues  FIM - Upper Body Dressing/Undressing Upper body dressing/undressing steps patient completed: Thread/unthread right sleeve of pullover shirt/dresss;Thread/unthread left sleeve of pullover shirt/dress;Put head through opening of pull over shirt/dress;Pull shirt over trunk Upper body dressing/undressing: 5: Set-up assist to: Obtain clothing/put away FIM - Lower Body Dressing/Undressing Lower body dressing/undressing steps patient completed: Thread/unthread right underwear leg;Thread/unthread left underwear leg;Pull underwear up/down;Thread/unthread right pants leg;Thread/unthread left pants leg;Pull pants up/down;Fasten/unfasten pants;Don/Doff right sock;Don/Doff left sock Lower body dressing/undressing: 5: Set-up assist to: Don/Doff TED stocking  FIM - Toileting Toileting steps completed by patient: Adjust clothing prior to toileting;Performs perineal hygiene;Adjust clothing after toileting Toileting Assistive Devices: Grab bar or rail for support Toileting: 4: Steadying assist  FIM - Diplomatic Services operational officer Devices: Best boy Transfers: 3-To toilet/BSC: Mod A (lift or lower assist);3-From toilet/BSC: Mod A (lift or lower assist)  FIM - Bed/Chair Transfer Bed/Chair Transfer Assistive Devices: Arm rests;Walker Bed/Chair Transfer: 3: Bed > Chair or W/C: Mod A (lift or lower assist);3: Chair or W/C > Bed: Mod A (lift or lower assist)  FIM - Locomotion: Wheelchair Distance: 25 Locomotion: Wheelchair: 1: Total Assistance/staff pushes wheelchair  (Pt<25%) FIM - Locomotion: Ambulation Ambulation/Gait Assistance: 3: Mod assist Locomotion: Ambulation: 0: Activity did not occur  Comprehension Comprehension Mode: Auditory Comprehension: 5-Understands complex 90% of the time/Cues < 10% of the time  Expression Expression Mode: Verbal Expression: 5-Expresses basic needs/ideas: With no assist  Social Interaction Social Interaction: 6-Interacts appropriately with others with medication or extra time (anti-anxiety, antidepressant).  Problem Solving Problem Solving: 5-Solves complex 90% of the time/cues < 10% of the time  Memory Memory: 5-Recognizes or recalls 90% of the time/requires cueing < 10% of the time  Medical Problem List and Plan:  1. Embolic left cerebellar infarct infarct occurred when INR was somewhat therapeutic. Neurology and neurosurgery aware of hemorrhagic conversion. No progressive neurologic symptoms to warrant discontinuation of anticoagulation at this time, INR>2.0 today goal is >2.5 monitor for neuro worsening, 2. DVT Prophylaxis/Anticoagulation: Chronic Coumadin therapy with history of aortic and mitral valve replacement. Continue heparin until INR greater than 2.5  3. Pain Management: Tylenol as needed  4. Neuropsych: This patient is capable of making decisions on his own behalf.  5. History of alcohol, marijuana and tobacco abuse. Provide full counseling  6. HTN: increase hctz to 25mg   LOS (Days) 3 A FACE TO FACE EVALUATION  WAS PERFORMED  SWARTZ,ZACHARY T 07/19/2013, 8:05 AM

## 2013-07-19 NOTE — Progress Notes (Signed)
ANTICOAGULATION CONSULT NOTE - Follow Up Consult  Pharmacy Consult for coumadin and heparin Indication: mechanical AVR/MVR  No Known Allergies  Patient Measurements: Height: 6' (182.9 cm) Weight: 175 lb 8 oz (79.606 kg) IBW/kg (Calculated) : 77.6 Heparin Dosing Weight:   Vital Signs: Temp: 98.3 F (36.8 C) (12/06 0537) Temp src: Oral (12/06 0537) BP: 144/96 mmHg (12/06 0537) Pulse Rate: 76 (12/06 0537)  Labs:  Recent Labs  07/17/13 0515 07/18/13 0525 07/19/13 0330  HGB 12.7*  --  12.9*  HCT 36.8*  --  37.9*  PLT 308  --  313  LABPROT 18.4* 22.8* 24.5*  INR 1.58* 2.09* 2.29*  HEPARINUNFRC 0.52 0.34 0.41  CREATININE 1.03  --   --     Estimated Creatinine Clearance: 84.8 ml/min (by C-G formula based on Cr of 1.03).   Medications:  Scheduled:  . antiseptic oral rinse  15 mL Mouth Rinse BID  . hydrochlorothiazide  12.5 mg Oral Daily  . warfarin  12.5 mg Oral ONCE-1800  . Warfarin - Pharmacist Dosing Inpatient   Does not apply q1800   Infusions:  . sodium chloride 1,000 mL (07/17/13 2058)  . heparin 1,050 Units/hr (07/19/13 0331)    Assessment: 57 yo male with mechanical AVR/MVR is currently on subtherapeutic coumadin bridging with therapeutic heparin.   Per MD, neurology and neurosurg are aware of hemorrhagic conversion of cerebellar infarct and continue anticoagulation.  Heparin level therapeutic today.  INR 2.29 Goal of Therapy:   heparin level 0.3-0.5; INR 2.5-3.5 Monitor platelets by anticoagulation protocol: Yes   Plan:  1) Cont heparin to 1050 units / hr 2) Coumadin 12.5 mg po x 1 3) Monitor for signs of bleeding   Arvella Massingale Poteet 07/19/2013,9:12 AM

## 2013-07-19 NOTE — Progress Notes (Signed)
Physical Therapy Note  Patient Details  Name: Danyel Griess MRN: [id number] Date of Birth: May 07, 1956 Today's Date: 07/19/2013  1100-1155 (55 minutes) individual Pain: no complaint of pain Other: no complaint of nausea/ dizziness Focus of treatment: therapeutic exercise focused on activity tolerance; gait training ; standing balance Treatment: Pt up in wc upon arrival; transfers stand/turn RW min assist ; Nustep Level 5 X 10 minutes LEs only with one rest break; coordination exercises in supine for UEs ( hand to nose, hand to shoulder , arm circles clockwise /counterclockwise ), LEs ( heel to knee); gait with IV pole 80 feet min assist for balance; stepping to targets on floor alternating LEs - pt has difficulty performing  anterior weight shifts in tandem stance.   1300-1325 (25 minutes) individual Pain: no reported pain Other: no reported nausea/dizziness Focus of treatment: Therapeutic activity focused on weight shifts maintaining standing balance Treatment: Kinetron in standing using bilateral UE support 3 x 20 min assist focusing on anterior weight shifts; tandem standing performing reaching activity (horseshoes to basketball hoop) with intermittent posterior loss of balance.; returned to room with all needs within reach.    Dnaiel Voller,JIM 07/19/2013, 11:08 AM

## 2013-07-19 NOTE — Progress Notes (Signed)
Occupational Therapy Session Note  Patient Details  Name: Sherron Mummert MRN: [id number] Date of Birth: [birth year]  Today's Date: 07/19/2013 Time: 1012-1057 Time Calculation (min): 45 min  Skilled Therapeutic Interventions/Progress Updates: Upon approach for therapy patient was independently using bilateral tasks to peel his orange.  This clinician complimented him on initiating this bilateral task on his own.  He was also able to use FM coordination, accuracy and dexterity to pull apart pieces of the orange to eat.  Patient completed bathing and dressing in w/c at sink with focus on bilateral UE coordination and accuracy to wash body and don his clothes.  Initially he stated that he could not get his undershirt or shirt off to wash.  But with one tactile and one verbal cue, he was able to don his 2 shirts.  Bilateral UE ataxia was very minimal for UB dressing.  However, he did need more help to fasten and tie his shoes.    Therapy Documentation Precautions:  Precautions Precautions: Fall Precaution Comments: ataxic left side Restrictions Weight Bearing Restrictions: No  Pain:denied   ASee FIM for current functional status  Therapy/Group: Individual Therapy  Bud Face [facility] 07/19/2013, 3:51 PM

## 2013-07-19 NOTE — Progress Notes (Signed)
Physical Therapy Session Note  Patient Details  Name: Jeff [name] MRN: [id number] Date of Birth: 14-Aug-1956  Today's Date: 07/19/2013 Time: 0908-1002 Time Calculation (min): 54 min  Short Term Goals: Week 1:  PT Short Term Goal 1 (Week 1): Pt will gait with min A in controlled environment min A PT Short Term Goal 2 (Week 1): Pt will demo dynamic standing balance with min A during functional activity  Skilled Therapeutic Interventions/Progress Updates:  Pt was seen bedside in the am. Pt transferred supine to edge of bed with head of bed elevated, side rails and S. Pt transferred sit to stand with min A with rolling walker. Pt ambulated to and from bathroom with rolling walker and min A about 15 feet each way.  Pt propelled w/c with B UEs and LEs about 150 feet with S and verbal cues. Pt performed cone taps in gym to work on weight shifting and unilateral stance, utilized ankle weight on L LE to increase proprioceptive input. Pt ambulated 80 feet x2 with rolling walker and min A, ankle weight on L ankle for increased proprioceptive input, verbal cues for technique. Pt propelled w/c back to room about 150 feet with B UE and LEs with S and verbal cues.   Therapy Documentation Precautions:  Precautions Precautions: Fall Precaution Comments: ataxic left side Restrictions Weight Bearing Restrictions: No General:    Pain: No c/o pain.    Locomotion : Ambulation Ambulation/Gait Assistance: 4: Min assist   See FIM for current functional status  Therapy/Group: Individual Therapy  Rayford Halsted 07/19/2013, 12:40 PM

## 2013-07-20 LAB — CBC
HCT: 38.4 % — ABNORMAL LOW (ref 39.0–52.0)
Hemoglobin: 12.8 g/dL — ABNORMAL LOW (ref 13.0–17.0)
MCH: 31.8 pg (ref 26.0–34.0)
MCHC: 33.3 g/dL (ref 30.0–36.0)
MCV: 95.5 fL (ref 78.0–100.0)
RDW: 12.5 % (ref 11.5–15.5)

## 2013-07-20 LAB — PROTIME-INR: Prothrombin Time: 28.1 seconds — ABNORMAL HIGH (ref 11.6–15.2)

## 2013-07-20 MED ORDER — WARFARIN SODIUM 10 MG PO TABS
10.0000 mg | ORAL_TABLET | Freq: Once | ORAL | Status: AC
  Administered 2013-07-20: 10 mg via ORAL
  Filled 2013-07-20: qty 1

## 2013-07-20 NOTE — Progress Notes (Signed)
ANTICOAGULATION CONSULT NOTE - Follow Up Consult  Pharmacy Consult for coumadin and heparin Indication: mechanical AVR/MVR  No Known Allergies  Patient Measurements: Height: 6' (182.9 cm) Weight: 175 lb 8 oz (79.606 kg) IBW/kg (Calculated) : 77.6 Heparin Dosing Weight:   Vital Signs: Temp: 98.7 F (37.1 C) (12/07 0529) Temp src: Oral (12/07 0529) BP: 133/89 mmHg (12/07 0529) Pulse Rate: 78 (12/07 0529)  Labs:  Recent Labs  07/18/13 0525 07/19/13 0330 07/20/13 0610  HGB  --  12.9* 12.8*  HCT  --  37.9* 38.4*  PLT  --  313 339  LABPROT 22.8* 24.5* 28.1*  INR 2.09* 2.29* 2.74*  HEPARINUNFRC 0.34 0.41 0.27*    Estimated Creatinine Clearance: 84.8 ml/min (by C-G formula based on Cr of 1.03).   Medications:  Scheduled:  . antiseptic oral rinse  15 mL Mouth Rinse BID  . hydrochlorothiazide  12.5 mg Oral Daily  . warfarin  10 mg Oral ONCE-1800  . Warfarin - Pharmacist Dosing Inpatient   Does not apply q1800   Infusions:  . sodium chloride 1,000 mL (07/17/13 2058)    Assessment: 57 yo male with mechanical AVR/MVR is currently on subtherapeutic coumadin bridging with therapeutic heparin.   Per MD, neurology and neurosurg are aware of hemorrhagic conversion of cerebellar infarct and continue anticoagulation.  Heparin level slightly subtherapeutic.  INR 2.74 Goal of Therapy:   heparin level 0.3-0.5; INR 2.5-3.5 Monitor platelets by anticoagulation protocol: Yes   Plan:  1) D/C heparin  2) Coumadin 10 mg po x 1 3) Monitor for signs of bleeding   Temitayo Covalt Poteet 07/20/2013,9:10 AM

## 2013-07-20 NOTE — Progress Notes (Addendum)
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 Presented 07/10/2013 with nausea vomiting and ataxia. Patient was independent prior to admission he does not drive. Cranial CT scan showed a large left acute cerebellar ischemic infarct with mass effect with effacement of the fourth ventricle as well as mild downward compression of the cerebellar tonsils. No signs of hydrocephalus. 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 CT angiogram of the head with no evidence of stenosis or occlusion. INR on admission of 0.96  Subjective/Complaints: No complaints. Slept quite well. Happy with therapy progress  Review of Systems - Negative except Poor coordination of left side   Objective: Vital Signs: Blood pressure 133/89, pulse 78, temperature 98.7 F (37.1 C), temperature source Oral, resp. rate 16, height 6' (1.829 m), weight 79.606 kg (175 lb 8 oz), SpO2 98.00%. No results found. Results for orders placed during the hospital encounter of 07/16/13 (from the past 72 hour(s))  HEPARIN LEVEL (UNFRACTIONATED)     Status: None   Collection Time    07/18/13  5:25 AM      Result Value Range   Heparin Unfractionated 0.34  0.30 - 0.70 IU/mL   Comment:            IF HEPARIN RESULTS ARE BELOW     EXPECTED VALUES, AND PATIENT     DOSAGE HAS BEEN CONFIRMED,     SUGGEST FOLLOW UP TESTING     OF ANTITHROMBIN III LEVELS.  PROTIME-INR     Status: Abnormal   Collection Time    07/18/13  5:25 AM      Result Value Range   Prothrombin Time 22.8 (*) 11.6 - 15.2 seconds   INR 2.09 (*) 0.00 - 1.49  HEPARIN LEVEL (UNFRACTIONATED)     Status: None   Collection Time    07/19/13  3:30 AM      Result Value Range   Heparin Unfractionated 0.41  0.30 - 0.70 IU/mL   Comment:            IF HEPARIN RESULTS ARE BELOW     EXPECTED VALUES, AND PATIENT     DOSAGE HAS BEEN CONFIRMED,     SUGGEST FOLLOW UP TESTING     OF ANTITHROMBIN  III LEVELS.  PROTIME-INR     Status: Abnormal   Collection Time    07/19/13  3:30 AM      Result Value Range   Prothrombin Time 24.5 (*) 11.6 - 15.2 seconds   INR 2.29 (*) 0.00 - 1.49  CBC     Status: Abnormal   Collection Time    07/19/13  3:30 AM      Result Value Range   WBC 5.7  4.0 - 10.5 K/uL   RBC 4.04 (*) 4.22 - 5.81 MIL/uL   Hemoglobin 12.9 (*) 13.0 - 17.0 g/dL   HCT 14.7 (*) 82.9 - 56.2 %   MCV 93.8  78.0 - 100.0 fL   MCH 31.9  26.0 - 34.0 pg   MCHC 34.0  30.0 - 36.0 g/dL   RDW 13.0  86.5 - 78.4 %   Platelets 313  150 - 400 K/uL  HEPARIN LEVEL (UNFRACTIONATED)     Status: Abnormal   Collection Time    07/20/13  6:10 AM      Result Value Range   Heparin Unfractionated 0.27 (*) 0.30 - 0.70 IU/mL   Comment:  IF HEPARIN RESULTS ARE BELOW     EXPECTED VALUES, AND PATIENT     DOSAGE HAS BEEN CONFIRMED,     SUGGEST FOLLOW UP TESTING     OF ANTITHROMBIN III LEVELS.  PROTIME-INR     Status: Abnormal   Collection Time    07/20/13  6:10 AM      Result Value Range   Prothrombin Time 28.1 (*) 11.6 - 15.2 seconds   INR 2.74 (*) 0.00 - 1.49  CBC     Status: Abnormal   Collection Time    07/20/13  6:10 AM      Result Value Range   WBC 5.7  4.0 - 10.5 K/uL   RBC 4.02 (*) 4.22 - 5.81 MIL/uL   Hemoglobin 12.8 (*) 13.0 - 17.0 g/dL   HCT 40.9 (*) 81.1 - 91.4 %   MCV 95.5  78.0 - 100.0 fL   MCH 31.8  26.0 - 34.0 pg   MCHC 33.3  30.0 - 36.0 g/dL   RDW 78.2  95.6 - 21.3 %   Platelets 339  150 - 400 K/uL     HEENT: normal Cardio: RRR and No murmurs Resp: CTA B/L and Unlabored GI: BS positive and Nondistended Extremity:  Pulses positive and No Edema Skin:   Intact Neuro: Alert/Oriented, Cranial Nerve II-XII normal, Normal Sensory, Abnormal Motor /5 in the left deltoid, bicep, tricep, grip, hip flexor, knee extensor ankle dorsiflexor plantar flexor 5/5 on the right side. and Abnormal FMC Ataxic/ dec FMC Musc/Skel:  Normal General no acute  distress   Assessment/Plan: 1. Functional deficits secondary to  left cerebellar embolic infarct, hemorrhagic conversion with left hemi ataxia  which require 3+ hours per day of interdisciplinary therapy in a comprehensive inpatient rehab setting. Physiatrist is providing close team supervision and 24 hour management of active medical problems listed below. Physiatrist and rehab team continue to assess barriers to discharge/monitor patient progress toward functional and medical goals. FIM: FIM - Bathing Bathing Steps Patient Completed: Right Arm;Left Arm;Abdomen;Front perineal area;Buttocks;Chest (elected not to wash  legs or feets) Bathing: 5: Supervision: Safety issues/verbal cues  FIM - Upper Body Dressing/Undressing Upper body dressing/undressing steps patient completed: Thread/unthread right sleeve of pullover shirt/dresss;Pull shirt over trunk;Put head through opening of pull over shirt/dress;Thread/unthread left sleeve of pullover shirt/dress Upper body dressing/undressing: 5: Supervision: Safety issues/verbal cues FIM - Lower Body Dressing/Undressing Lower body dressing/undressing steps patient completed:  (did not don or doff pants or underwear except to wash. he had no other clothes) Lower body dressing/undressing: 0: Activity did not occur  FIM - Toileting Toileting steps completed by patient: Adjust clothing prior to toileting;Performs perineal hygiene;Adjust clothing after toileting Toileting Assistive Devices: Grab bar or rail for support Toileting: 0: Activity did not occur  FIM - Diplomatic Services operational officer Devices: Best boy Transfers: 0-Activity did not occur  FIM - Banker Devices: Arm rests;HOB elevated;Bed rails Bed/Chair Transfer: 5: Supine > Sit: Supervision (verbal cues/safety issues);4: Bed > Chair or W/C: Min A (steadying Pt. > 75%);4: Chair or W/C > Bed: Min A (steadying Pt. > 75%)  FIM -  Locomotion: Wheelchair Distance: 25 Locomotion: Wheelchair: 5: Travels 150 ft or more: maneuvers on rugs and over door sills with supervision, cueing or coaxing FIM - Locomotion: Ambulation Locomotion: Ambulation Assistive Devices: Designer, industrial/product Ambulation/Gait Assistance: 4: Min assist Locomotion: Ambulation: 2: Travels 50 - 149 ft with minimal assistance (Pt.>75%)  Comprehension Comprehension Mode: Auditory Comprehension: 5-Understands basic 90%  of the time/requires cueing < 10% of the time  Expression Expression Mode: Verbal Expression: 5-Expresses basic needs/ideas: With no assist  Social Interaction Social Interaction: 6-Interacts appropriately with others with medication or extra time (anti-anxiety, antidepressant).  Problem Solving Problem Solving: 5-Solves complex 90% of the time/cues < 10% of the time  Memory Memory: 5-Recognizes or recalls 90% of the time/requires cueing < 10% of the time  Medical Problem List and Plan:  1. Embolic left cerebellar infarct infarct occurred when INR was somewhat therapeutic. Neurology and neurosurgery aware of hemorrhagic conversion. No progressive neurologic symptoms to warrant discontinuation of anticoagulation at this time, INR  goal is >2.5    2. DVT Prophylaxis/Anticoagulation: Chronic Coumadin therapy with history of aortic and mitral valve replacement.   -would dc heparin tomorrow if INR is therapeutic 3. Pain Management: Tylenol as needed  4. Neuropsych: This patient is capable of making decisions on his own behalf.  5. History of alcohol, marijuana and tobacco abuse. Provide full counseling  6. HTN: increased hctz to 25mg  for better control  LOS (Days) 4 A FACE TO FACE EVALUATION WAS PERFORMED  Jeff [name] T 07/20/2013, 7:48 AM

## 2013-07-21 ENCOUNTER — Inpatient Hospital Stay (HOSPITAL_COMMUNITY): Payer: Self-pay | Admitting: Occupational Therapy

## 2013-07-21 ENCOUNTER — Inpatient Hospital Stay (HOSPITAL_COMMUNITY): Payer: Self-pay | Admitting: Physical Therapy

## 2013-07-21 ENCOUNTER — Inpatient Hospital Stay (HOSPITAL_COMMUNITY): Payer: Medicaid Other | Admitting: Physical Therapy

## 2013-07-21 ENCOUNTER — Inpatient Hospital Stay (HOSPITAL_COMMUNITY): Payer: Medicaid Other | Admitting: *Deleted

## 2013-07-21 DIAGNOSIS — I10 Essential (primary) hypertension: Secondary | ICD-10-CM

## 2013-07-21 DIAGNOSIS — Z954 Presence of other heart-valve replacement: Secondary | ICD-10-CM

## 2013-07-21 LAB — BASIC METABOLIC PANEL
BUN: 12 mg/dL (ref 6–23)
Chloride: 101 mEq/L (ref 96–112)
Creatinine, Ser: 1.02 mg/dL (ref 0.50–1.35)
GFR calc Af Amer: >90 mL/min (ref >90)

## 2013-07-21 LAB — PROTIME-INR: Prothrombin Time: 25.4 seconds — ABNORMAL HIGH (ref 11.6–15.2)

## 2013-07-21 MED ORDER — WARFARIN SODIUM 7.5 MG PO TABS
15.0000 mg | ORAL_TABLET | Freq: Once | ORAL | Status: AC
  Administered 2013-07-21: 15 mg via ORAL
  Filled 2013-07-21: qty 2

## 2013-07-21 NOTE — Progress Notes (Signed)
Occupational Therapy Session Note  Patient Details  Name: Jeff [name] MRN: [id number] Date of Birth: 11-07-1955  Today's Date: 07/21/2013 Time: 4098-1191 Time Calculation (min): 31 min  Skilled Therapeutic Interventions/Progress Updates:    Pt rode down to the gym in his wheelchair and transferred to the mat in quadriped position.  Worked on weightbearing in quadriped with pt initially using a towel in the left hand to wipe over the mat with emphasis on shoulder flexion and elbow extension.  Transitioned to maintaining balance using the left UE for support while washing the mat with the RUE.  Pt would come into tall kneeling with min assist for rest breaks in between sets.  Finished weightbearing on the mat by having him perform pushups on his knees for 1 set of 15 repetitions.  Transitioned to the UE ergonometer to progress session, with pt only being allowed to use his LUE.  Initial set for 4 mins peddling forward with resistance set at level 15.  After 1 min rest break progressed to peddling reverse for 4 mins at level 15 as well.  Pt able to maintain RPMs during both sets at 18-21.  Pt ambulated back to his room using the RW and min assist.  Pt with slow ataxic gait with advancement of the left side.  Therapist provided weightbearing input at his pelvis as he walked which seemed to improve his performance.    Therapy Documentation Precautions:  Precautions Precautions: Fall Precaution Comments: ataxic left side Restrictions Weight Bearing Restrictions: No  Pain: Pain Assessment Pain Assessment: No/denies pain ADL: See FIM for current functional status  Therapy/Group: Individual Therapy  Ericberto Padget OTR/L 07/21/2013, 3:25 PM

## 2013-07-21 NOTE — Progress Notes (Signed)
Physical Therapy Session Note  Patient Details  Name: Jeff [name] MRN: [id number] Date of Birth: June 03, 1956  Today's Date: 07/21/2013 Time: 0865-7846 Time Calculation (min): 45 min  Short Term Goals: Week 1:  PT Short Term Goal 1 (Week 1): Pt will gait with min A in controlled environment min A PT Short Term Goal 2 (Week 1): Pt will demo dynamic standing balance with min A during functional activity  Skilled Therapeutic Interventions/Progress Updates:    Pt received seated in w/c in pt room; agreeable to PT. Session focused on increasing pt safety/independence with gait, w/c mobility, sit<>stand transfers, and negotiation of single step without hand rail (to facilitate pt safety entering/exiting primary entrance of home).    Self-propulsion of standard w/c in controlled environment x100' using bilat UE's with supervision, mod cueing to facilitate equal force/timing of RUE and LUE motion. Gait [phone] total with multiple 180 and 360-degree turns requiring min A and multimodal cueing to facilitate adequate bilat weight shifting, small steps, and to maintain contact of rolling walker with ground. Pt gave effective within-session carryover of technique.   Therapist explained, demonstrated technique for step negotiation using rolling walker. Pt subsequently negotiated single step with rolling walker requiring min A for stability/balance with effective carryover of sequencing/technique. Multiple sit<>stand transfers from EOM to rolling walker with mod verbal cueing for proper setup, technique, and hand placement; pt with consistent within-session carryover. Therapist transported pt to room in w/c, where session ended. Therapist departed with pt seated in w/c with all needs within reach.  Therapy Documentation Precautions:  Precautions Precautions: Fall Precaution Comments: ataxic left side Restrictions Weight Bearing Restrictions: No Vital Signs: Therapy Vitals Temp: 98.6 F (37 C) Temp src:  Oral Pulse Rate: 102 Resp: 18 BP: 140/85 mmHg Patient Position, if appropriate: Sitting Oxygen Therapy SpO2: 98 % O2 Device: None (Room air) Pain: Pain Assessment Pain Assessment: No/denies pain Pain Score: 0-No pain  See FIM for current functional status  Therapy/Group: Individual Therapy  Calvert Cantor 07/21/2013, 4:37 PM

## 2013-07-21 NOTE — Progress Notes (Signed)
ANTICOAGULATION CONSULT NOTE - Follow Up Consult  Pharmacy Consult for coumadin and heparin Indication: mechanical AVR/MVR  No Known Allergies  Patient Measurements: Height: 6' (182.9 cm) Weight: 175 lb 8 oz (79.606 kg) IBW/kg (Calculated) : 77.6   Vital Signs: Temp: 98.7 F (37.1 C) (12/08 0526) Temp src: Oral (12/08 0526) BP: 134/89 mmHg (12/08 0526) Pulse Rate: 83 (12/08 0526)  Labs:  Recent Labs  07/19/13 0330 07/20/13 0610 07/21/13 0538  HGB 12.9* 12.8*  --   HCT 37.9* 38.4*  --   PLT 313 339  --   LABPROT 24.5* 28.1* 25.4*  INR 2.29* 2.74* 2.40*  HEPARINUNFRC 0.41 0.27*  --   CREATININE  --   --  1.02    Estimated Creatinine Clearance: 85.6 ml/min (by C-G formula based on Cr of 1.02).   Medications:  Scheduled:  . antiseptic oral rinse  15 mL Mouth Rinse BID  . hydrochlorothiazide  12.5 mg Oral Daily  . Warfarin - Pharmacist Dosing Inpatient   Does not apply q1800   Infusions:  . sodium chloride 1,000 mL (07/17/13 2058)    Assessment: 57 yo male with mechanical AVR/MVR is currently on coumadin. Dr. Wynn Banker notes that embolic left cerebellar infarct infarct occurred when INR was somewhat therapeutic.  Per MD, neurology and neurosurgery are aware of hemorrhagic conversion of cerebellar infarct and continue anticoagulation.  Heparin IV drip was discontinued yesterday 07/20/13 when INR reached 2.74.  Today the INR is 2.4, just below goal of  2.5 -3.5.  Down from 2.74 yesterday.  MD notes that there is no progressive neurologic symptoms to warrant discontinuation of anticoagulation at this time. No bleeding note. No significant drug interactions noted.    Goal of Therapy:  INR 2.5-3.5 Monitor platelets by anticoagulation protocol: Yes   Plan:  Coumadin 15 mg po x 1 Monitor for signs of bleeding Daily PT/INR  Noah Delaine, RPh Clinical Pharmacist Pager: [phone] 07/21/2013,1:36 PM

## 2013-07-21 NOTE — Progress Notes (Signed)
Physical Therapy VESTIBULAR Assessment and Plan  Patient Details  Name: Jeff [name] MRN: [id number] Date of Birth: 24-Mar-1956  PT Diagnosis: Dizziness   Today's Date: 07/21/2013 Time: 3086-5784  Time Calculation (min): 62 min  Problem List:  Patient Active Problem List   Diagnosis Date Noted  . CVA (cerebral infarction) 07/16/2013  . Cerebellar stroke, acute 07/13/2013  . Ataxia 07/12/2013  . Vomiting 07/10/2013  . Nausea with vomiting 07/10/2013  . Subtherapeutic international normalized ratio (INR) 07/10/2013  . AKI (acute kidney injury) 07/10/2013  . Volume depletion 07/10/2013  . Nausea & vomiting 07/10/2013  . Cellulitis of left upper arm and forearm 04/04/2012  . History of CVA (cerebrovascular accident)   . Hx of aortic valve replacement, mechanical   . H/O mitral valve replacement with mechanical valve   . HX: long term anticoagulant use     Past Medical History:  Past Medical History  Diagnosis Date  . History of CVA (cerebrovascular accident)     02/2011  . Hx of aortic valve replacement, mechanical   . H/O mitral valve replacement with mechanical valve   . HX: long term anticoagulant use    Past Surgical History:  Past Surgical History  Procedure Laterality Date  . Cardiac valve replacement      Assessment & Plan Clinical Impression: 57 y.o.right handed male with history of aortic/ mitral valve replacement on chronic Coumadin therapy as well as CVA July of 2012. Presented 07/10/2013 with nausea vomiting and ataxia. Patient was independent prior to admission he does not drive. Cranial CT scan showed a large left acute cerebellar ischemic infarct with mass effect with effacement of the fourth ventricle as well as mild downward compression of the cerebellar tonsils. No signs of hydrocephalus. 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 CT angiogram of the head with no evidence of stenosis or occlusion. Patient transferred to CIR on 07/16/2013 .   Pt  currently reporting c/o imbalance and frequent posterior loss of balance with sit <> stands, quick head movements, and with ambulation. Vestibular evaluation requested. Based on findings listed under PT Evaluation pt appears to have visual instability and poor postural reactions in standing due to central deficits from cerebellar infarct.   Recommendations 1] Saccade/Central Pre-programming Exercises: Moves eyes first to target then head, colored spoons and handout provided in room.  2] VOR x 1 Exercises in standing minimizing somatosensory input (from back of legs against chair, walker, therapist hand on back.Marland Kitchen) start with no visual distractions behind target progress to full visual field (such as checkered board behind target/busy gym behind target). Progress once pt has no loss of balance for 60 sec bouts of exercise. Pt needs frequent cues for full head rotation (45 degrees each way) and for smooth movements, feel metronome will be beneficial (2 Hz) 3] Visual Targeting during ambulation and sit <> stand transitions: also incorporate central pre-programming of eyes first then head when turning. Decrease somatosenory input (decr. Use of RW) just for improvements in balance and to challenge vestibular system.   Pt demonstrates difficulty with sustained attention which makes exercises difficult especially in a busy environment. Recommend quiet environment such as room, BI gym, etc until he is stronger at these.   Smooth pursuit impaired, feel free to address during treatment but I feel top three exercises best to focus efforts on at this time.   Patient will benefit from skilled Vestibular PT intervention to maximize safe functional mobility, minimize fall risk and decrease caregiver burden.    Skilled  Therapeutic Intervention Central preprogramming/saccadic exercise, pt performing from handout with mod cues for performance, needs continued practice. VOR x 1 in sitting, practiced performing from  handout, mod verbal cues for frequency and magnitude. Pt given this exercise for exercise in room, would be most beneficial in standing but unsafe to do this by self. I am hopeful that performing exercise in sitting will be conducive to improved magnitude and frequency when performing VOR in standing.  Pt with intra-session improvements with attention to vestibular exercise tasks.     PT Evaluation Eye Alignment WFL  Spontaneous  Nystagmus Negative  Gaze holding nystagmus  ? A few beats to Rt. With Rt. gaze  Smooth pursuit  Impaired, particularly with tracking to Rt.  Oculomotor  Able to move to all ranges, Rt. Beating nystagmus in Rt. Upper quadrant  Saccades  Impaired, over/undershooting   VOR slow  Impaired mostly with head rotation to Rt.   Head Thrust Test  Impaired mostly due to attention?  Head Shaking Nystagmus Not tested  Rt. Hallpike Dix Screen- no dizziness or imbalance with bed mobility  Lt. Hallpike Dix                   "  Rt. Roll Test                   "  Lt. Roll Test                    "  Motion sensitivity No c/o difficulties with TV, people moving around him, or with transitions to/from bed      Visual- Vestibular Interactions:  - Sitting- minimal c/o imbalance or dizziness with VOR x 1 exercise however pt has difficulty maintaining appropriate frequency and magnitude.  - Standing- Posterior loss of balance that worsens with VOR x 1 exercises after 5 sec, requires assist to prevent fall. Pt has even more difficulty with full visual field.  - Walking- impaired balance, significantly ataxic. Worsens without visual targets and without reliance on somatosensory input.    Precautions/Restrictions  Fall  Pain  no c/o          Refer to Care Plan for Long Term Goals- Vestibular improvement incorporated into these goals as pt will require less supervision/assistance with improved balance and visual stability  Recommendations for other services: None  Discharge  Criteria: Patient will be discharged from PT if patient refuses treatment 3 consecutive times without medical reason, if treatment goals not met, if there is a change in medical status, if patient makes no progress towards goals or if patient is discharged from hospital.  The above assessment, treatment plan, treatment alternatives and goals were discussed and mutually agreed upon: by patient  Jeff [name] 07/21/2013, 10:18 AM

## 2013-07-21 NOTE — Progress Notes (Signed)
Occupational Therapy Note Patient Details  Name: Jeff [name] MRN: [id number] Date of Birth: 1956/01/15 Today's Date: 07/21/2013  Time:0800-0900  (60 min) Pain:none Individual session  Engaged in therapeutic bathing and dressing at shower level.   Addressed bed mobility, sit to stand, dynamic sitting and standing balance, LUE coordination and control.  Pt. Was supervision with getting to EOB.  Sat on EOB to lean and reach for clothes in chest of drawers.  LUE with ataxic movements wihen reaching with open chain.  Ambulated with ataxic gait to bathroom using RW.  Had 1 minimal loss of balance when turning to get to toilet.  Had BM.  Ambulated from toilet to shower with shower chair with back.Pt. Bathed entire body but close supervision with sit to stand and standing balance.  He dressed self at EOB and then retrieved dirty laundry.  Went to [employer] and stood at machine to load clothes.  Taken back to room and left in wc with call bell in place.    Humberto Seals 07/21/2013, 12:13 PM

## 2013-07-21 NOTE — Progress Notes (Signed)
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 Presented 07/10/2013 with nausea vomiting and ataxia. Patient was independent prior to admission he does not drive. Cranial CT scan showed a large left acute cerebellar ischemic infarct with mass effect with effacement of the fourth ventricle as well as mild downward compression of the cerebellar tonsils. No signs of hydrocephalus. 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 CT angiogram of the head with no evidence of stenosis or occlusion. INR on admission of 0.96  Subjective/Complaints: No pain c/o , happy to be free of IV pole  Review of Systems - Negative except Poor coordination of left side   Objective: Vital Signs: Blood pressure 134/89, pulse 83, temperature 98.7 F (37.1 C), temperature source Oral, resp. rate 18, height 6' (1.829 m), weight 79.606 kg (175 lb 8 oz), SpO2 100.00%. No results found. Results for orders placed during the hospital encounter of 07/16/13 (from the past 72 hour(s))  HEPARIN LEVEL (UNFRACTIONATED)     Status: None   Collection Time    07/19/13  3:30 AM      Result Value Range   Heparin Unfractionated 0.41  0.30 - 0.70 IU/mL   Comment:            IF HEPARIN RESULTS ARE BELOW     EXPECTED VALUES, AND PATIENT     DOSAGE HAS BEEN CONFIRMED,     SUGGEST FOLLOW UP TESTING     OF ANTITHROMBIN III LEVELS.  PROTIME-INR     Status: Abnormal   Collection Time    07/19/13  3:30 AM      Result Value Range   Prothrombin Time 24.5 (*) 11.6 - 15.2 seconds   INR 2.29 (*) 0.00 - 1.49  CBC     Status: Abnormal   Collection Time    07/19/13  3:30 AM      Result Value Range   WBC 5.7  4.0 - 10.5 K/uL   RBC 4.04 (*) 4.22 - 5.81 MIL/uL   Hemoglobin 12.9 (*) 13.0 - 17.0 g/dL   HCT 25.3 (*) 66.4 - 40.3 %   MCV 93.8  78.0 - 100.0 fL   MCH 31.9  26.0 - 34.0 pg   MCHC 34.0  30.0 - 36.0 g/dL   RDW 47.4  25.9 - 56.3 %   Platelets  313  150 - 400 K/uL  HEPARIN LEVEL (UNFRACTIONATED)     Status: Abnormal   Collection Time    07/20/13  6:10 AM      Result Value Range   Heparin Unfractionated 0.27 (*) 0.30 - 0.70 IU/mL   Comment:            IF HEPARIN RESULTS ARE BELOW     EXPECTED VALUES, AND PATIENT     DOSAGE HAS BEEN CONFIRMED,     SUGGEST FOLLOW UP TESTING     OF ANTITHROMBIN III LEVELS.  PROTIME-INR     Status: Abnormal   Collection Time    07/20/13  6:10 AM      Result Value Range   Prothrombin Time 28.1 (*) 11.6 - 15.2 seconds   INR 2.74 (*) 0.00 - 1.49  CBC     Status: Abnormal   Collection Time    07/20/13  6:10 AM      Result Value Range   WBC 5.7  4.0 - 10.5 K/uL   RBC 4.02 (*) 4.22 - 5.81 MIL/uL  Hemoglobin 12.8 (*) 13.0 - 17.0 g/dL   HCT 78.2 (*) 95.6 - 21.3 %   MCV 95.5  78.0 - 100.0 fL   MCH 31.8  26.0 - 34.0 pg   MCHC 33.3  30.0 - 36.0 g/dL   RDW 08.6  57.8 - 46.9 %   Platelets 339  150 - 400 K/uL     HEENT: normal Cardio: RRR and No murmurs Resp: CTA B/L and Unlabored GI: BS positive and Nondistended Extremity:  Pulses positive and No Edema Skin:   Intact Neuro: Alert/Oriented, Cranial Nerve II-XII normal, Normal Sensory, Abnormal Motor /5 in the left deltoid, bicep, tricep, grip, hip flexor, knee extensor ankle dorsiflexor plantar flexor 5/5 on the right side. and Abnormal FMC Ataxic/ dec FMC Musc/Skel:  Normal General no acute distress   Assessment/Plan: 1. Functional deficits secondary to  left cerebellar embolic infarct, hemorrhagic conversion with left hemi ataxia  which require 3+ hours per day of interdisciplinary therapy in a comprehensive inpatient rehab setting. Physiatrist is providing close team supervision and 24 hour management of active medical problems listed below. Physiatrist and rehab team continue to assess barriers to discharge/monitor patient progress toward functional and medical goals. FIM: FIM - Bathing Bathing Steps Patient Completed: Right Arm;Left  Arm;Abdomen;Front perineal area;Buttocks;Chest (elected not to wash  legs or feets) Bathing: 5: Supervision: Safety issues/verbal cues  FIM - Upper Body Dressing/Undressing Upper body dressing/undressing steps patient completed: Thread/unthread right sleeve of pullover shirt/dresss;Pull shirt over trunk;Put head through opening of pull over shirt/dress;Thread/unthread left sleeve of pullover shirt/dress Upper body dressing/undressing: 5: Supervision: Safety issues/verbal cues FIM - Lower Body Dressing/Undressing Lower body dressing/undressing steps patient completed:  (did not don or doff pants or underwear except to wash. he had no other clothes) Lower body dressing/undressing: 0: Activity did not occur  FIM - Toileting Toileting steps completed by patient: Adjust clothing prior to toileting;Performs perineal hygiene;Adjust clothing after toileting Toileting Assistive Devices: Grab bar or rail for support Toileting: 0: Activity did not occur  FIM - Diplomatic Services operational officer Devices: Grab bars;Walker Event organiser: 0-Activity did not occur  FIM - Banker Devices: Arm rests Bed/Chair Transfer: 4: Chair or W/C > Bed: Min A (steadying Pt. > 75%);4: Bed > Chair or W/C: Min A (steadying Pt. > 75%)  FIM - Locomotion: Wheelchair Distance: 25 Locomotion: Wheelchair: 5: Travels 150 ft or more: maneuvers on rugs and over door sills with supervision, cueing or coaxing FIM - Locomotion: Ambulation Locomotion: Ambulation Assistive Devices: Designer, industrial/product Ambulation/Gait Assistance: 4: Min assist Locomotion: Ambulation: 2: Travels 50 - 149 ft with minimal assistance (Pt.>75%)  Comprehension Comprehension Mode: Auditory Comprehension: 5-Understands basic 90% of the time/requires cueing < 10% of the time  Expression Expression Mode: Verbal Expression: 5-Expresses basic needs/ideas: With no assist  Social Interaction Social  Interaction: 6-Interacts appropriately with others with medication or extra time (anti-anxiety, antidepressant).  Problem Solving Problem Solving: 5-Solves complex 90% of the time/cues < 10% of the time  Memory Memory: 5-Recognizes or recalls 90% of the time/requires cueing < 10% of the time  Medical Problem List and Plan:  1. Embolic left cerebellar infarct infarct occurred when INR was somewhat therapeutic. Neurology and neurosurgery aware of hemorrhagic conversion. No progressive neurologic symptoms to warrant discontinuation of anticoagulation at this time, INR>2.0 today goal is >2.5 monitor for neuro worsening, 2. DVT Prophylaxis/Anticoagulation: Chronic Coumadin therapy with history of aortic and mitral valve replacement. 3. Pain Management: Tylenol as needed  4.  Neuropsych: This patient is capable of making decisions on his own behalf.  5. History of alcohol, marijuana and tobacco abuse. Provide full counseling   LOS (Days) 5 A FACE TO FACE EVALUATION WAS PERFORMED  Jeff [name] E 07/21/2013, 6:45 AM

## 2013-07-22 ENCOUNTER — Inpatient Hospital Stay (HOSPITAL_COMMUNITY): Payer: Medicaid Other

## 2013-07-22 ENCOUNTER — Inpatient Hospital Stay (HOSPITAL_COMMUNITY): Payer: Medicaid Other | Admitting: Physical Therapy

## 2013-07-22 DIAGNOSIS — I6330 Cerebral infarction due to thrombosis of unspecified cerebral artery: Secondary | ICD-10-CM

## 2013-07-22 DIAGNOSIS — Z954 Presence of other heart-valve replacement: Secondary | ICD-10-CM

## 2013-07-22 DIAGNOSIS — I69993 Ataxia following unspecified cerebrovascular disease: Secondary | ICD-10-CM

## 2013-07-22 DIAGNOSIS — I10 Essential (primary) hypertension: Secondary | ICD-10-CM

## 2013-07-22 LAB — PROTIME-INR: Prothrombin Time: 25.4 seconds — ABNORMAL HIGH (ref 11.6–15.2)

## 2013-07-22 MED ORDER — WARFARIN SODIUM 7.5 MG PO TABS
15.0000 mg | ORAL_TABLET | Freq: Once | ORAL | Status: AC
  Administered 2013-07-22: 15 mg via ORAL
  Filled 2013-07-22: qty 2

## 2013-07-22 NOTE — Progress Notes (Signed)
Social Work Patient ID: Jeff [name], male   DOB: [birth year], 57 y.o.   MRN: [id number] Spoke with Jeff [name] who was concerned about medical follow up for pt.  Have contacted clinic where pt goes to get a follow up appointment. Informed dan he does see Jeff [name] at the clinic.  Have an appointment for 08/15/2013 at 8;15 am.  Informed pt and will work on discharge Plans.  Pt is pleased with his progress.

## 2013-07-22 NOTE — Progress Notes (Signed)
Occupational Therapy Session Note  Patient Details  Name: Jeff [name] MRN: [id number] Date of Birth: 1955-10-08  Today's Date: 07/22/2013 Time: 1030-1130 and [id number] Time Calculation (min): 60 min and 43 min   Short Term Goals: Week 1:  OT Short Term Goal 1 (Week 1): Patient will complete toilet transfer with min assist OT Short Term Goal 2 (Week 1): Patient will complete bathing, sitting and standing with supervision for safety OT Short Term Goal 3 (Week 1): Patient will complete dressing at edge of bed or chair with min assist OT Short Term Goal 4 (Week 1): Patient will demonstrate improved dynamic sitting balance during ADL as evidenced by independently correcting impaired postural leaning  Skilled Therapeutic Interventions/Progress Updates:    Session 1: Pt seen for ADL retraining with focus on functional transfers, dynamic standing balance, and controlled reach with LUE. Pt ambulated w/c to bathroom with RW and close supervision with ataxic movements noted. Completed bathing and dressing with supervision and no lob during sit<>stand. Required min assist when completing shower transfer as pt had LOB laterally during turning with RW. Completed oral care in sitting with focus on controlled reach with LUE. Ataxic movement noted when reaching away from body and carryover of technique to keep UE's close to body when applying toothpaste to decrease ataxic movements. Ambulated to ADL apartment with therapist facilitating at pelvis for input to decrease ataxic movements and this seemed to help. Completed tub transfer with supervision and ambulated back to room with close supervision and min cues to focus while ambulating as pt was distracted by other therapists and patients. Pt left sitting in w/c with all needs in reach.   Session 2: Therapy session focused on NMR to LUE/LLE, activity tolerance, and dynamic balance. Pt ambulated with RW gym>day room with 1 rest break and close supervision and min  ataxic movements. Engaged in reaching activity while in quadruped with emphasis on LLE/LUE weight bearing while hanging ornaments on christmas tree. Took 1 rest break while in high kneeling. Completed weight bearing while preforming pushups on knees 2 sets 10 reps. Ambulated to therapy gym with 1 LOB during turn change requiring min assist to correct. Engaged in BUE tasks with emphasis of using LUE dominantly while completing pipe puzzle. Pt continues to have ataxic movements however pt having improved control. Engaged in tossing ball and catching with BUE and increased coordination noted when cued pt to visually attend to UE's and slow movements. At end of session pt returned to room and left with all needs in reach.   Therapy Documentation Precautions:  Precautions Precautions: Fall Precaution Comments: ataxic left side Restrictions Weight Bearing Restrictions: No General:   Vital Signs:   Pain: No c/o pain during therapy sessions.   See FIM for current functional status  Therapy/Group: Individual Therapy  [name] 07/22/2013, 12:14 PM

## 2013-07-22 NOTE — Progress Notes (Signed)
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 Presented 07/10/2013 with nausea vomiting and ataxia. Patient was independent prior to admission he does not drive. Cranial CT scan showed a large left acute cerebellar ischemic infarct with mass effect with effacement of the fourth ventricle as well as mild downward compression of the cerebellar tonsils. No signs of hydrocephalus. 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 CT angiogram of the head with no evidence of stenosis or occlusion. INR on admission of 0.96  Subjective/Complaints:  No pain c/o ,good appetite, no bowel or bladder dysfunction Drinking well  Review of Systems - Negative except Poor coordination of left side   Objective: Vital Signs: Blood pressure 135/89, pulse 84, temperature 98.1 F (36.7 C), temperature source Oral, resp. rate 18, height 6' (1.829 m), weight 79.606 kg (175 lb 8 oz), SpO2 97.00%. No results found. Results for orders placed during the hospital encounter of 07/16/13 (from the past 72 hour(s))  HEPARIN LEVEL (UNFRACTIONATED)     Status: Abnormal   Collection Time    07/20/13  6:10 AM      Result Value Range   Heparin Unfractionated 0.27 (*) 0.30 - 0.70 IU/mL   Comment:            IF HEPARIN RESULTS ARE BELOW     EXPECTED VALUES, AND PATIENT     DOSAGE HAS BEEN CONFIRMED,     SUGGEST FOLLOW UP TESTING     OF ANTITHROMBIN III LEVELS.  PROTIME-INR     Status: Abnormal   Collection Time    07/20/13  6:10 AM      Result Value Range   Prothrombin Time 28.1 (*) 11.6 - 15.2 seconds   INR 2.74 (*) 0.00 - 1.49  CBC     Status: Abnormal   Collection Time    07/20/13  6:10 AM      Result Value Range   WBC 5.7  4.0 - 10.5 K/uL   RBC 4.02 (*) 4.22 - 5.81 MIL/uL   Hemoglobin 12.8 (*) 13.0 - 17.0 g/dL   HCT 78.4 (*) 69.6 - 29.5 %   MCV 95.5  78.0 - 100.0 fL   MCH 31.8  26.0 - 34.0 pg   MCHC 33.3  30.0 - 36.0 g/dL   RDW 28.4  13.2 - 44.0 %   Platelets 339  150 - 400 K/uL  PROTIME-INR     Status: Abnormal   Collection Time    07/21/13  5:38 AM      Result Value Range   Prothrombin Time 25.4 (*) 11.6 - 15.2 seconds   INR 2.40 (*) 0.00 - 1.49  BASIC METABOLIC PANEL     Status: Abnormal   Collection Time    07/21/13  5:38 AM      Result Value Range   Sodium 135  135 - 145 mEq/L   Potassium 4.2  3.5 - 5.1 mEq/L   Chloride 101  96 - 112 mEq/L   CO2 28  19 - 32 mEq/L   Glucose, Bld 86  70 - 99 mg/dL   BUN 12  6 - 23 mg/dL   Creatinine, Ser 1.02  0.50 - 1.35 mg/dL   Calcium 9.0  8.4 - 72.5 mg/dL   GFR calc non Af Amer 79 (*) >90 mL/min   GFR calc Af Amer >90  >90 mL/min   Comment: (NOTE)     The eGFR has been  calculated using the CKD EPI equation.     This calculation has not been validated in all clinical situations.     eGFR's persistently <90 mL/min signify possible Chronic Kidney     Disease.  PROTIME-INR     Status: Abnormal   Collection Time    07/22/13  6:25 AM      Result Value Range   Prothrombin Time 25.4 (*) 11.6 - 15.2 seconds   INR 2.40 (*) 0.00 - 1.49     HEENT: normal Cardio: RRR and No murmurs Resp: CTA B/L and Unlabored GI: BS positive and Nondistended Extremity:  Pulses positive and No Edema Skin:   Intact Neuro: Alert/Oriented, Cranial Nerve II-XII normal, Normal Sensory, Abnormal Motor /5 in the left deltoid, bicep, tricep, grip, hip flexor, knee extensor ankle dorsiflexor plantar flexor 5/5 on the right side. and Abnormal FMC Ataxic/ dec FMC Musc/Skel:  Normal General no acute distress   Assessment/Plan: 1. Functional deficits secondary to  left cerebellar embolic infarct, hemorrhagic conversion with left hemi ataxia  which require 3+ hours per day of interdisciplinary therapy in a comprehensive inpatient rehab setting. Physiatrist is providing close team supervision and 24 hour management of active medical problems listed below. Physiatrist and rehab team continue  to assess barriers to discharge/monitor patient progress toward functional and medical goals. FIM: FIM - Bathing Bathing Steps Patient Completed: Right Arm;Left Arm;Abdomen;Front perineal area;Buttocks;Chest;Right upper leg;Left upper leg;Right lower leg (including foot);Left lower leg (including foot) Bathing: 5: Supervision: Safety issues/verbal cues  FIM - Upper Body Dressing/Undressing Upper body dressing/undressing steps patient completed: Thread/unthread right sleeve of pullover shirt/dresss;Pull shirt over trunk;Put head through opening of pull over shirt/dress;Thread/unthread left sleeve of pullover shirt/dress Upper body dressing/undressing: 5: Supervision: Safety issues/verbal cues FIM - Lower Body Dressing/Undressing Lower body dressing/undressing steps patient completed: Thread/unthread right underwear leg;Thread/unthread left underwear leg;Pull underwear up/down;Thread/unthread right pants leg;Thread/unthread left pants leg;Pull pants up/down;Fasten/unfasten pants;Don/Doff right sock;Don/Doff left sock Lower body dressing/undressing: 4: Steadying Assist  FIM - Toileting Toileting steps completed by patient: Adjust clothing prior to toileting;Performs perineal hygiene;Adjust clothing after toileting Toileting Assistive Devices: Grab bar or rail for support Toileting: 4: Steadying assist  FIM - Diplomatic Services operational officer Devices: Grab bars;Walker Toilet Transfers: 4-To toilet/BSC: Min A (steadying Pt. > 75%);4-From toilet/BSC: Min A (steadying Pt. > 75%)  FIM - Bed/Chair Transfer Bed/Chair Transfer Assistive Devices: Arm rests;Walker Bed/Chair Transfer: 4: Chair or W/C > Bed: Min A (steadying Pt. > 75%);4: Bed > Chair or W/C: Min A (steadying Pt. > 75%)  FIM - Locomotion: Wheelchair Distance: 100 Locomotion: Wheelchair: 2: Travels 50 - 149 ft with supervision, cueing or coaxing FIM - Locomotion: Ambulation Locomotion: Ambulation Assistive Devices: Dealer Ambulation/Gait Assistance: 4: Min assist Locomotion: Ambulation: 4: Travels 150 ft or more with minimal assistance (Pt.>75%)  Comprehension Comprehension Mode: Auditory Comprehension: 5-Understands basic 90% of the time/requires cueing < 10% of the time  Expression Expression Mode: Verbal Expression: 5-Expresses basic needs/ideas: With no assist  Social Interaction Social Interaction: 6-Interacts appropriately with others with medication or extra time (anti-anxiety, antidepressant).  Problem Solving Problem Solving: 5-Solves complex 90% of the time/cues < 10% of the time  Memory Memory: 5-Recognizes or recalls 90% of the time/requires cueing < 10% of the time  Medical Problem List and Plan:  1. Embolic left cerebellar infarct infarct occurred when INR was somewhat therapeutic. Neurology and neurosurgery aware of hemorrhagic conversion. No progressive neurologic symptoms to warrant discontinuation of anticoagulation at this time, INR>2.0  today goal is >2.5 monitor for neuro worsening, 2. DVT Prophylaxis/Anticoagulation: Chronic Coumadin therapy with history of aortic and mitral valve replacement. 3. Pain Management: Tylenol as needed  4. Neuropsych: This patient is capable of making decisions on his own behalf.  5. History of alcohol, marijuana and tobacco abuse. Provide full counseling   LOS (Days) 6 A FACE TO FACE EVALUATION WAS PERFORMED  Press Casale E 07/22/2013, 7:13 AM

## 2013-07-22 NOTE — Progress Notes (Signed)
Physical Therapy Session Note  Patient Details  Name: Jeff [name] MRN: [id number] Date of Birth: Apr 05, 1956  Today's Date: 07/22/2013 Time: 0800-0900 and 1300-1330 Time Calculation (min): 60 min and 30 min  Short Term Goals: Week 1:  PT Short Term Goal 1 (Week 1): Pt will gait with min A in controlled environment min A PT Short Term Goal 2 (Week 1): Pt will demo dynamic standing balance with min A during functional activity  Skilled Therapeutic Interventions/Progress Updates:    Treatment Session 1: Pt received semi-reclined in bed; agreeable to therapy. Session focused on increasing pt independence with ambulation, negotiation of single step to enter home, functional transfers, and functional standing balance. Supine>sit with supervision, bed rail, HOB elevated. Gait 2x150' with rolling walker and min guard to min A for stability/balance. Initial 150' gait trial with 1 lb cuff weights at bilat ankles to increase proprioceptive input; pt reports feeling more stable during gait, but no significant change in stability/independence is noted with py wearing cuff weights. Pt performed Berg Balance Scale, scoring 22/56 (as compared with 11/56 on 12/3); see test for detailed findings. Car transfer with rolling walker requiring supervision. Negotiation of single step with no rails using rolling walker requiring min A; pt with effective between-session carryover of technique. Second 150' gait trial (to return to room) without ankle cuff weights with verbal cueing to address pt tendency to lift L/front aspect of rolling walker from floor.Therapist departed pt room with pt seated in w/c with all needs within reach.  Treatment Session 2: Pt received seated in w/c with visitors present; agreeable to therapy. Session focused on promoting pt utilization of balance recovery reactions with balance perturbations. Gait x150' to therapy gym with rolling walker, min guard to min A for stability/balance. See below for  NMR/balance interventions. Negotiation of 8 stairs with step-to pattern, bilat hand rails for NMR; min A and verbal cueing for sequencing with effective return demonstration. Gait x150' (to return to pt room) with rolling walker, min guard to min A for stability/balance. Therapist departed pt room with pt seated in w/c with all needs within reach.  Therapy Documentation Precautions:  Precautions Precautions: Fall Precaution Comments: ataxic left side Restrictions Weight Bearing Restrictions: No Pain:   Pt reports no pain during AM/PM sessions. Locomotion : Ambulation Ambulation/Gait Assistance: 4: Min assist  Balance: Once in gym, performed the following NMR with pt standing with back against wall: utilization of ankle/hip strategies to lift shoulders/hips off wall to facilitate pt use of balance recovery reactions. Ankle strategy delayed/inadequate on LLE initially; increasingly improved with increased repetitions. Standing in front of mat table with AP/PA and lateral balance perturbations directed at pt's hips, shoulders in all directions.  Pt exhibits improved consistency of ankle strategy (R>L) with AP/PA balance perturbations. Hip strategy intact with perturbations in all directions. R stepping strategy intact with AP perturbations. Balance recovery reactions inconsistent/ineffective with lateral perturbations to L side.   See FIM for current functional status  Therapy/Group: Individual Therapy  [place], Lorenda Ishihara 07/22/2013, 12:45 PM

## 2013-07-22 NOTE — Progress Notes (Signed)
ANTICOAGULATION CONSULT NOTE - Follow Up Consult  Pharmacy Consult for coumadin and heparin Indication: mechanical AVR/MVR  No Known Allergies  Patient Measurements: Height: 6' (182.9 cm) Weight: 175 lb 8 oz (79.606 kg) IBW/kg (Calculated) : 77.6   Vital Signs: Temp: 98.1 F (36.7 C) (12/09 0500) Temp src: Oral (12/09 0500) BP: 135/89 mmHg (12/09 0500) Pulse Rate: 84 (12/09 0500)  Labs:  Recent Labs  07/20/13 0610 07/21/13 0538 07/22/13 0625  HGB 12.8*  --   --   HCT 38.4*  --   --   PLT 339  --   --   LABPROT 28.1* 25.4* 25.4*  INR 2.74* 2.40* 2.40*  HEPARINUNFRC 0.27*  --   --   CREATININE  --  1.02  --     Estimated Creatinine Clearance: 85.6 ml/min (by C-G formula based on Cr of 1.02).   Medications:  Scheduled:  . antiseptic oral rinse  15 mL Mouth Rinse BID  . hydrochlorothiazide  12.5 mg Oral Daily  . Warfarin - Pharmacist Dosing Inpatient   Does not apply q1800   Infusions:  . sodium chloride 1,000 mL (07/17/13 2058)    Assessment: INR 2.4, unchanged from yesterday and INR is just below goal of 2.5-3.5 for this 57 yo male on chronic coumadin for h/o mechanical AVR/MVR and CVA (02/2011).  This admit for an embolic left cerebellar infarct infarct occurred when INR was somewhat therapeutic.  Per MD, neurology and neurosurgery are aware of hemorrhagic conversion of cerebellar infarct and continue anticoagulation.   Heparin IV drip was discontinued on 07/20/13 when INR reached goal.  INR now slightly below goal. Antiembolism stockings (TED hose) documented. INR has decreased after dose lowered from 15mg  x 2 days to 12.5 mg, 12.5mg , 10mg , (12/5 thru 12/7).  MD notes that there is no progressive neurologic symptoms to warrant discontinuation of anticoagulation at this time. No bleeding note. No significant drug interactions noted.  I increased dose yesterday to 15 mg.  Will repeat 15 mg today.    Goal of Therapy:  INR 2.5-3.5 Monitor platelets by  anticoagulation protocol: Yes   Plan:  Coumadin 15 mg po today  x 1 Monitor for signs of bleeding Daily PT/INR  Noah Delaine, RPh Clinical Pharmacist Pager: [phone] 07/22/2013,2:28 PM

## 2013-07-23 ENCOUNTER — Inpatient Hospital Stay (HOSPITAL_COMMUNITY): Payer: Medicaid Other | Admitting: Physical Therapy

## 2013-07-23 ENCOUNTER — Inpatient Hospital Stay (HOSPITAL_COMMUNITY): Payer: Medicaid Other

## 2013-07-23 ENCOUNTER — Inpatient Hospital Stay (HOSPITAL_COMMUNITY): Payer: Self-pay

## 2013-07-23 LAB — PROTIME-INR
INR: 3.04 — ABNORMAL HIGH (ref 0.00–1.49)
Prothrombin Time: 30.4 seconds — ABNORMAL HIGH (ref 11.6–15.2)

## 2013-07-23 MED ORDER — WARFARIN SODIUM 10 MG PO TABS
12.5000 mg | ORAL_TABLET | Freq: Once | ORAL | Status: AC
  Administered 2013-07-23: 12.5 mg via ORAL
  Filled 2013-07-23: qty 1

## 2013-07-23 NOTE — Progress Notes (Signed)
ANTICOAGULATION CONSULT NOTE - Follow Up Consult  Pharmacy Consult for coumadin Indication: mechanical AVR/MVR  No Known Allergies  Patient Measurements: Height: 6' (182.9 cm) Weight: 175 lb 3.2 oz (79.47 kg) IBW/kg (Calculated) : 77.6   Vital Signs: Temp: 98.3 F (36.8 C) (12/10 0540) Temp src: Oral (12/10 0540) BP: 138/98 mmHg (12/10 0826) Pulse Rate: 92 (12/10 0540)  Labs:  Recent Labs  07/21/13 0538 07/22/13 0625 07/23/13 0430  LABPROT 25.4* 25.4* 30.4*  INR 2.40* 2.40* 3.04*  CREATININE 1.02  --   --     Estimated Creatinine Clearance: 85.6 ml/min (by C-G formula based on Cr of 1.02).   Medications:  Scheduled:  . antiseptic oral rinse  15 mL Mouth Rinse BID  . hydrochlorothiazide  12.5 mg Oral Daily  . Warfarin - Pharmacist Dosing Inpatient   Does not apply q1800   Infusions:  . 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  This admit for an embolic left cerebellar infarct infarct occurred when INR was SUBtherapeutic (0.96 on admission 07/10/13). MD noted patient stated he had been compliant with his coumadin taking 5 mg daily PTA.  Per MD, neurology and neurosurgery are aware of hemorrhagic conversion of cerebellar infarct and continue anticoagulation. No bleeding note. No significant drug interactions noted.  Goal of Therapy:  INR 2.5-3.5 Monitor platelets by anticoagulation protocol: Yes   Plan:  Coumadin 12.5 mg po today  x 1 Monitor for signs of bleeding Daily PT/INR  Noah Delaine, RPh Clinical Pharmacist Pager: [phone] 07/23/2013,12:29 PM

## 2013-07-23 NOTE — Progress Notes (Signed)
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 Presented 07/10/2013 with nausea vomiting and ataxia. Patient was independent prior to admission he does not drive. Cranial CT scan showed a large left acute cerebellar ischemic infarct with mass effect with effacement of the fourth ventricle as well as mild downward compression of the cerebellar tonsils. No signs of hydrocephalus. 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 CT angiogram of the head with no evidence of stenosis or occlusion. INR on admission of 0.96  Subjective/Complaints:  Feels ok. States he's walking up and down steps in therapy.  Review of Systems - Negative except Poor coordination of left side   Objective: Vital Signs: Blood pressure 142/101, pulse 92, temperature 98.3 F (36.8 C), temperature source Oral, resp. rate 18, height 6' (1.829 m), weight 79.606 kg (175 lb 8 oz), SpO2 100.00%. No results found. Results for orders placed during the hospital encounter of 07/16/13 (from the past 72 hour(s))  PROTIME-INR     Status: Abnormal   Collection Time    07/21/13  5:38 AM      Result Value Range   Prothrombin Time 25.4 (*) 11.6 - 15.2 seconds   INR 2.40 (*) 0.00 - 1.49  BASIC METABOLIC PANEL     Status: Abnormal   Collection Time    07/21/13  5:38 AM      Result Value Range   Sodium 135  135 - 145 mEq/L   Potassium 4.2  3.5 - 5.1 mEq/L   Chloride 101  96 - 112 mEq/L   CO2 28  19 - 32 mEq/L   Glucose, Bld 86  70 - 99 mg/dL   BUN 12  6 - 23 mg/dL   Creatinine, Ser 4.40  0.50 - 1.35 mg/dL   Calcium 9.0  8.4 - 10.2 mg/dL   GFR calc non Af Amer 79 (*) >90 mL/min   GFR calc Af Amer >90  >90 mL/min   Comment: (NOTE)     The eGFR has been calculated using the CKD EPI equation.     This calculation has not been validated in all clinical situations.     eGFR's persistently <90 mL/min signify possible Chronic Kidney     Disease.   PROTIME-INR     Status: Abnormal   Collection Time    07/22/13  6:25 AM      Result Value Range   Prothrombin Time 25.4 (*) 11.6 - 15.2 seconds   INR 2.40 (*) 0.00 - 1.49  PROTIME-INR     Status: Abnormal   Collection Time    07/23/13  4:30 AM      Result Value Range   Prothrombin Time 30.4 (*) 11.6 - 15.2 seconds   INR 3.04 (*) 0.00 - 1.49     HEENT: normal Cardio: RRR and No murmurs Resp: CTA B/L and Unlabored GI: BS positive and Nondistended Extremity:  Pulses positive and No Edema Skin:   Intact Neuro: Alert/Oriented, Cranial Nerve II-XII normal, Normal Sensory, Abnormal Motor /5 in the left deltoid, bicep, tricep, grip, hip flexor, knee extensor ankle dorsiflexor plantar flexor 5/5 on the right side. and Abnormal FMC Ataxic/ dec FMC Musc/Skel:  Normal General no acute distress   Assessment/Plan: 1. Functional deficits secondary to  left cerebellar embolic infarct, hemorrhagic conversion with left hemi ataxia  which require 3+ hours per day of interdisciplinary therapy in a comprehensive inpatient rehab setting. Physiatrist is providing close team  supervision and 24 hour management of active medical problems listed below. Physiatrist and rehab team continue to assess barriers to discharge/monitor patient progress toward functional and medical goals. FIM: FIM - Bathing Bathing Steps Patient Completed: Right Arm;Left Arm;Abdomen;Front perineal area;Buttocks;Chest;Right upper leg;Left upper leg;Right lower leg (including foot);Left lower leg (including foot) Bathing: 5: Supervision: Safety issues/verbal cues  FIM - Upper Body Dressing/Undressing Upper body dressing/undressing steps patient completed: Thread/unthread right sleeve of pullover shirt/dresss;Pull shirt over trunk;Put head through opening of pull over shirt/dress;Thread/unthread left sleeve of pullover shirt/dress Upper body dressing/undressing: 5: Set-up assist to: Obtain clothing/put away FIM - Lower Body  Dressing/Undressing Lower body dressing/undressing steps patient completed: Thread/unthread right underwear leg;Thread/unthread left underwear leg;Pull underwear up/down;Thread/unthread right pants leg;Thread/unthread left pants leg;Pull pants up/down;Fasten/unfasten pants;Don/Doff right sock;Don/Doff left sock Lower body dressing/undressing: 5: Set-up assist to: Obtain clothing  FIM - Toileting Toileting steps completed by patient: Adjust clothing prior to toileting;Performs perineal hygiene;Adjust clothing after toileting Toileting Assistive Devices: Grab bar or rail for support Toileting: 6: More than reasonable amount of time  FIM - Diplomatic Services operational officer Devices: Grab bars;Walker Toilet Transfers: 5-To toilet/BSC: Supervision (verbal cues/safety issues);5-From toilet/BSC: Supervision (verbal cues/safety issues)  FIM - Press photographer Assistive Devices: Arm rests;Walker Bed/Chair Transfer: 4: Chair or W/C > Bed: Min A (steadying Pt. > 75%);4: Bed > Chair or W/C: Min A (steadying Pt. > 75%)  FIM - Locomotion: Wheelchair Distance: 100 Locomotion: Wheelchair: 2: Travels 50 - 149 ft with supervision, cueing or coaxing FIM - Locomotion: Ambulation Locomotion: Ambulation Assistive Devices: Designer, industrial/product Ambulation/Gait Assistance: 4: Min guard Locomotion: Ambulation: 4: Travels 150 ft or more with minimal assistance (Pt.>75%)  Comprehension Comprehension Mode: Auditory Comprehension: 5-Understands basic 90% of the time/requires cueing < 10% of the time  Expression Expression Mode: Verbal Expression: 5-Expresses basic needs/ideas: With no assist  Social Interaction Social Interaction: 6-Interacts appropriately with others with medication or extra time (anti-anxiety, antidepressant).  Problem Solving Problem Solving: 5-Solves basic problems: With no assist  Memory Memory: 5-Recognizes or recalls 90% of the time/requires cueing < 10% of  the time  Medical Problem List and Plan:  1. Embolic left cerebellar infarct infarct occurred when INR was somewhat therapeutic. Neurology and neurosurgery aware of hemorrhagic conversion. No progressive neurologic symptoms to warrant discontinuation of anticoagulation at this time, INR>2.0 today goal is >2.5 monitor for neuro worsening, 2. DVT Prophylaxis/Anticoagulation: Chronic Coumadin therapy with history of aortic and mitral valve replacement. 3. Pain Management: Tylenol as needed  4. Neuropsych: This patient is capable of making decisions on his own behalf.  5. History of alcohol, marijuana and tobacco abuse. Provide full counseling   LOS (Days) 7 A FACE TO FACE EVALUATION WAS PERFORMED  KIRSTEINS,ANDREW E 07/23/2013, 7:36 AM

## 2013-07-23 NOTE — Progress Notes (Signed)
Physical Therapy Session Note  Patient Details  Name: Jeff [name] MRN: [id number] Date of Birth: Dec 03, 1955  Today's Date: 07/23/2013 Time: 1530-1600 Time Calculation (min): 30 min  Short Term Goals: Week 1:  PT Short Term Goal 1 (Week 1): Pt will gait with min A in controlled environment min A PT Short Term Goal 2 (Week 1): Pt will demo dynamic standing balance with min A during functional activity  Skilled Therapeutic Interventions/Progress Updates:    Session focused on gait training with RW in controlled environment and through obstacle course to simulate dynamic gait in home environment, neuro re-ed in standing with min A for alternating toe taps on 2" block for coordination and balance x 10 reps x 2 sets, and LE strengthening x 10 min on Nustep level 6 (LE's only).   Therapy Documentation Precautions:  Precautions Precautions: Fall Precaution Comments: ataxic left side Restrictions Weight Bearing Restrictions: No Pain: Pain Assessment Pain Assessment: No/denies pain Pain Score: 0-No pain Locomotion : Ambulation Ambulation/Gait Assistance: 4: Min assist   See FIM for current functional status  Therapy/Group: Individual Therapy  Karolee Stamps [facility] [place] 07/23/2013, 4:19 PM

## 2013-07-23 NOTE — Progress Notes (Signed)
Occupational Therapy Session Note  Patient Details  Name: Jeff [name] MRN: [id number] Date of Birth: [birth year]  Today's Date: 07/23/2013 Time: 1117-1200 and 7829-5621 Time Calculation (min): 43 min and 42 min   Short Term Goals: Week 1:  OT Short Term Goal 1 (Week 1): Patient will complete toilet transfer with min assist OT Short Term Goal 2 (Week 1): Patient will complete bathing, sitting and standing with supervision for safety OT Short Term Goal 3 (Week 1): Patient will complete dressing at edge of bed or chair with min assist OT Short Term Goal 4 (Week 1): Patient will demonstrate improved dynamic sitting balance during ADL as evidenced by independently correcting impaired postural leaning  Skilled Therapeutic Interventions/Progress Updates:    Session 1: Therapy session focused on NMR to LUE/LLE and dynamic standing balance. Engaged in clothes pins task (various resistance) while in quadruped to increase weightbearing. Pt placing pins with RUE and removing with LUE with ataxic movements noted. Engaged in dynamic reaching task while in tall kneeling for weight bearing through LLE and using BUE to place weighted ball in basketball hoop. Emphasis on slow controlled movements with LUE. Completed 2 sets 15 reps of push-ups from knees and 2 sets x15 reps wall push-ups facilitating weight bearing through LUE. Engaged in UE ergonometer with resistance at 15. Pt using LUE only. Completed 4:30 forward then 4:30 backwards. At end of session pt ambulated back to room with min guard to close supervision. Pt's neighbor present and asking therapist if there was anything she could get for pt. Discussed a non-slip mat and she reported she would purchase one. She also reported she would be assisting pt with home management tasks (cooking, cleaning, etc.)   Session 2: Therapy session focused on ADL retraining and dynamic balance during functional ambulation. Pt ambulated room>walk-in shower with close  supervision. Completed bathing from sit<>stand level with cues to not rely on grab bars for balance however limited carryover of this. Completed dressing from w/c level and pt requiring min assist for balance when managing clothing around waist d/t posterior LOB. Completed oral care at sink with ataxic movements noted when reaching for grooming items with LUE. At end of session, ambulated short distances in hallway with focus on head turns and turn changes to challenge pt's balance. Pt with mild lob 3-4 times during this activity requiring min assist to correct. At end of session pt left with all items in reach while sitting in w/c.   Therapy Documentation Precautions:  Precautions Precautions: Fall Precaution Comments: ataxic left side Restrictions Weight Bearing Restrictions: No General:   Vital Signs: Therapy Vitals BP: 138/98 mmHg Pain: No report of pain during therapy sessions.   See FIM for current functional status  Therapy/Group: Individual Therapy  [name] 07/23/2013, 11:46 AM

## 2013-07-23 NOTE — Progress Notes (Signed)
Physical Therapy Session Note  Patient Details  Name: Jeff [name] MRN: [id number] Date of Birth: 11-11-1955  Today's Date: 07/23/2013 Time: 7829-5621 and 3086-5784 Time Calculation (min): 30 min and 44 min  Short Term Goals: Week 1:  PT Short Term Goal 1 (Week 1): Pt will gait with min A in controlled environment min A PT Short Term Goal 2 (Week 1): Pt will demo dynamic standing balance with min A during functional activity  Skilled Therapeutic Interventions/Progress Updates:    Treatment Session 1: Pt received seated in w/c; agreeable to therapy. Session focused on assessing/addressing pt stability/independence with functional transfers, gait in controlled/home environments. Gait 2x200' in controlled environment with min guard during linear gait, min A during direction change. Gait in home (carpeted) environment with min A, increased time. In apartment, performed multiple sit<>stands from couch with supervision to min A (for anterior weight shift), min cueing for technique. Supine<>sit in apartment bed without rails, HOB flat with no assist. Negotiation of single step and standard curb without rails using rolling walker x2 trials, min A; cueing for sequencing.  Treatment Session 2: Pt received seated in w/c; agreeable to therapy. Session focused on assessing/addressing dynamic standing balance, direction changes with gait, and pt stability/independence with gait in crowded, distracting environments. Gait with rolling walker x180' in controlled environment with min guard, 1x150' in crowded, distracting environment with min A. Pt noted to frequently turn head toward other persons, noises in hallway/gym. Gait x25' total in home (carpeted) environment with min A, increased time. Gait x65' with min A in crowded environments with obstacles and with crowded, distracting surroundings. Additional gait trial x150' total with multiple turns with manual facilitation of equal weight shifting, verbal cueing for  small steps and to maintain rolling walker in contact with ground. Seated rest breaks (~2-3 minutes each) taken between each gait trial, per pt request, secondary to pt fatigue. Mod cueing for attention to task secondary to decreased gait stability when pt turning head toward/attending to surrounding people. See below for detailed description of NMR. Therapist departed pt room with pt seated in w/c with all needs met.  Therapy Documentation Precautions:  Precautions Precautions: Fall Precaution Comments: ataxic left side Restrictions Weight Bearing Restrictions: No Vital Signs: Therapy Vitals Temp: 98.2 F (36.8 C) Temp src: Oral Pulse Rate: 104 Resp: 18 BP: 138/99 mmHg Patient Position, if appropriate: Sitting Oxygen Therapy SpO2: 100 % O2 Device: None (Room air) Pain: Pt reports no pain during AM/PM sessions.  Locomotion : Ambulation Ambulation/Gait Assistance: 4: Min guard  Balance: Balance Balance Assessed: Yes Dynamic Standing Balance Dynamic Standing - Balance Support: No upper extremity supported Dynamic Standing - Level of Assistance: 5: Stand by assistance;4: Min assist Dynamic Standing - Balance Activities: Lateral lean/weight shifting;Forward lean/weight shifting;Ball toss;Reaching for objects;Reaching across midline Dynamic Standing - Comments: Dynamic standing balance x12 minutes total during basketball activity with SBA-min guard for stability/balance.. Hip and ankle strategy (R>L) consistently noted with AP/PA balance perturbations. See FIM for current functional status  Therapy/Group: Individual Therapy  Gabrianna Fassnacht, Lorenda Ishihara 07/23/2013, 7:02 PM

## 2013-07-23 NOTE — Progress Notes (Signed)
Social Work Patient ID: Jeff [name], male   DOB: Sep 18, 1955, 58 y.o.   MRN: [id number] Met with pt to inform him of team conference goals of mod/i level and discharge 12/16.  He is pleased with his progress and will have his neighbor come in for training For the step he needs to get up at home.  Agreeable to DME and follow up therapies.  Will work toward discharge next [employer]

## 2013-07-23 NOTE — Patient Care Conference (Signed)
Inpatient RehabilitationTeam Conference and Plan of Care Update Date: 07/23/2013   Time: 10;50 Am    Patient Name: Jeff [name]      Medical Record Number: [id number]  Date of Birth: August 21, 1955 Sex: Male         Room/Bed: 4M06C/4M06C-01 Payor Info: Payor: MEDICAID PENDING / Plan: MEDICAID PENDING / Product Type: *No Product type* /    Admitting Diagnosis: CVA  Admit Date/Time:  07/16/2013  2:14 PM Admission Comments: No comment available   Primary Diagnosis:  <principal problem not specified> Principal Problem: <principal problem not specified>  Patient Active Problem List   Diagnosis Date Noted  . CVA (cerebral infarction) 07/16/2013  . Cerebellar stroke, acute 07/13/2013  . Ataxia 07/12/2013  . Vomiting 07/10/2013  . Nausea with vomiting 07/10/2013  . Subtherapeutic international normalized ratio (INR) 07/10/2013  . AKI (acute kidney injury) 07/10/2013  . Volume depletion 07/10/2013  . Nausea & vomiting 07/10/2013  . Cellulitis of left upper arm and forearm 04/04/2012  . History of CVA (cerebrovascular accident)   . Hx of aortic valve replacement, mechanical   . H/O mitral valve replacement with mechanical valve   . HX: long term anticoagulant use     Expected Discharge Date: Expected Discharge Date: 07/29/13  Team Members Present: Physician leading conference: Dr. Claudette Laws Social Worker Present: Dossie Der, LCSW Nurse Present: Gregor Hams, RN PT Present: Wanda Plump, PT OT Present: Bretta Bang, Carollee Sires, OT SLP Present: Maxcine Ham, SLP PPS Coordinator present : Tora Duck, RN, CRRN     Current Status/Progress Goal Weekly Team Focus  Medical   off IV heparin , therapeutic INR  avoid re bleed  monitor clinical status and INR   Bowel/Bladder   Patient is continent of bowel and bladder  Patient will remain continent of bowel and bladder during stay  continue toileting PRN   Swallow/Nutrition/ Hydration     [facility]        ADL's   supvision-min assist; LOB occasionally with min assist to correct  Mod I overall   L NMR, dynamic standing balance, functional transfers, safety, education, strengthening   Mobility   Mod  I with bed mobility, bed<>chair with min guard-minA; Gait >150' with min guard-min A  Mod  I with bed mobility, bed<>chair with mod I; Gait x100' with supervision  Increasing stability/safety with change of direction during gait; transfer training; hands-on caregiver training   Communication   patient able to voice all needs appropriately. Alert and oriented x4.   Patient will remain alert and oriented x4 during his stay  Cognition checks q shift and PRN   Safety/Cognition/ Behavioral Observations  Patient without safety issues. Calls appropriately.    Patient will be free from fall and /or injury during stay  Continue to encourage use of call bell system and wait for assistance with transfers   Pain   denies pain  patient will remain with pain level of 3 or less on 1/10 scale during stay  Monitor for S/S of pain q shift and PRN   Skin   No skin issues  patient will be free from infection and/or breakdown during stay  skin checks q shift and PRN       *See Care Plan and progress notes for long and short-term goals.  Barriers to Discharge: ataxia requiring Physical assist    Possible Resolutions to Barriers:  upgrade mobitlity +/- train caregiver    Discharge Planning/Teaching Needs:  HOme with family checking in on him, could  go to daughter's but he doesn't want to      Team Discussion:  Making good progress and will have neighbor come in for family training.  Loses balance when turns, working on this.  Revisions to Treatment Plan:  No issues   Continued Need for Acute Rehabilitation Level of Care: The patient requires daily medical management by a physician with specialized training in physical medicine and rehabilitation for the following conditions: Daily direction of a multidisciplinary  physical rehabilitation program to ensure safe treatment while eliciting the highest outcome that is of practical value to the patient.: Yes Daily medical management of patient stability for increased activity during participation in an intensive rehabilitation regime.: Yes Daily analysis of laboratory values and/or radiology reports with any subsequent need for medication adjustment of medical intervention for : Cardiac problems;Neurological problems  Lucy Chris 07/24/2013, 9:27 AM

## 2013-07-24 ENCOUNTER — Inpatient Hospital Stay (HOSPITAL_COMMUNITY): Payer: Medicaid Other

## 2013-07-24 ENCOUNTER — Inpatient Hospital Stay (HOSPITAL_COMMUNITY): Payer: Self-pay | Admitting: Rehabilitation

## 2013-07-24 ENCOUNTER — Inpatient Hospital Stay (HOSPITAL_COMMUNITY): Payer: Medicaid Other | Admitting: Physical Therapy

## 2013-07-24 DIAGNOSIS — I69993 Ataxia following unspecified cerebrovascular disease: Secondary | ICD-10-CM

## 2013-07-24 DIAGNOSIS — I10 Essential (primary) hypertension: Secondary | ICD-10-CM

## 2013-07-24 DIAGNOSIS — I6330 Cerebral infarction due to thrombosis of unspecified cerebral artery: Secondary | ICD-10-CM

## 2013-07-24 DIAGNOSIS — Z954 Presence of other heart-valve replacement: Secondary | ICD-10-CM

## 2013-07-24 LAB — PROTIME-INR: Prothrombin Time: 36.6 seconds — ABNORMAL HIGH (ref 11.6–15.2)

## 2013-07-24 NOTE — Progress Notes (Signed)
Physical Therapy Session Note  Patient Details  Name: Jeff [name] MRN: [id number] Date of Birth: 08-04-1956  Today's Date: 07/24/2013 Time: 1001-1028 Time Calculation (min): 27 min  Short Term Goals: Week 1:  PT Short Term Goal 1 (Week 1): Pt will gait with min A in controlled environment min A PT Short Term Goal 2 (Week 1): Pt will demo dynamic standing balance with min A during functional activity  Skilled Therapeutic Interventions/Progress Updates:   Pt received sitting on therapy mat in gym, having just finished previous PT session.  Pt agreeable to next therapy session.  Focus of session was high level gait around obstacles in close proximity x 40' x 2 reps.  Performed first rep without weighted belt and required mod assist (without RW as well) with cues for increased step width and completely turning throughout task, as he initially performed side stepping through cones.  Performed second rep with weight belt donned in order to provide increased proprioceptive feedback to decrease ataxic movement patterns.  Note during second rep, he did demonstrate less ataxic movement, however he require intermittent mod A for LOB.  Performed standing activity with weight belt donned reaching down to to L to increased WB through LLE for resistance clothes pins and reaching to place them on pole with L hand in order to increase fine motor skills and increased coordination of movement in LUE.  Note he did miss/drop 3/7 pins during activity.  Requires min to mod assist to maintain balance.  Ended session with negotiating single step to simulate home entry with use of RW forwards at min assist with cues for correct sequencing/technique.  Ambulated back to room with RW and weighted belt at min/guard assist level.  Pt left in w/c in room with all needs in reach.   Therapy Documentation Precautions:  Precautions Precautions: Fall Precaution Comments: ataxic left side Restrictions Weight Bearing Restrictions:  No   Pain: Pt reports no pain or dizziness during session.    Locomotion : Ambulation Ambulation/Gait Assistance: 4: Min guard   See FIM for current functional status  Therapy/Group: Individual Therapy  Vista Deck 07/24/2013, 1:05 PM

## 2013-07-24 NOTE — Progress Notes (Signed)
Occupational Therapy Weekly Progress Note  Patient Details  Name: Jeff [name] MRN: [id number] Date of Birth: 1956/03/01  Today's Date: 07/24/2013 Time: 1478-2956 and 2130-8657 Time Calculation (min): 60 min and 42 min   Patient has met 3 of 4 short term goals.  Patient has been progressing during his rehab stay and has been limited mostly by ataxic movements. Patient occasionally requires min assist for standing balance during self-care tasks during dynamic standing tasks. Patient requires min-mod cues during therapy sessions to slow pace when completing functional transfers and during functional ambulation. Pt has been engaging in quadruped position during functional activities with emphasis on weight bearing through LLE/LUE to assist with breaking ataxic movements. Patient has been practicing IADL tasks in preparation for discharge home next week and continues to demonstrate improvement in these areas. Patient's neighbor will also be assisting with home management tasks after discharge.   Patient continues to demonstrate the following deficits: decreased coordination, ataxia, decreased strength, decreased postural control in standing, decreased awareness, impaired problem solving and therefore will continue to benefit from skilled OT intervention to enhance overall performance with BADL and iADL.  Patient progressing toward long term goals..  Continue plan of care.  OT Short Term Goals Week 1:  OT Short Term Goal 1 (Week 1): Patient will complete toilet transfer with min assist OT Short Term Goal 1 - Progress (Week 1): Met OT Short Term Goal 2 (Week 1): Patient will complete bathing, sitting and standing with supervision for safety OT Short Term Goal 2 - Progress (Week 1): Partly met OT Short Term Goal 3 (Week 1): Patient will complete dressing at edge of bed or chair with min assist OT Short Term Goal 3 - Progress (Week 1): Met OT Short Term Goal 4 (Week 1): Patient will demonstrate improved  dynamic sitting balance during ADL as evidenced by independently correcting impaired postural leaning OT Short Term Goal 4 - Progress (Week 1): Met Week 2:  OT Short Term Goal 1 (Week 2): Focus on LTGs  Skilled Therapeutic Interventions/Progress Updates:    Session 1: Pt seen for ADL retraining with focus on dynamic standing balance, controlled reaching with LUE, and functional transfers. Pt received sitting in w/c. Ambulated to walk-in shower with min assist using RW. Pt with LOB posteriorly during bathing and LB dressing requiring min assist to correct. Pt ambulated without AD back to room with min assist. Completed oral care in standing with no LOB. Ambulated to laundry room with RW and holding laundry bag string with LUE with min-close supervision. Engaged in NMR to LUE/LLE via weightbearing while in quadruped and engaging in reaching task. Engaged in reaching task with therapist applying resistance to LUE via theraband. At end of session pt ambulated back to room with min guard and left with all needs in reach.   Session 2: Therapy session focused on dynamic standing balance, functional use of LUE, and awareness during home management tasks. Pt ambulated room to laundry room with close supervision using RW. Completed laundry task with supervision. Pt then engaged in meal prep task using stovetop. Pt retrieved items from high and low cabinets and cues for sliding objects down countertop for safety. RW was more of a barrier and tripping hazard during task so removed and pt ambulated short distances in kitchen with min guard assist. Pt used LUE to open cabinet doors and when stirring items with ataxic movements noted. Pt then washed dishes using BUE and close supervision for balance. At end of session pt ambulated  back to therapy gym with min guard using RW. Pt left sitting in w/c with all needs in reach.   Therapy Documentation Precautions:  Precautions Precautions: Fall Precaution Comments: ataxic  left side Restrictions Weight Bearing Restrictions: No General:   Vital Signs:   Pain: No c/o pain during therapy sessions.   Other Treatments:    See FIM for current functional status  Therapy/Group: Individual Therapy  [name] 07/24/2013, 12:10 PM

## 2013-07-24 NOTE — Progress Notes (Signed)
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 Presented 07/10/2013 with nausea vomiting and ataxia. Patient was independent prior to admission he does not drive. Cranial CT scan showed a large left acute cerebellar ischemic infarct with mass effect with effacement of the fourth ventricle as well as mild downward compression of the cerebellar tonsils. No signs of hydrocephalus. 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 CT angiogram of the head with no evidence of stenosis or occlusion. INR on admission of 0.96  Subjective/Complaints:  Discussed D/C date with pt as well as sending records to PCP  Review of Systems - Negative except Poor coordination of left side   Objective: Vital Signs: Blood pressure 113/83, pulse 98, temperature 98.3 F (36.8 C), temperature source Oral, resp. rate 18, height 6' (1.829 m), weight 79.47 kg (175 lb 3.2 oz), SpO2 100.00%. No results found. Results for orders placed during the hospital encounter of 07/16/13 (from the past 72 hour(s))  PROTIME-INR     Status: Abnormal   Collection Time    07/22/13  6:25 AM      Result Value Range   Prothrombin Time 25.4 (*) 11.6 - 15.2 seconds   INR 2.40 (*) 0.00 - 1.49  PROTIME-INR     Status: Abnormal   Collection Time    07/23/13  4:30 AM      Result Value Range   Prothrombin Time 30.4 (*) 11.6 - 15.2 seconds   INR 3.04 (*) 0.00 - 1.49  PROTIME-INR     Status: Abnormal   Collection Time    07/24/13  5:15 AM      Result Value Range   Prothrombin Time 36.6 (*) 11.6 - 15.2 seconds   INR 3.88 (*) 0.00 - 1.49     HEENT: normal Cardio: RRR and No murmurs, valve click Resp: CTA B/L and Unlabored GI: BS positive and Nondistended Extremity:  Pulses positive and No Edema Skin:   Intact Neuro: Alert/Oriented, Cranial Nerve II-XII normal, Normal Sensory, Abnormal Motor /5 in the left deltoid, bicep, tricep, grip, hip flexor, knee  extensor ankle dorsiflexor plantar flexor 5/5 on the right side. and Abnormal FMC Ataxic/ dec FMC Moderately severe dysmetria LUE and LLE Musc/Skel:  Normal General no acute distress   Assessment/Plan: 1. Functional deficits secondary to  left cerebellar embolic infarct, hemorrhagic conversion with left hemi ataxia  which require 3+ hours per day of interdisciplinary therapy in a comprehensive inpatient rehab setting. Physiatrist is providing close team supervision and 24 hour management of active medical problems listed below. Physiatrist and rehab team continue to assess barriers to discharge/monitor patient progress toward functional and medical goals. FIM: FIM - Bathing Bathing Steps Patient Completed: Right Arm;Left Arm;Abdomen;Front perineal area;Buttocks;Chest;Right upper leg;Left upper leg;Right lower leg (including foot);Left lower leg (including foot) Bathing: 5: Supervision: Safety issues/verbal cues  FIM - Upper Body Dressing/Undressing Upper body dressing/undressing steps patient completed: Thread/unthread right sleeve of pullover shirt/dresss;Pull shirt over trunk;Put head through opening of pull over shirt/dress;Thread/unthread left sleeve of pullover shirt/dress Upper body dressing/undressing: 5: Set-up assist to: Obtain clothing/put away FIM - Lower Body Dressing/Undressing Lower body dressing/undressing steps patient completed: Thread/unthread right underwear leg;Thread/unthread left underwear leg;Pull underwear up/down;Thread/unthread right pants leg;Thread/unthread left pants leg;Pull pants up/down;Fasten/unfasten pants;Don/Doff right sock;Don/Doff left sock Lower body dressing/undressing: 4: Steadying Assist  FIM - Toileting Toileting steps completed by patient: Adjust clothing prior to toileting;Performs perineal hygiene;Adjust clothing after toileting Toileting Assistive Devices: Grab  bar or rail for support Toileting: 6: More than reasonable amount of time  FIM -  Diplomatic Services operational officer Devices: Grab bars;Walker Toilet Transfers: 5-To toilet/BSC: Supervision (verbal cues/safety issues);5-From toilet/BSC: Supervision (verbal cues/safety issues)  FIM - Press photographer Assistive Devices: Arm rests;Walker Bed/Chair Transfer: 0: Activity did not occur  FIM - Locomotion: Wheelchair Distance: 100 Locomotion: Wheelchair: 2: Travels 50 - 149 ft with minimal assistance (Pt.>75%) FIM - Locomotion: Ambulation Locomotion: Ambulation Assistive Devices: Designer, industrial/product Ambulation/Gait Assistance: 4: Min guard Locomotion: Ambulation: 4: Travels 150 ft or more with minimal assistance (Pt.>75%)  Comprehension Comprehension Mode: Auditory Comprehension: 5-Understands basic 90% of the time/requires cueing < 10% of the time  Expression Expression Mode: Verbal Expression: 5-Expresses basic needs/ideas: With no assist  Social Interaction Social Interaction: 6-Interacts appropriately with others with medication or extra time (anti-anxiety, antidepressant).  Problem Solving Problem Solving: 5-Solves basic problems: With no assist  Memory Memory: 5-Recognizes or recalls 90% of the time/requires cueing < 10% of the time  Medical Problem List and Plan:  1. Embolic left cerebellar infarct infarct occurred when INR was somewhat therapeutic. Neurology and neurosurgery aware of hemorrhagic conversion. No progressive neurologic symptoms to warrant discontinuation of anticoagulation at this time, INR>3.0 , goal is >2.5 monitor for neuro worsening, 2. DVT Prophylaxis/Anticoagulation: Chronic Coumadin therapy with history of aortic and mitral valve replacement. 3. Pain Management: Tylenol as needed  4. Neuropsych: This patient is capable of making decisions on his own behalf.  5. History of alcohol, marijuana and tobacco abuse. Provide full counseling   LOS (Days) 8 A FACE TO FACE EVALUATION WAS PERFORMED  Jeff Stella  [name] 07/24/2013, 7:22 AM

## 2013-07-24 NOTE — Progress Notes (Signed)
Physical Therapy Weekly Progress Note  Patient Details  Name: Jeff [name] MRN: [id number] Date of Birth: 1955/10/30  Today's Date: 07/24/2013 Time: 4098-1191 Time Calculation (min): 55 min  Patient has met 2 of 2 short term goals. Patient consistently performs gait with rolling walker requiring min guard to min A for stability/balance.  Pt requires supervision to min A with dynamic standing balance activities.  Patient continues to demonstrate the following deficits: decreased LUE/LLE coordination, ataxic gait pattern, decreased endurance, and therefore will continue to benefit from skilled PT intervention to enhance overall performance with activity tolerance, balance, postural control, ability to compensate for deficits, functional use of  left upper extremity and left lower extremity, attention, awareness and coordination.  Patient progressing toward long term goals..  Continue plan of care.  PT Short Term Goals Week 1:  PT Short Term Goal 1 (Week 1): Pt will gait with min A in controlled environment min A PT Short Term Goal 1 - Progress (Week 1): Met PT Short Term Goal 2 (Week 1): Pt will demo dynamic standing balance with min A during functional activity PT Short Term Goal 2 - Progress (Week 1): Met Week 2:  PT Short Term Goal 1 (Week 2): STG's=LTG's secondary to ELOS  Skilled Therapeutic Interventions/Progress Updates:    Pt received semi-reclined in bed; agreeable to session. Gait x150' with rolling walker and min guard. Pt continues to demonstrate increased lateral trunk displacement with direction change (L>R) while ambulating. During gait, verbal cueing for redirection due to distraction by surroundings required x1 this session. Once in gym, performed static/dynamic standing balance activities on Biodex; see below for detailed description. Performed sit<>stands x6 reps from lowered mat table to rolling walker with verbal cues and demonstration for setup, technique; min A for initial  4 transfers, min guard for subsequent transfer, supervision for final transfer. Therapist departed treatment gym with pt seated EOM awaiting start of next PT session.  Therapy Documentation Precautions:  Precautions Precautions: Fall Precaution Comments: ataxic left side Restrictions Weight Bearing Restrictions: No Pain:  Pt reports no pain during session. Locomotion : Ambulation Ambulation/Gait Assistance: 4: Min guard  Balance: Dynamic Standing Balance Dynamic Standing - Balance Support: No upper extremity supported Dynamic Standing - Level of Assistance: 5: Stand by assistance;4: Min assist Dynamic Standing - Balance Activities: Lateral lean/weight shifting;Forward lean/weight shifting Dynamic Standing - Comments: Performed the following static/dynamic standing balance activities on Biodex Balance System: random control training x4 min (52% accuracy), limits of stability x3 min (20% accuracy), weight shifting x1 min (51% accuracy), and postural  stability with AP/PA perturbations x3 min. L ankle strategy delayed with AP perturbations.  See FIM for current functional status  Therapy/Group: Individual Therapy  Hobble, Lorenda Ishihara 07/24/2013, 6:20 PM

## 2013-07-24 NOTE — Progress Notes (Signed)
ANTICOAGULATION CONSULT NOTE - Follow Up Consult  Pharmacy Consult for coumadin Indication: mechanical AVR/MVR  No Known Allergies  Patient Measurements: Height: 6' (182.9 cm) Weight: 175 lb 3.2 oz (79.47 kg) IBW/kg (Calculated) : 77.6   Vital Signs: Temp: 98.3 F (36.8 C) (12/11 0617) Temp src: Oral (12/11 0617) BP: 113/83 mmHg (12/11 0617) Pulse Rate: 98 (12/11 0617)  Labs:  Recent Labs  07/22/13 0625 07/23/13 0430 07/24/13 0515  LABPROT 25.4* 30.4* 36.6*  INR 2.40* 3.04* 3.88*    Estimated Creatinine Clearance: 85.6 ml/min (by C-G formula based on Cr of 1.02).   Medications:  Scheduled:  . antiseptic oral rinse  15 mL Mouth Rinse BID  . hydrochlorothiazide  12.5 mg Oral Daily  . Warfarin - Pharmacist Dosing Inpatient   Does not apply q1800   Infusions:  . sodium chloride 1,000 mL (07/17/13 2058)    Assessment: 57 yo male on chronic coumadin for h/o mechanical AVR/MVR and CVA (02/2011) and admitted 11/27 for recurrent stroke while INR was subtherapeutic. INR 3.04 > 3.88 slightly supratherapeutic and a big increase from yesterday. No bleeding noted per chart.   Goal of Therapy:  INR 2.5-3.5 Monitor platelets by anticoagulation protocol: Yes   Plan:  Hold coumadin today Monitor for signs of bleeding Daily PT/INR  Bayard Hugger, PharmD, BCPS  Clinical Pharmacist  Pager: [phone]   07/24/2013,1:52 PM

## 2013-07-25 ENCOUNTER — Inpatient Hospital Stay (HOSPITAL_COMMUNITY): Payer: Medicaid Other

## 2013-07-25 ENCOUNTER — Inpatient Hospital Stay (HOSPITAL_COMMUNITY): Payer: Medicaid Other | Admitting: Physical Therapy

## 2013-07-25 LAB — PROTIME-INR
INR: 2.68 — ABNORMAL HIGH (ref 0.00–1.49)
Prothrombin Time: 27.6 seconds — ABNORMAL HIGH (ref 11.6–15.2)

## 2013-07-25 MED ORDER — WARFARIN SODIUM 2.5 MG PO TABS
12.5000 mg | ORAL_TABLET | Freq: Once | ORAL | Status: AC
  Administered 2013-07-25: 18:00:00 12.5 mg via ORAL
  Filled 2013-07-25: qty 1

## 2013-07-25 NOTE — Progress Notes (Signed)
Physical Therapy Session Note  Patient Details  Name: Jeff [name] MRN: [id number] Date of Birth: 02-28-1956  Today's Date: 07/25/2013 Time: [phone] and 1324-4010 Time Calculation (min): 60 min and 30 min Short Term Goals: Week 2:  PT Short Term Goal 1 (Week 2): STG's=LTG's secondary to ELOS  Skilled Therapeutic Interventions/Progress Updates:    Treatment Session 1: Pt received supine in bed; agreeable to therapy. Session focused increasing pt safety and independence with functional transfers and gait in home setting over. Pt reports that the majority of home floor is tile; front porch is concrete. Seated EOB, pt donned pants and bilat shoes with mod I, intermittent use of bed rail for UE support. Performed w/c mobility x200' total with bilat UE's with to increase functional endurance, minA required for turning. Supine>sit with no assist, no rails, and HOB flat. Performed gait 2x150' in home environment (over wood, carpet, and tile flooring) with rolling walker with supervision to min guard (with turning and when transitioning from one surface to another). In community setting (food court at hospital entrance), performed multiple stand-pivot transfers to/from different surfaces with rolling walker and supervision. Negotiation of single 7.5" step with rolling walker and min A. Negotiation of 5.5" step with rolling walker requiring min guard and verbal cueing for sequencing for first trial; supervision and effective carryover of sequencing during second trial. The following education was provided during seated rest breaks: interactive discussion of safety precuations to take in home (for example, stabilizing lightweight chair against wall prior to attempting to sit); discussion of modifiable risk factors for stroke. Session ended in pt room, where pt was left seated in w/c with all needs within reach.  Treatment Session 2:  Pt received seated in w/c; agreeable to therapy. Session focused on  assessing/addressing pt independence with gait over varying surfaces in a busy community setting. While seated in w/c, pt donned coat with mod I, increased time.Pt ambulated >150' in controlled environment with rolling walker and min guard. Multiple sit<>stands from w/c to rolling walker with close supervision. Transported pt in w/c to hospital lobby, where pt performed gait 2x150' in home environment (over multiple types of surfaces as described above) with rolling walker and min guard to min A (over changes in type of floor/surface). Also performed gait x100' over concrete/asphault (to simulate pt's front porch) with rolling walker and minA for stability/balance. Stand-pivot from w/c<>sofa with rolling walker and close supervision. Transported pt in w/c to unit due to pt fatigue. Session ended in pt room, where pt was left seated in w/c with all needs within reach.  Therapy Documentation Precautions:  Precautions Precautions: Fall Precaution Comments: ataxic left side Restrictions Weight Bearing Restrictions: No Pain: Pain Assessment Pain Assessment: No/denies pain Locomotion : Ambulation Ambulation/Gait Assistance: 4: Min guard Wheelchair Mobility Distance: 200   See FIM for current functional status  Therapy/Group: Individual Therapy  Calvert Cantor 07/25/2013, 12:34 PM

## 2013-07-25 NOTE — Progress Notes (Signed)
Occupational Therapy Session Note  Patient Details  Name: Jeff [name] MRN: [id number] Date of Birth: 27-Oct-1955  Today's Date: 07/25/2013 Time: 4098-1191 and 4782-9562 Time Calculation (min): 60 min and 42 min   Short Term Goals: Week 2:  OT Short Term Goal 1 (Week 2): Focus on LTGs  Skilled Therapeutic Interventions/Progress Updates:    Session 1: Pt seen for ADL retraining with focus on functional transfers (specifically to left), dynamic standing balance, and safety awareness. Retrieved clothing items and transported to ADL apartment with min cues and supervision when ambulating with RW. Completed bathing and dressing with supervision and increased safety awareness during sit<>stand and carryover of positioning of BUE and BLE prior to transfers. Pt with no LOB during dynamic standing with clothing management. Ambulated back to room and completed grooming tasks in standing with pt using LUE at nondominant level. From w/c pt engaged in HEP using theraband with mod cues to follow handout. Emphasis on strengthening LUE during HEP. Pt left sitting in w/c with all needs in reach.  Session 2: Therapy session focused on NMR to LUE/LLE, dynamic balance, and functional transfers. Pt received sitting in w/c and ambulated to therapy gym with supervision using RW. Engaged in clothes pins task while in quadruped to emphasize weight bearing through LLE/LUE. Pt placed clothes pins on target with RUE then removed with LUE. Completed 2 sets 15 reps of push ups from knees with min cues for bending at elbows to facilitate weight bearing. Engaged in obstacle course focusing on balance during turn changes as pt often has LOB during these (L>R). Pt with SBA throughout and no LOB. Pt completed medium peg board game with focus on coordination and in-hand manioulation with LUE. Pt picking up 2 pegs at a time and placing individually dropping approx 40% of time. This was improvement from last week as pt unable to translate  any pegs from palm>fingers without dropping. Pt required cues for breaks as his LUE became more ataxic with fatigue. At end of session pt returned to room and completed toilet task with supervision in standing. Pt left sitting in w/c with all needs in reach.   Therapy Documentation Precautions:  Precautions Precautions: Fall Precaution Comments: ataxic left side Restrictions Weight Bearing Restrictions: No General:   Vital Signs:   Pain: No reports of pain during therapy sessions.   See FIM for current functional status  Therapy/Group: Individual Therapy  [name] 07/25/2013, 12:25 PM

## 2013-07-25 NOTE — Progress Notes (Signed)
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 Presented 07/10/2013 with nausea vomiting and ataxia. Patient was independent prior to admission he does not drive. Cranial CT scan showed a large left acute cerebellar ischemic infarct with mass effect with effacement of the fourth ventricle as well as mild downward compression of the cerebellar tonsils. No signs of hydrocephalus. 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 CT angiogram of the head with no evidence of stenosis or occlusion. INR on admission of 0.96  Subjective/Complaints:  Discussed D/C date 12/16, pt working toward goals, no new med issues, INR creeping up but no evidence of bleeding, no decline in fxn  Review of Systems - Negative except Poor coordination of left side   Objective: Vital Signs: Blood pressure 143/93, pulse 93, temperature 98.2 F (36.8 C), temperature source Oral, resp. rate 16, height 6' (1.829 m), weight 79.47 kg (175 lb 3.2 oz), SpO2 100.00%. No results found. Results for orders placed during the hospital encounter of 07/16/13 (from the past 72 hour(s))  PROTIME-INR     Status: Abnormal   Collection Time    07/23/13  4:30 AM      Result Value Range   Prothrombin Time 30.4 (*) 11.6 - 15.2 seconds   INR 3.04 (*) 0.00 - 1.49  PROTIME-INR     Status: Abnormal   Collection Time    07/24/13  5:15 AM      Result Value Range   Prothrombin Time 36.6 (*) 11.6 - 15.2 seconds   INR 3.88 (*) 0.00 - 1.49     HEENT: normal Cardio: RRR and No murmurs, valve click Resp: CTA B/L and Unlabored GI: BS positive and Nondistended Extremity:  Pulses positive and No Edema Skin:   Intact Neuro: Alert/Oriented, Cranial Nerve II-XII normal, Normal Sensory, Abnormal Motor /5 in the left deltoid, bicep, tricep, grip, hip flexor, knee extensor ankle dorsiflexor plantar flexor 5/5 on the right side. and Abnormal FMC Ataxic/ dec  FMC Moderately severe dysmetria LUE and LLE Musc/Skel:  Normal General no acute distress   Assessment/Plan: 1. Functional deficits secondary to  left cerebellar embolic infarct, hemorrhagic conversion with left hemi ataxia  which require 3+ hours per day of interdisciplinary therapy in a comprehensive inpatient rehab setting. Physiatrist is providing close team supervision and 24 hour management of active medical problems listed below. Physiatrist and rehab team continue to assess barriers to discharge/monitor patient progress toward functional and medical goals. FIM: FIM - Bathing Bathing Steps Patient Completed: Right Arm;Left Arm;Abdomen;Front perineal area;Buttocks;Chest;Right upper leg;Left upper leg;Right lower leg (including foot);Left lower leg (including foot) Bathing: 4: Steadying assist  FIM - Upper Body Dressing/Undressing Upper body dressing/undressing steps patient completed: Thread/unthread right sleeve of pullover shirt/dresss;Pull shirt over trunk;Put head through opening of pull over shirt/dress;Thread/unthread left sleeve of pullover shirt/dress Upper body dressing/undressing: 5: Set-up assist to: Obtain clothing/put away FIM - Lower Body Dressing/Undressing Lower body dressing/undressing steps patient completed: Thread/unthread right underwear leg;Thread/unthread left underwear leg;Pull underwear up/down;Thread/unthread right pants leg;Thread/unthread left pants leg;Pull pants up/down;Fasten/unfasten pants;Don/Doff right sock;Don/Doff left sock Lower body dressing/undressing: 4: Steadying Assist  FIM - Toileting Toileting steps completed by patient: Adjust clothing prior to toileting;Performs perineal hygiene;Adjust clothing after toileting Toileting Assistive Devices: Grab bar or rail for support Toileting: 6: More than reasonable amount of time  FIM - Diplomatic Services operational officer Devices: Walker;Grab bars Toilet Transfers: 5-From toilet/BSC:  Supervision (verbal cues/safety issues);5-To toilet/BSC: Supervision (  verbal cues/safety issues)  FIM - Bed/Chair Transfer Bed/Chair Transfer Assistive Devices: Arm rests;Walker Bed/Chair Transfer: 4: Bed > Chair or W/C: Min A (steadying Pt. > 75%);4: Chair or W/C > Bed: Min A (steadying Pt. > 75%)  FIM - Locomotion: Wheelchair Distance: 100 Locomotion: Wheelchair: 2: Travels 50 - 149 ft with minimal assistance (Pt.>75%) FIM - Locomotion: Ambulation Locomotion: Ambulation Assistive Devices: Designer, industrial/product Ambulation/Gait Assistance: 4: Min guard Locomotion: Ambulation: 4: Travels 150 ft or more with minimal assistance (Pt.>75%)  Comprehension Comprehension Mode: Auditory Comprehension: 6-Follows complex conversation/direction: With extra time/assistive device  Expression Expression Mode: Verbal Expression: 6-Expresses complex ideas: With extra time/assistive device  Social Interaction Social Interaction: 6-Interacts appropriately with others with medication or extra time (anti-anxiety, antidepressant).  Problem Solving Problem Solving: 6-Solves complex problems: With extra time  Memory Memory: 6-More than reasonable amt of time  Medical Problem List and Plan:  1. Embolic left cerebellar infarct infarct occurred when INR was somewhat therapeutic. Neurology and neurosurgery aware of hemorrhagic conversion. No progressive neurologic symptoms to warrant discontinuation of anticoagulation at this time, INR>3.0 , goal is >2.5 monitor for neuro worsening, 2. DVT Prophylaxis/Anticoagulation: Chronic Coumadin therapy with history of aortic and mitral valve replacement. 3. Pain Management: Tylenol as needed  4. Neuropsych: This patient is capable of making decisions on his own behalf.  5. History of alcohol, marijuana and tobacco abuse. Provide full counseling   LOS (Days) 9 A FACE TO FACE EVALUATION WAS PERFORMED  Anwen Cannedy E 07/25/2013, 7:19 AM

## 2013-07-25 NOTE — Progress Notes (Signed)
ANTICOAGULATION CONSULT NOTE - Follow Up Consult  Pharmacy Consult for coumadin Indication: mechanical AVR/MVR  No Known Allergies  Patient Measurements: Height: 6' (182.9 cm) Weight: 175 lb 3.2 oz (79.47 kg) IBW/kg (Calculated) : 77.6   Vital Signs: Temp: 98.2 F (36.8 C) (12/12 0544) Temp src: Oral (12/12 0544) BP: 143/93 mmHg (12/12 0544) Pulse Rate: 93 (12/12 0544)  Labs:  Recent Labs  07/23/13 0430 07/24/13 0515 07/25/13 0640  LABPROT 30.4* 36.6* 27.6*  INR 3.04* 3.88* 2.68*    Estimated Creatinine Clearance: 85.6 ml/min (by C-G formula based on Cr of 1.02).   Medications:  Scheduled:  . antiseptic oral rinse  15 mL Mouth Rinse BID  . hydrochlorothiazide  12.5 mg Oral Daily  . Warfarin - Pharmacist Dosing Inpatient   Does not apply q1800   Infusions:  . sodium chloride 1,000 mL (07/17/13 2058)    Assessment: 57 yo male on chronic coumadin for h/o mechanical AVR/MVR and CVA (02/2011) and admitted 11/27 for recurrent stroke while INR was subtherapeutic. Coumadin held 12/11 for supratherapeutic INR 3.88. INR this AM 2.68. CBC stable. No bleeding noted per chart.  INR 12/09 2.40 action: 15mg  12/10 3.04 action: 12.5 mg  12/11 3.88 action: hold 12/12 2.68  Goal of Therapy:  INR 2.5-3.5 Monitor platelets by anticoagulation protocol: Yes   Plan:  Will resume Coumadin 12.5 mg x 1 tonight Monitor for signs of bleeding Daily PT/INR  Britt Bottom B. Artelia Laroche, PharmD Clinical Pharmacist - Resident Pager: [phone] Phone: [phone] 07/25/2013 1:18 PM

## 2013-07-26 ENCOUNTER — Inpatient Hospital Stay (HOSPITAL_COMMUNITY): Payer: Medicaid Other | Admitting: *Deleted

## 2013-07-26 MED ORDER — WARFARIN SODIUM 2.5 MG PO TABS
12.5000 mg | ORAL_TABLET | Freq: Once | ORAL | Status: AC
  Administered 2013-07-26: 12.5 mg via ORAL
  Filled 2013-07-26: qty 1

## 2013-07-26 NOTE — Progress Notes (Signed)
ANTICOAGULATION CONSULT NOTE - Follow Up Consult  Pharmacy Consult for coumadin Indication: mechanical AVR/MVR  No Known Allergies  Patient Measurements: Height: 6' (182.9 cm) Weight: 175 lb 3.2 oz (79.47 kg) IBW/kg (Calculated) : 77.6   Vital Signs: Temp: 98.2 F (36.8 C) (12/13 0515) Temp src: Oral (12/13 0515) BP: 124/81 mmHg (12/13 0515) Pulse Rate: 88 (12/13 0515)  Labs:  Recent Labs  07/24/13 0515 07/25/13 0640 07/26/13 0525  LABPROT 36.6* 27.6* 22.7*  INR 3.88* 2.68* 2.08*    Estimated Creatinine Clearance: 85.6 ml/min (by C-G formula based on Cr of 1.02).   Medications:  Scheduled:  . antiseptic oral rinse  15 mL Mouth Rinse BID  . hydrochlorothiazide  12.5 mg Oral Daily  . Warfarin - Pharmacist Dosing Inpatient   Does not apply q1800   Infusions:  . sodium chloride 1,000 mL (07/17/13 2058)    Assessment: 57 yo male on chronic coumadin for h/o mechanical AVR/MVR and CVA (02/2011) and admitted 11/27 for recurrent stroke while INR was subtherapeutic. Coumadin held 12/11 for supratherapeutic INR 3.88. Since INR typically is reflective of changes from 48 hours ago, the INR this morning decreased to 2.08 since no dose was given 48 hours ago. INR yesterday was 2.68 and warfarin 12.5mg  was given. No bleeding noted.  INR 12/09 2.40 action: 15mg  12/10 3.04 action: 12.5 mg  12/11 3.88 action: hold 12/12 2.68 action: 12.5mg  12/13 2.08   Goal of Therapy:  INR 2.5-3.5 Monitor platelets by anticoagulation protocol: Yes   Plan:  1. Warfarin 12.5 mg x 1 tonight 2. Monitor for signs of bleeding 3. Daily PT/INR  Brigido Mera D. Serita Degroote, PharmD, BCPS Clinical Pharmacist Pager: [phone] 07/26/2013 12:59 PM

## 2013-07-26 NOTE — Progress Notes (Signed)
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 Presented 07/10/2013 with nausea vomiting and ataxia. Patient was independent prior to admission he does not drive. Cranial CT scan showed a large left acute cerebellar ischemic infarct with mass effect with effacement of the fourth ventricle as well as mild downward compression of the cerebellar tonsils. No signs of hydrocephalus. 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 CT angiogram of the head with no evidence of stenosis or occlusion. INR on admission of 0.96  Subjective/Complaints:  Discussed D/C MD f/u with PCP No new issues overnite INR now below goal Review of Systems - Negative except Poor coordination of left side   Objective: Vital Signs: Blood pressure 124/81, pulse 88, temperature 98.2 F (36.8 C), temperature source Oral, resp. rate 17, height 6' (1.829 m), weight 79.47 kg (175 lb 3.2 oz), SpO2 98.00%. No results found. Results for orders placed during the hospital encounter of 07/16/13 (from the past 72 hour(s))  PROTIME-INR     Status: Abnormal   Collection Time    07/24/13  5:15 AM      Result Value Range   Prothrombin Time 36.6 (*) 11.6 - 15.2 seconds   INR 3.88 (*) 0.00 - 1.49  PROTIME-INR     Status: Abnormal   Collection Time    07/25/13  6:40 AM      Result Value Range   Prothrombin Time 27.6 (*) 11.6 - 15.2 seconds   INR 2.68 (*) 0.00 - 1.49  PROTIME-INR     Status: Abnormal   Collection Time    07/26/13  5:25 AM      Result Value Range   Prothrombin Time 22.7 (*) 11.6 - 15.2 seconds   INR 2.08 (*) 0.00 - 1.49     HEENT: normal Cardio: RRR and No murmurs, valve click Resp: CTA B/L and Unlabored GI: BS positive and Nondistended Extremity:  Pulses positive and No Edema Skin:   Intact Neuro: Alert/Oriented, Cranial Nerve II-XII normal, Normal Sensory, Abnormal Motor /5 in the left deltoid, bicep, tricep, grip, hip  flexor, knee extensor ankle dorsiflexor plantar flexor 5/5 on the right side. and Abnormal FMC Ataxic/ dec FMC Moderately severe dysmetria LUE and LLE Musc/Skel:  Normal General no acute distress   Assessment/Plan: 1. Functional deficits secondary to  left cerebellar embolic infarct, hemorrhagic conversion with left hemi ataxia  which require 3+ hours per day of interdisciplinary therapy in a comprehensive inpatient rehab setting. Physiatrist is providing close team supervision and 24 hour management of active medical problems listed below. Physiatrist and rehab team continue to assess barriers to discharge/monitor patient progress toward functional and medical goals. FIM: FIM - Bathing Bathing Steps Patient Completed: Right Arm;Left Arm;Abdomen;Front perineal area;Buttocks;Chest;Right upper leg;Left upper leg;Right lower leg (including foot);Left lower leg (including foot) Bathing: 5: Supervision: Safety issues/verbal cues  FIM - Upper Body Dressing/Undressing Upper body dressing/undressing steps patient completed: Thread/unthread right sleeve of pullover shirt/dresss;Pull shirt over trunk;Put head through opening of pull over shirt/dress;Thread/unthread left sleeve of pullover shirt/dress Upper body dressing/undressing: 5: Supervision: Safety issues/verbal cues FIM - Lower Body Dressing/Undressing Lower body dressing/undressing steps patient completed: Thread/unthread right underwear leg;Thread/unthread left underwear leg;Pull underwear up/down;Thread/unthread right pants leg;Thread/unthread left pants leg;Pull pants up/down;Fasten/unfasten pants;Don/Doff right sock;Don/Doff left sock Lower body dressing/undressing: 5: Supervision: Safety issues/verbal cues  FIM - Toileting Toileting steps completed by patient: Adjust clothing prior to toileting;Performs perineal hygiene;Adjust clothing after toileting Toileting Assistive Devices:  Grab bar or rail for support Toileting: 5: Supervision:  Safety issues/verbal cues  FIM - Diplomatic Services operational officer Devices: Art gallery manager Transfers: 5-From toilet/BSC: Supervision (verbal cues/safety issues);5-To toilet/BSC: Supervision (verbal cues/safety issues)  FIM - Press photographer Assistive Devices: Arm rests;Walker Bed/Chair Transfer: 5: Bed > Chair or W/C: Supervision (verbal cues/safety issues);5: Chair or W/C > Bed: Supervision (verbal cues/safety issues) (Close supervision)  FIM - Locomotion: Wheelchair Distance: 200 Locomotion: Wheelchair: 4: Travels 150 ft or more: maneuvers on rugs and over door sillls with minimal assistance (Pt.>75%) FIM - Locomotion: Ambulation Locomotion: Ambulation Assistive Devices: Designer, industrial/product Ambulation/Gait Assistance: 4: Min assist Locomotion: Ambulation: 4: Travels 150 ft or more with minimal assistance (Pt.>75%)  Comprehension Comprehension Mode: Auditory Comprehension: 6-Follows complex conversation/direction: With extra time/assistive device  Expression Expression Mode: Verbal Expression: 6-Expresses complex ideas: With extra time/assistive device  Social Interaction Social Interaction: 6-Interacts appropriately with others with medication or extra time (anti-anxiety, antidepressant).  Problem Solving Problem Solving: 6-Solves complex problems: With extra time  Memory Memory: 6-More than reasonable amt of time  Medical Problem List and Plan:  1. Embolic left cerebellar infarct infarct occurred when INR was somewhat therapeutic. Neurology and neurosurgery aware of hemorrhagic conversion. No progressive neurologic symptoms to warrant discontinuation of anticoagulation at this time, INR~2.0 , goal is >2.5 monitor for neuro worsening, 2. DVT Prophylaxis/Anticoagulation: Chronic Coumadin therapy with history of aortic and mitral valve replacement. 3. Pain Management: Tylenol as needed  4. Neuropsych: This patient is capable of making decisions on his  own behalf.  5. History of alcohol, marijuana and tobacco abuse. Provide full counseling   LOS (Days) 10 A FACE TO FACE EVALUATION WAS PERFORMED  Anabelle Bungert E 07/26/2013, 6:51 AM

## 2013-07-26 NOTE — Progress Notes (Signed)
Occupational Therapy Note  Patient Details  Name: Jeff [name] MRN: [id number] Date of Birth: Mar 24, 1956 Today's Date: 07/26/2013 Time:1540-1615  (35 min) Pain: none Individual session   Engaged in functional mobility, dyamic standing balance, weight bearing through Uppper and Lower extremities.  Ambulated with RW to gym with supervision.  Shot basketball in goal in standing with pt balancing to retrieve ball about 25%  of time with minimal assist.  Did quadraped on hands and knees with decreased stability on left side but could maintain for 15 sec.  Ambulated back to room and left with items within reach.  Jeff [name] 07/26/2013, 4:14 PM

## 2013-07-27 ENCOUNTER — Inpatient Hospital Stay (HOSPITAL_COMMUNITY): Payer: Medicaid Other | Admitting: *Deleted

## 2013-07-27 ENCOUNTER — Inpatient Hospital Stay (HOSPITAL_COMMUNITY): Payer: Medicaid Other | Admitting: Physical Therapy

## 2013-07-27 LAB — PROTIME-INR: Prothrombin Time: 23 seconds — ABNORMAL HIGH (ref 11.6–15.2)

## 2013-07-27 MED ORDER — WARFARIN SODIUM 2.5 MG PO TABS
12.5000 mg | ORAL_TABLET | Freq: Once | ORAL | Status: AC
  Administered 2013-07-27: 12.5 mg via ORAL
  Filled 2013-07-27: qty 1

## 2013-07-27 NOTE — Progress Notes (Signed)
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 Presented 07/10/2013 with nausea vomiting and ataxia. Patient was independent prior to admission he does not drive. Cranial CT scan showed a large left acute cerebellar ischemic infarct with mass effect with effacement of the fourth ventricle as well as mild downward compression of the cerebellar tonsils. No signs of hydrocephalus. 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 CT angiogram of the head with no evidence of stenosis or occlusion. INR on admission of 0.96  Subjective/Complaints:  Asking about D/C date No new issues overnite INR now below goal Review of Systems - Negative except Poor coordination of left side   Objective: Vital Signs: Blood pressure 141/99, pulse 92, temperature 98.2 F (36.8 C), temperature source Oral, resp. rate 18, height 6' (1.829 m), weight 79.47 kg (175 lb 3.2 oz), SpO2 97.00%. No results found. Results for orders placed during the hospital encounter of 07/16/13 (from the past 72 hour(s))  PROTIME-INR     Status: Abnormal   Collection Time    07/25/13  6:40 AM      Result Value Range   Prothrombin Time 27.6 (*) 11.6 - 15.2 seconds   INR 2.68 (*) 0.00 - 1.49  PROTIME-INR     Status: Abnormal   Collection Time    07/26/13  5:25 AM      Result Value Range   Prothrombin Time 22.7 (*) 11.6 - 15.2 seconds   INR 2.08 (*) 0.00 - 1.49  PROTIME-INR     Status: Abnormal   Collection Time    07/27/13  4:10 AM      Result Value Range   Prothrombin Time 23.0 (*) 11.6 - 15.2 seconds   INR 2.11 (*) 0.00 - 1.49     HEENT: normal Cardio: RRR and No murmurs, valve click Resp: CTA B/L and Unlabored GI: BS positive and Nondistended Extremity:  Pulses positive and No Edema Skin:   Intact Neuro: Alert/Oriented, Cranial Nerve II-XII normal, Normal Sensory, Abnormal Motor /5 in the left deltoid, bicep, tricep, grip, hip flexor,  knee extensor ankle dorsiflexor plantar flexor 5/5 on the right side. and Abnormal FMC Ataxic/ dec FMC Moderately severe dysmetria LUE and LLE Musc/Skel:  Normal General no acute distress   Assessment/Plan: 1. Functional deficits secondary to  left cerebellar embolic infarct, hemorrhagic conversion with left hemi ataxia  which require 3+ hours per day of interdisciplinary therapy in a comprehensive inpatient rehab setting. Physiatrist is providing close team supervision and 24 hour management of active medical problems listed below. Physiatrist and rehab team continue to assess barriers to discharge/monitor patient progress toward functional and medical goals. FIM: FIM - Bathing Bathing Steps Patient Completed: Right Arm;Left Arm;Abdomen;Front perineal area;Buttocks;Chest;Right upper leg;Left upper leg;Right lower leg (including foot);Left lower leg (including foot) Bathing: 5: Supervision: Safety issues/verbal cues  FIM - Upper Body Dressing/Undressing Upper body dressing/undressing steps patient completed: Thread/unthread right sleeve of pullover shirt/dresss;Pull shirt over trunk;Put head through opening of pull over shirt/dress;Thread/unthread left sleeve of pullover shirt/dress Upper body dressing/undressing: 5: Supervision: Safety issues/verbal cues FIM - Lower Body Dressing/Undressing Lower body dressing/undressing steps patient completed: Thread/unthread right underwear leg;Thread/unthread left underwear leg;Pull underwear up/down;Thread/unthread right pants leg;Thread/unthread left pants leg;Pull pants up/down;Fasten/unfasten pants;Don/Doff right sock;Don/Doff left sock Lower body dressing/undressing: 5: Supervision: Safety issues/verbal cues  FIM - Toileting Toileting steps completed by patient: Adjust clothing prior to toileting;Performs perineal hygiene;Adjust clothing after toileting Toileting Assistive Devices: Grab bar  or rail for support Toileting: 5: Supervision: Safety  issues/verbal cues  FIM - Diplomatic Services operational officer Devices: Art gallery manager Transfers: 5-To toilet/BSC: Supervision (verbal cues/safety issues)  FIM - Banker Devices: Arm rests;Walker Bed/Chair Transfer: 5: Bed > Chair or W/C: Supervision (verbal cues/safety issues);5: Chair or W/C > Bed: Supervision (verbal cues/safety issues) (Close supervision)  FIM - Locomotion: Wheelchair Distance: 200 Locomotion: Wheelchair: 4: Travels 150 ft or more: maneuvers on rugs and over door sillls with minimal assistance (Pt.>75%) FIM - Locomotion: Ambulation Locomotion: Ambulation Assistive Devices: Designer, industrial/product Ambulation/Gait Assistance: 4: Min assist Locomotion: Ambulation: 4: Travels 150 ft or more with minimal assistance (Pt.>75%)  Comprehension Comprehension Mode: Auditory Comprehension: 6-Follows complex conversation/direction: With extra time/assistive device  Expression Expression Mode: Verbal Expression: 6-Expresses complex ideas: With extra time/assistive device  Social Interaction Social Interaction: 6-Interacts appropriately with others with medication or extra time (anti-anxiety, antidepressant).  Problem Solving Problem Solving: 6-Solves complex problems: With extra time  Memory Memory: 6-More than reasonable amt of time  Medical Problem List and Plan:  1. Embolic left cerebellar infarct infarct occurred when INR was somewhat therapeutic. Neurology and neurosurgery aware of hemorrhagic conversion. No progressive neurologic symptoms to warrant discontinuation of anticoagulation at this time, INR~2.0 , goal is >2.5 monitor for neuro worsening, 2. DVT Prophylaxis/Anticoagulation: Chronic Coumadin therapy with history of aortic and mitral valve replacement. 3. Pain Management: Tylenol as needed  4. Neuropsych: This patient is capable of making decisions on his own behalf.  5. History of alcohol, marijuana and tobacco abuse.  Provide full counseling   LOS (Days) 11 A FACE TO FACE EVALUATION WAS PERFORMED  Vondell Babers E 07/27/2013, 6:56 AM

## 2013-07-27 NOTE — Progress Notes (Signed)
Physical Therapy Note  Patient Details  Name: Taryn Nave MRN: [id number] Date of Birth: [birth year] Today's Date: 07/27/2013  1540-1610 (30 minutes) individual Pain: no complaint of pain  Other: no complaint of dizziness Focus of treatment: Pt in bed upon arrival; supine to sit SBA; gait 120 feet X 2 , 200 feet X 1 RW min to close SBA; alternate stepping to small cones for accuracy forwards and sideways with RW support ; stepping backward with RW X 20 steps min to close SBA; ball toss to rebounder without UE support X 30 reps min assist for safety; returned to room with all needs within reach.    Alica Shellhammer,JIM 07/27/2013, 3:52 PM

## 2013-07-27 NOTE — Progress Notes (Signed)
Occupational Therapy Note   Patient Details  Name: Jeff [name] MRN: [id number] Date of Birth: 01/20/1956 Today's Date: 07/27/2013  Time:1145-1215 Pain: none Individual session  Engaged in theraband exercises for LUE.  Provided written handout.  Had patient review each exerercise to ensure understanding.  Needs reinforcement.     Humberto Seals 07/27/2013, 12:24 PM

## 2013-07-27 NOTE — Progress Notes (Signed)
ANTICOAGULATION CONSULT NOTE - Follow Up Consult  Pharmacy Consult for coumadin Indication: mechanical AVR/MVR  No Known Allergies  Patient Measurements: Height: 6' (182.9 cm) Weight: 175 lb 3.2 oz (79.47 kg) IBW/kg (Calculated) : 77.6   Vital Signs: Temp: 98.2 F (36.8 C) (12/14 0457) Temp src: Oral (12/14 0457) BP: 141/99 mmHg (12/14 0457) Pulse Rate: 92 (12/14 0457)  Labs:  Recent Labs  07/25/13 0640 07/26/13 0525 07/27/13 0410  LABPROT 27.6* 22.7* 23.0*  INR 2.68* 2.08* 2.11*    Estimated Creatinine Clearance: 85.6 ml/min (by C-G formula based on Cr of 1.02).   Medications:  Scheduled:  . antiseptic oral rinse  15 mL Mouth Rinse BID  . hydrochlorothiazide  12.5 mg Oral Daily  . 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 Coumadin held 12/11 for supratherapeutic INR 3.88. INR today increasing slightly. INR 12/09 2.40 action: 15mg  12/10 3.04 action: 12.5 mg  12/11 3.88 action: hold 12/12 2.68 action: 12.5mg  12/13 2.08 action: 12.5mg  12/14 2.11  Goal of Therapy:  INR 2.5-3.5 Monitor platelets by anticoagulation protocol: Yes   Plan:  1. Warfarin 12.5 mg x 1 tonight 2. Monitor for signs of bleeding 3. Daily PT/INR 4. CBC tomorrow  Lannis Lichtenwalner D. Telesia Ates, PharmD, BCPS Clinical Pharmacist Pager: [phone] 07/27/2013 11:38 AM

## 2013-07-28 ENCOUNTER — Inpatient Hospital Stay (HOSPITAL_COMMUNITY): Payer: Medicaid Other | Admitting: Physical Therapy

## 2013-07-28 ENCOUNTER — Inpatient Hospital Stay (HOSPITAL_COMMUNITY): Payer: Medicaid Other

## 2013-07-28 DIAGNOSIS — Z954 Presence of other heart-valve replacement: Secondary | ICD-10-CM

## 2013-07-28 DIAGNOSIS — I10 Essential (primary) hypertension: Secondary | ICD-10-CM

## 2013-07-28 DIAGNOSIS — I69993 Ataxia following unspecified cerebrovascular disease: Secondary | ICD-10-CM

## 2013-07-28 DIAGNOSIS — I6330 Cerebral infarction due to thrombosis of unspecified cerebral artery: Secondary | ICD-10-CM

## 2013-07-28 LAB — CBC
HCT: 39.2 % (ref 39.0–52.0)
Hemoglobin: 13.4 g/dL (ref 13.0–17.0)
MCV: 94.2 fL (ref 78.0–100.0)
RBC: 4.16 MIL/uL — ABNORMAL LOW (ref 4.22–5.81)
WBC: 6.1 10*3/uL (ref 4.0–10.5)

## 2013-07-28 LAB — PROTIME-INR: Prothrombin Time: 28 seconds — ABNORMAL HIGH (ref 11.6–15.2)

## 2013-07-28 MED ORDER — WARFARIN SODIUM 10 MG PO TABS
10.0000 mg | ORAL_TABLET | Freq: Once | ORAL | Status: AC
  Administered 2013-07-28: 10 mg via ORAL
  Filled 2013-07-28 (×2): qty 1

## 2013-07-28 NOTE — Progress Notes (Signed)
Social Work Patient ID: Jeff [name], male   DOB: [birth year], 57 y.o.   MRN: [id number] Pt feels ready for discharge tomorrow has met his goals and no family education needed.  His neighbor is coming to transport home tomorrow. DME to be delivered today and home health set up.

## 2013-07-28 NOTE — Progress Notes (Signed)
Physical Therapy Discharge Summary  Patient Details  Name: Jeff [name] MRN: [id number] Date of Birth: 1956-07-29  Today's Date: 07/28/2013 Time: 0900-1000 and 4098-1191 Time Calculation (min): 60 min and 42 min  Patient has met 10 of 10 long term goals due to improved activity tolerance, improved balance, improved postural control, ability to compensate for deficits, functional use of  left upper extremity and left lower extremity, improved attention, improved awareness and improved coordination.  Patient to discharge at an ambulatory level Modified Independent.   Patient's care partner is not necessary to provide cognitive and physical assistance, as patient is discharging at a modified independent level.  Reasons goals not met: N/A. All goals met or surpassed.  Recommendation:  Patient will benefit from ongoing skilled PT services in home health setting to continue to advance safe functional mobility, address ongoing impairments in safety and independence with functional mobility, and minimize fall risk.  Equipment: rolling walker  Reasons for discharge: treatment goals met and discharge from hospital  Patient/family agrees with progress made and goals achieved: Yes  Skilled Therapeutic Interventions/Progress Updates: Treatment Session 1: Pt received seated in w/c in room; agreeable to therapy session. Session focused on assessing/addressing pt independence/stability with functional mobility, transfers, stair negotiation, and floor transfers. Pt performed gait x200' in controlled and home (carpeted) environment with rolling walker and mod I. Performed floor transfer x2 with supervision using apt couch; no cueing required. Car transfers x2 trials with rolling walker; initial trial with supervision/cueing for hand placement, second trial with mod I. Negotiation of single curb (6.5" step to simulate primary entrance of pt's home) with rolling walker x2 trials with mod I. Negotiation of  standard curb with rolling walker and mod I. Negotiation of 12 steps with bilat hand rails, step-to pattern, independent; no cues required for sequencing. Self-propulsion of w/c x170' with supervision to negotiate door sills. Therapist departed with pt seated in w/c with all needs within reach.  Treatment Session 2: Pt received seated in w/c in room; agreeable to PT. Session focused on dynamic standing balance. Pt performed Berg Balance Scale, scoring 40/56. See below for detailed findings. Assessed functional transfers, bed mobility, and static and dynamic sitting/standing balance. See below for details. Gait >200' in controlled and home environments with rolling walker with mod I. Multiple w/c<>chair transfers with mod I using rolling walker. Therapist departed with patient seated in w/c with all needs within reach.  PT Discharge Precautions/Restrictions Precautions Precautions: None Restrictions Weight Bearing Restrictions: No Pain   Pt reports no pain during AM/PM sessions. Vision/Perception  Vision - History Baseline Vision: Wears glasses only for reading Patient Visual Report: No change from baseline Vision - Assessment Eye Alignment: Within Functional Limits Perception Perception: Within Functional Limits Praxis Praxis: Intact  Cognition Overall Cognitive Status: Within Functional Limits for tasks assessed Orientation Level: Oriented X4 Attention: Selective Selective Attention: Appears intact Memory: Appears intact Awareness: Appears intact Problem Solving: Appears intact Safety/Judgment: Appears intact Sensation Sensation Light Touch: Appears Intact Hot/Cold: Appears Intact Proprioception: Appears Intact Coordination Gross Motor Movements are Fluid and Coordinated: No Fine Motor Movements are Fluid and Coordinated: No Coordination and Movement Description: Ataxic LLE noted with alternating bilat toe-tapping exercise; however, coordination significantly improved since PT  evaluation Motor  Motor Motor: Ataxia Motor - Discharge Observations: Slightly narrow BOS with standing/walking; however, significantly improved since PT evaluation  Mobility Bed Mobility Bed Mobility: Rolling Right;Rolling Left;Supine to Sit;Sitting - Scoot to [place] of Bed;Scooting to [facility];Sit to Supine Rolling Left: 7: Independent Supine to  Sit: 7: Independent Sitting - Scoot to Edge of Bed: 7: Independent Sit to Supine: 7: Independent Scooting to HOB: 7: Independent Transfers Transfers: Yes Sit to Stand: 6: Modified independent (Device/Increase time) Sit to Stand Details (indicate cue type and reason): to rolling walker Stand to Sit: 6: Modified independent (Device/Increase time) Stand to Sit Details: to rolling walker Locomotion  Ambulation Ambulation: Yes Ambulation/Gait Assistance: 6: Modified independent (Device/Increase time) Ambulation Distance (Feet): 200 Feet Assistive device: Rolling walker Gait Gait: Yes Gait Pattern: Step-through pattern;Decreased stride length;Narrow base of support Gait velocity: WNL High Level Ambulation High Level Ambulation: Side stepping Side Stepping: lateral stepping in bilat directions without assustive device with supervision Stairs / Additional Locomotion Stairs: Yes Stairs Assistance: 7: Independent Stair Management Technique: Two rails;Step to pattern;Forwards Number of Stairs: 12 Height of Stairs: 6.5 Ramp: 6: Modified independent (Device) Curb: 6: Modified independent (Device/increase time) Corporate treasurer: Yes Wheelchair Assistance: 6: Modified independent (Device/Increase time) Occupational hygienist: Both upper extremities Wheelchair Parts Management: Supervision/cueing Distance: 170  Trunk/Postural Assessment  Cervical Assessment Cervical Assessment: Within Functional Limits Thoracic Assessment Thoracic Assessment: Within Functional Limits Lumbar Assessment Lumbar Assessment: Within Functional  Limits Postural Control Postural Control: Within Functional Limits  Balance Balance Balance Assessed: Yes Standardized Balance Assessment Standardized Balance Assessment: Berg Balance Test Berg Balance Test Sit to Stand: Able to stand without using hands and stabilize independently Standing Unsupported: Able to stand safely 2 minutes Sitting with Back Unsupported but Feet Supported on Floor or Stool: Able to sit safely and securely 2 minutes Stand to Sit: Sits safely with minimal use of hands Transfers: Able to transfer safely, definite need of hands Standing Unsupported with Eyes Closed: Able to stand 10 seconds with supervision Standing Ubsupported with Feet Together: Able to place feet together independently and stand for 1 minute with supervision From Standing, Reach Forward with Outstretched Arm: Can reach forward >12 cm safely (5") From Standing Position, Pick up Object from Floor: Able to pick up shoe, needs supervision From Standing Position, Turn to Look Behind Over each Shoulder: Looks behind one side only/other side shows less weight shift Turn 360 Degrees: Able to turn 360 degrees safely but slowly Standing Unsupported, Alternately Place Feet on Step/Stool: Able to complete >2 steps/needs minimal assist Standing Unsupported, One Foot in Front: Able to take small step independently and hold 30 seconds Standing on One Leg: Tries to lift leg/unable to hold 3 seconds but remains standing independently Total Score: 40 Static Sitting Balance Static Sitting - Balance Support: No upper extremity supported;Feet unsupported Static Sitting - Level of Assistance: 7: Independent Dynamic Sitting Balance Dynamic Sitting - Balance Support: Feet supported;No upper extremity supported Dynamic Sitting - Level of Assistance: 7: Independent Dynamic Sitting Balance - Compensations: Reaching (forward, laterally, and across midline) independently Static Standing Balance Static Standing - Balance  Support: No upper extremity supported Static Standing - Level of Assistance: 7: Independent Static Standing - Comment/# of Minutes: > 2 minutes without upper extremity support Dynamic Standing Balance Dynamic Standing - Balance Support: No upper extremity supported Dynamic Standing - Level of Assistance: 6: Modified independent (Device/Increase time) Dynamic Standing - Balance Activities: Lateral lean/weight shifting;Forward lean/weight shifting Dynamic Standing - Comments: Standing intermittent UE support at rolling walker: bilat UE reaching (anterior, across midline, and laterally), mod I. Extremity Assessment  RUE Assessment RUE Assessment: Within Functional Limits LUE Assessment LUE Assessment: Within Functional Limits (some ataxia) RLE Assessment RLE Assessment: Within Functional Limits LLE Assessment LLE Assessment: Within Functional Limits  See  FIM for current functional status  Hobble, Lorenda Ishihara 07/28/2013, 3:43 PM

## 2013-07-28 NOTE — Progress Notes (Signed)
ANTICOAGULATION CONSULT NOTE - Follow Up Consult  Pharmacy Consult for coumadin Indication: AVR/MVR  No Known Allergies  Patient Measurements: Height: 6' (182.9 cm) Weight: 175 lb 3.2 oz (79.47 kg) IBW/kg (Calculated) : 77.6 Heparin Dosing Weight:   Vital Signs: Temp: 98.1 F (36.7 C) (12/15 0555) Temp src: Oral (12/15 0555) BP: 117/83 mmHg (12/15 0555) Pulse Rate: 96 (12/15 0555)  Labs:  Recent Labs  07/26/13 0525 07/27/13 0410 07/28/13 0350  HGB  --   --  13.4  HCT  --   --  39.2  PLT  --   --  436*  LABPROT 22.7* 23.0* 28.0*  INR 2.08* 2.11* 2.73*    Estimated Creatinine Clearance: 85.6 ml/min (by C-G formula based on Cr of 1.02).   Medications:  Scheduled:  . antiseptic oral rinse  15 mL Mouth Rinse BID  . hydrochlorothiazide  12.5 mg Oral Daily  . Warfarin - Pharmacist Dosing Inpatient   Does not apply q1800   Infusions:  . sodium chloride 1,000 mL (07/17/13 2058)    Assessment: 57 yo male with hx of mechanical AVR/MVR is currently on therapeutic coumadin, but INR jumped from 2.11 to 2.73.   Goal of Therapy:  INR 2.5-3.5 Monitor platelets by anticoagulation protocol: Yes   Plan:  -warfarin 10 mg x 1 tonight -Monitor for signs of bleeding -Daily PT/INR -Coumadin education completed   Shauntee Karp, Tsz-Yin 07/28/2013,8:18 AM

## 2013-07-28 NOTE — Progress Notes (Signed)
Occupational Therapy Discharge Summary  Patient Details  Name: Jeff [name] MRN: [id number] Date of Birth: [birth year]  Today's Date: 07/28/2013 Time: 1030-1130 and 4098-1191 Time Calculation (min): 60 min and 30 min   Patient has met 9 of 9 long term goals due to improved activity tolerance, improved balance, postural control, ability to compensate for deficits, functional use of  LEFT upper and LEFT lower extremity, improved attention, improved awareness and improved coordination.  Patient to discharge at overall Modified Independent level.  Patient's care partner not necessary to provide the necessary physical and cognitive assistance at discharge.    Reasons goals not met: N/A. All LTGs met.  Recommendation:  Patient will benefit from ongoing skilled OT services in home health setting to continue to advance functional skills in the area of iADL and coordination, dynamic balance, functional use of LUE/LLE.  Equipment: TTB  Reasons for discharge: treatment goals met and discharge from hospital  Patient/family agrees with progress made and goals achieved: Yes  Skilled Therapeutic Intervention Session 1: Pt completed all self-care tasks and functional transfers at Mod I using RW during ambulation. Pt with improved safety awareness requiring no cues and no LOB noted during session. Therapist made pt Mod I in room/controlled environment. Practice simple meal task using microwave at Mod I and house keeping task of laundry at Mod I. Discussed safety at home. Pt with no questions or concerns about discharge at this time and reports his neighbor will be consistently checking on him throughout the day.  Session 2: Therapy session focused on NMR to LUE via strengthening using theraband. Completed HEP using handout without cues. Complete 7 exercises at 10 reps x2 sets. Engaged in dynamic balance activity while facilitating weight bearing through LLE via tapping cones laterally and anteriorly with min  assist for balance and alternating feet. At end of session pt left sitting in w/c with all needs in reach.   OT Discharge Precautions/Restrictions  Precautions Precautions: None Restrictions Weight Bearing Restrictions: No General   Vital Signs   Pain  No report of pain during therapy sessions. ADL   Vision/Perception  Vision - History Baseline Vision: Wears glasses only for reading Patient Visual Report: No change from baseline Vision - Assessment Eye Alignment: Within Functional Limits Perception Perception: Within Functional Limits Praxis Praxis: Intact  Cognition Overall Cognitive Status: Within Functional Limits for tasks assessed Orientation Level: Oriented X4 Attention: Selective Selective Attention: Appears intact Memory: Appears intact Awareness: Appears intact Problem Solving: Appears intact Safety/Judgment: Appears intact Sensation Sensation Light Touch: Appears Intact Hot/Cold: Appears Intact Proprioception: Appears Intact Coordination Gross Motor Movements are Fluid and Coordinated: No Fine Motor Movements are Fluid and Coordinated: No Coordination and Movement Description: LUE ataxic however much improvement Motor    Mobility     Trunk/Postural Assessment     Balance   Extremity/Trunk Assessment RUE Assessment RUE Assessment: Within Functional Limits LUE Assessment LUE Assessment: Within Functional Limits (some ataxia)  See FIM for current functional status  Kierstan Auer N 07/28/2013, 1:16 PM

## 2013-07-28 NOTE — Progress Notes (Signed)
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 Presented 07/10/2013 with nausea vomiting and ataxia. Patient was independent prior to admission he does not drive. Cranial CT scan showed a large left acute cerebellar ischemic infarct with mass effect with effacement of the fourth ventricle as well as mild downward compression of the cerebellar tonsils. No signs of hydrocephalus. 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 CT angiogram of the head with no evidence of stenosis or occlusion. INR on admission of 0.96  Subjective/Complaints:   Looking fwd to d.c. In am No c/os Good therapy sessions Review of Systems - Negative except Poor coordination of left side   Objective: Vital Signs: Blood pressure 117/83, pulse 96, temperature 98.1 F (36.7 C), temperature source Oral, resp. rate 18, height 6' (1.829 m), weight 79.47 kg (175 lb 3.2 oz), SpO2 95.00%. No results found. Results for orders placed during the hospital encounter of 07/16/13 (from the past 72 hour(s))  PROTIME-INR     Status: Abnormal   Collection Time    07/25/13  6:40 AM      Result Value Range   Prothrombin Time 27.6 (*) 11.6 - 15.2 seconds   INR 2.68 (*) 0.00 - 1.49  PROTIME-INR     Status: Abnormal   Collection Time    07/26/13  5:25 AM      Result Value Range   Prothrombin Time 22.7 (*) 11.6 - 15.2 seconds   INR 2.08 (*) 0.00 - 1.49  PROTIME-INR     Status: Abnormal   Collection Time    07/27/13  4:10 AM      Result Value Range   Prothrombin Time 23.0 (*) 11.6 - 15.2 seconds   INR 2.11 (*) 0.00 - 1.49  PROTIME-INR     Status: Abnormal   Collection Time    07/28/13  3:50 AM      Result Value Range   Prothrombin Time 28.0 (*) 11.6 - 15.2 seconds   INR 2.73 (*) 0.00 - 1.49  CBC     Status: Abnormal   Collection Time    07/28/13  3:50 AM      Result Value Range   WBC 6.1  4.0 - 10.5 K/uL   RBC 4.16 (*) 4.22 - 5.81  MIL/uL   Hemoglobin 13.4  13.0 - 17.0 g/dL   HCT 16.1  09.6 - 04.5 %   MCV 94.2  78.0 - 100.0 fL   MCH 32.2  26.0 - 34.0 pg   MCHC 34.2  30.0 - 36.0 g/dL   RDW 40.9  81.1 - 91.4 %   Platelets 436 (*) 150 - 400 K/uL     HEENT: normal Cardio: RRR and No murmurs, valve click Resp: CTA B/L and Unlabored GI: BS positive and Nondistended Extremity:  Pulses positive and No Edema Skin:   Intact Neuro: Alert/Oriented, Cranial Nerve II-XII normal, Normal Sensory, Abnormal Motor /5 in the left deltoid, bicep, tricep, grip, hip flexor, knee extensor ankle dorsiflexor plantar flexor 5/5 on the right side. and Abnormal FMC Ataxic/ dec FMC Moderately severe dysmetria LUE and LLE Musc/Skel:  Normal General no acute distress   Assessment/Plan: 1. Functional deficits secondary to  left cerebellar embolic infarct, hemorrhagic conversion with left hemi ataxia  which require 3+ hours per day of interdisciplinary therapy in a comprehensive inpatient rehab setting. Physiatrist is providing close team supervision and 24 hour management of active medical problems listed below. Physiatrist  and rehab team continue to assess barriers to discharge/monitor patient progress toward functional and medical goals. FIM: FIM - Bathing Bathing Steps Patient Completed: Chest;Right upper leg;Right Arm;Left upper leg;Abdomen;Right lower leg (including foot);Left Arm;Left lower leg (including foot);Front perineal area;Buttocks Bathing: 5: Supervision: Safety issues/verbal cues  FIM - Upper Body Dressing/Undressing Upper body dressing/undressing steps patient completed: Thread/unthread right sleeve of pullover shirt/dresss;Thread/unthread left sleeve of pullover shirt/dress;Put head through opening of pull over shirt/dress;Pull shirt over trunk Upper body dressing/undressing: 5: Supervision: Safety issues/verbal cues FIM - Lower Body Dressing/Undressing Lower body dressing/undressing steps patient completed: Thread/unthread  right underwear leg;Thread/unthread left underwear leg;Pull underwear up/down;Thread/unthread right pants leg;Thread/unthread left pants leg;Pull pants up/down;Don/Doff right sock;Don/Doff left sock Lower body dressing/undressing: 5: Supervision: Safety issues/verbal cues  FIM - Toileting Toileting steps completed by patient: Performs perineal hygiene;Adjust clothing prior to toileting;Adjust clothing after toileting Toileting Assistive Devices: Grab bar or rail for support Toileting: 5: Supervision: Safety issues/verbal cues  FIM - Diplomatic Services operational officer Devices: Art gallery manager Transfers: 5-To toilet/BSC: Supervision (verbal cues/safety issues);5-From toilet/BSC: Supervision (verbal cues/safety issues)  FIM - Press photographer Assistive Devices: Arm rests;Walker Bed/Chair Transfer: 5: Bed > Chair or W/C: Supervision (verbal cues/safety issues);5: Chair or W/C > Bed: Supervision (verbal cues/safety issues)  FIM - Locomotion: Wheelchair Distance: 200 Locomotion: Wheelchair: 4: Travels 150 ft or more: maneuvers on rugs and over door sillls with minimal assistance (Pt.>75%) FIM - Locomotion: Ambulation Locomotion: Ambulation Assistive Devices: Designer, industrial/product Ambulation/Gait Assistance: 4: Min assist Locomotion: Ambulation: 4: Travels 150 ft or more with minimal assistance (Pt.>75%)  Comprehension Comprehension Mode: Auditory Comprehension: 6-Follows complex conversation/direction: With extra time/assistive device  Expression Expression Mode: Verbal Expression: 6-Expresses complex ideas: With extra time/assistive device  Social Interaction Social Interaction: 6-Interacts appropriately with others with medication or extra time (anti-anxiety, antidepressant).  Problem Solving Problem Solving: 6-Solves complex problems: With extra time  Memory Memory: 6-More than reasonable amt of time  Medical Problem List and Plan:  1. Embolic left  cerebellar infarct infarct occurred when INR was somewhat therapeutic. Neurology and neurosurgery aware of hemorrhagic conversion. No progressive neurologic symptoms to warrant discontinuation of anticoagulation at this time, INR~2.0 , goal is >2.5 monitor for neuro worsening, 2. DVT Prophylaxis/Anticoagulation: Chronic Coumadin therapy with history of aortic and mitral valve replacement. 3. Pain Management: Tylenol as needed  4. Neuropsych: This patient is capable of making decisions on his own behalf.  5. History of alcohol, marijuana and tobacco abuse. Provide full counseling   LOS (Days) 12 A FACE TO FACE EVALUATION WAS PERFORMED  KIRSTEINS,ANDREW E 07/28/2013, 6:06 AM

## 2013-07-28 NOTE — Plan of Care (Signed)
Problem: RH SAFETY Goal: RH STG ADHERE TO SAFETY PRECAUTIONS W/ASSISTANCE/DEVICE STG Adhere to Safety Precautions With Supervision  Outcome: Completed/Met Date Met:  07/28/13 Mod I in room

## 2013-07-29 MED ORDER — HYDROCHLOROTHIAZIDE 12.5 MG PO CAPS: 12.5000 mg | ORAL_CAPSULE | Freq: Every day | ORAL | Status: DC

## 2013-07-29 MED ORDER — WARFARIN SODIUM 10 MG PO TABS
10.0000 mg | ORAL_TABLET | Freq: Every day | ORAL | Status: DC
  Filled 2013-07-29: qty 1

## 2013-07-29 MED ORDER — WARFARIN SODIUM 2.5 MG PO TABS
12.5000 mg | ORAL_TABLET | Freq: Once | ORAL | Status: DC
  Filled 2013-07-29: qty 1

## 2013-07-29 MED ORDER — WARFARIN SODIUM 10 MG PO TABS: 10.0000 mg | ORAL_TABLET | Freq: Every day | ORAL | Status: DC

## 2013-07-29 NOTE — Progress Notes (Signed)
Coumadin per pharmacy  Anticoagulation: Coumadin for AVR/MVR (Goal = 2.5-3.5), embolic CVA (occurred when INR was SUB therapeutic on warf 5mg  daily pta, pt states was compliant with doses); Neuro & neurosurg aware of hemorrhagic conversion of cerebellar infarct. No bleeding noted per chart. INR has been fluctuating over past several days with large increases and decreases.  INR 12/09 2.40 action: 15mg  12/10 3.04 action: 12.5 mg  12/11 3.88 action: hold 12/12 2.68 action: warfarin 12.5 12/13 2.08 action: warfarin 12.5mg  12/14 2.11 action: 12.5 mg 12/15: 2.73 action: 10 mg 12/16: 2.43 action: 12.5 mg  Plan:  -warfarin 12.5 mg x 1 tonight (probably will need 12.5mg  most days and 10mg  few days of the week).  Will be discharged today per note -Monitor for signs of bleeding -Daily PT/INR if still here -Coumadin education completed

## 2013-07-29 NOTE — Discharge Summary (Signed)
NAMEPLEASANT, BENSINGER NO.:  [id number]  MEDICAL RECORD NO.:  [id number]  LOCATION:  4M06C                        FACILITY:  [facility]  PHYSICIAN:  Erick Colace, M.D.DATE OF BIRTH:  1956/05/31  DATE OF ADMISSION:  07/16/2013 DATE OF DISCHARGE:  07/29/2013                              DISCHARGE SUMMARY   DISCHARGE DIAGNOSES: 1. Embolic left cerebellar infarct. 2. Chronic Coumadin with history of aortic valve replacement. 3. History of alcohol, marijuana, tobacco abuse.  HISTORY OF PRESENT ILLNESS:  HISTORY OF PRESENT ILLNESS:  This is a 57 year old right-handed male with history of aortic and mitral valve replacement on chronic Coumadin therapy as well as CVA in July 2012.  He presented on July 10, 2013, with nausea, vomiting, as well as ataxia.  The patient was independent prior to admission.  He does not drive.  Cranial CT scan showed a large left acute cerebellar ischemic infarct with mass effect with effacement of the 4th ventricle as well as mild downward compression of the cerebellar tonsils.  No signs of hydrocephalus.  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  CT angiogram of the head with no evidence of stenosis or occlusion.  INR on admission of 0.96.  The patient did not receive tPA.  Urine drug screen was positive for marijuana.  Carotid Dopplers with no ICA stenosis.  Echocardiogram with ejection fraction of 60%, unchanged from prior studies.  The patient remained on chronic Coumadin as well as the addition of intravenous heparin until INR therapeutic.  He was tolerating a regular diet.  Physical and occupational therapy ongoing.  The patient was admitted for comprehensive rehab program.  PAST MEDICAL HISTORY:  See discharge diagnoses.  SOCIAL HISTORY:  Lives alone.  FUNCTIONAL HISTORY:  Prior to admission independent.  He does not drive.  FUNCTIONAL STATUS:  Upon admission to rehab services, moderate assist 120 feet with a  rolling walker.  PHYSICAL EXAMINATION:  VITAL SIGNS:  Blood pressure 154/97, pulse 74, temperature 98, respirations 15. GENERAL:  This was an alert male, oriented x3.  Well developed. EYES:  Pupils round and reactive to light. LUNGS:  Clear to auscultation. CARDIAC:  Rate controlled. ABDOMEN:  Soft, nontender.  Good bowel sounds. NEUROLOGIC:  The speech was mildly dysarthric, but fully intelligible.  REHABILITATION HOSPITAL COURSE:  The patient was admitted to inpatient rehab services with therapies initiated on a 3-hour daily basis consisting of physical therapy, occupational therapy, and rehabilitation nursing.  The following issues were addressed during the patient's rehabilitation stay.  Pertaining to Mr. Mauceri's embolic left cerebellar infarct, he remained on chronic Coumadin therapy.  No bleeding episodes. He would follow up with Dr. Augustine Radar [phone].  Blood pressure is well-controlled on low-dose hydrochlorothiazide.  There was no chest pain or shortness of breath.  The patient did have a history of alcohol, positive urine drug screen of marijuana.  He received full counseling in regard to cessation of any illicit drug products.  It was questionable if he would be compliant with these requests.  The patient received weekly collaborative interdisciplinary team conferences to discuss estimated length of stay, family teaching, and any barriers to discharge.  He was ambulating  200 feet with rolling walker, min assist to close standby assist, alternating steps to small cones for accuracy, forward and sideways.  Standby assist for activities of daily living. He was advised no driving.  He was discharged to home.  DISCHARGE MEDICATIONS:  Hydrochlorothiazide 12.5 mg p.o. daily, Coumadin 12.5 mg daily adjusted accordingly for an INR of 2.0 to 3.0.  DIET:  Regular.  SPECIAL INSTRUCTIONS:  The patient would follow up with Dr. Claudette Laws at the outpatient rehab  center as advised, Dr. Delia Heady, Neurology Service in 1 month, call for appointment, Dr. Augustine Radar, [phone].     Mariam Dollar, P.A.   ______________________________ Erick Colace, M.D.    DA/MEDQ  D:  07/28/2013  T:  07/29/2013  Job:  [id number]  cc:   Augustine Radar, MD Pramod P. Pearlean Brownie, MD Stefani Dama, M.D.

## 2013-07-29 NOTE — Progress Notes (Signed)
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 Presented 07/10/2013 with nausea vomiting and ataxia. Patient was independent prior to admission he does not drive. Cranial CT scan showed a large left acute cerebellar ischemic infarct with mass effect with effacement of the fourth ventricle as well as mild downward compression of the cerebellar tonsils. No signs of hydrocephalus. 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 CT angiogram of the head with no evidence of stenosis or occlusion. INR on admission of 0.96  Subjective/Complaints:   Brother coming to pick pt up No c/os  Review of Systems - Negative except Poor coordination of left side   Objective: Vital Signs: Blood pressure 120/88, pulse 101, temperature 98.6 F (37 C), temperature source Oral, resp. rate 18, height 6' (1.829 m), weight 79.47 kg (175 lb 3.2 oz), SpO2 96.00%. No results found. Results for orders placed during the hospital encounter of 07/16/13 (from the past 72 hour(s))  PROTIME-INR     Status: Abnormal   Collection Time    07/27/13  4:10 AM      Result Value Range   Prothrombin Time 23.0 (*) 11.6 - 15.2 seconds   INR 2.11 (*) 0.00 - 1.49  PROTIME-INR     Status: Abnormal   Collection Time    07/28/13  3:50 AM      Result Value Range   Prothrombin Time 28.0 (*) 11.6 - 15.2 seconds   INR 2.73 (*) 0.00 - 1.49  CBC     Status: Abnormal   Collection Time    07/28/13  3:50 AM      Result Value Range   WBC 6.1  4.0 - 10.5 K/uL   RBC 4.16 (*) 4.22 - 5.81 MIL/uL   Hemoglobin 13.4  13.0 - 17.0 g/dL   HCT 40.9  81.1 - 91.4 %   MCV 94.2  78.0 - 100.0 fL   MCH 32.2  26.0 - 34.0 pg   MCHC 34.2  30.0 - 36.0 g/dL   RDW 78.2  95.6 - 21.3 %   Platelets 436 (*) 150 - 400 K/uL  PROTIME-INR     Status: Abnormal   Collection Time    07/29/13  5:15 AM      Result Value Range   Prothrombin Time 25.6 (*) 11.6 - 15.2 seconds   INR  2.43 (*) 0.00 - 1.49     HEENT: normal Cardio: RRR and No murmurs, valve click Resp: CTA B/L and Unlabored GI: BS positive and Nondistended Extremity:  Pulses positive and No Edema Skin:   Intact Neuro: Alert/Oriented, Cranial Nerve II-XII normal, Normal Sensory, Abnormal Motor /5 in the left deltoid, bicep, tricep, grip, hip flexor, knee extensor ankle dorsiflexor plantar flexor 5/5 on the right side. and Abnormal FMC Ataxic/ dec FMC Moderately severe dysmetria LUE and LLE Musc/Skel:  Normal General no acute distress   Assessment/Plan: 1. Functional deficits secondary to  left cerebellar embolic infarct, hemorrhagic conversion with left hemi ataxia  which require 3+ hours per day of interdisciplinary therapy in a comprehensive inpatient rehab setting. Physiatrist is providing close team supervision and 24 hour management of active medical problems listed below. Physiatrist and rehab team continue to assess barriers to discharge/monitor patient progress toward functional and medical goals. FIM: FIM - Bathing Bathing Steps Patient Completed: Chest;Right upper leg;Right Arm;Left upper leg;Abdomen;Right lower leg (including foot);Left Arm;Left lower leg (including foot);Front perineal area;Buttocks Bathing: 6: More than reasonable amount  of time  FIM - Upper Body Dressing/Undressing Upper body dressing/undressing steps patient completed: Thread/unthread right sleeve of pullover shirt/dresss;Thread/unthread left sleeve of pullover shirt/dress;Put head through opening of pull over shirt/dress;Pull shirt over trunk Upper body dressing/undressing: 7: Complete Independence: No helper FIM - Lower Body Dressing/Undressing Lower body dressing/undressing steps patient completed: Thread/unthread right underwear leg;Thread/unthread left underwear leg;Pull underwear up/down;Thread/unthread right pants leg;Thread/unthread left pants leg;Pull pants up/down;Don/Doff right sock;Don/Doff left sock;Don/Doff  right shoe;Fasten/unfasten left shoe;Fasten/unfasten right shoe;Don/Doff left shoe Lower body dressing/undressing: 6: More than reasonable amount of time  FIM - Toileting Toileting steps completed by patient: Adjust clothing prior to toileting;Performs perineal hygiene;Adjust clothing after toileting Toileting Assistive Devices: Grab bar or rail for support Toileting: 6: Assistive device: No helper  FIM - Diplomatic Services operational officer Devices: Art gallery manager Transfers: 6-Assistive device: No helper;6-More than reasonable amt of time  FIM - Banker Devices: Arm rests;Walker Bed/Chair Transfer: 7: Independent: No helper  FIM - Locomotion: Wheelchair Distance: 170 Locomotion: Wheelchair: 5: Travels 150 ft or more: maneuvers on rugs and over door sills with supervision, cueing or coaxing FIM - Locomotion: Ambulation Locomotion: Ambulation Assistive Devices: Designer, industrial/product Ambulation/Gait Assistance: 6: Modified independent (Device/Increase time) Locomotion: Ambulation: 6: Travels 150 ft or more with assistive device/no helper  Comprehension Comprehension Mode: Auditory Comprehension: 6-Follows complex conversation/direction: With extra time/assistive device  Expression Expression Mode: Verbal Expression: 6-Expresses complex ideas: With extra time/assistive device  Social Interaction Social Interaction: 6-Interacts appropriately with others with medication or extra time (anti-anxiety, antidepressant).  Problem Solving Problem Solving: 6-Solves complex problems: With extra time  Memory Memory: 6-More than reasonable amt of time  Medical Problem List and Plan:  1. Embolic left cerebellar infarct infarct occurred when INR was somewhat therapeutic. Neurology and neurosurgery aware of hemorrhagic conversion. No progressive neurologic symptoms to warrant discontinuation of anticoagulation at this time, INR~2.0 , goal is >2.5  monitor for neuro worsening, 2. DVT Prophylaxis/Anticoagulation: Chronic Coumadin therapy with history of aortic and mitral valve replacement. 3. Pain Management: Tylenol as needed  4. Neuropsych: This patient is capable of making decisions on his own behalf.  5. History of alcohol, marijuana and tobacco abuse. Provide full counseling   LOS (Days) 13 A FACE TO FACE EVALUATION WAS PERFORMED  Angee Gupton E 07/29/2013, 8:45 AM

## 2013-07-29 NOTE — Progress Notes (Signed)
Pt discharged to home, accompanied by his brother.

## 2013-07-29 NOTE — Progress Notes (Signed)
Social Work Discharge Note Discharge Note  The overall goal for the admission was met for:   Discharge location: Yes-HOME WITH FRIENDS AND FAMILY CHECKING ON   Length of Stay: Yes-13 DAYS  Discharge activity level: Yes-MOD/I LEVEL  Home/community participation: Yes  Services provided included: MD, RD, PT, OT, SLP, RN, TR, Pharmacy and SW  Financial Services: Other: PENDING MEDICAID  Follow-up services arranged: Home Health: [facility]-PT,OT,RN,SW, DME: [facility] and Patient/Family has no preference for HH/DME agencies  Comments (or additional information):PT DID WELL AND READY TO GO HOME.  REACHED LEVEL TO BE SAFE ALONE AT HOME. MATCH PROGRAM FOR MEDS  Patient/Family verbalized understanding of follow-up arrangements: Yes  Individual responsible for coordination of the follow-up plan: SELF  Confirmed correct DME delivered: Lucy Chris 07/29/2013    Lucy Chris

## 2013-08-18 DIAGNOSIS — R269 Unspecified abnormalities of gait and mobility: Secondary | ICD-10-CM

## 2013-08-18 DIAGNOSIS — E869 Volume depletion, unspecified: Secondary | ICD-10-CM

## 2013-08-18 DIAGNOSIS — G936 Cerebral edema: Secondary | ICD-10-CM

## 2013-08-18 DIAGNOSIS — R279 Unspecified lack of coordination: Secondary | ICD-10-CM

## 2013-08-18 DIAGNOSIS — I6350 Cerebral infarction due to unspecified occlusion or stenosis of unspecified cerebral artery: Secondary | ICD-10-CM

## 2013-08-18 DIAGNOSIS — Z954 Presence of other heart-valve replacement: Secondary | ICD-10-CM

## 2013-08-18 DIAGNOSIS — Z7901 Long term (current) use of anticoagulants: Secondary | ICD-10-CM

## 2013-08-18 DIAGNOSIS — I69998 Other sequelae following unspecified cerebrovascular disease: Secondary | ICD-10-CM

## 2013-08-18 DIAGNOSIS — G935 Compression of brain: Secondary | ICD-10-CM

## 2013-08-18 DIAGNOSIS — I10 Essential (primary) hypertension: Secondary | ICD-10-CM

## 2013-08-26 ENCOUNTER — Telehealth: Payer: Self-pay

## 2013-08-26 NOTE — Telephone Encounter (Signed)
OK 

## 2013-08-26 NOTE — Telephone Encounter (Signed)
Jeff [name](PT from [facility]) is requesting a verbal order to extend patient PT for 2 times a week for 3 weeks. Is this okay?

## 2013-08-26 NOTE — Telephone Encounter (Signed)
Left a voicemail giving a verbal okay to extend patient PT 2 times a week for 3 weeks per Dr. Letta Pate.

## 2013-08-29 ENCOUNTER — Inpatient Hospital Stay: Payer: Self-pay | Admitting: Physical Medicine & Rehabilitation

## 2013-09-02 ENCOUNTER — Encounter: Payer: Self-pay | Admitting: Physical Medicine & Rehabilitation

## 2013-09-02 ENCOUNTER — Encounter: Payer: Medicaid Other | Attending: Physical Medicine & Rehabilitation

## 2013-09-02 ENCOUNTER — Ambulatory Visit (HOSPITAL_BASED_OUTPATIENT_CLINIC_OR_DEPARTMENT_OTHER): Payer: Medicaid Other | Admitting: Physical Medicine & Rehabilitation

## 2013-09-02 VITALS — BP 168/92 | HR 97 | Resp 14 | Ht 72.0 in | Wt 196.0 lb

## 2013-09-02 DIAGNOSIS — F17200 Nicotine dependence, unspecified, uncomplicated: Secondary | ICD-10-CM | POA: Insufficient documentation

## 2013-09-02 DIAGNOSIS — I658 Occlusion and stenosis of other precerebral arteries: Secondary | ICD-10-CM

## 2013-09-02 DIAGNOSIS — I663 Occlusion and stenosis of cerebellar arteries: Secondary | ICD-10-CM

## 2013-09-02 DIAGNOSIS — I69993 Ataxia following unspecified cerebrovascular disease: Secondary | ICD-10-CM | POA: Insufficient documentation

## 2013-09-02 DIAGNOSIS — Z954 Presence of other heart-valve replacement: Secondary | ICD-10-CM | POA: Insufficient documentation

## 2013-09-02 NOTE — Patient Instructions (Signed)
Finish home health therapy next week  Referral made to outpatient physical therapy at: [facility] neuro rehabilitation

## 2013-09-02 NOTE — Progress Notes (Signed)
Subjective:    Patient ID: Jeff [name], male    DOB: Oct 09, 1955, 58 y.o.   MRN: [id number] This is a 58 year old right-handed male  with history of aortic and mitral valve replacement on chronic Coumadin  therapy as well as CVA in July 2012. He presented on July 10, 2013,  with nausea, vomiting, as well as ataxia. The patient was independent  prior to admission. He does not drive. Cranial CT scan showed a large  left acute cerebellar ischemic infarct with mass effect with effacement  of the 4th ventricle as well as mild downward compression of the  cerebellar tonsils. No signs of hydrocephalus. There was also multiple  old bihemispheric infarcts. CT angiogram of the head with no evidence  of stenosis or occlusion. INR on admission of 0.96. The patient did  not receive tPA. Urine drug screen was positive for marijuana. Carotid  Dopplers with no ICA stenosis. Echocardiogram with ejection fraction of  60%, unchanged from prior studies. The patient remained on chronic  Coumadin as well as the addition of intravenous heparin until INR  therapeutic  HPI Home health PT. Using straight cane. Pain Inventory Average Pain 0 Pain Right Now 0 My pain is no pain  In the last 24 hours, has pain interfered with the following? General activity 0 Relation with others 0 Enjoyment of life 0 What TIME of day is your pain at its worst? night Sleep (in general) Good  Pain is worse with: walking Pain improves with: na Relief from Meds: no pain meds  Mobility walk with assistance use a cane ability to climb steps?  yes do you drive?  no  Function not employed: date last employed 03/26/2002 I need assistance with the following:  household duties Do you have any goals in this area?  no  Neuro/Psych No problems in this area  Prior Studies Any changes since last visit?  no  Physicians involved in your care Any changes since last visit?  no   Family History  Problem Relation Age of  Onset  . Diabetes Mother     Died before her 30 birthday   History   Social History  . Marital Status: Single    Spouse Name: N/A    Number of Children: N/A  . Years of Education: N/A   Social History Main Topics  . Smoking status: Current Every Day Smoker -- 0.05 packs/day    Types: Cigarettes  . Smokeless tobacco: None  . Alcohol Use: 8.4 oz/week    14 Cans of beer per week  . Drug Use: 7.00 per week    Special: Marijuana  . Sexual Activity: Yes   Other Topics Concern  . None   Social History Narrative  . None   Past Surgical History  Procedure Laterality Date  . Cardiac valve replacement     Past Medical History  Diagnosis Date  . History of CVA (cerebrovascular accident)     02/2011  . Hx of aortic valve replacement, mechanical   . H/O mitral valve replacement with mechanical valve   . HX: long term anticoagulant use    BP 168/92  Pulse 97  Resp 14  Ht 6' (1.829 m)  Wt 196 lb (88.905 kg)  BMI 26.58 kg/m2  SpO2 97%     Review of Systems  Constitutional: Positive for unexpected weight change.  All other systems reviewed and are negative.       Objective:   Physical Exam  Left upper extremity 4/5 in  the deltoid, bicep, tricep, grip Moderate dysmetria left finger nose finger Mild dysmetria left heel to shin 5/5 bilateral hip flexors knee extensors ankle dorsiflexor and plantar flexor 5/5 right deltoid, bicep, tricep, grip  Romberg is negative for fall but does have increased sway Ambulates with a straight cane. Wide base of support. No toe drag were knee instability      Assessment & Plan:  1. Embolic left cerebellar infarct with Lynne and truncal ataxia. Progressing but still requiring cane and with reduced balance. Stroke onset approximately 2 months ago. Anticipate improvement over the next 4 months. Will make referral to outpatient therapy

## 2013-09-26 ENCOUNTER — Ambulatory Visit: Payer: Medicaid Other | Attending: Physical Medicine & Rehabilitation | Admitting: Physical Therapy

## 2013-09-26 DIAGNOSIS — IMO0001 Reserved for inherently not codable concepts without codable children: Secondary | ICD-10-CM | POA: Insufficient documentation

## 2013-09-26 DIAGNOSIS — R269 Unspecified abnormalities of gait and mobility: Secondary | ICD-10-CM | POA: Insufficient documentation

## 2013-10-02 ENCOUNTER — Ambulatory Visit: Payer: Medicaid Other | Admitting: Physical Therapy

## 2013-10-09 ENCOUNTER — Ambulatory Visit: Payer: Self-pay | Admitting: Physical Therapy

## 2013-10-16 ENCOUNTER — Ambulatory Visit: Payer: Medicaid Other | Attending: Physical Medicine & Rehabilitation | Admitting: Physical Therapy

## 2013-10-16 DIAGNOSIS — R269 Unspecified abnormalities of gait and mobility: Secondary | ICD-10-CM | POA: Insufficient documentation

## 2013-10-27 ENCOUNTER — Encounter: Payer: Medicaid Other | Attending: Physical Medicine & Rehabilitation

## 2013-10-27 ENCOUNTER — Encounter: Payer: Self-pay | Admitting: Physical Medicine & Rehabilitation

## 2013-10-27 ENCOUNTER — Ambulatory Visit (HOSPITAL_BASED_OUTPATIENT_CLINIC_OR_DEPARTMENT_OTHER): Payer: Medicaid Other | Admitting: Physical Medicine & Rehabilitation

## 2013-10-27 VITALS — BP 122/78 | HR 101 | Resp 14 | Ht 72.0 in | Wt 195.0 lb

## 2013-10-27 DIAGNOSIS — I69993 Ataxia following unspecified cerebrovascular disease: Secondary | ICD-10-CM | POA: Insufficient documentation

## 2013-10-27 DIAGNOSIS — F17200 Nicotine dependence, unspecified, uncomplicated: Secondary | ICD-10-CM | POA: Insufficient documentation

## 2013-10-27 DIAGNOSIS — I6350 Cerebral infarction due to unspecified occlusion or stenosis of unspecified cerebral artery: Secondary | ICD-10-CM

## 2013-10-27 DIAGNOSIS — Z954 Presence of other heart-valve replacement: Secondary | ICD-10-CM | POA: Insufficient documentation

## 2013-10-27 DIAGNOSIS — I639 Cerebral infarction, unspecified: Secondary | ICD-10-CM

## 2013-10-27 NOTE — Progress Notes (Signed)
Subjective:    Patient ID: Jeff [name], male    DOB: [birth year], 58 y.o.   MRN: [id number]  HPI 58 year old right-handed male  with history of aortic and mitral valve replacement on chronic Coumadin  therapy as well as CVA in July 2012. He presented on July 10, 2013,  with nausea, vomiting, as well as ataxia. The patient was independent  prior to admission. He does not drive. Cranial CT scan showed a large  left acute cerebellar ischemic infarct with mass effect with effacement  of the 4th ventricle as well as mild downward compression of the  cerebellar tonsils. No signs of hydrocephalus. There was also multiple  old bihemispheric infarcts. CT angiogram of the head with no evidence  of stenosis or occlusion. INR on admission of 0.96.  outpt therapy now has medicaid No falls Taking blood pressure medicine as well as warfarin. [facility] Pain Inventory Average Pain 0 Pain Right Now 0 My pain is no pain  In the last 24 hours, has pain interfered with the following? General activity 0 Relation with others 10 Enjoyment of life 10 What TIME of day is your pain at its worst? morning Sleep (in general) Fair  Pain is worse with: walking Pain improves with: movement Relief from Meds: no pain meds  Mobility walk with assistance use a cane ability to climb steps?  yes transfers alone  Function not employed: date last employed na  Neuro/Psych No problems in this area  Prior Studies Any changes since last visit?  no  Physicians involved in your care Any changes since last visit?  no   Family History  Problem Relation Age of Onset  . Diabetes Mother     Died before her 16 birthday   History   Social History  . Marital Status: Single    Spouse Name: N/A    Number of Children: N/A  . Years of Education: N/A   Social History Main Topics  . Smoking status: Current Every Day Smoker -- 0.05 packs/day    Types: Cigarettes  . Smokeless  tobacco: None  . Alcohol Use: 8.4 oz/week    14 Cans of beer per week  . Drug Use: 7.00 per week    Special: Marijuana  . Sexual Activity: Yes   Other Topics Concern  . None   Social History Narrative  . None   Past Surgical History  Procedure Laterality Date  . Cardiac valve replacement     Past Medical History  Diagnosis Date  . History of CVA (cerebrovascular accident)     02/2011  . Hx of aortic valve replacement, mechanical   . H/O mitral valve replacement with mechanical valve   . HX: long term anticoagulant use    BP 122/78  Pulse 101  Resp 14  Ht 6' (1.829 m)  Wt 195 lb (88.451 kg)  BMI 26.44 kg/m2  SpO2 98%  Opioid Risk Score:   Fall Risk Score: Low Fall Risk (0-5 points) (pt educated on fall risk, pt received brochure previously)    Review of Systems  All other systems reviewed and are negative.       Objective:   Physical Exam  Left upper extremity 5-/5 in the deltoid, bicep, tricep, grip  Mild/Moderate dysmetria left finger nose finger  Mild dysmetria left heel to shin  5/5 bilateral hip flexors knee extensors ankle dorsiflexor and plantar flexor  5/5 right deltoid, bicep, tricep, grip  Romberg is negative for fall  but does have increased sway  Ambulates without straight cane. Wide base of support. trunkal ataxia noted       Assessment & Plan:  Embolic left cerebellar infarct with Lynne and truncal ataxia. Progressing but still requiring cane and with reduced balance. Stroke onset approximately 4 months ago. Anticipate improvement over the next 2 months Cont outpt PT /OT

## 2013-10-31 ENCOUNTER — Ambulatory Visit: Payer: Medicaid Other | Admitting: Physical Therapy

## 2013-11-17 ENCOUNTER — Ambulatory Visit: Payer: Medicaid Other | Attending: Physical Medicine & Rehabilitation | Admitting: Physical Therapy

## 2013-11-17 ENCOUNTER — Ambulatory Visit: Payer: Medicaid Other | Admitting: Physical Therapy

## 2013-11-17 DIAGNOSIS — R269 Unspecified abnormalities of gait and mobility: Secondary | ICD-10-CM | POA: Insufficient documentation

## 2013-11-19 ENCOUNTER — Ambulatory Visit: Payer: Medicaid Other | Admitting: Physical Therapy

## 2013-11-24 ENCOUNTER — Ambulatory Visit: Payer: Medicaid Other | Admitting: Physical Therapy

## 2013-11-26 ENCOUNTER — Ambulatory Visit: Payer: Medicaid Other | Admitting: Physical Therapy

## 2013-12-01 ENCOUNTER — Ambulatory Visit: Payer: Medicaid Other | Admitting: Physical Therapy

## 2013-12-03 ENCOUNTER — Ambulatory Visit: Payer: Medicaid Other | Admitting: Physical Therapy

## 2013-12-08 ENCOUNTER — Ambulatory Visit: Payer: Medicaid Other | Admitting: Physical Therapy

## 2013-12-10 ENCOUNTER — Ambulatory Visit: Payer: Medicaid Other | Admitting: Physical Therapy

## 2013-12-11 ENCOUNTER — Ambulatory Visit: Payer: Medicaid Other | Admitting: Physical Therapy

## 2013-12-23 ENCOUNTER — Encounter: Payer: Medicaid Other | Attending: Physical Medicine & Rehabilitation

## 2013-12-23 ENCOUNTER — Ambulatory Visit: Payer: Medicaid Other | Admitting: Physical Medicine & Rehabilitation

## 2013-12-23 DIAGNOSIS — I69993 Ataxia following unspecified cerebrovascular disease: Secondary | ICD-10-CM | POA: Insufficient documentation

## 2013-12-23 DIAGNOSIS — F17200 Nicotine dependence, unspecified, uncomplicated: Secondary | ICD-10-CM | POA: Insufficient documentation

## 2013-12-23 DIAGNOSIS — Z954 Presence of other heart-valve replacement: Secondary | ICD-10-CM | POA: Insufficient documentation

## 2014-09-07 ENCOUNTER — Observation Stay (HOSPITAL_COMMUNITY)
Admission: EM | Admit: 2014-09-07 | Discharge: 2014-09-10 | Disposition: A | Discharge status: Home / Self Care | Payer: Medicaid Other | Attending: Cardiology | Admitting: Cardiology

## 2014-09-07 ENCOUNTER — Encounter (HOSPITAL_COMMUNITY): Payer: Self-pay | Admitting: Emergency Medicine

## 2014-09-07 DIAGNOSIS — I10 Essential (primary) hypertension: Secondary | ICD-10-CM | POA: Diagnosis not present

## 2014-09-07 DIAGNOSIS — Z8673 Personal history of transient ischemic attack (TIA), and cerebral infarction without residual deficits: Secondary | ICD-10-CM

## 2014-09-07 DIAGNOSIS — D689 Coagulation defect, unspecified: Secondary | ICD-10-CM | POA: Diagnosis present

## 2014-09-07 DIAGNOSIS — Z952 Presence of prosthetic heart valve: Secondary | ICD-10-CM | POA: Insufficient documentation

## 2014-09-07 DIAGNOSIS — R791 Abnormal coagulation profile: Secondary | ICD-10-CM

## 2014-09-07 DIAGNOSIS — F17210 Nicotine dependence, cigarettes, uncomplicated: Secondary | ICD-10-CM | POA: Insufficient documentation

## 2014-09-07 DIAGNOSIS — Z7901 Long term (current) use of anticoagulants: Secondary | ICD-10-CM | POA: Diagnosis not present

## 2014-09-07 DIAGNOSIS — Z954 Presence of other heart-valve replacement: Secondary | ICD-10-CM

## 2014-09-07 LAB — CBC WITH DIFFERENTIAL/PLATELET
Basophils Absolute: 0 10*3/uL (ref 0.0–0.1)
Basophils Relative: 0 % (ref 0–1)
EOS PCT: 2 % (ref 0–5)
Eosinophils Absolute: 0.1 10*3/uL (ref 0.0–0.7)
HCT: 39.1 % (ref 39.0–52.0)
Hemoglobin: 13.4 g/dL (ref 13.0–17.0)
Lymphocytes Relative: 25 % (ref 12–46)
Lymphs Abs: 1.5 10*3/uL (ref 0.7–4.0)
MCH: 32.1 pg (ref 26.0–34.0)
MCHC: 34.3 g/dL (ref 30.0–36.0)
MCV: 93.8 fL (ref 78.0–100.0)
Monocytes Absolute: 0.6 10*3/uL (ref 0.1–1.0)
Monocytes Relative: 9 % (ref 3–12)
NEUTROS ABS: 3.9 10*3/uL (ref 1.7–7.7)
Neutrophils Relative %: 64 % (ref 43–77)
Platelets: 329 10*3/uL (ref 150–400)
RBC: 4.17 MIL/uL — AB (ref 4.22–5.81)
RDW: 13.1 % (ref 11.5–15.5)
WBC: 6.1 10*3/uL (ref 4.0–10.5)

## 2014-09-07 LAB — COMPREHENSIVE METABOLIC PANEL
ALT: 21 U/L (ref 0–53)
AST: 29 U/L (ref 0–37)
Albumin: 4.2 g/dL (ref 3.5–5.2)
Alkaline Phosphatase: 93 U/L (ref 39–117)
Anion gap: 8 (ref 5–15)
BUN: 14 mg/dL (ref 6–23)
CO2: 25 mmol/L (ref 19–32)
CREATININE: 1.08 mg/dL (ref 0.50–1.35)
Calcium: 9.6 mg/dL (ref 8.4–10.5)
Chloride: 108 mmol/L (ref 96–112)
GFR calc Af Amer: 84 mL/min — ABNORMAL LOW (ref >90)
GFR, EST NON AFRICAN AMERICAN: 73 mL/min — AB (ref >90)
GLUCOSE: 90 mg/dL (ref 70–99)
Potassium: 4.1 mmol/L (ref 3.5–5.1)
Sodium: 141 mmol/L (ref 135–145)
TOTAL PROTEIN: 7.3 g/dL (ref 6.0–8.3)
Total Bilirubin: 0.6 mg/dL (ref 0.3–1.2)

## 2014-09-07 LAB — PROTIME-INR
INR: 9.08 (ref 0.00–1.49)
Prothrombin Time: 74.5 seconds — ABNORMAL HIGH (ref 11.6–15.2)

## 2014-09-07 NOTE — ED Notes (Signed)
Pt reassessed- pt still has no complaints.

## 2014-09-07 NOTE — ED Provider Notes (Signed)
CSN: [id number]     Arrival date & time 09/07/14  1623 History   First MD Initiated Contact with Patient 09/07/14 2108     Chief Complaint  Patient presents with  . Abnormal Lab     (Consider location/radiation/quality/duration/timing/severity/associated sxs/prior Treatment) HPI Comments: Patient presents with an elevated INR. He is on Coumadin for a mechanical heart valve. He states that he missed a dose of Coumadin recently and so he doubled up on his Coumadin over the last 2-3 days. He's been taking 20 mg versus 10 mg for at least 2-3 days. He had his INR checked today and got called that it was over 9. He was told to report to the emergency room. He currently denies any complaints. He denies any headache or dizziness or any signs of bleeding.   Past Medical History  Diagnosis Date  . History of CVA (cerebrovascular accident)     02/2011  . Hx of aortic valve replacement, mechanical   . H/O mitral valve replacement with mechanical valve   . HX: long term anticoagulant use    Past Surgical History  Procedure Laterality Date  . Cardiac valve replacement     Family History  Problem Relation Age of Onset  . Diabetes Mother     Died before her 1 birthday   History  Substance Use Topics  . Smoking status: Current Every Day Smoker -- 0.05 packs/day    Types: Cigarettes  . Smokeless tobacco: Not on file  . Alcohol Use: 8.4 oz/week    14 Cans of beer per week    Review of Systems  Constitutional: Negative for fever, chills, diaphoresis and fatigue.  HENT: Negative for congestion, rhinorrhea and sneezing.   Eyes: Negative.   Respiratory: Negative for cough, chest tightness and shortness of breath.   Cardiovascular: Negative for chest pain and leg swelling.  Gastrointestinal: Negative for nausea, vomiting, abdominal pain, diarrhea and blood in stool.  Genitourinary: Negative for frequency, hematuria, flank pain and difficulty urinating.  Musculoskeletal: Negative for back pain  and arthralgias.  Skin: Negative for rash.  Neurological: Negative for dizziness, speech difficulty, weakness, numbness and headaches.      Allergies  Review of patient's allergies indicates no known allergies.  Home Medications   Prior to Admission medications   Medication Sig Start Date End Date Taking? Authorizing Provider  hydrochlorothiazide (MICROZIDE) 12.5 MG capsule Take 1 capsule (12.5 mg total) by mouth daily. 07/29/13  Yes Daniel J Angiulli, PA-C  warfarin (COUMADIN) 10 MG tablet Take 1 tablet (10 mg total) by mouth daily at 6 PM. 07/29/13  Yes Daniel J Angiulli, PA-C   BP 143/95 mmHg  Pulse 75  Temp(Src) 98.5 F (36.9 C) (Oral)  Resp 15  SpO2 99% Physical Exam  Constitutional: He is oriented to person, place, and time. He appears well-developed and well-nourished.  HENT:  Head: Normocephalic and atraumatic.  Eyes: Pupils are equal, round, and reactive to light.  Neck: Normal range of motion. Neck supple.  Cardiovascular: Normal rate, regular rhythm and normal heart sounds.   Pulmonary/Chest: Effort normal and breath sounds normal. No respiratory distress. He has no wheezes. He has no rales. He exhibits no tenderness.  Abdominal: Soft. Bowel sounds are normal. There is no tenderness. There is no rebound and no guarding.  Musculoskeletal: Normal range of motion. He exhibits no edema.  Lymphadenopathy:    He has no cervical adenopathy.  Neurological: He is alert and oriented to person, place, and time.  Skin: Skin is  warm and dry. No rash noted.  Psychiatric: He has a normal mood and affect.    ED Course  Procedures (including critical care time) Labs Review Labs Reviewed  CBC WITH DIFFERENTIAL/PLATELET - Abnormal; Notable for the following:    RBC 4.17 (*)    All other components within normal limits  COMPREHENSIVE METABOLIC PANEL - Abnormal; Notable for the following:    GFR calc non Af Amer 73 (*)    GFR calc Af Amer 84 (*)    All other components within  normal limits  PROTIME-INR - Abnormal; Notable for the following:    Prothrombin Time 74.5 (*)    INR 9.08 (*)    All other components within normal limits    Imaging Review No results found.   EKG Interpretation None      MDM   Final diagnoses:  Coagulopathy    Patient has an elevated INR at 9. He does have a history of prior stroke and has some residual deficits. He walks with a cane. He's fairly unsteady on his feet. He lives alone. I feel that given this, he is at high risk for falls and given his INR 9 refill it better to obs him until it is down to a safer level.  Dr. Hal Hope to admit.  Will hold coumadin.    Malvin Johns, MD 09/07/14 2250

## 2014-09-07 NOTE — ED Notes (Signed)
Pt sent here for issue with INR; pt denies complaint

## 2014-09-07 NOTE — ED Notes (Addendum)
Pt reports having PT/INR levels checked last week, was called today, and referred to ED for "low coumadin levels." INR 9.08. Pt reports missing a dose and took two pills when we remembered. Also reports increased intake of "greens." Pt denies any complaints at this time.

## 2014-09-07 NOTE — H&P (Addendum)
[facility] History and Physical  Jeff [name] MGQ:[id number] DOB: Apr 06, 1956 DOA: 09/07/2014  Referring physician: ER physician. PCP: Patricia Nettle, MD   Chief Complaint: Elevated INR.  HPI: Jeff [name] is a 59 y.o. 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 In the ER patient's INR was found to be around 9. Patient did not have any bleeding or any ecchymosis. Patient on walking was found to be unsteady due to his previous CVA and given his elevated INR at this time patient has been admitted for further observation as patient has risk for falls. Patient denies any chest pain or shortness of breath.   Review of Systems: As presented in the history of presenting illness, rest negative.  Past Medical History  Diagnosis Date  . History of CVA (cerebrovascular accident)     02/2011  . Hx of aortic valve replacement, mechanical   . H/O mitral valve replacement with mechanical valve   . HX: long term anticoagulant use    Past Surgical History  Procedure Laterality Date  . Cardiac valve replacement     Social History:  reports that he has been smoking Cigarettes.  He has been smoking about 0.05 packs per day. He does not have any smokeless tobacco history on file. He reports that he drinks about 8.4 oz of alcohol per week. He reports that he uses illicit drugs (Marijuana) about 7 times per week. Where does patient live home. Can patient participate in ADLs? Yes.  No Known Allergies  Family History:  Family History  Problem Relation Age of Onset  . Diabetes Mother     Died before her 77 birthday      Prior to Admission medications   Medication Sig Start Date End Date Taking? Authorizing Provider  hydrochlorothiazide (MICROZIDE) 12.5 MG capsule Take 1 capsule (12.5 mg total) by mouth daily. 07/29/13  Yes Daniel J Angiulli, PA-C  warfarin (COUMADIN)  10 MG tablet Take 1 tablet (10 mg total) by mouth daily at 6 PM. 07/29/13  Yes Cathlyn Parsons, PA-C    Physical Exam: Filed Vitals:   09/07/14 1900 09/07/14 2104 09/07/14 2230 09/07/14 2300  BP: 132/98 149/97 143/95 150/92  Pulse: 79 81 75 73  Temp: 98.5 F (36.9 C)     TempSrc: Oral     Resp: 16 16 15 17   SpO2: 99% 99% 99% 99%     General:  Moderately built and nourished.  Eyes: Anicteric no pallor.  ENT: No discharge from the ears eyes nose or mouth.  Neck: No mass felt.  Cardiovascular: S1-S2 heard.  Respiratory: No rhonchi or crepitations.  Abdomen: Soft nontender bowel sounds present.  Skin: No rash.  Musculoskeletal: No edema.  Psychiatric: Appears normal.  Neurologic: Alert awake oriented to time place and person. Moves all extremities.  Labs on Admission:  Basic Metabolic Panel:  Recent Labs Lab 09/07/14 1655  NA 141  K 4.1  CL 108  CO2 25  GLUCOSE 90  BUN 14  CREATININE 1.08  CALCIUM 9.6   Liver Function Tests:  Recent Labs Lab 09/07/14 1655  AST 29  ALT 21  ALKPHOS 93  BILITOT 0.6  PROT 7.3  ALBUMIN 4.2   No results for input(s): LIPASE, AMYLASE in the last 168 hours. No results for input(s): AMMONIA in the last 168 hours. CBC:  Recent Labs Lab 09/07/14 1655  WBC 6.1  NEUTROABS 3.9  HGB 13.4  HCT 39.1  MCV 93.8  PLT 329   Cardiac Enzymes: No results for input(s): CKTOTAL, CKMB, CKMBINDEX, TROPONINI in the last 168 hours.  BNP (last 3 results) No results for input(s): PROBNP in the last 8760 hours. CBG: No results for input(s): GLUCAP in the last 168 hours.  Radiological Exams on Admission: No results found.   Assessment/Plan Principal Problem:   Supratherapeutic INR Active Problems:   History of CVA (cerebrovascular accident)   Hx of aortic valve replacement, mechanical   H/O mitral valve replacement with mechanical valve   1. Supratherapeutic INR - at this time we will hold Coumadin and further doses per  pharmacy. Not reversing Coumadin since patient has 2 mechanical valves. Closely observe INR and for any bleeding. 2. History of CVA with ataxia - patient is on Coumadin. 3. History of mechanical aortic valve and mitral valve.  Addendum - Discussed with Dr.Harwani. Dr.Harwani will be assuming care of patient.   DVT Prophylaxis on Coumadin.  Code Status: Full code.  Family Communication: None.  Disposition Plan: Admit for observation.    Luby Seamans N. [facility] Pager [phone].  If 7PM-7AM, please contact night-coverage [email or web address] Password [email or web address] 09/07/2014, 11:18 PM

## 2014-09-07 NOTE — ED Notes (Signed)
Dr. Kakrakandy at bedside. 

## 2014-09-08 ENCOUNTER — Encounter (HOSPITAL_COMMUNITY): Payer: Self-pay | Admitting: *Deleted

## 2014-09-08 LAB — CBC WITH DIFFERENTIAL/PLATELET
BASOS PCT: 0 % (ref 0–1)
Basophils Absolute: 0 10*3/uL (ref 0.0–0.1)
EOS ABS: 0.1 10*3/uL (ref 0.0–0.7)
EOS PCT: 3 % (ref 0–5)
HEMATOCRIT: 37.2 % — AB (ref 39.0–52.0)
HEMOGLOBIN: 12.5 g/dL — AB (ref 13.0–17.0)
LYMPHS PCT: 23 % (ref 12–46)
Lymphs Abs: 1.3 10*3/uL (ref 0.7–4.0)
MCH: 30.9 pg (ref 26.0–34.0)
MCHC: 33.6 g/dL (ref 30.0–36.0)
MCV: 92.1 fL (ref 78.0–100.0)
MONO ABS: 0.6 10*3/uL (ref 0.1–1.0)
MONOS PCT: 10 % (ref 3–12)
NEUTROS ABS: 3.6 10*3/uL (ref 1.7–7.7)
NEUTROS PCT: 64 % (ref 43–77)
Platelets: 311 10*3/uL (ref 150–400)
RBC: 4.04 MIL/uL — ABNORMAL LOW (ref 4.22–5.81)
RDW: 12.9 % (ref 11.5–15.5)
WBC: 5.6 10*3/uL (ref 4.0–10.5)

## 2014-09-08 LAB — COMPREHENSIVE METABOLIC PANEL
ALK PHOS: 87 U/L (ref 39–117)
ALT: 21 U/L (ref 0–53)
AST: 26 U/L (ref 0–37)
Albumin: 3.7 g/dL (ref 3.5–5.2)
Anion gap: 8 (ref 5–15)
BILIRUBIN TOTAL: 0.6 mg/dL (ref 0.3–1.2)
BUN: 13 mg/dL (ref 6–23)
CO2: 25 mmol/L (ref 19–32)
Calcium: 8.9 mg/dL (ref 8.4–10.5)
Chloride: 105 mmol/L (ref 96–112)
Creatinine, Ser: 1.08 mg/dL (ref 0.50–1.35)
GFR calc Af Amer: 84 mL/min — ABNORMAL LOW (ref >90)
GFR, EST NON AFRICAN AMERICAN: 73 mL/min — AB (ref >90)
Glucose, Bld: 95 mg/dL (ref 70–99)
Potassium: 3.7 mmol/L (ref 3.5–5.1)
Sodium: 138 mmol/L (ref 135–145)
Total Protein: 6.4 g/dL (ref 6.0–8.3)

## 2014-09-08 LAB — PROTIME-INR
INR: 9.02 (ref 0.00–1.49)
INR: 7.37 — AB (ref 0.00–1.49)
PROTHROMBIN TIME: 74.1 s — AB (ref 11.6–15.2)
Prothrombin Time: 63.3 seconds — ABNORMAL HIGH (ref 11.6–15.2)

## 2014-09-08 MED ORDER — NICOTINE 21 MG/24HR TD PT24
21.0000 mg | MEDICATED_PATCH | Freq: Every day | TRANSDERMAL | Status: DC
  Administered 2014-09-08 – 2014-09-10 (×3): 21 mg via TRANSDERMAL
  Filled 2014-09-08 (×3): qty 1

## 2014-09-08 MED ORDER — ONDANSETRON HCL 4 MG PO TABS: 4.0000 mg | ORAL_TABLET | Freq: Four times a day (QID) | ORAL | Status: DC | PRN

## 2014-09-08 MED ORDER — ACETAMINOPHEN 325 MG PO TABS
650.0000 mg | ORAL_TABLET | Freq: Four times a day (QID) | ORAL | Status: DC | PRN
  Administered 2014-09-08: 650 mg via ORAL
  Filled 2014-09-08: qty 2

## 2014-09-08 MED ORDER — SODIUM CHLORIDE 0.9 % IJ SOLN
3.0000 mL | Freq: Two times a day (BID) | INTRAMUSCULAR | Status: DC
  Administered 2014-09-08 – 2014-09-10 (×6): 3 mL via INTRAVENOUS

## 2014-09-08 MED ORDER — WARFARIN - PHARMACIST DOSING INPATIENT: Freq: Every day | Status: DC

## 2014-09-08 MED ORDER — ONDANSETRON HCL 4 MG/2ML IJ SOLN: 4.0000 mg | Freq: Four times a day (QID) | INTRAMUSCULAR | Status: DC | PRN

## 2014-09-08 MED ORDER — ACETAMINOPHEN 650 MG RE SUPP: 650.0000 mg | Freq: Four times a day (QID) | RECTAL | Status: DC | PRN

## 2014-09-08 MED ORDER — HYDROCHLOROTHIAZIDE 12.5 MG PO CAPS: 12.5000 mg | ORAL_CAPSULE | Freq: Every day | ORAL | Status: DC

## 2014-09-08 MED ORDER — HYDROCHLOROTHIAZIDE 12.5 MG PO CAPS
12.5000 mg | ORAL_CAPSULE | Freq: Every day | ORAL | Status: DC
  Administered 2014-09-08 – 2014-09-10 (×4): 12.5 mg via ORAL
  Filled 2014-09-08 (×4): qty 1

## 2014-09-08 NOTE — Progress Notes (Signed)
CRITICAL VALUE ALERT  Critical value received: INR 7.37  Date of notification:  09/08/14  Time of notification:  1943  Critical value read back:Yes.    Nurse who received alert:  Ranelle Oyster, RN  MD notified (1st page):  Dr. Terrence Dupont  Time of first page: 1948  MD notified (2nd page):  Time of second page:  Responding MD: Dr. Terrence Dupont  Time MD responded:  1959  No new orders given.

## 2014-09-08 NOTE — Progress Notes (Signed)
UR completed 

## 2014-09-08 NOTE — Evaluation (Addendum)
Physical Therapy Evaluation and Discharge Patient Details Name: Jeff [name] MRN: [id number] DOB: [birth year] Today's Date: 09/08/2014   History of Present Illness   Jeff [name] is a 59 y.o. 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 In the ER patient's INR was found to be around 9. Patient did not have any bleeding or any ecchymosis. Patient on walking was found to be unsteady due to his previous CVA and given his elevated INR at this time patient has been admitted for further observation as patient has risk for falls. Patient denies any chest pain or shortness of breath.   Clinical Impression  Pt at or approaching his baseline functioning.  Stable and safe paretic gait with a cane.  No further PT needs as have asked pt to continue ambulation in hospital with staff since his INR's are still so high.  Will sign off at this time.     Follow Up Recommendations No PT follow up    Equipment Recommendations  None recommended by PT    Recommendations for Other Services       Precautions / Restrictions Precautions Precautions:  (minimal fall risk with assistive device)      Mobility  Bed Mobility                  Transfers Overall transfer level: Modified independent Equipment used: Straight cane                Ambulation/Gait Ambulation/Gait assistance: Supervision Ambulation Distance (Feet): 300 Feet Assistive device: Straight cane Gait Pattern/deviations: Step-through pattern Gait velocity: moderate speed; able to increase speed to cue, but not comfortable with additional speed.   General Gait Details: Mild paretic, but stable and safe with Std. cane  Stairs Stairs: Yes Stairs assistance: Supervision Stair Management: One rail Right;Alternating pattern;Step to pattern;Forwards Number of Stairs: 5 General stair comments: safe with  rail  Wheelchair Mobility    Modified Rankin (Stroke Patients Only)       Balance Overall balance assessment: Needs assistance Sitting-balance support: No upper extremity supported Sitting balance-Leahy Scale: Good     Standing balance support: Single extremity supported Standing balance-Leahy Scale: Poor                               Pertinent Vitals/Pain Pain Assessment: No/denies pain    Home Living Family/patient expects to be discharged to:: Private residence Living Arrangements: Alone Available Help at Discharge: Friend(s);Personal care attendant (PCA 2 hours/day 7 days/week) Type of Home: Apartment Home Access: Level entry     Home Layout: One level Home Equipment: Cane - single point;Shower seat;Grab bars - toilet;Grab bars - tub/shower      Prior Function Level of Independence: Independent with assistive device(s)               Hand Dominance        Extremity/Trunk Assessment   Upper Extremity Assessment: Defer to OT evaluation           Lower Extremity Assessment: Overall WFL for tasks assessed;LLE deficits/detail   LLE Deficits / Details: mildly paretic and uncoordinated     Communication   Communication: No difficulties  Cognition Arousal/Alertness: Awake/alert Behavior During Therapy: WFL for tasks assessed/performed Overall Cognitive Status: Within Functional Limits for tasks assessed  General Comments      Exercises        Assessment/Plan    PT Assessment Patent does not need any further PT services  PT Diagnosis Abnormality of gait   PT Problem List    PT Treatment Interventions     PT Goals (Current goals can be found in the Care Plan section) Acute Rehab PT Goals PT Goal Formulation: All assessment and education complete, DC therapy    Frequency     Barriers to discharge        Co-evaluation               End of Session   Activity Tolerance: Patient  tolerated treatment well Patient left: in chair;with call bell/phone within reach;with family/visitor present Nurse Communication: Mobility status    Functional Assessment Tool Used: clinical judgement Functional Limitation: Mobility: Walking and moving around Mobility: Walking and Moving Around Current Status [phone]): 0 percent impaired, limited or restricted Mobility: Walking and Moving Around Goal Status [phone]): 0 percent impaired, limited or restricted Mobility: Walking and Moving Around Discharge Status [phone]): 0 percent impaired, limited or restricted    Time: 1237-1258 PT Time Calculation (min) (ACUTE ONLY): 21 min   Charges:   PT Evaluation $Initial PT Evaluation Tier I: 1 Procedure PT Treatments $Gait Training: 8-22 mins   PT G Codes:   PT G-Codes **NOT FOR INPATIENT CLASS** Functional Assessment Tool Used: clinical judgement Functional Limitation: Mobility: Walking and moving around Mobility: Walking and Moving Around Current Status (J6734): 0 percent impaired, limited or restricted Mobility: Walking and Moving Around Goal Status (L9379): 0 percent impaired, limited or restricted Mobility: Walking and Moving Around Discharge Status [phone]): 0 percent impaired, limited or restricted    Jeff [name], Tessie Fass 09/08/2014, 1:09 PM 09/08/2014  Jeff [name], [place] [phone]  (pager)

## 2014-09-08 NOTE — Progress Notes (Signed)
ANTICOAGULATION CONSULT NOTE - Initial Consult  Pharmacy Consult for Coumadin Indication: mechanical AVR/MVR  No Known Allergies  Patient Measurements: Height: 6' (182.9 cm) Weight: 173 lb 8 oz (78.7 kg) IBW/kg (Calculated) : 77.6   Vital Signs: Temp: 98 F (36.7 C) (01/25 2354) Temp Source: Oral (01/25 2354) BP: 147/103 mmHg (01/25 2354) Pulse Rate: 80 (01/25 2354)  Labs:  Recent Labs  09/07/14 1655  HGB 13.4  HCT 39.1  PLT 329  LABPROT 74.5*  INR 9.08*  CREATININE 1.08    Estimated Creatinine Clearance: 79.8 mL/min (by C-G formula based on Cr of 1.08).   Medical History: Past Medical History  Diagnosis Date  . History of CVA (cerebrovascular accident)     02/2011  . Hx of aortic valve replacement, mechanical   . H/O mitral valve replacement with mechanical valve   . HX: long term anticoagulant use     Medications:  Prescriptions prior to admission  Medication Sig Dispense Refill Last Dose  . hydrochlorothiazide (MICROZIDE) 12.5 MG capsule Take 1 capsule (12.5 mg total) by mouth daily. 30 capsule 1 09/06/2014 at Unknown time  . warfarin (COUMADIN) 10 MG tablet Take 1 tablet (10 mg total) by mouth daily at 6 PM. 30 tablet 1 09/06/2014 at Unknown time    Assessment: 59 yo male with h/o AVR/MVR, supratherapeutic INR today, for Coumadin  Goal of Therapy:  INR 2.5-3.5 Monitor platelets by anticoagulation protocol: Yes   Plan:  F/U daily INR Coumadin education prior to discharge  Caryl Pina 09/08/2014,12:16 AM

## 2014-09-08 NOTE — Progress Notes (Signed)
CRITICAL VALUE ALERT  Critical value received:  INR=9.02; PT=74.1  Date of notification:  09/08/2014  Time of notification:  1610  Critical value read back:Yes.    Nurse who received alert:  Alphonsus Sias  MD notified (1st page):  [facility] [phone]   Time of first page:  [phone]  MD notified (2nd page): [facility] [phone]   Time of second page: [phone]  Responding MD:    Time MD responded:    INR and PT are decreasing. MD aware.

## 2014-09-08 NOTE — Progress Notes (Addendum)
RN called by lab with critical lab (INR=9.02; PT=74.1). RN paged [facility] [phone] twice with no phone call in return. INR decreased from 9.08 to 9.02 and PT from 74.5 to 74.1. Continue to monitor patient for bleeding. At this time, patient resting quietly in bed; no S/S of acute distress, no S/S of bleeding.

## 2014-09-08 NOTE — Progress Notes (Signed)
ANTICOAGULATION CONSULT NOTE - Follow Up Consult  Pharmacy Consult for coumadin Indication: mechanical AVR/MVR  No Known Allergies  Patient Measurements: Height: 6' (182.9 cm) Weight: 173 lb 8 oz (78.7 kg) IBW/kg (Calculated) : 77.6   Vital Signs: Temp: 98.6 F (37 C) (01/26 0525) Temp Source: Oral (01/26 0525) BP: 121/30 mmHg (01/26 0525) Pulse Rate: 72 (01/26 0525)  Labs:  Recent Labs  09/07/14 1655 09/08/14 0656  HGB 13.4 12.5*  HCT 39.1 37.2*  PLT 329 311  LABPROT 74.5* 74.1*  INR 9.08* 9.02*  CREATININE 1.08 1.08    Estimated Creatinine Clearance: 79.8 mL/min (by C-G formula based on Cr of 1.08).  Assessment: Patient is a 59 y.o M on coumadin PTA for mechanical AVR/MVR.  Patient reported he took a couple of extra doses of coumadin PTA since he missed a dose.  INR remains supratherapeutic at 9.02 this morning.  CBC is stable.  No bleeding documented.   Goal of Therapy:  INR 2.5-3.5    Plan:  - continue to hold coumadin today - f/u INR, monitor for S/S of bleeding  Devorah Givhan P 09/08/2014,9:46 AM

## 2014-09-08 NOTE — Progress Notes (Signed)
Subjective:  Patient denies any chest pain or shortness of breath. Denies any headache. Denies any obvious bleeding  Objective:  Vital Signs in the last 24 hours: Temp:  [97.9 F (36.6 C)-98.6 F (37 C)] 98.6 F (37 C) (01/26 0525) Pulse Rate:  [72-98] 72 (01/26 0525) Resp:  [15-18] 18 (01/26 0525) BP: (121-150)/(30-103) 121/30 mmHg (01/26 0525) SpO2:  [93 %-100 %] 98 % (01/26 0525) Weight:  [78.7 kg (173 lb 8 oz)] 78.7 kg (173 lb 8 oz) (01/25 2354)  Intake/Output from previous day:   Intake/Output from this shift: Total I/O In: 100 [P.O.:100] Out: -   Physical Exam: Neck: no adenopathy, no carotid bruit, no JVD and supple, symmetrical, trachea midline Lungs: clear to auscultation bilaterally Heart: regular rate and rhythm and Metallic heart sounds Abdomen: soft, non-tender; bowel sounds normal; no masses,  no organomegaly Extremities: extremities normal, atraumatic, no cyanosis or edema  Lab Results:  Recent Labs  09/07/14 1655 09/08/14 0656  WBC 6.1 5.6  HGB 13.4 12.5*  PLT 329 311    Recent Labs  09/07/14 1655 09/08/14 0656  NA 141 138  K 4.1 3.7  CL 108 105  CO2 25 25  GLUCOSE 90 95  BUN 14 13  CREATININE 1.08 1.08   No results for input(s): TROPONINI in the last 72 hours.  Invalid input(s): CK, MB Hepatic Function Panel  Recent Labs  09/08/14 0656  PROT 6.4  ALBUMIN 3.7  AST 26  ALT 21  ALKPHOS 87  BILITOT 0.6   No results for input(s): CHOL in the last 72 hours. No results for input(s): PROTIME in the last 72 hours.  Imaging: Imaging results have been reviewed and No results found.  Cardiac Studies:  Assessment/Plan:  Iatrogenic supratherapeutic INR History of rheumatic heart disease status post mechanical prosthetic aortic and mitral valve Hypertension History of CVA with ataxia in the past Plan Check PT/INR this evening and in a.m. May need vitamin K Will also start on aspirin once INR in therapeutic range.  LOS: 1 day     Onie Kasparek N 09/08/2014, 10:37 AM

## 2014-09-08 NOTE — Progress Notes (Signed)
Nutrition Brief Note  Patient identified on the Malnutrition Screening Tool (MST) Report  Wt Readings from Last 15 Encounters:  09/07/14 173 lb 8 oz (78.7 kg)  10/27/13 195 lb (88.451 kg)  09/02/13 196 lb (88.905 kg)  07/23/13 175 lb 3.2 oz (79.47 kg)  07/14/13 174 lb 13.2 oz (79.3 kg)  04/04/12 175 lb 11.3 oz (79.7 kg)    Body mass index is 23.53 kg/(m^2). Patient meets criteria for normal based on current BMI.   Current diet order is heart, patient is consuming approximately 100% of meals at this time. Pt reports appetite is great currently and PTA with no difficulties. Pt with weight loss, however pt was intentionally trying to lose weight. Labs and medications reviewed.   No nutrition interventions warranted at this time. If nutrition issues arise, please consult RD.   Kallie Locks, MS, RD, LDN Pager # [phone] After hours/ weekend pager # [phone]

## 2014-09-09 LAB — PROTIME-INR
INR: 4.98 — ABNORMAL HIGH (ref 0.00–1.49)
Prothrombin Time: 46.6 seconds — ABNORMAL HIGH (ref 11.6–15.2)

## 2014-09-09 NOTE — Progress Notes (Signed)
Subjective:  Patient denies any chest pain or shortness of breath.  Nor slowly trending down and obvious bleeding.  Objective:  Vital Signs in the last 24 hours: Temp:  [98.3 F (36.8 C)-98.7 F (37.1 C)] 98.7 F (37.1 C) (01/27 0534) Pulse Rate:  [85-86] 86 (01/27 0534) Resp:  [20] 20 (01/27 0534) BP: (130-148)/(81-93) 148/93 mmHg (01/27 0534) SpO2:  [98 %-99 %] 99 % (01/27 0534)  Intake/Output from previous day: 01/26 0701 - 01/27 0700 In: 280 [P.O.:280] Out: -  Intake/Output from this shift: Total I/O In: 60 [P.O.:60] Out: -   Physical Exam: Neck: no adenopathy, no carotid bruit, no JVD and supple, symmetrical, trachea midline Lungs: clear to auscultation bilaterally Heart: regular rate and rhythm, S1, S2 normal and metallic heart sounds Abdomen: soft, non-tender; bowel sounds normal; no masses,  no organomegaly Extremities: extremities normal, atraumatic, no cyanosis or edema  Lab Results:  Recent Labs  09/07/14 1655 09/08/14 0656  WBC 6.1 5.6  HGB 13.4 12.5*  PLT 329 311    Recent Labs  09/07/14 1655 09/08/14 0656  NA 141 138  K 4.1 3.7  CL 108 105  CO2 25 25  GLUCOSE 90 95  BUN 14 13  CREATININE 1.08 1.08   No results for input(s): TROPONINI in the last 72 hours.  Invalid input(s): CK, MB Hepatic Function Panel  Recent Labs  09/08/14 0656  PROT 6.4  ALBUMIN 3.7  AST 26  ALT 21  ALKPHOS 87  BILITOT 0.6   No results for input(s): CHOL in the last 72 hours. No results for input(s): PROTIME in the last 72 hours.  Imaging: Imaging results have been reviewed and No results found.  Cardiac Studies:  Assessment/Plan:  Iatrogenic supratherapeutic INR History of rheumatic heart disease status post mechanical prosthetic aortic and mitral valve Hypertension History of CVA with ataxia in the past Plan Continue present management Check labs in a.m. Possible discharge tomorrow if stable  LOS: 2 days    Jeff [name] N 09/09/2014, 2:10  PM

## 2014-09-09 NOTE — Progress Notes (Signed)
ANTICOAGULATION CONSULT NOTE - Follow Up Consult  Pharmacy Consult for coumadin Indication: mechanical AVR/MVR  No Known Allergies  Patient Measurements: Height: 6' (182.9 cm) Weight: 173 lb 8 oz (78.7 kg) IBW/kg (Calculated) : 77.6   Vital Signs: Temp: 98.7 F (37.1 C) (01/27 0534) Temp Source: Oral (01/27 0534) BP: 148/93 mmHg (01/27 0534) Pulse Rate: 86 (01/27 0534)  Labs:  Recent Labs  09/07/14 1655 09/08/14 0656 09/08/14 1818 09/09/14 0616  HGB 13.4 12.5*  --   --   HCT 39.1 37.2*  --   --   PLT 329 311  --   --   LABPROT 74.5* 74.1* 63.3* 46.6*  INR 9.08* 9.02* 7.37* 4.98*  CREATININE 1.08 1.08  --   --     Estimated Creatinine Clearance: 79.8 mL/min (by C-G formula based on Cr of 1.08).  Assessment: Patient is a 59 y.o M on coumadin PTA for mechanical AVR/MVR. Patient reported he took a couple of extra doses of coumadin PTA since he missed a dose. INR was 9.08 on admit but now down to 4.98 today (no reversal agent given).No bleeding documented.   Goal of Therapy:  INR 2.5-3.5   Plan:  - cont to hold coumadin   Shaylea Ucci P 09/09/2014,10:48 AM

## 2014-09-09 NOTE — Progress Notes (Signed)
UR completed 

## 2014-09-10 LAB — PROTIME-INR
INR: 2.33 — ABNORMAL HIGH (ref 0.00–1.49)
Prothrombin Time: 25.7 seconds — ABNORMAL HIGH (ref 11.6–15.2)

## 2014-09-10 MED ORDER — WARFARIN SODIUM 10 MG PO TABS
10.0000 mg | ORAL_TABLET | Freq: Once | ORAL | Status: DC
  Filled 2014-09-10: qty 1

## 2014-09-10 MED ORDER — NICOTINE 21 MG/24HR TD PT24: 21.0000 mg | MEDICATED_PATCH | Freq: Every day | TRANSDERMAL | Status: DC

## 2014-09-10 MED ORDER — ASPIRIN EC 81 MG PO TBEC: 81.0000 mg | DELAYED_RELEASE_TABLET | Freq: Every day | ORAL | Status: DC

## 2014-09-10 NOTE — Discharge Summary (Signed)
Discharge summary dictated on 09/10/2014 dictation number is [phone]

## 2014-09-10 NOTE — Progress Notes (Signed)
09/10/14 Patient to be discharged home, IV site removed, and discharge instructions reviewed with Patient and scripts given to patient to be refilled.

## 2014-09-10 NOTE — Discharge Instructions (Signed)
Information on my medicine - Coumadin   (Warfarin)  This medication education was reviewed with me or my healthcare representative as part of my discharge preparation.    Why was Coumadin prescribed for you? Coumadin was prescribed for you because you have a blood clot or a medical condition that can cause an increased risk of forming blood clots. Blood clots can cause serious health problems by blocking the flow of blood to the heart, lung, or brain. Coumadin can prevent harmful blood clots from forming. As a reminder your indication for Coumadin is:   Select from menumechanical heart valve What test will check on my response to Coumadin? While on Coumadin (warfarin) you will need to have an INR test regularly to ensure that your dose is keeping you in the desired range. The INR (international normalized ratio) number is calculated from the result of the laboratory test called prothrombin time (PT).  If an INR APPOINTMENT HAS NOT ALREADY BEEN MADE FOR YOU please schedule an appointment to have this lab work done by your health care provider within 7 days. Your INR goal is usually a number between:  2 to 3 or your provider may give you a more narrow range like 2-2.5.  Ask your health care provider during an office visit what your goal INR is.  What  do you need to  know  About  COUMADIN? Take Coumadin (warfarin) exactly as prescribed by your healthcare provider about the same time each day.  DO NOT stop taking without talking to the doctor who prescribed the medication.  Stopping without other blood clot prevention medication to take the place of Coumadin may increase your risk of developing a new clot or stroke.  Get refills before you run out.  What do you do if you miss a dose? If you miss a dose, take it as soon as you remember on the same day then continue your regularly scheduled regimen the next day.  Do not take two doses of Coumadin at the same time.  Important Safety Information A  possible side effect of Coumadin (Warfarin) is an increased risk of bleeding. You should call your healthcare provider right away if you experience any of the following: ? Bleeding from an injury or your nose that does not stop. ? Unusual colored urine (red or dark brown) or unusual colored stools (red or black). ? Unusual bruising for unknown reasons. ? A serious fall or if you hit your head (even if there is no bleeding).  Some foods or medicines interact with Coumadin (warfarin) and might alter your response to warfarin. To help avoid this: ? Eat a balanced diet, maintaining a consistent amount of Vitamin K. ? Notify your provider about major diet changes you plan to make. ? Avoid alcohol or limit your intake to 1 drink for women and 2 drinks for men per day. (1 drink is 5 oz. wine, 12 oz. beer, or 1.5 oz. liquor.)  Make sure that ANY health care provider who prescribes medication for you knows that you are taking Coumadin (warfarin).  Also make sure the healthcare provider who is monitoring your Coumadin knows when you have started a new medication including herbals and non-prescription products.  Coumadin (Warfarin)  Major Drug Interactions  Increased Warfarin Effect Decreased Warfarin Effect  Alcohol (large quantities) Antibiotics (esp. Septra/Bactrim, Flagyl, Cipro) Amiodarone (Cordarone) Aspirin (ASA) Cimetidine (Tagamet) Megestrol (Megace) NSAIDs (ibuprofen, naproxen, etc.) Piroxicam (Feldene) Propafenone (Rythmol SR) Propranolol (Inderal) Isoniazid (INH) Posaconazole (Noxafil) Barbiturates (Phenobarbital) Carbamazepine (Tegretol)  Chlordiazepoxide (Librium) Cholestyramine (Questran) Griseofulvin Oral Contraceptives Rifampin Sucralfate (Carafate) Vitamin K   Coumadin (Warfarin) Major Herbal Interactions  Increased Warfarin Effect Decreased Warfarin Effect  Garlic Ginseng Ginkgo biloba Coenzyme Q10 Green tea St. Johns wort    Coumadin (Warfarin) FOOD  Interactions  Eat a consistent number of servings per week of foods HIGH in Vitamin K (1 serving =  cup)  Collards (cooked, or boiled & drained) Kale (cooked, or boiled & drained) Mustard greens (cooked, or boiled & drained) Parsley *serving size only =  cup Spinach (cooked, or boiled & drained) Swiss chard (cooked, or boiled & drained) Turnip greens (cooked, or boiled & drained)  Eat a consistent number of servings per week of foods MEDIUM-HIGH in Vitamin K (1 serving = 1 cup)  Asparagus (cooked, or boiled & drained) Broccoli (cooked, boiled & drained, or raw & chopped) Brussel sprouts (cooked, or boiled & drained) *serving size only =  cup Lettuce, raw (green leaf, endive, romaine) Spinach, raw Turnip greens, raw & chopped   These websites have more information on Coumadin (warfarin):  FailFactory.se; VeganReport.com.au;   Anticoagulation, Generic Anticoagulants are medicines used to prevent clots from developing in your veins. These medicine are also known as blood thinners. If blood clots are untreated, they could travel to your lungs. This is called a pulmonary embolus. A blood clot in your lungs can be fatal.  Health care providers often use anticoagulants to prevent clots following surgery. Anticoagulants are also used along with aspirin when the heart is not getting enough blood. Another anticoagulant called warfarin is started 2 to 3 days after a rapid-acting injectable anticoagulant is started. The rapid-acting anticoagulants are usually continued until warfarin has begun to work. Your health care provider will judge this length of time by blood tests known as the prothrombin time (PT) and International Normalization Ratio (INR). This means that your blood is at the necessary and best level to prevent clots. RISKS AND COMPLICATIONS  If you have received recent epidural anesthesia, spinal anesthesia, or a spinal tap while receiving anticoagulants, you are at  risk for developing a blood clot in or around the spine. This condition could result in long-term or permanent paralysis.  Because anticoagulants thin your blood, severe bleeding may occur from any tissue or organ. Symptoms of the blood being too thin may include:  Bleeding from the nose or gums that does not stop quickly.  Blood in bowel movements which may appear as bright red, dark, or black tarry stools.  Blood in the urine which may appear as pink, red, or brown urine.  Unusual bruising or bruising easily.  A cut that does not stop bleeding within 10 minutes.  Vomiting blood or continuous nausea for more than 1 day.  Coughing up blood.  Broken blood vessels in your eye (subconjunctival hemorrhage).  Abdominal or back pain with or without flank bruising.  Sudden, severe headache.  Sudden weakness or numbness of the face, arm, or leg, especially on one side of the body.  Sudden confusion.  Trouble speaking (aphasia) or understanding.  Sudden trouble seeing in one or both eyes.  Sudden trouble walking.  Dizziness.  Loss of balance or coordination.  Vaginal bleeding.  Swelling or pain at an injection site.  Superficial fat tissue death (necrosis) which may cause skin scarring. This is more common in women and may first present as pain in the waist, thighs, or buttocks.  Fever.  Too little anticoagulation continues to allow the risk for blood clots.  HOME CARE INSTRUCTIONS   Due to the complications of anticoagulants, it is very important that you take your anticoagulant as directed by your health care provider. Anticoagulants need to be taken exactly as instructed. Be sure you understand all your anticoagulant instructions.  Keep all follow-up appointments with your health care provider as directed. It is very important to keep your appointments. Not keeping appointments could result in a chronic or permanent injury, pain, or disability.  Warfarin. Your health  care provider will advise you on the length of treatment (usually 3-6 months, sometimes lifelong).  Take warfarin exactly as directed by your health care provider. It is recommended that you take your warfarin dose at the same time of the day. It is preferred that you take warfarin in the late afternoon. If you have been told to stop taking warfarin, do not resume taking warfarin until directed to do so by your health care provider. Follow your health care provider's instructions if you accidentally take an extra dose or miss a dose of warfarin. It is very important to take warfarin as directed since bleeding or blood clots could result in chronic or permanent injury, pain, or disability.  Too much and too little warfarin are both dangerous. Too much warfarin increases the risk of bleeding. Too little warfarin continues to allow the risk for blood clots. While taking warfarin, you will need to have regular blood tests to measure your blood clotting time. These blood tests usually include both the prothrombin time (PT) and International Normalized Ratio (INR) tests. The PT and INR results allow your health care provider to adjust your dose of warfarin. The dose can change for many reasons. It is critically important that you have your PT and INR levels drawn exactly as directed. Your warfarin dose may stay the same or change depending on what the PT and INR results are. Be sure to follow up with your health care provider regarding your PT and INR test results and what your warfarin dosage should be.  Many medicines can interfere with warfarin and affect the PT and INR results. You must tell your health care provider about any and all medicines you take, this includes all vitamins and supplements. Ask your health care provider before taking these. Prescription and over-the-counter medicine consistency is critical to warfarin management. It is important that potential interactions are checked before you start a  new medicine. Be especially cautious with aspirin and anti-inflammatory medicines. Ask your health care provider before taking these. Medicines such as antibiotics and acid-reducing medicine can interact with warfarin and can cause an increased warfarin effect. Warfarin can also interfere with the effectiveness of medicines you are taking. Do not take or discontinue any prescribed or over-the-counter medicine except on the advice of your health care provider or pharmacist.  Some vitamins, supplements, and herbal products interfere with the effectiveness of warfarin. Vitamin E may increase the anticoagulant effects of warfarin. Vitamin K may can cause warfarin to be less effective. Do not take or discontinue any vitamin, supplement, or herbal product except on the advice of your health care provider or pharmacist.  Eat what you normally eat and keep the vitamin K content of your diet consistent. Avoid major changes in your diet, or notify your health care provider before changing your diet. Suddenly getting a lot more vitamin K could cause your blood to clot too quickly. A sudden decrease in vitamin K intake could cause your blood to clot too slowly. These changes in vitamin K  intake could lead to dangerous blood clotsor to bleeding. To keep your vitamin K intake consistent, you must be aware of which foods contain moderate or high amounts of vitamin K. Some foods high in vitamin K include spinach, kale, broccoli, cabbage, greens, Brussels sprouts, asparagus, Bok [place]oy, coleslaw, parsley, and green tea. Arrange a visit with a dietitian to answer your questions.  If you have a loss of appetite or get the stomach flu (viral gastroenteritis), talk to your health care provider as soon as possible. A decrease in your normal vitamin K intake can make you more sensitive to your usual dose of warfarin.  Some medical conditions may increase your risk for bleeding while you are taking warfarin. A fever, diarrhea  lasting more than a day, worsening heart failure, or worsening liver function are some medical conditions that could affect warfarin. Contact your health care provider if you have any of these medical conditions.  Alcohol can change the body's ability to handle warfarin. It is best to avoid alcoholic drinks or consume only very small amounts while taking warfarin. Notify your health care provider if you change your alcohol intake. A sudden increase in alcohol use can increase your risk of bleeding. Chronic alcohol use can cause warfarin to be less effective.  Be careful not to cut yourself when using sharp objects or while shaving.  Inform all your health care providers and your dentist that you take an anticoagulant.  Limit physical activities or sports that could result in a fall or cause injury. Avoid contact sports.  Wear medical alert jewelry or carry a medical alert card. SEEK IMMEDIATE MEDICAL CARE IF:  You cough up blood.  You have dark or black stools or there is bright red blood coming from your rectum.  You vomit blood or have nausea for more than 1 day.  You have blood in the urine or pink colored urine.  You have unusual bruising or have increased bruising.  You have bleeding from the nose or gums that does not stop quickly.  You have a cut that does not stop bleeding within a 2-3 minutes.  You have sudden weakness or numbness of the face, arm, or leg, especially on one side of the body.  You have sudden confusion.  You have trouble speaking (aphasia) or understanding.  You have sudden trouble seeing in one or both eyes.  You have sudden trouble walking.  You have dizziness.  You have a loss of balance or coordination.  You have a sudden, severe headache.  You have a serious fall or head injury, even if you are not bleeding.  You have swelling or pain at an injection site.  You have unexplained tenderness or pain in the abdomen, back, waist, thighs or  buttocks.  You have a fever. Any of these symptoms may represent a serious problem that is an emergency. Do not wait to see if the symptoms will go away. Get medical help right away. Call your local emergency services (911 in [place] Do not drive yourself to the hospital. Document Released: 07/31/2005 Document Revised: 08/05/2013 Document Reviewed: 03/04/2008 [facility] Patient Information 2015 [place], [place]. This information is not intended to replace advice given to you by your health care provider. Make sure you discuss any questions you have with your health care provider.

## 2014-09-10 NOTE — Progress Notes (Signed)
ANTICOAGULATION CONSULT NOTE - Follow Up Consult  Pharmacy Consult for coumadin Indication: mechanical AVR/MVR  No Known Allergies  Patient Measurements: Height: 6' (182.9 cm) Weight: 173 lb 8 oz (78.7 kg) IBW/kg (Calculated) : 77.6  Vital Signs: Temp: 98.3 F (36.8 C) (01/28 0503) Temp Source: Oral (01/28 0503) BP: 124/90 mmHg (01/28 0503) Pulse Rate: 89 (01/28 0503)  Labs:  Recent Labs  09/07/14 1655 09/08/14 0656 09/08/14 1818 09/09/14 0616 09/10/14 0808  HGB 13.4 12.5*  --   --   --   HCT 39.1 37.2*  --   --   --   PLT 329 311  --   --   --   LABPROT 74.5* 74.1* 63.3* 46.6* 25.7*  INR 9.08* 9.02* 7.37* 4.98* 2.33*  CREATININE 1.08 1.08  --   --   --     Estimated Creatinine Clearance: 79.8 mL/min (by C-G formula based on Cr of 1.08).  Assessment: Patient is a 59 y.o M on coumadin PTA for mechanical AVR/MVR. Patient reported he took a couple of extra doses of coumadin PTA since he missed a dose. INR was 9.08 on admit but now down to 2.33 today (no reversal agent given).No bleeding documented.  Goal of Therapy:  INR 2.5-3.5   Plan:  - coumadin 10mg  PO x1 today  Latrell Reitan P 09/10/2014,11:22 AM

## 2014-09-11 NOTE — Discharge Summary (Signed)
NAMEXANDER, JUTRAS NO.:  [id number]  MEDICAL RECORD NO.:  [id number]  LOCATION:  5W21C                        FACILITY:  [facility]  PHYSICIAN:  Allegra Lai. Terrence Dupont, M.D. DATE OF BIRTH:  1955-08-25  DATE OF ADMISSION:  09/07/2014 DATE OF DISCHARGE:  09/10/2014                              DISCHARGE SUMMARY   ADMITTING DIAGNOSES: 1. Supratherapeutic INR, iatrogenic. 2. History of rheumatic heart disease status post mechanical aortic     valve replacement and mitral valve replacement in the past. 3. History of cerebrovascular accident. 4. Hypertension.  DISCHARGE DIAGNOSES: 1. Status post supratherapeutic INR, iatrogenic, now back in     therapeutic range. 2. Rheumatic heart disease status post mechanical aortic valve and     mitral valve replacement in the past. 3. History of cerebrovascular accident with ataxia, stable. 4. Hypertension.  DISCHARGE HOME MEDICATIONS: 1. Aspirin 81 mg 1 tablet daily. 2. Nicotine patch 21 mg per 24 hours daily. 3. Hydrochlorothiazide 12.5 mg 1 tablet daily. 4. Coumadin 10 mg 1 tablet daily.  DIET:  Low salt, low cholesterol.  ACTIVITY:  As tolerated.  FOLLOWUP:  With Dr. Montez Morita in 2 weeks.  CONDITION AT DISCHARGE:  Stable.  The patient has appointment to see PCP early next week and will get PT/INR done.  CONDITION ON DISCHARGE:  Stable.  BRIEF HISTORY AND HOSPITAL COURSE:  Mr. Fairbank is a 59 year old male with past medical history significant for rheumatic heart disease status post mechanical aortic and mitral valve on chronic Coumadin, history of cerebellar CVA with ataxia, he came to the ER because patient was told his INR was very high.  In the ER, patient's INR was found around 9. The patient did not have any bleeding or any ecchymoses.  Denies any headache.  The patient, on walking, was found to have unsteady gait due to previous CVA and given his elevated INR at this time, the patient was admitted for further  observation as patient was high risk for fall.  The patient denies any chest pain or shortness of breath.  PAST MEDICAL HISTORY:  As above.  PHYSICAL EXAMINATION:  GENERAL:  He was alert, awake, oriented x3. Hemodynamically, stable. HEENT:  Eyes, no paler. NECK:  Supple.  No mass. CARDIOVASCULAR:  S1, S2 was normal.  There was mechanical heart sounds. LUNGS:  Clear to auscultation. ABDOMEN:  Soft.  Bowel sounds were present.  Nontender. EXTREMITIES:  There was no clubbing, cyanosis, or edema. NEUROLOGIC:  Grossly intact.  LABORATORY DATA:  Sodium was 141, potassium 4.1, BUN 14, creatinine 1.08, hemoglobin was 13.4, hematocrit 39.1, white count of 6.1.  His PT was 74.5, INR 9.08.  Repeat PT 74.1, INR 9.02.  Yesterday, PT was 63.3, INR 7.37 which is trending down.  Repeat PT/INR yesterday was in the evening 46.6, INR 4.98.  Today, PT is 25.7 and INR of 2.33.  BRIEF HOSPITAL COURSE:  The patient was admitted for observation.  His PT/INR is gradually trending down to back to therapeutic range.  The patient is ambulating in room without any problems.  The patient will be discharged home on above medications and will be followed up by Dr. Montez Morita in 2  weeks and his PMD as scheduled.  The patient has been advised to check his PT/INR early next week, and has been advised to refrain from doubling the dose of Coumadin on his own.     Allegra Lai. Terrence Dupont, M.D.     MNH/MEDQ  D:  09/10/2014  T:  09/11/2014  Job:  [id number]

## 2014-12-24 ENCOUNTER — Ambulatory Visit: Payer: Medicaid Other | Attending: Internal Medicine | Admitting: Physical Therapy

## 2014-12-24 DIAGNOSIS — R270 Ataxia, unspecified: Secondary | ICD-10-CM

## 2014-12-24 DIAGNOSIS — M6281 Muscle weakness (generalized): Secondary | ICD-10-CM

## 2014-12-24 DIAGNOSIS — R269 Unspecified abnormalities of gait and mobility: Secondary | ICD-10-CM

## 2014-12-24 NOTE — Patient Instructions (Signed)
"  I love a Database administrator   Using a chair if necessary, march in place 10-20 times as high as you can. Repeat __3__ times.   http://gt2.exer.us/344   Copyright  VHI. All rights reserved.  Toe / Heel Raise   Gently rock back on heels and raise toes. Then rock forward on toes and raise heels.  Hold each position for 3 seconds Repeat sequence __20__ times per session. Do __1__ sessions per day.  Copyright  VHI. All rights reserved.  Side-Stepping   Walk to left side with eyes open. Take even steps, leading with same foot. Make sure each foot lifts off the floor. Repeat in opposite direction. Repeat for __3__ minutes per session. Do _1-2___ sessions per day.   Copyright  VHI. All rights reserved.  SINGLE LIMB STANCE   Stance: single leg on floor. Raise leg. Hold __10_ seconds. Repeat with other leg. __3_ reps per set, 1-2___ sets per day  Copyright  VHI. All rights reserved.

## 2014-12-25 NOTE — Therapy (Signed)
[facility] [address] [place], [place], [address] Phone: [phone]   Fax:  [phone]  Physical Therapy Evaluation  Patient Details  Name: Jeff [name] MRN: [id number] Date of Birth: 1955/11/09 Referring Provider:  Rogers Blocker, MD  Encounter Date: 12/24/2014      PT End of Session - 12/25/14 0926    Visit Number 1   Number of Visits 4   Date for PT Re-Evaluation 02/22/15   Authorization Type Medicaid-awaiting authorization   PT Start Time 1318   PT Stop Time 1400   PT Time Calculation (min) 42 min   Equipment Utilized During Treatment Gait belt   Activity Tolerance Patient tolerated treatment well   Behavior During Therapy [facility] for tasks assessed/performed      Past Medical History  Diagnosis Date  . History of CVA (cerebrovascular accident)     02/2011  . Hx of aortic valve replacement, mechanical   . H/O mitral valve replacement with mechanical valve   . HX: long term anticoagulant use     Past Surgical History  Procedure Laterality Date  . Cardiac valve replacement      There were no vitals filed for this visit.  Visit Diagnosis:  Muscle weakness  Ataxia  Abnormality of gait      Subjective Assessment - 12/24/14 1321    Subjective Pt is a 59 year old male with history of CVA with L sided weakness in July 10, 2013.  Pt experiences weakness LLE and balance difficulty.  He has had one fall in the past 6 months.  He presents with cane, which he uses for his primary means of mobility.   Patient Stated Goals Pt's goal for therapy is to walk better, so that he is more independent.   Currently in Pain? No/denies            [facility] PT Assessment - 12/25/14 1156    Assessment   Medical Diagnosis CVA with L sided weakness   Onset Date 07/10/13   Precautions   Precautions Fall   Balance Screen   Has the patient fallen in the past 6 months Yes   How many times? 1   Has the patient had a  decrease in activity level because of a fear of falling?  No   Is the patient reluctant to leave their home because of a fear of falling?  No   Home Environment   Living Enviornment Private residence   Living Arrangements Alone   Available Help at Discharge Personal care attendant  Aide every morning   Type of [place] Access Other (comment)  4 inch step to get in   [place] - single point;Walker - 2 wheels;Shower seat;Grab bars - tub/shower   Prior Function   Level of Independence Independent with basic ADLs;Independent with gait;Independent with transfers   Observation/Other Assessments   Focus on Therapeutic Outcomes (FOTO)  Functional Intake score 70; Stroke IS mobility score 63.9%   ROM / Strength   AROM / PROM / Strength Strength   Strength   Overall Strength Comments grossly tested at least 4/5 bilaterally   Ambulation/Gait   Ambulation/Gait Yes   Ambulation/Gait Assistance 5: Supervision   Ambulation Distance (Feet) 200 Feet   Assistive device Straight cane   Gait Pattern Decreased stance time - left;Decreased step length - right;Decreased step length - left;Decreased trunk rotation  decr. L arm swing   Gait  velocity 13.02 sec=2.52 ft/sec   Balance   Balance Assessed Yes   Standardized Balance Assessment   Standardized Balance Assessment Timed Up and Go Test;Dynamic Gait Index   Dynamic Gait Index   Level Surface Moderate Impairment   Change in Gait Speed Mild Impairment   Gait with Horizontal Head Turns Mild Impairment   Gait with Vertical Head Turns Moderate Impairment   Gait and Pivot Turn Mild Impairment   Step Over Obstacle Mild Impairment   Step Around Obstacles Mild Impairment   Steps Moderate Impairment   Total Score 13   Timed Up and Go Test   TUG Normal TUG   Normal TUG (seconds) 21.22                                PT Long Term Goals - 12/25/14 1138    PT LONG TERM GOAL #1    Title Pt will be independent with HEP for strengthening and balance.   Baseline No current HEP   Time 4   Period Weeks   Status New   PT LONG TERM GOAL #2   Title Pt will improve Timed UP and Go score to less than or equal to 15 seconds for decreased fall risk.   Baseline TUG score 21.22 seconds-(>13.5 seconds indicates increased fall risk.)   Time 4   Period Weeks   Status New   PT LONG TERM GOAL #3   Title Pt will improve gait velocity to at least 2.62 ft/sec for improved gait efficiency and safety.   Baseline gait velocity 2.52 ft/sec-(limited community ambulator gait velocity 1.31-2.62 ft/sec)   Time 4   Period Weeks   Status New   PT LONG TERM GOAL #4   Title Pt will verbalize understanding of continued community fitness upon DC from PT.   Baseline No current community fitness routine.   Time 4   Period Weeks   Status New   PT LONG TERM GOAL #5   Title Pt will verbalize understanding of fall prevention within the home environment.   Baseline Pt at fall risk per TUG and Dynamic Gait Index score of 13/24   Time 4   Period Weeks   Status New               Plan - 12/25/14 1143    Clinical Impression Statement Pt is a 59 year old male who presents to OP PT with history of cerebral thrombosis with stroke (434.01) and L sided weakness.  Pt's CVA was 07/10/2013, resulting in ataxic movements of LLE (438.84) and L sided weakness.  Pt feels his blood pressure is under control and has not had any hospitalizations for cardiovascular reasons since 2014.  He presents to OP PT today because he is noticing additional weakness and some diffculty coordinating and moving LLE with gait.  He is at fall risk per Dynamic Gait score of 13/24  and Timed UP and Go score of 21.22 sec.  He is noted to have decreased gait speed of 2.52 ft/sec.  Pt would benefit from skilled PT to address L sided weakness and ataxic movements for improved balance and functional mobility and decreased fall risk.   Pt  will benefit from skilled therapeutic intervention in order to improve on the following deficits Abnormal gait;Decreased balance;Decreased mobility;Decreased strength;Difficulty walking   Rehab Potential Good   PT Frequency 1x / week   PT Duration --  for 3 visits  over 4 weeks   PT Treatment/Interventions Functional mobility training;Gait training;Therapeutic activities;Therapeutic exercise;Balance training;Neuromuscular re-education;Patient/family education   PT Next Visit Plan Initiate/review HEP; discuss community fitness options   PT Home Exercise Plan Balance, strengthening exercises for HEP   Consulted and Agree with Plan of Care Patient         Problem List Patient Active Problem List   Diagnosis Date Noted  . Supratherapeutic INR 09/07/2014  . CVA (cerebral infarction) 07/16/2013  . Cerebellar stroke, acute 07/13/2013  . Ataxia 07/12/2013  . Vomiting 07/10/2013  . Nausea with vomiting 07/10/2013  . Subtherapeutic international normalized ratio (INR) 07/10/2013  . AKI (acute kidney injury) 07/10/2013  . Volume depletion 07/10/2013  . Nausea & vomiting 07/10/2013  . Cellulitis of left upper arm and forearm 04/04/2012  . History of CVA (cerebrovascular accident)   . Hx of aortic valve replacement, mechanical   . H/O mitral valve replacement with mechanical valve   . HX: long term anticoagulant use     Surena Welge W. 12/25/2014, 11:56 AM  Frazier Butt., PT  [facility] [address] [place], [place], [address] Phone: [phone]   Fax:  [phone]

## 2015-03-19 ENCOUNTER — Emergency Department (HOSPITAL_COMMUNITY): Payer: Medicaid Other

## 2015-03-19 ENCOUNTER — Encounter (HOSPITAL_COMMUNITY): Payer: Self-pay | Admitting: *Deleted

## 2015-03-19 ENCOUNTER — Inpatient Hospital Stay (HOSPITAL_COMMUNITY)
Admission: EM | Admit: 2015-03-19 | Discharge: 2015-03-23 | DRG: 065 | Disposition: A | Discharge status: Home / Self Care | Payer: Medicaid Other | Attending: Cardiovascular Disease | Admitting: Cardiovascular Disease

## 2015-03-19 DIAGNOSIS — E785 Hyperlipidemia, unspecified: Secondary | ICD-10-CM | POA: Diagnosis present

## 2015-03-19 DIAGNOSIS — Z79899 Other long term (current) drug therapy: Secondary | ICD-10-CM | POA: Diagnosis not present

## 2015-03-19 DIAGNOSIS — I69393 Ataxia following cerebral infarction: Secondary | ICD-10-CM

## 2015-03-19 DIAGNOSIS — G459 Transient cerebral ischemic attack, unspecified: Secondary | ICD-10-CM | POA: Diagnosis not present

## 2015-03-19 DIAGNOSIS — D649 Anemia, unspecified: Secondary | ICD-10-CM | POA: Diagnosis present

## 2015-03-19 DIAGNOSIS — R0602 Shortness of breath: Secondary | ICD-10-CM

## 2015-03-19 DIAGNOSIS — Z7901 Long term (current) use of anticoagulants: Secondary | ICD-10-CM

## 2015-03-19 DIAGNOSIS — R4781 Slurred speech: Secondary | ICD-10-CM | POA: Diagnosis present

## 2015-03-19 DIAGNOSIS — I639 Cerebral infarction, unspecified: Secondary | ICD-10-CM | POA: Diagnosis present

## 2015-03-19 DIAGNOSIS — F1290 Cannabis use, unspecified, uncomplicated: Secondary | ICD-10-CM | POA: Diagnosis present

## 2015-03-19 DIAGNOSIS — I6350 Cerebral infarction due to unspecified occlusion or stenosis of unspecified cerebral artery: Secondary | ICD-10-CM

## 2015-03-19 DIAGNOSIS — G450 Vertebro-basilar artery syndrome: Secondary | ICD-10-CM | POA: Diagnosis not present

## 2015-03-19 DIAGNOSIS — I10 Essential (primary) hypertension: Secondary | ICD-10-CM | POA: Diagnosis present

## 2015-03-19 DIAGNOSIS — I69354 Hemiplegia and hemiparesis following cerebral infarction affecting left non-dominant side: Secondary | ICD-10-CM

## 2015-03-19 DIAGNOSIS — F1099 Alcohol use, unspecified with unspecified alcohol-induced disorder: Secondary | ICD-10-CM | POA: Diagnosis present

## 2015-03-19 DIAGNOSIS — Z952 Presence of prosthetic heart valve: Secondary | ICD-10-CM

## 2015-03-19 DIAGNOSIS — Z720 Tobacco use: Secondary | ICD-10-CM | POA: Diagnosis not present

## 2015-03-19 LAB — COMPREHENSIVE METABOLIC PANEL
ALT: 19 U/L (ref 17–63)
AST: 25 U/L (ref 15–41)
Albumin: 4.3 g/dL (ref 3.5–5.0)
Alkaline Phosphatase: 74 U/L (ref 38–126)
Anion gap: 8 (ref 5–15)
BILIRUBIN TOTAL: 0.5 mg/dL (ref 0.3–1.2)
BUN: 18 mg/dL (ref 6–20)
CALCIUM: 9.6 mg/dL (ref 8.9–10.3)
CHLORIDE: 103 mmol/L (ref 101–111)
CO2: 25 mmol/L (ref 22–32)
Creatinine, Ser: 1.22 mg/dL (ref 0.61–1.24)
GFR calc Af Amer: >60 mL/min (ref >60)
Glucose, Bld: 102 mg/dL — ABNORMAL HIGH (ref 65–99)
Potassium: 4.1 mmol/L (ref 3.5–5.1)
Sodium: 136 mmol/L (ref 135–145)
Total Protein: 7.4 g/dL (ref 6.5–8.1)

## 2015-03-19 LAB — I-STAT CHEM 8, ED
BUN: 22 mg/dL — ABNORMAL HIGH (ref 6–20)
Calcium, Ion: 1.22 mmol/L (ref 1.13–1.30)
Chloride: 102 mmol/L (ref 101–111)
Creatinine, Ser: 1.2 mg/dL (ref 0.61–1.24)
Glucose, Bld: 100 mg/dL — ABNORMAL HIGH (ref 65–99)
HCT: 40 % (ref 39.0–52.0)
Hemoglobin: 13.6 g/dL (ref 13.0–17.0)
Potassium: 4.1 mmol/L (ref 3.5–5.1)
SODIUM: 139 mmol/L (ref 135–145)
TCO2: 22 mmol/L (ref 0–100)

## 2015-03-19 LAB — CBC
HCT: 35.1 % — ABNORMAL LOW (ref 39.0–52.0)
HCT: 34.4 % — ABNORMAL LOW (ref 39.0–52.0)
HEMOGLOBIN: 11.9 g/dL — AB (ref 13.0–17.0)
HEMOGLOBIN: 11.6 g/dL — AB (ref 13.0–17.0)
MCH: 31.6 pg (ref 26.0–34.0)
MCH: 31.4 pg (ref 26.0–34.0)
MCHC: 33.9 g/dL (ref 30.0–36.0)
MCHC: 33.7 g/dL (ref 30.0–36.0)
MCV: 93.4 fL (ref 78.0–100.0)
MCV: 93 fL (ref 78.0–100.0)
Platelets: 300 10*3/uL (ref 150–400)
Platelets: 289 10*3/uL (ref 150–400)
RBC: 3.76 MIL/uL — ABNORMAL LOW (ref 4.22–5.81)
RBC: 3.7 MIL/uL — ABNORMAL LOW (ref 4.22–5.81)
RDW: 12.7 % (ref 11.5–15.5)
RDW: 12.5 % (ref 11.5–15.5)
WBC: 6.7 10*3/uL (ref 4.0–10.5)
WBC: 5.9 10*3/uL (ref 4.0–10.5)

## 2015-03-19 LAB — PROTIME-INR
INR: 1.29 (ref 0.00–1.49)
PROTHROMBIN TIME: 16.2 s — AB (ref 11.6–15.2)

## 2015-03-19 LAB — APTT: APTT: 28 s (ref 24–37)

## 2015-03-19 LAB — DIFFERENTIAL
Basophils Absolute: 0 10*3/uL (ref 0.0–0.1)
Basophils Relative: 0 % (ref 0–1)
EOS PCT: 1 % (ref 0–5)
Eosinophils Absolute: 0.1 10*3/uL (ref 0.0–0.7)
Lymphocytes Relative: 22 % (ref 12–46)
Lymphs Abs: 1.5 10*3/uL (ref 0.7–4.0)
Monocytes Absolute: 0.5 10*3/uL (ref 0.1–1.0)
Monocytes Relative: 7 % (ref 3–12)
Neutro Abs: 4.6 10*3/uL (ref 1.7–7.7)
Neutrophils Relative %: 70 % (ref 43–77)

## 2015-03-19 LAB — I-STAT TROPONIN, ED: Troponin i, poc: 0 ng/mL (ref 0.00–0.08)

## 2015-03-19 MED ORDER — HYDROCHLOROTHIAZIDE 25 MG PO TABS
25.0000 mg | ORAL_TABLET | Freq: Every day | ORAL | Status: DC
  Administered 2015-03-20 – 2015-03-23 (×4): 25 mg via ORAL
  Filled 2015-03-19 (×4): qty 1

## 2015-03-19 MED ORDER — NICOTINE 21 MG/24HR TD PT24
21.0000 mg | MEDICATED_PATCH | Freq: Every day | TRANSDERMAL | Status: DC
  Administered 2015-03-20 – 2015-03-23 (×5): 21 mg via TRANSDERMAL
  Filled 2015-03-19 (×5): qty 1

## 2015-03-19 MED ORDER — WARFARIN SODIUM 7.5 MG PO TABS
15.0000 mg | ORAL_TABLET | Freq: Every day | ORAL | Status: DC
  Administered 2015-03-19 – 2015-03-21 (×3): 15 mg via ORAL
  Filled 2015-03-19 (×3): qty 2

## 2015-03-19 MED ORDER — LISINOPRIL 20 MG PO TABS
20.0000 mg | ORAL_TABLET | Freq: Every day | ORAL | Status: DC
  Administered 2015-03-20 – 2015-03-23 (×4): 20 mg via ORAL
  Filled 2015-03-19 (×4): qty 1

## 2015-03-19 MED ORDER — SENNOSIDES-DOCUSATE SODIUM 8.6-50 MG PO TABS: 1.0000 | ORAL_TABLET | Freq: Every evening | ORAL | Status: DC | PRN

## 2015-03-19 MED ORDER — STROKE: EARLY STAGES OF RECOVERY BOOK
Freq: Once | Status: AC
  Administered 2015-03-19: 1

## 2015-03-19 MED ORDER — WARFARIN - PHYSICIAN DOSING INPATIENT
Freq: Every day | Status: DC
  Administered 2015-03-23: 18:00:00

## 2015-03-19 MED ORDER — HEPARIN (PORCINE) IN NACL 100-0.45 UNIT/ML-% IJ SOLN
1000.0000 [IU]/h | INTRAMUSCULAR | Status: DC
  Administered 2015-03-19: 1150 [IU]/h via INTRAVENOUS
  Administered 2015-03-20: 1250 [IU]/h via INTRAVENOUS
  Administered 2015-03-21: 1000 [IU]/h via INTRAVENOUS
  Filled 2015-03-19 (×3): qty 250

## 2015-03-19 MED ORDER — AMLODIPINE BESYLATE 5 MG PO TABS
5.0000 mg | ORAL_TABLET | Freq: Every day | ORAL | Status: DC
  Administered 2015-03-20 – 2015-03-23 (×4): 5 mg via ORAL
  Filled 2015-03-19 (×4): qty 1

## 2015-03-19 NOTE — Consult Note (Signed)
Referring Physician: Doylene Canard    Chief Complaint: Slurred speech and RUE weakness  HPI: Jeff [name] is an 59 y.o. male who reports that he was walking to the ATM this morning and lost control of his cane that he usually holds in his right hand.  When he attempted to pick it up he was unable to because he could nt control his right hand.  He went to the store and was unable to open his wallet again because of his right hand.  When he attempted to speak his speech was slurred.  The patient presented at that time.  Symptoms have reoslved.  Patient on Coumadin due to aortic and mitral valve replacement but INR is normal today.   Date last known well: Date: 03/19/2015 Time last known well: Time: 07:00 tPA Given: No: Resolution of symptoms  Past Medical History  Diagnosis Date  . History of CVA (cerebrovascular accident)     02/2011  . Hx of aortic valve replacement, mechanical   . H/O mitral valve replacement with mechanical valve   . HX: long term anticoagulant use     Past Surgical History  Procedure Laterality Date  . Cardiac valve replacement      Family History  Problem Relation Age of Onset  . Diabetes Mother     Died before her 77 birthday   Social History:  reports that he has been smoking Cigarettes.  He has been smoking about 0.05 packs per day. He does not have any smokeless tobacco history on file. He reports that he drinks about 8.4 oz of alcohol per week. He reports that he uses illicit drugs (Marijuana) about 7 times per week.  Allergies: No Known Allergies  Medications:  I have reviewed the patient's current medications. Prior to Admission:  Prescriptions prior to admission  Medication Sig Dispense Refill Last Dose  . amLODipine (NORVASC) 5 MG tablet Take 5 mg by mouth daily.  1 03/19/2015 at Unknown time  . hydrochlorothiazide (HYDRODIURIL) 25 MG tablet Take 25 mg by mouth daily.  1 03/19/2015 at Unknown time  . lisinopril (PRINIVIL,ZESTRIL) 20 MG tablet Take 20 mg by  mouth daily.  0 03/19/2015 at Unknown time  . nicotine (NICODERM CQ - DOSED IN MG/24 HOURS) 21 mg/24hr patch Place 1 patch (21 mg total) onto the skin daily. 28 patch 0 2 weeks  . warfarin (COUMADIN) 10 MG tablet Take 1 tablet (10 mg total) by mouth daily at 6 PM. (Patient taking differently: Take 5-10 mg by mouth daily at 6 PM. Take 5 mg only on Wednesday Take 10 mg all other days) 30 tablet 1 03/18/2015 at Unknown time   Scheduled: .  stroke: mapping our early stages of recovery book   Does not apply Once  . [START ON 03/20/2015] amLODipine  5 mg Oral Daily  . [START ON 03/20/2015] hydrochlorothiazide  25 mg Oral Daily  . [START ON 03/20/2015] lisinopril  20 mg Oral Daily  . [START ON 03/20/2015] nicotine  21 mg Transdermal Daily  . warfarin  15 mg Oral q1800    ROS: History obtained from the patient  General ROS: negative for - chills, fatigue, fever, night sweats, weight gain or weight loss Psychological ROS: negative for - behavioral disorder, hallucinations, memory difficulties, mood swings or suicidal ideation Ophthalmic ROS: negative for - blurry vision, double vision, eye pain or loss of vision ENT ROS: negative for - epistaxis, nasal discharge, oral lesions, sore throat, tinnitus or vertigo Allergy and Immunology ROS: negative for -  hives or itchy/watery eyes Hematological and Lymphatic ROS: negative for - bleeding problems, bruising or swollen lymph nodes Endocrine ROS: negative for - galactorrhea, hair pattern changes, polydipsia/polyuria or temperature intolerance Respiratory ROS: negative for - cough, hemoptysis, shortness of breath or wheezing Cardiovascular ROS: negative for - chest pain, dyspnea on exertion, edema or irregular heartbeat Gastrointestinal ROS: negative for - abdominal pain, diarrhea, hematemesis, nausea/vomiting or stool incontinence Genito-Urinary ROS: negative for - dysuria, hematuria, incontinence or urinary frequency/urgency Musculoskeletal ROS: negative for -  joint swelling or muscular weakness Neurological ROS: as noted in HPI Dermatological ROS: negative for rash and skin lesion changes  Physical Examination: Blood pressure 126/93, pulse 81, temperature 98.2 F (36.8 C), temperature source Oral, resp. rate 16, SpO2 99 %.  HEENT-  Normocephalic, no lesions, without obvious abnormality.  Normal external eye and conjunctiva.  Normal TM's bilaterally.  Normal auditory canals and external ears. Normal external nose, mucus membranes and septum.  Normal pharynx. Cardiovascular- S1, S2 normal, pulses palpable throughout   Lungs- chest clear, no wheezing, rales, normal symmetric air entry Abdomen- soft, non-tender; bowel sounds normal; no masses,  no organomegaly Extremities- no edema Lymph-no adenopathy palpable Musculoskeletal-no joint tenderness, deformity or swelling Skin-warm and dry, no hyperpigmentation, vitiligo, or suspicious lesions  Neurological Examination Mental Status: Alert, oriented, thought content appropriate.  Speech fluent without evidence of aphasia.  Mild dysarthria.  Able to follow 3 step commands without difficulty. Cranial Nerves: II: Discs flat bilaterally; Visual fields grossly normal, pupils equal, round, reactive to light and accommodation III,IV, VI: ptosis not present, extra-ocular motions intact bilaterally V,VII: smile symmetric, facial light touch sensation normal bilaterally VIII: hearing normal bilaterally IX,X: gag reflex present XI: bilateral shoulder shrug XII: midline tongue extension Motor: Right : Upper extremity   5/5    Left:     Upper extremity   5/5  Lower extremity   5/5     Lower extremity   5/5 Tone and bulk:normal tone throughout; no atrophy noted Sensory: Pinprick and light touch intact throughout, bilaterally Deep Tendon Reflexes: 2+ and symmetric throughout Plantars: Right: downgoing   Left: downgoing Cerebellar: Normal finger-to-nose testing bilaterally.  Heel-to-shin testing with  dysmetria bilaterally Gait: not tested due to safety concerns   Laboratory Studies:  Basic Metabolic Panel:  Recent Labs Lab 03/19/15 1446 03/19/15 1451  NA 139 136  K 4.1 4.1  CL 102 103  CO2  --  25  GLUCOSE 100* 102*  BUN 22* 18  CREATININE 1.20 1.22  CALCIUM  --  9.6    Liver Function Tests:  Recent Labs Lab 03/19/15 1451  AST 25  ALT 19  ALKPHOS 74  BILITOT 0.5  PROT 7.4  ALBUMIN 4.3   No results for input(s): LIPASE, AMYLASE in the last 168 hours. No results for input(s): AMMONIA in the last 168 hours.  CBC:  Recent Labs Lab 03/19/15 1446 03/19/15 1451  WBC  --  6.7  NEUTROABS  --  4.6  HGB 13.6 11.9*  HCT 40.0 35.1*  MCV  --  93.4  PLT  --  300    Cardiac Enzymes: No results for input(s): CKTOTAL, CKMB, CKMBINDEX, TROPONINI in the last 168 hours.  BNP: Invalid input(s): POCBNP  CBG: No results for input(s): GLUCAP in the last 168 hours.  Microbiology: Results for orders placed or performed during the hospital encounter of 07/10/13  MRSA PCR Screening     Status: None   Collection Time: 07/13/13  3:00 AM  Result Value Ref  Range Status   MRSA by PCR NEGATIVE NEGATIVE Final    Comment:        The GeneXpert MRSA Assay (FDA approved for NASAL specimens only), is one component of a comprehensive MRSA colonization surveillance program. It is not intended to diagnose MRSA infection nor to guide or monitor treatment for MRSA infections.    Coagulation Studies:  Recent Labs  03/19/15 1451  LABPROT 16.2*  INR 1.29    Urinalysis: No results for input(s): COLORURINE, LABSPEC, PHURINE, GLUCOSEU, HGBUR, BILIRUBINUR, KETONESUR, PROTEINUR, UROBILINOGEN, NITRITE, LEUKOCYTESUR in the last 168 hours.  Invalid input(s): APPERANCEUR  Lipid Panel:    Component Value Date/Time   CHOL 139 02/16/2011 0550   TRIG 57 02/16/2011 0550   HDL 46 02/16/2011 0550   CHOLHDL 3.0 02/16/2011 0550   VLDL 11 02/16/2011 0550   LDLCALC 82 02/16/2011 0550     HgbA1C:  Lab Results  Component Value Date   HGBA1C 5.2 07/12/2013    Urine Drug Screen:     Component Value Date/Time   LABOPIA NONE DETECTED 07/10/2013 1401   COCAINSCRNUR NONE DETECTED 07/10/2013 1401   LABBENZ NONE DETECTED 07/10/2013 1401   AMPHETMU NONE DETECTED 07/10/2013 1401   THCU POSITIVE* 07/10/2013 1401   LABBARB NONE DETECTED 07/10/2013 1401    Alcohol Level: No results for input(s): ETH in the last 168 hours.  Other results: EKG:  normal sinus rhythm at 99 bpm.  Imaging: Ct Head Wo Contrast  03/19/2015   CLINICAL DATA:  59 year old male who awoke with right hand weakness and slurred speech. Personal history of prior stroke with left side deficits. Initial encounter.  EXAM: CT HEAD WITHOUT CONTRAST  TECHNIQUE: Contiguous axial images were obtained from the base of the skull through the vertex without intravenous contrast.  COMPARISON:  07/16/2013 and earlier.  FINDINGS: Visualized paranasal sinuses and mastoids are clear. No acute osseous abnormality identified. Chronic left lateral scalp lipoma. No acute orbit or scalp soft tissue findings.  Calcified atherosclerosis at the skull base. Sequelae of chronic hemorrhagic infarction in the left cerebellar hemisphere, more chronic right cerebellar hemisphere infarcts, and bilateral anterior operculum and posterior right MCA cortically based infarcts. No new cortically based infarct identified. No midline shift, mass effect, or evidence of intracranial mass lesion. No ventriculomegaly. No acute intracranial hemorrhage identified. Dural calcifications. No suspicious intracranial vascular hyperdensity.  IMPRESSION: Sequelae of bilateral cerebral and cerebellar hemisphere infarcts. No acute cortically based infarct or new intracranial abnormality identified.   Electronically Signed   By: Genevie Ann M.D.   On: 03/19/2015 16:18   Mr Brain Wo Contrast (neuro Protocol)  03/19/2015   CLINICAL DATA:  Patient awoke with RIGHT arm and hand  weakness and slurred speech. Normal motor exam but slurred speech remains. History of aortic valve replacement and mitral valve replacement.  EXAM: MRI HEAD WITHOUT CONTRAST  TECHNIQUE: Multiplanar, multiecho pulse sequences of the brain and surrounding structures were obtained without intravenous contrast.  COMPARISON:  Multiple prior CT head scans.  FINDINGS: Small area of restricted diffusion affects the LEFT middle cerebellar peduncle as seen on image 13 series 3 and image 21 series 12 consistent with a small brainstem/posterior circulation acute infarct. There is a much larger area of chronic cerebellar infarction observed adjacent to this abnormality.  No areas concerning for acute infarction in the cerebral hemispheres, specifically on the LEFT.  No hemorrhage, mass lesion, or extra-axial fluid. Large remote BILATERAL cerebral infarcts affect the frontal and parietal lobes, worse on the RIGHT. Large  BILATERAL cerebellar infarcts are also observed.  There is corpus callosum and brainstem atrophy. Hydrocephalus ex vacuo. There is extensive cerebral and cerebellar gliosis with modest small vessel disease.  Flow voids are maintained in the major intracranial vessels. Chronic hemorrhage is associated with the remote LEFT cerebellar infarct. No midline abnormality. Extracranial soft tissues unremarkable. No sinus or mastoid disease. Negative orbits.  IMPRESSION: Small area of acute infarction affects the LEFT middle cerebellar peduncle. It is unclear how this correlates with the patient's reported speech difficulty and RIGHT arm weakness.  There is widespread chronic infarction throughout the cerebral hemispheres and cerebellum. Generalized atrophy with small vessel disease.   Electronically Signed   By: Staci Righter M.D.   On: 03/19/2015 20:18    Assessment: 59 y.o. male s/p valve replacement on Coumadin who presents after an episode of slurred speech and right upper extremity weakness.  Symptoms now resolved.   MRI of the brain independently reviewed and shows an acute left middle cerebellar peduncle infarct.  Patient on Coumadin but INR subtherapeutic.    Stroke Risk Factors - smoking  Plan: 1. HgbA1c, fasting lipid panel 2. PT consult, OT consult, Speech consult 3. Echocardiogram 4. Prophylactic therapy-Continue Coumadin.  Stroke size very small.  Patient may be bridged with Heparin but would not bolus.   5. NPO until RN stroke swallow screen 6. Telemetry monitoring 7. Frequent neuro checks  Case discussed with Dr. Alphonsa Overall, MD [facility] [phone] 03/19/2015, 10:05 PM

## 2015-03-19 NOTE — ED Notes (Signed)
Pt reports hx of stroke, pt reports waking up this am with difficulty griping his cane with right hand and had slurred speech. Reports symptoms improved but still has slurred speech. Grips are equal at triage.

## 2015-03-19 NOTE — ED Provider Notes (Signed)
CSN: [id number]     Arrival date & time 03/19/15  1412 History   First MD Initiated Contact with Patient 03/19/15 1515     Chief Complaint  Patient presents with  . Stroke Symptoms     (Consider location/radiation/quality/duration/timing/severity/associated sxs/prior Treatment) HPI Comments: 59yo M w/ PMH including valve replacement on coumadin, CVA, HTN who presents with right hand weakness and slurred speech. Patient reports at 7:15 this morning, he was at an ATM and began having difficulty using his right hand. He also noticed speech difficulty. His symptoms lasted approximately 20 minutes, after which his hand weakness resolved. He states that his speech is improved but feels that it is not at baseline. He denies any current weakness or numbness, headache, visual changes, or pain. He denies any fevers, vomiting, diarrhea, chest pain, or shortness of breath. No recent illness. He has been taking all medications as prescribed.  The history is provided by the patient.    Past Medical History  Diagnosis Date  . History of CVA (cerebrovascular accident)     02/2011  . Hx of aortic valve replacement, mechanical   . H/O mitral valve replacement with mechanical valve   . HX: long term anticoagulant use    Past Surgical History  Procedure Laterality Date  . Cardiac valve replacement     Family History  Problem Relation Age of Onset  . Diabetes Mother     Died before her 9 birthday   History  Substance Use Topics  . Smoking status: Current Every Day Smoker -- 0.05 packs/day    Types: Cigarettes  . Smokeless tobacco: Not on file  . Alcohol Use: 8.4 oz/week    14 Cans of beer per week    Review of Systems  10 Systems reviewed and are negative for acute change except as noted in the HPI.   Allergies  Review of patient's allergies indicates no known allergies.  Home Medications   Prior to Admission medications   Medication Sig Start Date End Date Taking? Authorizing Provider   amLODipine (NORVASC) 5 MG tablet Take 5 mg by mouth daily. 03/05/15  Yes Historical Provider, MD  hydrochlorothiazide (HYDRODIURIL) 25 MG tablet Take 25 mg by mouth daily. 03/05/15  Yes Historical Provider, MD  lisinopril (PRINIVIL,ZESTRIL) 20 MG tablet Take 20 mg by mouth daily. 03/05/15  Yes Historical Provider, MD  nicotine (NICODERM CQ - DOSED IN MG/24 HOURS) 21 mg/24hr patch Place 1 patch (21 mg total) onto the skin daily. 09/10/14  Yes Charolette Forward, MD  warfarin (COUMADIN) 10 MG tablet Take 1 tablet (10 mg total) by mouth daily at 6 PM. Patient taking differently: Take 5-10 mg by mouth daily at 6 PM. Take 5 mg only on Wednesday Take 10 mg all other days 07/29/13  Yes Daniel J Angiulli, PA-C   BP 132/94 mmHg  Pulse 86  Temp(Src) 98.7 F (37.1 C) (Oral)  Resp 18  SpO2 97% Physical Exam  Constitutional: He is oriented to person, place, and time. He appears well-developed and well-nourished. No distress.  Awake, alert  HENT:  Head: Normocephalic and atraumatic.  Eyes: Conjunctivae and EOM are normal. Pupils are equal, round, and reactive to light.  Neck: Neck supple.  Cardiovascular: Normal rate and regular rhythm.   S1 and S2 click  Pulmonary/Chest: Effort normal and breath sounds normal. No respiratory distress.  Abdominal: Soft. Bowel sounds are normal. He exhibits no distension.  Musculoskeletal: He exhibits no edema.  Neurological: He is alert and oriented to person, place,  and time. He has normal reflexes. No cranial nerve deficit. He exhibits normal muscle tone.  No appreciable slurred speech or word finding difficulty, normal finger-to-nose testing R, slightly abnormal on L;  negative pronator drift  Skin: Skin is warm and dry.  Psychiatric: He has a normal mood and affect. Judgment and thought content normal.  Nursing note and vitals reviewed.   ED Course  Procedures (including critical care time) Labs Review Labs Reviewed  PROTIME-INR - Abnormal; Notable for the  following:    Prothrombin Time 16.2 (*)    All other components within normal limits  CBC - Abnormal; Notable for the following:    RBC 3.76 (*)    Hemoglobin 11.9 (*)    HCT 35.1 (*)    All other components within normal limits  COMPREHENSIVE METABOLIC PANEL - Abnormal; Notable for the following:    Glucose, Bld 102 (*)    All other components within normal limits  I-STAT CHEM 8, ED - Abnormal; Notable for the following:    BUN 22 (*)    Glucose, Bld 100 (*)    All other components within normal limits  APTT  DIFFERENTIAL  I-STAT TROPOININ, ED    Imaging Review Ct Head Wo Contrast  03/19/2015   CLINICAL DATA:  59 year old male who awoke with right hand weakness and slurred speech. Personal history of prior stroke with left side deficits. Initial encounter.  EXAM: CT HEAD WITHOUT CONTRAST  TECHNIQUE: Contiguous axial images were obtained from the base of the skull through the vertex without intravenous contrast.  COMPARISON:  07/16/2013 and earlier.  FINDINGS: Visualized paranasal sinuses and mastoids are clear. No acute osseous abnormality identified. Chronic left lateral scalp lipoma. No acute orbit or scalp soft tissue findings.  Calcified atherosclerosis at the skull base. Sequelae of chronic hemorrhagic infarction in the left cerebellar hemisphere, more chronic right cerebellar hemisphere infarcts, and bilateral anterior operculum and posterior right MCA cortically based infarcts. No new cortically based infarct identified. No midline shift, mass effect, or evidence of intracranial mass lesion. No ventriculomegaly. No acute intracranial hemorrhage identified. Dural calcifications. No suspicious intracranial vascular hyperdensity.  IMPRESSION: Sequelae of bilateral cerebral and cerebellar hemisphere infarcts. No acute cortically based infarct or new intracranial abnormality identified.   Electronically Signed   By: Genevie Ann M.D.   On: 03/19/2015 16:18   Mr Brain Wo Contrast (neuro  Protocol)  03/19/2015   CLINICAL DATA:  Patient awoke with RIGHT arm and hand weakness and slurred speech. Normal motor exam but slurred speech remains. History of aortic valve replacement and mitral valve replacement.  EXAM: MRI HEAD WITHOUT CONTRAST  TECHNIQUE: Multiplanar, multiecho pulse sequences of the brain and surrounding structures were obtained without intravenous contrast.  COMPARISON:  Multiple prior CT head scans.  FINDINGS: Small area of restricted diffusion affects the LEFT middle cerebellar peduncle as seen on image 13 series 3 and image 21 series 12 consistent with a small brainstem/posterior circulation acute infarct. There is a much larger area of chronic cerebellar infarction observed adjacent to this abnormality.  No areas concerning for acute infarction in the cerebral hemispheres, specifically on the LEFT.  No hemorrhage, mass lesion, or extra-axial fluid. Large remote BILATERAL cerebral infarcts affect the frontal and parietal lobes, worse on the RIGHT. Large BILATERAL cerebellar infarcts are also observed.  There is corpus callosum and brainstem atrophy. Hydrocephalus ex vacuo. There is extensive cerebral and cerebellar gliosis with modest small vessel disease.  Flow voids are maintained in the major intracranial  vessels. Chronic hemorrhage is associated with the remote LEFT cerebellar infarct. No midline abnormality. Extracranial soft tissues unremarkable. No sinus or mastoid disease. Negative orbits.  IMPRESSION: Small area of acute infarction affects the LEFT middle cerebellar peduncle. It is unclear how this correlates with the patient's reported speech difficulty and RIGHT arm weakness.  There is widespread chronic infarction throughout the cerebral hemispheres and cerebellum. Generalized atrophy with small vessel disease.   Electronically Signed   By: Staci Righter M.D.   On: 03/19/2015 20:18     EKG Interpretation None      MDM   Final diagnoses:  Slurred speech  acute L  middle cerebellar stroke    59 year old male with history of stroke who presents with an episode of right hand weakness and slurred speech that occurred earlier today. Hand weakness resolved at presentation. Patient well-appearing with reassuring vital signs. No asymmetry noted on exam, mild abnormality of finger to nose testing on left. Obtained above lab work as well as a CT of the head. Labs notable for subtherapeutic INR. CT with no acute findings. Because of the patient's history of stroke and concerning symptoms, obtained an MRI which showed a small area of acute infarction in the left middle cerebellar peduncle. I contacted the neurologist, Dr. Doy Mince, who will evaluate the patient as a consult. Patient admitted to general medicine for further evaluation.    Sharlett Iles, MD 03/20/15 [phone]

## 2015-03-19 NOTE — ED Notes (Signed)
High fall risk band and socks placed on patient

## 2015-03-19 NOTE — H&P (Signed)
Referring Physician:  [name] is an 59 y.o. male.                       Chief Complaint: Right hand weakness and speech problem  HPI: 59 yrs old male with PMH including Aortic and Mitral valve replacement on coumadin, CVA, HTN presents with right hand weakness and slurred speech. Patient reports at 7:15 this morning, he was at an ATM and began having difficulty using his right hand. He also noticed speech difficulty. His symptoms lasted approximately 20 minutes, after which his hand weakness resolved. He states that his speech is improved but feels that it is not at baseline. He denies any current weakness or numbness, headache, visual changes, or pain. He denies any fevers, vomiting, diarrhea, chest pain, or shortness of breath. No recent illness. He has been taking all medications as prescribed. Blood work showed sub-therapeutic INR. MRI/MRA brain showed small left middle cerebellar peduncle stroke + widespread chronic bilateral cerebral and cerebellar infarctions.  Past Medical History  Diagnosis Date  . History of CVA (cerebrovascular accident)     02/2011  . Hx of aortic valve replacement, mechanical   . H/O mitral valve replacement with mechanical valve   . HX: long term anticoagulant use       Past Surgical History  Procedure Laterality Date  . Cardiac valve replacement      Family History  Problem Relation Age of Onset  . Diabetes Mother     Died before her 2 birthday   Social History:  reports that he has been smoking Cigarettes.  He has been smoking about 0.05 packs per day. He does not have any smokeless tobacco history on file. He reports that he drinks about 8.4 oz of alcohol per week. He reports that he uses illicit drugs (Marijuana) about 7 times per week.  Allergies: No Known Allergies   (Not in a hospital admission)  Results for orders placed or performed during the hospital encounter of 03/19/15 (from the past 48 hour(s))  I-stat troponin, ED (not at [facility], [facility])      Status: None   Collection Time: 03/19/15  2:43 PM  Result Value Ref Range   Troponin i, poc 0.00 0.00 - 0.08 ng/mL   Comment 3            Comment: Due to the release kinetics of cTnI, a negative result within the first hours of the onset of symptoms does not rule out myocardial infarction with certainty. If myocardial infarction is still suspected, repeat the test at appropriate intervals.   I-Stat Chem 8, ED  (not at [facility], [facility])     Status: Abnormal   Collection Time: 03/19/15  2:46 PM  Result Value Ref Range   Sodium 139 135 - 145 mmol/L   Potassium 4.1 3.5 - 5.1 mmol/L   Chloride 102 101 - 111 mmol/L   BUN 22 (H) 6 - 20 mg/dL   Creatinine, Ser 1.22 0.61 - 1.24 mg/dL   Glucose, Bld 400 (H) 65 - 99 mg/dL   Calcium, Ion 1.80 9.70 - 1.30 mmol/L   TCO2 22 0 - 100 mmol/L   Hemoglobin 13.6 13.0 - 17.0 g/dL   HCT 44.9 25.2 - 41.5 %  Protime-INR     Status: Abnormal   Collection Time: 03/19/15  2:51 PM  Result Value Ref Range   Prothrombin Time 16.2 (H) 11.6 - 15.2 seconds   INR 1.29 0.00 - 1.49  APTT  Status: None   Collection Time: 03/19/15  2:51 PM  Result Value Ref Range   aPTT 28 24 - 37 seconds  CBC     Status: Abnormal   Collection Time: 03/19/15  2:51 PM  Result Value Ref Range   WBC 6.7 4.0 - 10.5 K/uL   RBC 3.76 (L) 4.22 - 5.81 MIL/uL   Hemoglobin 11.9 (L) 13.0 - 17.0 g/dL   HCT 35.1 (L) 39.0 - 52.0 %   MCV 93.4 78.0 - 100.0 fL   MCH 31.6 26.0 - 34.0 pg   MCHC 33.9 30.0 - 36.0 g/dL   RDW 12.7 11.5 - 15.5 %   Platelets 300 150 - 400 K/uL  Differential     Status: None   Collection Time: 03/19/15  2:51 PM  Result Value Ref Range   Neutrophils Relative % 70 43 - 77 %   Neutro Abs 4.6 1.7 - 7.7 K/uL   Lymphocytes Relative 22 12 - 46 %   Lymphs Abs 1.5 0.7 - 4.0 K/uL   Monocytes Relative 7 3 - 12 %   Monocytes Absolute 0.5 0.1 - 1.0 K/uL   Eosinophils Relative 1 0 - 5 %   Eosinophils Absolute 0.1 0.0 - 0.7 K/uL   Basophils Relative 0 0 - 1 %   Basophils  Absolute 0.0 0.0 - 0.1 K/uL  Comprehensive metabolic panel     Status: Abnormal   Collection Time: 03/19/15  2:51 PM  Result Value Ref Range   Sodium 136 135 - 145 mmol/L   Potassium 4.1 3.5 - 5.1 mmol/L   Chloride 103 101 - 111 mmol/L   CO2 25 22 - 32 mmol/L   Glucose, Bld 102 (H) 65 - 99 mg/dL   BUN 18 6 - 20 mg/dL   Creatinine, Ser 1.22 0.61 - 1.24 mg/dL   Calcium 9.6 8.9 - 10.3 mg/dL   Total Protein 7.4 6.5 - 8.1 g/dL   Albumin 4.3 3.5 - 5.0 g/dL   AST 25 15 - 41 U/L   ALT 19 17 - 63 U/L   Alkaline Phosphatase 74 38 - 126 U/L   Total Bilirubin 0.5 0.3 - 1.2 mg/dL   GFR calc non Af Amer >60 >60 mL/min   GFR calc Af Amer >60 >60 mL/min    Comment: (NOTE) The eGFR has been calculated using the CKD EPI equation. This calculation has not been validated in all clinical situations. eGFR's persistently <60 mL/min signify possible Chronic Kidney Disease.    Anion gap 8 5 - 15   Ct Head Wo Contrast  03/19/2015   CLINICAL DATA:  59 year old male who awoke with right hand weakness and slurred speech. Personal history of prior stroke with left side deficits. Initial encounter.  EXAM: CT HEAD WITHOUT CONTRAST  TECHNIQUE: Contiguous axial images were obtained from the base of the skull through the vertex without intravenous contrast.  COMPARISON:  07/16/2013 and earlier.  FINDINGS: Visualized paranasal sinuses and mastoids are clear. No acute osseous abnormality identified. Chronic left lateral scalp lipoma. No acute orbit or scalp soft tissue findings.  Calcified atherosclerosis at the skull base. Sequelae of chronic hemorrhagic infarction in the left cerebellar hemisphere, more chronic right cerebellar hemisphere infarcts, and bilateral anterior operculum and posterior right MCA cortically based infarcts. No new cortically based infarct identified. No midline shift, mass effect, or evidence of intracranial mass lesion. No ventriculomegaly. No acute intracranial hemorrhage identified. Dural  calcifications. No suspicious intracranial vascular hyperdensity.  IMPRESSION: Sequelae of bilateral cerebral  and cerebellar hemisphere infarcts. No acute cortically based infarct or new intracranial abnormality identified.   Electronically Signed   By: Genevie Ann M.D.   On: 03/19/2015 16:18   Mr Brain Wo Contrast (neuro Protocol)  03/19/2015   CLINICAL DATA:  Patient awoke with RIGHT arm and hand weakness and slurred speech. Normal motor exam but slurred speech remains. History of aortic valve replacement and mitral valve replacement.  EXAM: MRI HEAD WITHOUT CONTRAST  TECHNIQUE: Multiplanar, multiecho pulse sequences of the brain and surrounding structures were obtained without intravenous contrast.  COMPARISON:  Multiple prior CT head scans.  FINDINGS: Small area of restricted diffusion affects the LEFT middle cerebellar peduncle as seen on image 13 series 3 and image 21 series 12 consistent with a small brainstem/posterior circulation acute infarct. There is a much larger area of chronic cerebellar infarction observed adjacent to this abnormality.  No areas concerning for acute infarction in the cerebral hemispheres, specifically on the LEFT.  No hemorrhage, mass lesion, or extra-axial fluid. Large remote BILATERAL cerebral infarcts affect the frontal and parietal lobes, worse on the RIGHT. Large BILATERAL cerebellar infarcts are also observed.  There is corpus callosum and brainstem atrophy. Hydrocephalus ex vacuo. There is extensive cerebral and cerebellar gliosis with modest small vessel disease.  Flow voids are maintained in the major intracranial vessels. Chronic hemorrhage is associated with the remote LEFT cerebellar infarct. No midline abnormality. Extracranial soft tissues unremarkable. No sinus or mastoid disease. Negative orbits.  IMPRESSION: Small area of acute infarction affects the LEFT middle cerebellar peduncle. It is unclear how this correlates with the patient's reported speech difficulty and  RIGHT arm weakness.  There is widespread chronic infarction throughout the cerebral hemispheres and cerebellum. Generalized atrophy with small vessel disease.   Electronically Signed   By: Staci Righter M.D.   On: 03/19/2015 20:18    Review Of Systems The patient denies anorexia, fever, weight loss,, vision loss, decreased hearing, hoarseness, chest pain, syncope, dyspnea on exertion, peripheral edema, balance deficits, hemoptysis, abdominal pain, melena, hematochezia, severe indigestion/heartburn, hematuria, incontinence, genital sores, muscle weakness, transient blindness, difficulty walking, depression, unusual weight change, abnormal bleeding   Blood pressure 126/93, pulse 81, temperature 98.2 F (36.8 C), temperature source Oral, resp. rate 16, SpO2 99 %. Constitutional: Vital signs reviewed. Patient is a well-developed and well-nourished in no acute distress and cooperative with exam.    Head: Normocephalic and atraumatic Nose: No erythema or drainage noted.Turbinates normal Mouth: no erythema or exudates, MMM Eyes: Brown eyes, PERRL, EOMI, conjunctivae normal, No scleral icterus.  Neck: Supple, Trachea midline normal ROM, No JVD, mass, thyromegaly, or carotid bruit present.  Cardiovascular: RRR, S1 normal, S2 normal, II/VI systolic murmur, pulses symmetric and intact bilaterally Pulmonary/Chest: normal respiratory effort, CTAB, no wheezes, rales, or rhonchi Abdominal: Soft. Non-tender, non-distended, bowel sounds are normal, no masses, organomegaly, or guarding present.  Extremities: No cyanosis and no edema  Neurological: A&O x3, Strength is normal and mildly asymmetric with right hand weakness. Cranial nerve II-XII are grossly intact, sensory intact to light touch bilaterally. Gait not checked. Skin: Warm, dry and intact. No rash, cyanosis, or clubbing.  Psychiatric: Normal mood and affect.   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 Home medications.  Birdie Riddle, MD  03/19/2015, 10:06 PM

## 2015-03-19 NOTE — ED Notes (Signed)
Pt to MRI

## 2015-03-19 NOTE — Progress Notes (Signed)
ANTICOAGULATION CONSULT NOTE - Initial Consult  Pharmacy Consult:  Heparin Indication:  Prosthetic MV / AV valves  No Known Allergies  Patient Measurements: Height: 6' (182.9 cm) Weight: 180 lb (81.647 kg) IBW/kg (Calculated) : 77.6 Heparin Dosing Weight: 82 kg  Vital Signs: Temp: 98 F (36.7 C) (08/05 2212) Temp Source: Oral (08/05 2212) BP: 153/93 mmHg (08/05 2212) Pulse Rate: 85 (08/05 2212)  Labs:  Recent Labs  03/19/15 1446 03/19/15 1451  HGB 13.6 11.9*  HCT 40.0 35.1*  PLT  --  300  APTT  --  28  LABPROT  --  16.2*  INR  --  1.29  CREATININE 1.20 1.22    Estimated Creatinine Clearance: 70.7 mL/min (by C-G formula based on Cr of 1.22).   Medical History: Past Medical History  Diagnosis Date  . History of CVA (cerebrovascular accident)     02/2011  . Hx of aortic valve replacement, mechanical   . H/O mitral valve replacement with mechanical valve   . HX: long term anticoagulant use      Assessment: 65 YOM with history of CVA and mechanical AVR/MVR presented with slurred speech and RUE weakness.  Patient was on Coumadin PTA and his INR is sub-therapeutic on admit.  Head CT negative for bleed.  Pharmacy consulted to initiate IV heparin bridge.  Baseline labs reviewed.   Goal of Therapy:  Heparin level 0.3-0.7 units/ml Monitor platelets by anticoagulation protocol: Yes    Plan:  - Heparin gtt at 1150 units/hr, no bolus - Coumadin per MD - Check 6 hr HL - Daily HL / CBC / PT / INR    Jeff [name], PharmD, BCPS Pager:  [phone] 03/19/2015, 10:30 PM

## 2015-03-19 NOTE — Progress Notes (Signed)
Patient arrived to 4N30 AAOx4. Vitals taken, tele placed, questions answered. Will continue to monitor. Lizzet Hendley, Rande Brunt, RN

## 2015-03-20 ENCOUNTER — Inpatient Hospital Stay (HOSPITAL_COMMUNITY): Payer: Medicaid Other

## 2015-03-20 DIAGNOSIS — R4781 Slurred speech: Secondary | ICD-10-CM | POA: Insufficient documentation

## 2015-03-20 LAB — CBC
HCT: 35.5 % — ABNORMAL LOW (ref 39.0–52.0)
Hemoglobin: 11.9 g/dL — ABNORMAL LOW (ref 13.0–17.0)
MCH: 31.4 pg (ref 26.0–34.0)
MCHC: 33.5 g/dL (ref 30.0–36.0)
MCV: 93.7 fL (ref 78.0–100.0)
Platelets: 290 10*3/uL (ref 150–400)
RBC: 3.79 MIL/uL — AB (ref 4.22–5.81)
RDW: 12.6 % (ref 11.5–15.5)
WBC: 4.9 10*3/uL (ref 4.0–10.5)

## 2015-03-20 LAB — LIPID PANEL
CHOLESTEROL: 180 mg/dL (ref 0–200)
HDL: 49 mg/dL (ref >40)
LDL Cholesterol: 115 mg/dL — ABNORMAL HIGH (ref 0–99)
TRIGLYCERIDES: 79 mg/dL (ref <150)
Total CHOL/HDL Ratio: 3.7 RATIO
VLDL: 16 mg/dL (ref 0–40)

## 2015-03-20 LAB — HEPARIN LEVEL (UNFRACTIONATED)
HEPARIN UNFRACTIONATED: 0.62 [IU]/mL (ref 0.30–0.70)
Heparin Unfractionated: 0.15 IU/mL — ABNORMAL LOW (ref 0.30–0.70)

## 2015-03-20 LAB — PROTIME-INR
INR: 1.3 (ref 0.00–1.49)
PROTHROMBIN TIME: 16.3 s — AB (ref 11.6–15.2)

## 2015-03-20 MED ORDER — ATORVASTATIN CALCIUM 10 MG PO TABS
20.0000 mg | ORAL_TABLET | Freq: Every day | ORAL | Status: DC
  Administered 2015-03-20 – 2015-03-23 (×4): 20 mg via ORAL
  Filled 2015-03-20 (×4): qty 2

## 2015-03-20 NOTE — Progress Notes (Signed)
Ref: Patricia Nettle, MD   Subjective:  Feeling better. No chest pain. Chronic left sided weakness. Uses cane for ambulation. Afebrile.  Objective:  Vital Signs in the last 24 hours: Temp:  [97.9 F (36.6 C)-98.7 F (37.1 C)] 98.1 F (36.7 C) (08/06 0600) Pulse Rate:  [78-99] 83 (08/06 0600) Cardiac Rhythm:  [-] Normal sinus rhythm (08/05 1530) Resp:  [10-22] 18 (08/06 0600) BP: (100-153)/(60-100) 114/81 mmHg (08/06 0600) SpO2:  [97 %-100 %] 98 % (08/06 0600) Weight:  [81.647 kg (180 lb)] 81.647 kg (180 lb) (08/05 2212)  Physical Exam: BP Readings from Last 1 Encounters:  03/20/15 114/81    Wt Readings from Last 1 Encounters:  03/19/15 81.647 kg (180 lb)    Weight change:   HEENT: [place]/AT, Eyes-Brown, PERL, EOMI, Conjunctiva-Pink, Sclera-Non-icteric Neck: No JVD, No bruit, Trachea midline. Lungs:  Clear, Bilateral. Midline scar Cardiac:  Regular rhythm, Prominent S1 and S2, no S3. II/VI systolic murmur. Abdomen:  Soft, non-tender. Extremities:  No edema present. No cyanosis. No clubbing. CNS: AxOx3, Cranial nerves grossly intact, moves all 4 extremities. Right handed. Mild left sided weakness. Skin: Warm and dry.   Intake/Output from previous day: 08/05 0701 - 08/06 0700 In: -  Out: 300 [Urine:300]    Lab Results: BMET    Component Value Date/Time   NA 136 03/19/2015 1451   NA 139 03/19/2015 1446   NA 138 09/08/2014 0656   K 4.1 03/19/2015 1451   K 4.1 03/19/2015 1446   K 3.7 09/08/2014 0656   CL 103 03/19/2015 1451   CL 102 03/19/2015 1446   CL 105 09/08/2014 0656   CO2 25 03/19/2015 1451   CO2 25 09/08/2014 0656   CO2 25 09/07/2014 1655   GLUCOSE 102* 03/19/2015 1451   GLUCOSE 100* 03/19/2015 1446   GLUCOSE 95 09/08/2014 0656   BUN 18 03/19/2015 1451   BUN 22* 03/19/2015 1446   BUN 13 09/08/2014 0656   CREATININE 1.22 03/19/2015 1451   CREATININE 1.20 03/19/2015 1446   CREATININE 1.08 09/08/2014 0656   CALCIUM 9.6 03/19/2015 1451   CALCIUM 8.9  09/08/2014 0656   CALCIUM 9.6 09/07/2014 1655   GFRNONAA >60 03/19/2015 1451   GFRNONAA 73* 09/08/2014 0656   GFRNONAA 73* 09/07/2014 1655   GFRAA >60 03/19/2015 1451   GFRAA 84* 09/08/2014 0656   GFRAA 84* 09/07/2014 1655   CBC    Component Value Date/Time   WBC 4.9 03/20/2015 0422   RBC 3.79* 03/20/2015 0422   HGB 11.9* 03/20/2015 0422   HCT 35.5* 03/20/2015 0422   PLT 290 03/20/2015 0422   MCV 93.7 03/20/2015 0422   MCH 31.4 03/20/2015 0422   MCHC 33.5 03/20/2015 0422   RDW 12.6 03/20/2015 0422   LYMPHSABS 1.5 03/19/2015 1451   MONOABS 0.5 03/19/2015 1451   EOSABS 0.1 03/19/2015 1451   BASOSABS 0.0 03/19/2015 1451   HEPATIC Function Panel  Recent Labs  09/07/14 1655 09/08/14 0656 03/19/15 1451  PROT 7.3 6.4 7.4   HEMOGLOBIN A1C No components found for: HGA1C,  MPG CARDIAC ENZYMES Lab Results  Component Value Date   CKTOTAL 93 02/16/2011   CKMB 2.7 02/16/2011   TROPONINI <0.30 07/11/2013   TROPONINI <0.30 07/10/2013   TROPONINI <0.30 07/10/2013   BNP No results for input(s): PROBNP in the last 8760 hours. TSH No results for input(s): TSH in the last 8760 hours. CHOLESTEROL  Recent Labs  03/20/15 0422  CHOL 180    Scheduled Meds: . amLODipine  5  mg Oral Daily  . atorvastatin  20 mg Oral q1800  . hydrochlorothiazide  25 mg Oral Daily  . lisinopril  20 mg Oral Daily  . nicotine  21 mg Transdermal Daily  . warfarin  15 mg Oral q1800  . Warfarin - Physician Dosing Inpatient   Does not apply q1800   Continuous Infusions: . 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 Increase activity.    LOS: 1 day    Dixie Dials  MD  03/20/2015, 9:51 AM

## 2015-03-20 NOTE — Evaluation (Signed)
Speech Language Pathology Evaluation Patient Details Name: Jeff [name] MRN: [id number] DOB: 1956/01/09 Today's Date: 03/20/2015 Time: 5465-6812 SLP Time Calculation (min) (ACUTE ONLY): 15 min  Problem List:  Patient Active Problem List   Diagnosis Date Noted  . Slurred speech   . Acute ischemic stroke 03/19/2015  . Supratherapeutic INR 09/07/2014  . CVA (cerebral infarction) 07/16/2013  . Cerebellar stroke, acute 07/13/2013  . Ataxia 07/12/2013  . Vomiting 07/10/2013  . Nausea with vomiting 07/10/2013  . Subtherapeutic international normalized ratio (INR) 07/10/2013  . AKI (acute kidney injury) 07/10/2013  . Volume depletion 07/10/2013  . Nausea & vomiting 07/10/2013  . Cellulitis of left upper arm and forearm 04/04/2012  . History of CVA (cerebrovascular accident)   . Hx of aortic valve replacement, mechanical   . H/O mitral valve replacement with mechanical valve   . HX: long term anticoagulant use    Past Medical History:  Past Medical History  Diagnosis Date  . History of CVA (cerebrovascular accident)     02/2011  . Hx of aortic valve replacement, mechanical   . H/O mitral valve replacement with mechanical valve   . HX: long term anticoagulant use    Past Surgical History:  Past Surgical History  Procedure Laterality Date  . Cardiac valve replacement     HPI:  Pt is a 59 y.o. male with PMH Aortic and Mitral valve replacement on coumadin, CVA, HTN presents with right hand weakness and slurred speech. Patient reports at 7:15 this morning, he was at an ATM and began having difficulty using his right hand. He also noticed speech difficulty. His symptoms lasted approximately 20 minutes, after which his hand weakness resolved. He states that his speech is improved but feels that it is not at baseline. MRI 8/5 revealed small area of acute infarction in L middle cerebellar peduncle, also widespread chronic infarction throughout cerebral hemispheres and cerebellum. Passed RN  stroke swallow screen. SLP eval ordered as part of stroke workup.   Assessment / Plan / Recommendation Clinical Impression  Pt currently demonstrating cognition, language, and motor speech within functional limits for tasks assessed (memory, orientation, attention, direction following, naming, reading, oral motor). Pt reports that initially speech was slurred but is now 100% back to normal. No weakness or droop noted with oral motor exam- symptoms have resolved. No acute needs identified; will sign off. Please re-consult if needs arise.    SLP Assessment  Patient does not need any further Speech Lanaguage Pathology Services    Follow Up Recommendations  None    Frequency and Duration        Pertinent Vitals/Pain Pain Assessment: No/denies pain   SLP Goals     SLP Evaluation Prior Functioning  Cognitive/Linguistic Baseline: Within functional limits Type of Home: Apartment  Lives With: Alone Available Help at Discharge: Friend(s);Personal care attendant   Cognition  Overall Cognitive Status: Within Functional Limits for tasks assessed Arousal/Alertness: Awake/alert Orientation Level: Oriented X4 Attention: Selective;Alternating Selective Attention: Appears intact Alternating Attention: Appears intact Memory: Appears intact Awareness: Appears intact Problem Solving: Appears intact Safety/Judgment: Appears intact    Comprehension  Auditory Comprehension Overall Auditory Comprehension: Appears within functional limits for tasks assessed Yes/No Questions: Within Functional Limits Commands: Within Functional Limits Conversation: Complex Reading Comprehension Reading Status: Within funtional limits    Expression Expression Primary Mode of Expression: Verbal Verbal Expression Overall Verbal Expression: Appears within functional limits for tasks assessed Initiation: No impairment Level of Generative/Spontaneous Verbalization: Conversation Repetition: No impairment Naming:  No impairment  Pragmatics: No impairment   Oral / Motor Oral Motor/Sensory Function Overall Oral Motor/Sensory Function: Appears within functional limits for tasks assessed Labial ROM: Within Functional Limits Labial Symmetry: Within Functional Limits Labial Strength: Within Functional Limits Labial Sensation: Within Functional Limits Lingual ROM: Within Functional Limits Lingual Symmetry: Within Functional Limits Lingual Strength: Within Functional Limits Lingual Sensation: Within Functional Limits Motor Speech Overall Motor Speech: Appears within functional limits for tasks assessed Articulation: Within functional limitis Intelligibility: Intelligible   GO     Kern Reap, MA, CCC-SLP 03/20/2015, 4:01 PM [phone]

## 2015-03-20 NOTE — Progress Notes (Signed)
STROKE TEAM PROGRESS NOTE   HISTORY Jeff [name] is a 59 y.o. male who reports that he was walking to the ATM this morning and lost control of his cane that he usually holds in his right hand. When he attempted to pick it up he was unable to because he couldn't control his right hand. He went to the store and was unable to open his wallet again because of his right hand. When he attempted to speak his speech was slurred. The patient presented at that time. Symptoms have reoslved. Patient on Coumadin due to aortic and mitral valve replacement but INR is normal today.   Date last known well: Date: 03/19/2015 Time last known well: Time: 07:00 tPA Given: No: Resolution of symptoms   SUBJECTIVE (INTERVAL HISTORY) No family members present. The patient is still having some difficulties with his speech but he feels he is improving.   OBJECTIVE Temp:  [97.9 F (36.6 C)-98.7 F (37.1 C)] 98.1 F (36.7 C) (08/06 0600) Pulse Rate:  [78-99] 83 (08/06 0600) Cardiac Rhythm:  [-] Normal sinus rhythm (08/05 1530) Resp:  [10-22] 18 (08/06 0600) BP: (100-153)/(60-100) 114/81 mmHg (08/06 0600) SpO2:  [97 %-100 %] 98 % (08/06 0600) Weight:  [81.647 kg (180 lb)] 81.647 kg (180 lb) (08/05 2212)  No results for input(s): GLUCAP in the last 168 hours.  Recent Labs Lab 03/19/15 1446 03/19/15 1451  NA 139 136  K 4.1 4.1  CL 102 103  CO2  --  25  GLUCOSE 100* 102*  BUN 22* 18  CREATININE 1.20 1.22  CALCIUM  --  9.6    Recent Labs Lab 03/19/15 1451  AST 25  ALT 19  ALKPHOS 74  BILITOT 0.5  PROT 7.4  ALBUMIN 4.3    Recent Labs Lab 03/19/15 1446 03/19/15 1451 03/19/15 2256 03/20/15 0422  WBC  --  6.7 5.9 4.9  NEUTROABS  --  4.6  --   --   HGB 13.6 11.9* 11.6* 11.9*  HCT 40.0 35.1* 34.4* 35.5*  MCV  --  93.4 93.0 93.7  PLT  --  300 289 290   No results for input(s): CKTOTAL, CKMB, CKMBINDEX, TROPONINI in the last 168 hours.  Recent Labs  03/19/15 1451 03/20/15 0422   LABPROT 16.2* 16.3*  INR 1.29 1.30   No results for input(s): COLORURINE, LABSPEC, PHURINE, GLUCOSEU, HGBUR, BILIRUBINUR, KETONESUR, PROTEINUR, UROBILINOGEN, NITRITE, LEUKOCYTESUR in the last 72 hours.  Invalid input(s): APPERANCEUR     Component Value Date/Time   CHOL 180 03/20/2015 0422   TRIG 79 03/20/2015 0422   HDL 49 03/20/2015 0422   CHOLHDL 3.7 03/20/2015 0422   VLDL 16 03/20/2015 0422   LDLCALC 115* 03/20/2015 0422   Lab Results  Component Value Date   HGBA1C 5.2 07/12/2013      Component Value Date/Time   LABOPIA NONE DETECTED 07/10/2013 1401   COCAINSCRNUR NONE DETECTED 07/10/2013 1401   LABBENZ NONE DETECTED 07/10/2013 1401   AMPHETMU NONE DETECTED 07/10/2013 1401   THCU POSITIVE* 07/10/2013 1401   LABBARB NONE DETECTED 07/10/2013 1401    No results for input(s): ETH in the last 168 hours.   Imaging  Ct Head Wo Contrast 03/19/2015    Sequelae of bilateral cerebral and cerebellar hemisphere infarcts. No acute cortically based infarct or new intracranial abnormality identified.       Mr Brain Wo Contrast (neuro Protocol) 03/19/2015    Small area of acute infarction affects the LEFT middle cerebellar peduncle. It is unclear how  this correlates with the patient's reported speech difficulty and RIGHT arm weakness.  There is widespread chronic infarction throughout the cerebral hemispheres and cerebellum. Generalized atrophy with small vessel disease.       PHYSICAL EXAM Pleasant middle aged male not in distress. . Afebrile. Head is nontraumatic. Neck is supple without bruit.    Cardiac exam no murmur or gallop. Lungs are clear to auscultation. Distal pulses are well felt. Neurological Exam ;  Awake  Alert oriented x 3. Normal speech and language except for mild intermittent dysarthria.eye movements full without nystagmus.fundi were not visualized. Vision acuity and fields appear normal. Hearing is normal. Palatal movements are normal. Face symmetric. Tongue  midline. Normal strength, tone, reflexes and coordination except for mild  Diminished fine finger movements on right and orbits left over right upper extremity. Normal sensation. Gait deferred.   ASSESSMENT/PLAN [name] is a 59 y.o. male with history of previous CVA, hypertension, mitral and aortic valve replacements on Coumadin, presenting with slurred speech and right upper extremity weakness. He did not receive IV t-PA due to resolution of deficits.  Stroke:  Dominant probably embolic secondary to subtherapeutic Coumadin level with history of AVR and MVR.  Resultant resolution of deficits  MRI  Small area of acute infarction affects the LEFT middle cerebellar peduncle.   MRA  not performed  Carotid Doppler pending  2D Echo pending  LDL 115  HgbA1c pending  IV heparin for VTE prophylaxis  Diet Heart Room service appropriate?: Yes; Fluid consistency:: Thin  warfarin prior to admission, now on warfarin and heparin  Patient counseled to be compliant with his antithrombotic medications  Ongoing aggressive stroke risk factor management  Therapy recommendations: Pending  Disposition: Pending  Hypertension  Home meds: Norvasc, hydrochlorothiazide, and lisinopril.  Blood pressure mildly low at times  Patient counseled to be compliant with his blood pressure medications  Hyperlipidemia  Home meds: No lipid lowering medications prior to admission.  LDL 115, goal < 70  Now on Lipitor 20 mg daily  Continue statin at discharge   Other Stroke Risk Factors  Advanced age  Cigarette smoker, advised to stop smoking  ETOH use  Hx stroke/TIA  Prosthetic heart valves on Coumadin   Other Active Problems  Mild anemia  Heparin and Coumadin dosing per pharmacy ( INR - 1.3 Saturday )  UDS positive for [facility]   Other Pertinent History     Hospital day # [facility] PA-C [facility] Pager [phone] 03/20/2015, 10:14 AM He has  presented with slurred speech and right sided weakness.due to left cerebellar infarcts of cardioembolic etiology with mechanical heart valve with suboptimal anticoagulation. He  remains at risk for neurological worsening, recurrent stroke/TIAs and needs ongoing stroke evaluation and aggressive risk factor modification. Agree with bridging iv heparin and continuing warfarin with INR goal of 2.5-3.5. He was counselled to quit marijuana. Antony Contras, MD Medical Director [facility] Pager: [phone] 03/20/2015 3:05 PM     To contact Stroke Continuity provider, please refer to [email or web address] After hours, contact General Neurology

## 2015-03-20 NOTE — Progress Notes (Signed)
ANTICOAGULATION CONSULT NOTE - Follow Up Consult  Pharmacy Consult for Heparin  Indication: AVR/MVR, stroke  No Known Allergies  Patient Measurements: Height: 6' (182.9 cm) Weight: 180 lb (81.647 kg) IBW/kg (Calculated) : 77.6  Vital Signs: Temp: 98.1 F (36.7 C) (08/06 0600) Temp Source: Oral (08/06 0600) BP: 114/81 mmHg (08/06 0600) Pulse Rate: 83 (08/06 0600)  Labs:  Recent Labs  03/19/15 1446 03/19/15 1451 03/19/15 2256 03/20/15 0422  HGB 13.6 11.9* 11.6* 11.9*  HCT 40.0 35.1* 34.4* 35.5*  PLT  --  300 289 290  APTT  --  28  --   --   LABPROT  --  16.2*  --  16.3*  INR  --  1.29  --  1.30  HEPARINUNFRC  --   --   --  0.15*  CREATININE 1.20 1.22  --   --     Estimated Creatinine Clearance: 70.7 mL/min (by C-G formula based on Cr of 1.22).  Assessment: Sub-therapeutic heparin level, small acute embolic stroke on MRI, other labs reviewed.   Goal of Therapy:  Heparin level 0.3-0.5 units/ml Monitor platelets by anticoagulation protocol: Yes   Plan:  -Increase heparin to 1300 units/hr -1300 HL -Daily CBC/HL -Monitor for bleeding  Narda Bonds 03/20/2015,6:38 AM

## 2015-03-20 NOTE — Progress Notes (Signed)
Utilization review completed.  

## 2015-03-20 NOTE — Progress Notes (Signed)
PT Cancellation Note  Patient Details Name: Jeff [name] MRN: [id number] DOB: Nov 24, 1955   Cancelled Treatment:    Reason Eval/Treat Not Completed: Patient not medically ready Patient remains on bed rest at this time. Will follow-up when medically ready for PT evaluation.  Ellouise Newer 03/20/2015, 8:37 AM  Elayne Snare, [place]

## 2015-03-20 NOTE — Evaluation (Signed)
Physical Therapy Evaluation and Discharge Patient Details Name: Jeff [name] MRN: [id number] DOB: 24-Jun-1956 Today's Date: 03/20/2015   History of Present Illness  59 y.o. male admitted for Acute left middle cerebellar peduncle stroke. Hx of old stroke with resulting Lt sided weakness and ataxic gait, prosthetic MV, and prosthetic AV.  Clinical Impression  Patient evaluated by Physical Therapy with no further acute PT needs identified. All education has been completed and the patient has no further questions. Pt reported having some Rt UE weakness and difficulty with speech prior to admission. He states this has since resolved and he feels back to his baseline. Lives alone but has an aide visit daily to assist with home chores/ADLs.Tolerated dynamic gait challenges without loss of balance, able to pick up items from floor, and safely navigates stairs. See below for any follow-up Physial Therapy or equipment needs. PT is signing off. Thank you for this referral.     Follow Up Recommendations No PT follow up (Maintain home health aide for daily assist )    Equipment Recommendations  None recommended by PT    Recommendations for Other Services       Precautions / Restrictions Precautions Precautions: Fall Restrictions Weight Bearing Restrictions: No      Mobility  Bed Mobility               General bed mobility comments: sitting in chair  Transfers Overall transfer level: Modified independent               General transfer comment: Uses cane for stability  Ambulation/Gait Ambulation/Gait assistance: Supervision Ambulation Distance (Feet): 250 Feet Assistive device: Straight cane Gait Pattern/deviations: Step-through pattern;Decreased stride length;Narrow base of support Gait velocity: decreased Gait velocity interpretation: Below normal speed for age/gender General Gait Details: Cues to widen base of support. Tolerated dynamic gait tasks with use of cane for support  including high marches, variable speeds, horizontal/vertical head turns, and backwards stepping. Minimal decline in stability with these tasks and pt did not require physical assist to correct balance at any point during gait training.  Stairs Stairs: Yes Stairs assistance: Supervision Stair Management: No rails;Step to pattern;Forwards;With cane Number of Stairs: 3 General stair comments: VC for sequencing. Using cane for support. Prefers to decend with stronger limb and declines any concerns as he has performed this task without issues at home.  Wheelchair Mobility    Modified Rankin (Stroke Patients Only) Modified Rankin (Stroke Patients Only) Pre-Morbid Rankin Score: Moderate disability Modified Rankin: Moderately severe disability     Balance Overall balance assessment: Modified Independent                                           Pertinent Vitals/Pain Pain Assessment: No/denies pain    Home Living Family/patient expects to be discharged to:: Private residence Living Arrangements: Alone Available Help at Discharge: Personal care attendant (stays 2 hours daily to assist with home chores) Type of Home: Apartment Home Access: Stairs to enter Entrance Stairs-Rails: None Entrance Stairs-Number of Steps: 1 Home Layout: One level Home Equipment: Cane - single point;Shower seat;Grab bars - toilet;Grab bars - tub/shower      Prior Function Level of Independence: Independent with assistive device(s)         Comments: Uses cane for mobility, catches the bus for transportation. Aide assists with house chores.     Hand Dominance   Dominant  Hand: Right    Extremity/Trunk Assessment   Upper Extremity Assessment: Defer to OT evaluation           Lower Extremity Assessment: Generalized weakness         Communication   Communication: No difficulties  Cognition Arousal/Alertness: Awake/alert Behavior During Therapy: WFL for tasks  assessed/performed Overall Cognitive Status: Within Functional Limits for tasks assessed                      General Comments General comments (skin integrity, edema, etc.): Pt states he feels back to his baseline. Has no concerns about returning home. Reviewed signs/symptoms of stroke using FAST acronym as well as modifiable risk factors.    Exercises General Exercises - Lower Extremity Ankle Circles/Pumps: AROM;Both;10 reps;Seated      Assessment/Plan    PT Assessment Patent does not need any further PT services  PT Diagnosis Abnormality of gait   PT Problem List    PT Treatment Interventions     PT Goals (Current goals can be found in the Care Plan section) Acute Rehab PT Goals Patient Stated Goal: Go back home PT Goal Formulation: All assessment and education complete, DC therapy    Frequency     Barriers to discharge        Co-evaluation               End of Session   Activity Tolerance: Patient tolerated treatment well Patient left: in chair;with call bell/phone within reach Nurse Communication: Mobility status         Time: 1726-1749 PT Time Calculation (min) (ACUTE ONLY): 23 min   Charges:   PT Evaluation $Initial PT Evaluation Tier I: 1 Procedure PT Treatments $Gait Training: 8-22 mins   PT G CodesEllouise Newer 03/20/2015, 6:47 PM Camille Bal [place], [place]

## 2015-03-20 NOTE — Progress Notes (Signed)
  Echocardiogram 2D Echocardiogram has been performed.  Darlina Sicilian M 03/20/2015, 11:08 AM

## 2015-03-20 NOTE — Progress Notes (Signed)
ANTICOAGULATION CONSULT NOTE - Follow Up Consult  Pharmacy Consult for Heparin  Indication: AVR/MVR, stroke  No Known Allergies  Patient Measurements: Height: 6' (182.9 cm) Weight: 180 lb (81.647 kg) IBW/kg (Calculated) : 77.6  Vital Signs: Temp: 98.6 F (37 C) (08/06 1014) Temp Source: Oral (08/06 1014) BP: 127/78 mmHg (08/06 1014) Pulse Rate: 84 (08/06 1014)  Labs:  Recent Labs  03/19/15 1446 03/19/15 1451 03/19/15 2256 03/20/15 0422 03/20/15 1417  HGB 13.6 11.9* 11.6* 11.9*  --   HCT 40.0 35.1* 34.4* 35.5*  --   PLT  --  300 289 290  --   APTT  --  28  --   --   --   LABPROT  --  16.2*  --  16.3*  --   INR  --  1.29  --  1.30  --   HEPARINUNFRC  --   --   --  0.15* 0.62  CREATININE 1.20 1.22  --   --   --     Estimated Creatinine Clearance: 70.7 mL/min (by C-G formula based on Cr of 1.22).  Assessment: 59 year old male on Coumadin PTA for Avr presented with CVA Heparin level = 0.62, slightly supra-therapeutic  Goal of Therapy:  Heparin level 0.3-0.5 units/ml Monitor platelets by anticoagulation protocol: Yes   Plan:  Decrease heparin to 1250 units / hr Follow up AM  Thank you Anette Guarneri, PharmD [phone] 03/20/2015,3:01 PM

## 2015-03-21 ENCOUNTER — Inpatient Hospital Stay (HOSPITAL_COMMUNITY): Payer: Medicaid Other

## 2015-03-21 DIAGNOSIS — G459 Transient cerebral ischemic attack, unspecified: Secondary | ICD-10-CM

## 2015-03-21 DIAGNOSIS — G450 Vertebro-basilar artery syndrome: Secondary | ICD-10-CM

## 2015-03-21 LAB — CBC
HEMATOCRIT: 38.7 % — AB (ref 39.0–52.0)
Hemoglobin: 12.9 g/dL — ABNORMAL LOW (ref 13.0–17.0)
MCH: 31.1 pg (ref 26.0–34.0)
MCHC: 33.3 g/dL (ref 30.0–36.0)
MCV: 93.3 fL (ref 78.0–100.0)
Platelets: 315 10*3/uL (ref 150–400)
RBC: 4.15 MIL/uL — ABNORMAL LOW (ref 4.22–5.81)
RDW: 12.6 % (ref 11.5–15.5)
WBC: 5.3 10*3/uL (ref 4.0–10.5)

## 2015-03-21 LAB — PROTIME-INR
INR: 1.84 — AB (ref 0.00–1.49)
PROTHROMBIN TIME: 21.2 s — AB (ref 11.6–15.2)

## 2015-03-21 LAB — HEPARIN LEVEL (UNFRACTIONATED)
Heparin Unfractionated: 0.83 IU/mL — ABNORMAL HIGH (ref 0.30–0.70)
Heparin Unfractionated: 0.33 IU/mL (ref 0.30–0.70)

## 2015-03-21 NOTE — Progress Notes (Signed)
VASCULAR LAB PRELIMINARY  PRELIMINARY  PRELIMINARY  PRELIMINARY  Carotid duplex completed.    Preliminary report:  1-39% ICA stenosis.  Vertebral artery flow is antegrade.   Britton Perkinson, RVT 03/21/2015, 11:41 AM

## 2015-03-21 NOTE — Progress Notes (Signed)
Ref: Patricia Nettle, MD   Subjective:  Feeling better. Ambulated well. Afebrile.  Objective:  Vital Signs in the last 24 hours: Temp:  [97.8 F (36.6 C)-98.6 F (37 C)] 98 F (36.7 C) (08/07 0510) Pulse Rate:  [80-100] 89 (08/07 0510) Cardiac Rhythm:  [-]  Resp:  [18-22] 22 (08/07 0510) BP: (97-127)/(72-84) 108/80 mmHg (08/07 0510) SpO2:  [97 %-100 %] 98 % (08/07 0510)  Physical Exam: BP Readings from Last 1 Encounters:  03/21/15 108/80    Wt Readings from Last 1 Encounters:  03/19/15 81.647 kg (180 lb)    Weight change:   HEENT: [place]/AT, Eyes-Brown, PERL, EOMI, Conjunctiva-Pink, Sclera-Non-icteric Neck: No JVD, No bruit, Trachea midline. Lungs:  Clear, Bilateral. Cardiac:  Regular rhythm, Prominent S1 and S2, no S3. II/VI systiolic murmur. Abdomen:  Soft, non-tender. Extremities:  No edema present. No cyanosis. No clubbing. CNS: AxOx3, Cranial nerves grossly intact, moves all 4 extremities. Right handed. Mild left sided weakness. Skin: Warm and dry.   Intake/Output from previous day: 08/06 0701 - 08/07 0700 In: -  Out: 225 [Urine:225]    Lab Results: BMET    Component Value Date/Time   NA 136 03/19/2015 1451   NA 139 03/19/2015 1446   NA 138 09/08/2014 0656   K 4.1 03/19/2015 1451   K 4.1 03/19/2015 1446   K 3.7 09/08/2014 0656   CL 103 03/19/2015 1451   CL 102 03/19/2015 1446   CL 105 09/08/2014 0656   CO2 25 03/19/2015 1451   CO2 25 09/08/2014 0656   CO2 25 09/07/2014 1655   GLUCOSE 102* 03/19/2015 1451   GLUCOSE 100* 03/19/2015 1446   GLUCOSE 95 09/08/2014 0656   BUN 18 03/19/2015 1451   BUN 22* 03/19/2015 1446   BUN 13 09/08/2014 0656   CREATININE 1.22 03/19/2015 1451   CREATININE 1.20 03/19/2015 1446   CREATININE 1.08 09/08/2014 0656   CALCIUM 9.6 03/19/2015 1451   CALCIUM 8.9 09/08/2014 0656   CALCIUM 9.6 09/07/2014 1655   GFRNONAA >60 03/19/2015 1451   GFRNONAA 73* 09/08/2014 0656   GFRNONAA 73* 09/07/2014 1655   GFRAA >60 03/19/2015  1451   GFRAA 84* 09/08/2014 0656   GFRAA 84* 09/07/2014 1655   CBC    Component Value Date/Time   WBC 5.3 03/21/2015 0810   RBC 4.15* 03/21/2015 0810   HGB 12.9* 03/21/2015 0810   HCT 38.7* 03/21/2015 0810   PLT 315 03/21/2015 0810   MCV 93.3 03/21/2015 0810   MCH 31.1 03/21/2015 0810   MCHC 33.3 03/21/2015 0810   RDW 12.6 03/21/2015 0810   LYMPHSABS 1.5 03/19/2015 1451   MONOABS 0.5 03/19/2015 1451   EOSABS 0.1 03/19/2015 1451   BASOSABS 0.0 03/19/2015 1451   HEPATIC Function Panel  Recent Labs  09/07/14 1655 09/08/14 0656 03/19/15 1451  PROT 7.3 6.4 7.4   HEMOGLOBIN A1C No components found for: HGA1C,  MPG CARDIAC ENZYMES Lab Results  Component Value Date   CKTOTAL 93 02/16/2011   CKMB 2.7 02/16/2011   TROPONINI <0.30 07/11/2013   TROPONINI <0.30 07/10/2013   TROPONINI <0.30 07/10/2013   BNP No results for input(s): PROBNP in the last 8760 hours. TSH No results for input(s): TSH in the last 8760 hours. CHOLESTEROL  Recent Labs  03/20/15 0422  CHOL 180    Scheduled Meds: . amLODipine  5 mg Oral Daily  . atorvastatin  20 mg Oral q1800  . hydrochlorothiazide  25 mg Oral Daily  . lisinopril  20 mg Oral Daily  .  nicotine  21 mg Transdermal Daily  . warfarin  15 mg Oral q1800  . Warfarin - Physician Dosing Inpatient   Does not apply q1800   Continuous Infusions: . 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 Home soon.   LOS: 2 days    Dixie Dials  MD  03/21/2015, 9:37 AM

## 2015-03-21 NOTE — Evaluation (Signed)
Occupational Therapy Evaluation Patient Details Name: Jeff [name] MRN: [id number] DOB: February 01, 1956 Today's Date: 03/21/2015    History of Present Illness 59 y.o. male admitted for Acute left middle cerebellar peduncle stroke. Hx of old stroke with resulting Lt sided weakness and ataxic gait, prosthetic MV, and prosthetic AV.   Clinical Impression   Pt is functioning at his baseline in mobility and ADL.   Educated in safety and fall prevention, s/s of CVA and to get immediate medical attention. Signing off.  Follow Up Recommendations  No OT follow up    Equipment Recommendations  None recommended by OT    Recommendations for Other Services       Precautions / Restrictions Precautions Precautions: Fall Restrictions Weight Bearing Restrictions: No      Mobility Bed Mobility Overal bed mobility: Modified Independent             General bed mobility comments: HOB up, no assist  Transfers Overall transfer level: Modified independent Equipment used: Straight cane             General transfer comment: Uses cane for stability    Balance                                            ADL Overall ADL's : At baseline                                             Vision     Perception     Praxis      Pertinent Vitals/Pain Pain Assessment: No/denies pain     Hand Dominance Right   Extremity/Trunk Assessment Upper Extremity Assessment Upper Extremity Assessment: RUE deficits/detail;LUE deficits/detail RUE Deficits / Details: 5/5, good coordination, intact sensation, symptoms have resolved LUE Deficits / Details: full AROM, 4/5 strength, longstanding impairment from previous CVA LUE Coordination: decreased fine motor;decreased gross motor   Lower Extremity Assessment Lower Extremity Assessment: Defer to PT evaluation       Communication Communication Communication: No difficulties   Cognition Arousal/Alertness:  Awake/alert Behavior During Therapy: WFL for tasks assessed/performed Overall Cognitive Status: Within Functional Limits for tasks assessed                     General Comments       Exercises       Shoulder Instructions      Home Living Family/patient expects to be discharged to:: Private residence Living Arrangements: Alone Available Help at Discharge: Personal care attendant (5 days a week for 2 hours) Type of Home: Apartment Home Access: Stairs to enter [employer] of Steps: 1 Entrance Stairs-Rails: None Home Layout: One level     Bathroom Shower/Tub: Teacher, early years/pre: Standard     Home Equipment: Cane - single point;Shower seat;Grab bars - toilet;Grab bars - tub/shower   Additional Comments: stands to shower  Lives With: Alone    Prior Functioning/Environment Level of Independence: Independent with assistive device(s)        Comments: Uses cane for mobility, catches the bus for transportation. Aide assists with house chores.    OT Diagnosis:     OT Problem List:     OT Treatment/Interventions:      OT Goals(Current goals can be  found in the care plan section) Acute Rehab OT Goals Patient Stated Goal: Go back home  OT Frequency:     Barriers to D/C:            Co-evaluation              End of Session    Activity Tolerance: Patient tolerated treatment well Patient left: in bed;with call bell/phone within reach;with bed alarm set   Time: 4949-4473 OT Time Calculation (min): 14 min Charges:  OT General Charges $OT Visit: 1 Procedure OT Evaluation $Initial OT Evaluation Tier I: 1 Procedure G-Codes:    Jeff [name] 03/21/2015, 12:20 PM

## 2015-03-21 NOTE — Progress Notes (Signed)
STROKE TEAM PROGRESS NOTE   HISTORY Jeff [name] is a 59 y.o. male who reports that he was walking to the ATM this morning and lost control of his cane that he usually holds in his right hand. When he attempted to pick it up he was unable to because he couldn't control his right hand. He went to the store and was unable to open his wallet again because of his right hand. When he attempted to speak his speech was slurred. The patient presented at that time. Symptoms have reoslved. Patient on Coumadin due to aortic and mitral valve replacement but INR is normal today.   Date last known well: Date: 03/19/2015 Time last known well: Time: 07:00 tPA Given: No: Resolution of symptoms   SUBJECTIVE (INTERVAL HISTORY) No family members present. The patient is still having some difficulties with his speech but he feels he is improving. Otherwise no complaints.   OBJECTIVE Temp:  [97.8 F (36.6 C)-99 F (37.2 C)] 99 F (37.2 C) (08/07 1330) Pulse Rate:  [87-100] 99 (08/07 1330) Cardiac Rhythm:  [-]  Resp:  [18-22] 20 (08/07 1330) BP: (97-116)/(72-89) 105/79 mmHg (08/07 1330) SpO2:  [97 %-100 %] 99 % (08/07 1330)  No results for input(s): GLUCAP in the last 168 hours.  Recent Labs Lab 03/19/15 1446 03/19/15 1451  NA 139 136  K 4.1 4.1  CL 102 103  CO2  --  25  GLUCOSE 100* 102*  BUN 22* 18  CREATININE 1.20 1.22  CALCIUM  --  9.6    Recent Labs Lab 03/19/15 1451  AST 25  ALT 19  ALKPHOS 74  BILITOT 0.5  PROT 7.4  ALBUMIN 4.3    Recent Labs Lab 03/19/15 1446 03/19/15 1451 03/19/15 2256 03/20/15 0422 03/21/15 0810  WBC  --  6.7 5.9 4.9 5.3  NEUTROABS  --  4.6  --   --   --   HGB 13.6 11.9* 11.6* 11.9* 12.9*  HCT 40.0 35.1* 34.4* 35.5* 38.7*  MCV  --  93.4 93.0 93.7 93.3  PLT  --  300 289 290 315   No results for input(s): CKTOTAL, CKMB, CKMBINDEX, TROPONINI in the last 168 hours.  Recent Labs  03/19/15 1451 03/20/15 0422 03/21/15 0810  LABPROT 16.2* 16.3*  21.2*  INR 1.29 1.30 1.84*   No results for input(s): COLORURINE, LABSPEC, PHURINE, GLUCOSEU, HGBUR, BILIRUBINUR, KETONESUR, PROTEINUR, UROBILINOGEN, NITRITE, LEUKOCYTESUR in the last 72 hours.  Invalid input(s): APPERANCEUR     Component Value Date/Time   CHOL 180 03/20/2015 0422   TRIG 79 03/20/2015 0422   HDL 49 03/20/2015 0422   CHOLHDL 3.7 03/20/2015 0422   VLDL 16 03/20/2015 0422   LDLCALC 115* 03/20/2015 0422   Lab Results  Component Value Date   HGBA1C 5.2 07/12/2013      Component Value Date/Time   LABOPIA NONE DETECTED 07/10/2013 1401   COCAINSCRNUR NONE DETECTED 07/10/2013 1401   LABBENZ NONE DETECTED 07/10/2013 1401   AMPHETMU NONE DETECTED 07/10/2013 1401   THCU POSITIVE* 07/10/2013 1401   LABBARB NONE DETECTED 07/10/2013 1401    No results for input(s): ETH in the last 168 hours.   Imaging  Ct Head Wo Contrast 03/19/2015    Sequelae of bilateral cerebral and cerebellar hemisphere infarcts. No acute cortically based infarct or new intracranial abnormality identified.       Mr Brain Wo Contrast (neuro Protocol) 03/19/2015    Small area of acute infarction affects the LEFT middle cerebellar peduncle. It is unclear  how this correlates with the patient's reported speech difficulty and RIGHT arm weakness.  There is widespread chronic infarction throughout the cerebral hemispheres and cerebellum. Generalized atrophy with small vessel disease.       PHYSICAL EXAM Pleasant middle aged male not in distress. . Afebrile. Head is nontraumatic. Neck is supple without bruit.    Cardiac exam no murmur or gallop. Lungs are clear to auscultation. Distal pulses are well felt. Neurological Exam ;  Awake  Alert oriented x 3. Normal speech and language except for mild intermittent dysarthria.eye movements full without nystagmus.fundi were not visualized. Vision acuity and fields appear normal. Hearing is normal. Palatal movements are normal. Face symmetric. Tongue midline. Normal  strength, tone, reflexes and coordination except for mild  Diminished fine finger movements on right and orbits left over right upper extremity. Normal sensation. Gait deferred.   ASSESSMENT/PLAN Jeff [name] is a 59 y.o. male with history of previous CVA, hypertension, mitral and aortic valve replacements on Coumadin, presenting with slurred speech and right upper extremity weakness. He did not receive IV t-PA due to resolution of deficits.  Stroke:  Dominant probably embolic secondary to subtherapeutic Coumadin level with history of AVR and MVR.  Resultant resolution of deficits  MRI  Small area of acute infarction affects the LEFT middle cerebellar peduncle.   MRA  not performed  Carotid Doppler - 1-39% ICA stenosis. Vertebral artery flow is antegrade.   2D Echo EF 45-40%. No cardiac source of emboli identified.  LDL 115  HgbA1c pending  IV heparin for VTE prophylaxis Diet Heart Room service appropriate?: Yes; Fluid consistency:: Thin  warfarin prior to admission, now on warfarin and heparin  Patient counseled to be compliant with his antithrombotic medications  Ongoing aggressive stroke risk factor management  Therapy recommendations: No follow-up therapies recommended  Disposition: Pending  Hypertension  Home meds: Norvasc, hydrochlorothiazide, and lisinopril.  Blood pressure mildly low at times  Patient counseled to be compliant with his blood pressure medications  Hyperlipidemia  Home meds: No lipid lowering medications prior to admission.  LDL 115, goal < 70  Now on Lipitor 20 mg daily  Continue statin at discharge   Other Stroke Risk Factors  Advanced age  Cigarette smoker, advised to stop smoking  ETOH use  Hx stroke/TIA  Prosthetic heart valves on Coumadin   Other Active Problems  Mild anemia  Heparin and Coumadin dosing per pharmacy ( INR - 1.84 Sunday )  UDS positive for [facility]   PLAN  The stroke team will sign off at this  time.  Follow-up with Dr. Leonie Man in 2 months     Hospital day # [place] [facility] Pager [phone] 03/21/2015, 2:31 PM    He has presented with slurred speech and right sided weakness.due to left cerebellar infarcts of cardioembolic etiology with mechanical heart valve with suboptimal anticoagulation. He  remains at risk for neurological worsening, recurrent stroke/TIAs and needs ongoing stroke evaluation and aggressive risk factor modification. Agree with bridging iv heparin and continuing warfarin with INR goal of 2.5-3.5. He was counselled to quit marijuana. Antony Contras, MD    To contact Stroke Continuity provider, please refer to [email or web address] After hours, contact General Neurology

## 2015-03-21 NOTE — Progress Notes (Signed)
ANTICOAGULATION CONSULT NOTE - Follow Up Consult  Pharmacy Consult for Heparin / Coumadin per MD Indication: AVR/MVR, stroke  No Known Allergies  Patient Measurements: Height: 6' (182.9 cm) Weight: 180 lb (81.647 kg) IBW/kg (Calculated) : 77.6  Vital Signs: Temp: 98.4 F (36.9 C) (08/07 1058) Temp Source: Oral (08/07 1058) BP: 115/89 mmHg (08/07 1058) Pulse Rate: 96 (08/07 1058)  Labs:  Recent Labs  03/19/15 1446  03/19/15 1451 03/19/15 2256 03/20/15 0422 03/20/15 1417 03/21/15 0810  HGB 13.6  --  11.9* 11.6* 11.9*  --  12.9*  HCT 40.0  --  35.1* 34.4* 35.5*  --  38.7*  PLT  --   < > 300 289 290  --  315  APTT  --   --  28  --   --   --   --   LABPROT  --   --  16.2*  --  16.3*  --  21.2*  INR  --   --  1.29  --  1.30  --  1.84*  HEPARINUNFRC  --   --   --   --  0.15* 0.62 0.83*  CREATININE 1.20  --  1.22  --   --   --   --   < > = values in this interval not displayed.  Estimated Creatinine Clearance: 70.7 mL/min (by C-G formula based on Cr of 1.22).  Assessment: 59 year old male on Coumadin PTA for AVR/MVR presented with CVA Heparin level = 0.83, supra-therapeutic INR trended up to 1.84 (goal = 2.5 to 3.5)  Goal of Therapy:  Heparin level 0.3-0.5 units/ml Monitor platelets by anticoagulation protocol: Yes   Plan:  Decrease heparin to 1000 units / hr Follow up 8 hour heparin level  Thank you Anette Guarneri, PharmD [phone] 03/21/2015,11:02 AM

## 2015-03-21 NOTE — Progress Notes (Signed)
ANTICOAGULATION CONSULT NOTE - Follow Up Consult  Pharmacy Consult for Heparin Indication: AVR/MVR, stroke  No Known Allergies  Patient Measurements: Height: 6' (182.9 cm) Weight: 180 lb (81.647 kg) IBW/kg (Calculated) : 77.6  Vital Signs: Temp: 99.1 F (37.3 C) (08/07 1730) Temp Source: Oral (08/07 1730) BP: 108/89 mmHg (08/07 1730) Pulse Rate: 100 (08/07 1730)  Labs:  Recent Labs  03/19/15 1446  03/19/15 1451 03/19/15 2256  03/20/15 0422 03/20/15 1417 03/21/15 0810 03/21/15 1945  HGB 13.6  --  11.9* 11.6*  --  11.9*  --  12.9*  --   HCT 40.0  --  35.1* 34.4*  --  35.5*  --  38.7*  --   PLT  --   < > 300 289  --  290  --  315  --   APTT  --   --  28  --   --   --   --   --   --   LABPROT  --   --  16.2*  --   --  16.3*  --  21.2*  --   INR  --   --  1.29  --   --  1.30  --  1.84*  --   HEPARINUNFRC  --   --   --   --   < > 0.15* 0.62 0.83* 0.33  CREATININE 1.20  --  1.22  --   --   --   --   --   --   < > = values in this interval not displayed.  Estimated Creatinine Clearance: 70.7 mL/min (by C-G formula based on Cr of 1.22).  Assessment: 59 year old male on Coumadin PTA for AVR/MVR presented with CVA.  Heparin level was high this morning and rate decreased. Now heparin level therapeutic at 0.33.  No bleeding noted  Goal of Therapy:  Heparin level 0.3-0.5 units/ml Monitor platelets by anticoagulation protocol: Yes   Plan:  -continue heparin at 1000 units / hr -daily HL and CBC -warfarin per MD as ordered  Katie Moch D. Mihailo Sage, PharmD, BCPS Clinical Pharmacist Pager: [phone] 03/21/2015 8:49 PM

## 2015-03-22 LAB — CBC
HEMATOCRIT: 38.1 % — AB (ref 39.0–52.0)
Hemoglobin: 13.1 g/dL (ref 13.0–17.0)
MCH: 32.2 pg (ref 26.0–34.0)
MCHC: 34.4 g/dL (ref 30.0–36.0)
MCV: 93.6 fL (ref 78.0–100.0)
Platelets: 312 10*3/uL (ref 150–400)
RBC: 4.07 MIL/uL — AB (ref 4.22–5.81)
RDW: 12.6 % (ref 11.5–15.5)
WBC: 5 10*3/uL (ref 4.0–10.5)

## 2015-03-22 LAB — HEPARIN LEVEL (UNFRACTIONATED): Heparin Unfractionated: 0.19 IU/mL — ABNORMAL LOW (ref 0.30–0.70)

## 2015-03-22 LAB — HEMOGLOBIN A1C
HEMOGLOBIN A1C: 5.4 % (ref 4.8–5.6)
MEAN PLASMA GLUCOSE: 108 mg/dL

## 2015-03-22 LAB — PROTIME-INR
INR: 2.38 — AB (ref 0.00–1.49)
PROTHROMBIN TIME: 25.7 s — AB (ref 11.6–15.2)

## 2015-03-22 MED ORDER — WARFARIN SODIUM 5 MG PO TABS
10.0000 mg | ORAL_TABLET | Freq: Every day | ORAL | Status: DC
  Administered 2015-03-22 – 2015-03-23 (×2): 10 mg via ORAL
  Filled 2015-03-22 (×2): qty 2

## 2015-03-22 MED ORDER — WARFARIN SODIUM 7.5 MG PO TABS: 7.5000 mg | ORAL_TABLET | Freq: Every day | ORAL | Status: DC

## 2015-03-22 NOTE — Progress Notes (Signed)
Ref: Patricia Nettle, MD   Subjective:  Feeling better. INR-2.38  Objective:  Vital Signs in the last 24 hours: Temp:  [98 F (36.7 C)-99.1 F (37.3 C)] 98 F (36.7 C) (08/08 0555) Pulse Rate:  [86-100] 92 (08/08 0555) Cardiac Rhythm:  [-] Normal sinus rhythm (08/07 1100) Resp:  [20] 20 (08/08 0555) BP: (105-115)/(73-89) 110/77 mmHg (08/08 0555) SpO2:  [99 %-100 %] 99 % (08/08 0555)  Physical Exam: BP Readings from Last 1 Encounters:  03/22/15 110/77    Wt Readings from Last 1 Encounters:  03/19/15 81.647 kg (180 lb)    Weight change:   HEENT: [place]/AT, Eyes-Brown, PERL, EOMI, Conjunctiva-Pink, Sclera-Non-icteric Neck: No JVD, No bruit, Trachea midline. Lungs:  Clear, Bilateral. Cardiac:  Regular rhythm, Prominent S1 and S2, no S3. II/VI systolic murmur. Abdomen:  Soft, non-tender. Extremities:  No edema present. No cyanosis. No clubbing. CNS: AxOx3, Cranial nerves grossly intact, moves all 4 extremities. Right handed. Left sided weakness as before. Skin: Warm and dry.   Intake/Output from previous day: 08/07 0701 - 08/08 0700 In: 960 [P.O.:960] Out: [place] [Urine:875]    Lab Results: BMET    Component Value Date/Time   NA 136 03/19/2015 1451   NA 139 03/19/2015 1446   NA 138 09/08/2014 0656   K 4.1 03/19/2015 1451   K 4.1 03/19/2015 1446   K 3.7 09/08/2014 0656   CL 103 03/19/2015 1451   CL 102 03/19/2015 1446   CL 105 09/08/2014 0656   CO2 25 03/19/2015 1451   CO2 25 09/08/2014 0656   CO2 25 09/07/2014 1655   GLUCOSE 102* 03/19/2015 1451   GLUCOSE 100* 03/19/2015 1446   GLUCOSE 95 09/08/2014 0656   BUN 18 03/19/2015 1451   BUN 22* 03/19/2015 1446   BUN 13 09/08/2014 0656   CREATININE 1.22 03/19/2015 1451   CREATININE 1.20 03/19/2015 1446   CREATININE 1.08 09/08/2014 0656   CALCIUM 9.6 03/19/2015 1451   CALCIUM 8.9 09/08/2014 0656   CALCIUM 9.6 09/07/2014 1655   GFRNONAA >60 03/19/2015 1451   GFRNONAA 73* 09/08/2014 0656   GFRNONAA 73* 09/07/2014  1655   GFRAA >60 03/19/2015 1451   GFRAA 84* 09/08/2014 0656   GFRAA 84* 09/07/2014 1655   CBC    Component Value Date/Time   WBC 5.0 03/22/2015 0752   RBC 4.07* 03/22/2015 0752   HGB 13.1 03/22/2015 0752   HCT 38.1* 03/22/2015 0752   PLT 312 03/22/2015 0752   MCV 93.6 03/22/2015 0752   MCH 32.2 03/22/2015 0752   MCHC 34.4 03/22/2015 0752   RDW 12.6 03/22/2015 0752   LYMPHSABS 1.5 03/19/2015 1451   MONOABS 0.5 03/19/2015 1451   EOSABS 0.1 03/19/2015 1451   BASOSABS 0.0 03/19/2015 1451   HEPATIC Function Panel  Recent Labs  09/07/14 1655 09/08/14 0656 03/19/15 1451  PROT 7.3 6.4 7.4   HEMOGLOBIN A1C No components found for: HGA1C,  MPG CARDIAC ENZYMES Lab Results  Component Value Date   CKTOTAL 93 02/16/2011   CKMB 2.7 02/16/2011   TROPONINI <0.30 07/11/2013   TROPONINI <0.30 07/10/2013   TROPONINI <0.30 07/10/2013   BNP No results for input(s): PROBNP in the last 8760 hours. TSH No results for input(s): TSH in the last 8760 hours. CHOLESTEROL  Recent Labs  03/20/15 0422  CHOL 180    Scheduled Meds: . amLODipine  5 mg Oral Daily  . atorvastatin  20 mg Oral q1800  . hydrochlorothiazide  25 mg Oral Daily  . lisinopril  20  mg Oral Daily  . nicotine  21 mg Transdermal Daily  . warfarin  10 mg Oral q1800  . 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 Home in AM.     LOS: 3 days    Dixie Dials  MD  03/22/2015, 9:21 AM

## 2015-03-22 NOTE — Discharge Instructions (Signed)
Information on my medicine - Coumadin   (Warfarin)  This medication education was reviewed with me or my healthcare representative as part of my discharge preparation.  The pharmacist that spoke with me during my hospital stay was:  Payten Hobin, Julaine Hua, [facility]  Why was Coumadin prescribed for you? Coumadin was prescribed for you because you have a blood clot or a medical condition that can cause an increased risk of forming blood clots. Blood clots can cause serious health problems by blocking the flow of blood to the heart, lung, or brain. Coumadin can prevent harmful blood clots from forming. As a reminder your indication for Coumadin is:   Blood Clot Prevention After Heart Valve Surgery  What test will check on my response to Coumadin? While on Coumadin (warfarin) you will need to have an INR test regularly to ensure that your dose is keeping you in the desired range. The INR (international normalized ratio) number is calculated from the result of the laboratory test called prothrombin time (PT).  If an INR APPOINTMENT HAS NOT ALREADY BEEN MADE FOR YOU please schedule an appointment to have this lab work done by your health care provider within 7 days. Your INR goal is usually a number between:  2 to 3 or your provider may give you a more narrow range like 2-2.5.  Ask your health care provider during an office visit what your goal INR is.  What  do you need to  know  About  COUMADIN? Take Coumadin (warfarin) exactly as prescribed by your healthcare provider about the same time each day.  DO NOT stop taking without talking to the doctor who prescribed the medication.  Stopping without other blood clot prevention medication to take the place of Coumadin may increase your risk of developing a new clot or stroke.  Get refills before you run out.  What do you do if you miss a dose? If you miss a dose, take it as soon as you remember on the same day then continue your regularly scheduled regimen the next  day.  Do not take two doses of Coumadin at the same time.  Important Safety Information A possible side effect of Coumadin (Warfarin) is an increased risk of bleeding. You should call your healthcare provider right away if you experience any of the following: ? Bleeding from an injury or your nose that does not stop. ? Unusual colored urine (red or dark brown) or unusual colored stools (red or black). ? Unusual bruising for unknown reasons. ? A serious fall or if you hit your head (even if there is no bleeding).  Some foods or medicines interact with Coumadin (warfarin) and might alter your response to warfarin. To help avoid this: ? Eat a balanced diet, maintaining a consistent amount of Vitamin K. ? Notify your provider about major diet changes you plan to make. ? Avoid alcohol or limit your intake to 1 drink for women and 2 drinks for men per day. (1 drink is 5 oz. wine, 12 oz. beer, or 1.5 oz. liquor.)  Make sure that ANY health care provider who prescribes medication for you knows that you are taking Coumadin (warfarin).  Also make sure the healthcare provider who is monitoring your Coumadin knows when you have started a new medication including herbals and non-prescription products.  Coumadin (Warfarin)  Major Drug Interactions  Increased Warfarin Effect Decreased Warfarin Effect  Alcohol (large quantities) Antibiotics (esp. Septra/Bactrim, Flagyl, Cipro) Amiodarone (Cordarone) Aspirin (ASA) Cimetidine (Tagamet) Megestrol (Megace) NSAIDs (  ibuprofen, naproxen, etc.) °Piroxicam (Feldene) °Propafenone (Rythmol SR) °Propranolol (Inderal) °Isoniazid (INH) °Posaconazole (Noxafil) Barbiturates (Phenobarbital) °Carbamazepine (Tegretol) °Chlordiazepoxide (Librium) °Cholestyramine (Questran) °Griseofulvin °Oral Contraceptives °Rifampin °Sucralfate (Carafate) °Vitamin K  ° °Coumadin® (Warfarin) Major Herbal Interactions  °Increased Warfarin Effect Decreased Warfarin Effect   °Garlic °Ginseng °Ginkgo biloba Coenzyme Q10 °Green tea °St. John’s wort   ° °Coumadin® (Warfarin) FOOD Interactions  °Eat a consistent number of servings per week of foods HIGH in Vitamin K °(1 serving = ½ cup)  °Collards (cooked, or boiled & drained) °Kale (cooked, or boiled & drained) °Mustard greens (cooked, or boiled & drained) °Parsley *serving size only = ¼ cup °Spinach (cooked, or boiled & drained) °Swiss chard (cooked, or boiled & drained) °Turnip greens (cooked, or boiled & drained)  °Eat a consistent number of servings per week of foods MEDIUM-HIGH in Vitamin K °(1 serving = 1 cup)  °Asparagus (cooked, or boiled & drained) °Broccoli (cooked, boiled & drained, or raw & chopped) °Brussel sprouts (cooked, or boiled & drained) *serving size only = ½ cup °Lettuce, raw (green leaf, endive, romaine) °Spinach, raw °Turnip greens, raw & chopped  ° °These websites have more information on Coumadin (warfarin):  www.coumadin.com; °www.ahrq.gov/consumer/coumadin.htm; ° ° °

## 2015-03-23 LAB — PROTIME-INR
INR: 2.59 — ABNORMAL HIGH (ref 0.00–1.49)
PROTHROMBIN TIME: 27.4 s — AB (ref 11.6–15.2)

## 2015-03-23 MED ORDER — ATORVASTATIN CALCIUM 20 MG PO TABS: 20.0000 mg | ORAL_TABLET | Freq: Every day | ORAL | Status: DC

## 2015-03-23 NOTE — Progress Notes (Signed)
Discharge orders received, pt for discharge home today, IV and telemetry  D/C.  D/C instructions and Rx given with verbalized understanding.  Family at bedside to assist pt with discharge. Staff brought pt downstairs via wheelchair.  

## 2015-03-23 NOTE — Discharge Summary (Signed)
Physician Discharge Summary  Patient ID: Jeff [name] MRN: [id number] DOB/AGE: 61-Oct-1957 61 y.o.  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 Blood work showed sub-therapeutic INR. MRI/MRA brain showed small left middle cerebellar peduncle stroke + widespread chronic bilateral cerebral and cerebellar infarctions. Neurology consult of Dr. Alexis Goodell was obtained. He was given IV heparin and PO coumadin till INR of 2.5 was reached. His right hand weakness and speech problem improved. He was discharged home with follow up by his primary care and by Dr. Montez Morita in 1 month.  Consults: cardiology and neurology  Significant Diagnostic Studies: labs: Near normal CBC, LFT, Troponin-I and electrolytes.  EKG-NSR.  MRI of head showed : Small area of acute infarction affects the LEFT middle cerebellar peduncle. There is widespread chronic infarction throughout the cerebral hemispheres and cerebellum. Generalized atrophy with small vessel disease.  Carotid Duplex: Bilateral: intimal wall thickening CCA. Mild soft plaque origin ICA. 1-39% ICA stenosis. Vertebral artery flow is antegrade.  Echocardiogram: Left ventricle: The cavity size was normal. Systolic function was moderately reduced. The estimated ejection fraction was in the range of 35% to 40%. Diffuse hypokinesis. Doppler parameters are  consistent with abnormal left ventricular relaxation (grade 1 diastolic dysfunction). Cannot exclude thrombus.  Aortic valve: A mechanical prosthesis was present. Transvalvular velocity was increased. There was mild stenosis. There was mild regurgitation. Valve area (VTI): 1.89 cm^2. Valve area (Vmax): 1.51 cm^2. Valve area (Vmean): 1.68 cm^2. - Mitral valve: A bileaflet mechanical prosthesis was present. ,with involvement of anterior leaflet chords, consistent with postoperative changes. Transvalvular velocity was minimally increased. The findings are consistent with mild stenosis. There was mild regurgitation. Valve area by continuity equation (using LVOT flow): 2.43 cm^2. - Left atrium: The atrium was moderately to severely dilated. Right ventricle: Systolic function was mildly to moderately reduced. - Right atrium: The atrium was mildly dilated. - Pericardium, extracardiac: A trivial pericardial effusion was identified.  Treatments: cardiac meds: lisinopril (Prinivil), amlodipine and Atorvastatin and anticoagulation: warfarin  Discharge Exam: Blood pressure 120/74, pulse 100, temperature 99.4 F (37.4 C), temperature source Oral, resp. rate 20, height 6' (1.829 m), weight 81.647 kg (180 lb), SpO2 100 %. HEENT: [place]/AT, Eyes-Brown, PERL, EOMI, Conjunctiva-Pink, Sclera-Non-icteric Neck: No JVD, No bruit, Trachea midline. Lungs: Clear, Bilateral. Cardiac: Regular rhythm, Prominent S1 and S2, no S3. II/VI systolic murmur. Abdomen: Soft, non-tender. Extremities: No edema present. No cyanosis. No clubbing. CNS: AxOx3, Cranial nerves grossly intact, moves all 4 extremities. Right handed. Left sided weakness as before. Skin: Warm and dry.  Disposition: 01-Home or Self Care  Discharge Instructions    Ambulatory referral to Neurology    Complete by:  As directed   Dr. Leonie Man requests follow up for this patient in 2 months.            Medication List    TAKE these medications         amLODipine 5 MG tablet  Commonly known as:  NORVASC  Take 5 mg  by mouth daily.     atorvastatin 20 MG tablet  Commonly known as:  LIPITOR  Take 1 tablet (20 mg total) by mouth daily at 6 PM.     hydrochlorothiazide 25 MG tablet  Commonly known as:  HYDRODIURIL  Take 25 mg by mouth daily.     lisinopril 20 MG tablet  Commonly known as:  PRINIVIL,ZESTRIL  Take 20 mg by mouth daily.     nicotine 21 mg/24hr patch  Commonly known as:  NICODERM CQ - dosed in mg/24 hours  Place 1 patch (21 mg total) onto the skin daily.     warfarin 10 MG tablet  Commonly known as:  COUMADIN  Take 1 tablet (10 mg total) by mouth daily at 6 PM.           Follow-up Information    Follow up with SETHI,PRAMOD, MD. Schedule an appointment as soon as possible for a visit in 2 months.   Specialties:  Neurology, Radiology   Contact information:   [address] [place] [address] [phone]       Follow up with Patricia Nettle, MD. Schedule an appointment as soon as possible for a visit in 1 week.   Specialty:  Cardiology   Contact information:   [place] [address] [phone]       Signed: Birdie Riddle 03/23/2015, 6:00 PM

## 2015-04-15 ENCOUNTER — Encounter: Payer: Self-pay | Admitting: Physical Therapy

## 2015-04-15 NOTE — Therapy (Signed)
[place] [address] [place], [place], [address] Phone: [phone]   Fax:  [phone]  Patient Details  Name: Jeff [name] MRN: [id number] Date of Birth: 1956/02/23 Referring Provider:  No ref. provider found  Encounter Date: 04/15/2015  PHYSICAL THERAPY DISCHARGE SUMMARY  Visits from Start of Care: 1  Current functional level related to goals / functional outcomes: N/A, as pt did not return to PT after eval on 12/24/14.   Remaining deficits: See eval   Education / Equipment: Unable to complete, as pt did not return after PT evaluation.  Plan: Patient agrees to discharge.  Patient goals were not met. Patient is being discharged due to not returning since the last visit.  ?????     MARRIOTT,AMY W. 04/15/2015, 10:07 AM  [place] [address] [place], [place], [address] Phone: [phone]   Fax:  [phone]  Frazier Butt., PT

## 2015-05-26 ENCOUNTER — Encounter: Payer: Self-pay | Admitting: Neurology

## 2015-05-26 ENCOUNTER — Ambulatory Visit (INDEPENDENT_AMBULATORY_CARE_PROVIDER_SITE_OTHER): Payer: Medicaid Other | Admitting: Neurology

## 2015-05-26 VITALS — BP 110/78 | HR 98 | Ht 72.0 in | Wt 185.0 lb

## 2015-05-26 DIAGNOSIS — I63112 Cerebral infarction due to embolism of left vertebral artery: Secondary | ICD-10-CM

## 2015-05-26 NOTE — Progress Notes (Signed)
[facility] [address] [place]. [place] [address] [phone]       OFFICE FOLLOW-UP NOTE  Jeff [name] Date of Birth:  [birth year] Medical Record Number:  [id number]   HPI: Jeff [name] is a 62 year African-American male seen today for follow-up for first office visit following hospital admission for stroke in August 2016.Jeff [name] is a 59 y.o. male who reported that he was walking to the ATM on 03/19/15 morning and lost control of his cane that he usually holds in his right hand. When he attempted to pick it up he was unable to because he couldn't control his right hand. He went to the store and was unable to open his wallet again because of his right hand. When he attempted to speak his speech was slurred. The patient presented at that time. Symptoms have reoslved. Patient on Coumadin due to aortic and mitral valve replacement but INR is normal today.  Date last known well: Date: 03/19/2015 Time last known well: Time: 07:00 tPA Given: No: Resolution of symptoms. CT scan of the head showed no acute infarct but showed old bilateral cerebral and cerebellar hemispheric infarcts. MRI scan showed an acute small left middle cerebellar peduncle infarct and multiple old bilateral strokes. Transthoracic echo showed ejection fraction of 45-40% with a mechanical heart valve. Carotid Doppler showed no significant extracranial stenosis. LDL cholesterol was elevated at 115 mg percent. Hemoglobin A1c was 5.4. Patient's anti-coagulation was suboptimal and he was started on warfarin and has done well since discharge. He states his gait and balance remained poor but this is pre-existing. His right hand weakness has improved. His blood pressure is well controlled and today it is 110/70. He states his INR do fluctuate a little bit but is tolerating warfarin well without bleeding or bruising. He has his blood checked every [facility]. Is tolerating Pravachol well without side  effects. His tongue to quit smoking and has cut down to 1-2 cigarettes per day and is on nicotine patch.  ROS:   14 system review of systems is positive for  mild gait difficulties and balance problems and all other systems negative.  PMH:  Past Medical History  Diagnosis Date  . History of CVA (cerebrovascular accident)     02/2011  . Hx of aortic valve replacement, mechanical   . H/O mitral valve replacement with mechanical valve   . HX: long term anticoagulant use   . High cholesterol   . Heart disease   . Stroke [facility])     Social History:  Social History   Social History  . Marital Status: Single    Spouse Name: N/A  . Number of Children: N/A  . Years of Education: N/A   Occupational History  . Not on file.   Social History Main Topics  . Smoking status: Current Every Day Smoker -- 0.05 packs/day    Types: Cigarettes  . Smokeless tobacco: Not on file  . Alcohol Use: 8.4 oz/week    14 Cans of beer per week     Comment: drinks weekly  . Drug Use: 7.00 per week    Special: Marijuana  . Sexual Activity: Yes   Other Topics Concern  . Not on file   Social History Narrative    Medications:   Current Outpatient Prescriptions on File Prior to Visit  Medication Sig Dispense Refill  . amLODipine (NORVASC) 5 MG tablet Take 5 mg by mouth daily.  1  . hydrochlorothiazide (HYDRODIURIL) 25  MG tablet Take 25 mg by mouth daily.  1  . lisinopril (PRINIVIL,ZESTRIL) 20 MG tablet Take 20 mg by mouth daily.  0  . nicotine (NICODERM CQ - DOSED IN MG/24 HOURS) 21 mg/24hr patch Place 1 patch (21 mg total) onto the skin daily. 28 patch 0  . warfarin (COUMADIN) 10 MG tablet Take 1 tablet (10 mg total) by mouth daily at 6 PM. (Patient taking differently: Take 5-10 mg by mouth daily at 6 PM. Take 5 mg only on Wednesday Take 10 mg all other days) 30 tablet 1   No current facility-administered medications on file prior to visit.    Allergies:  No Known Allergies  Physical  Exam General: well developed, well nourished, seated, in no evident distress Head: head normocephalic and atraumatic.  Neck: supple with no carotid or supraclavicular bruits Cardiovascular: regular rate and rhythm, no murmurs Musculoskeletal: no deformity Skin:  no rash/petichiae Vascular:  Normal pulses all extremities Filed Vitals:   05/26/15 1518  BP: 110/78  Pulse: 98   Neurologic Exam Mental Status: Awake and fully alert. Oriented to place and time. Recent and remote memory intact. Attention span, concentration and fund of knowledge appropriate. Mood and affect appropriate.  Cranial Nerves: Fundoscopic exam reveals sharp disc margins. Pupils equal, briskly reactive to light. Extraocular movements full without nystagmus mild saccadic dysmetria on right gaze Visual fields full to confrontation. Hearing intact. Facial sensation intact. Face, tongue, palate moves normally and symmetrically.  Motor: Normal bulk and tone. Normal strength in all tested extremity muscles. Diminished fine finger movements on the right. Orbits left over right approximately. Sensory.: intact to touch ,pinprick .position and vibratory sensation.  Coordination: Rapid alternating movements normal in all extremities. Finger-to-nose and heel-to-shin performed accurately bilaterally. Gait and Station: Arises from chair without difficulty. Stance is normal. Gait demonstrates normal stride length and mild imbalance uses a cane.. Not able to heel, toe and tandem walk without difficulty.  Reflexes: 1+ and symmetric. Toes downgoing.   NIHSS 0 Modified Rankin 2  ASSESSMENT: 32 year African-American male with small left cerebellar infarct in August 2016 secondary to cardioembolic embolism from mechanical heart valve with suboptimal anticoagulation. Multiple vascular risk factors of mechanical heart valve, hypertension, hyperlipidemia and smoking.    PLAN: I had a long d/w patient about his recent stroke, risk for  recurrent stroke/TIAs, personally independently reviewed imaging studies and stroke evaluation results and answered questions.Continue warfarin  for secondary stroke prevention given mechanical heart valve and maintain strict control of hypertension with blood pressure goal below 130/90, diabetes with hemoglobin A1c goal below 6.5% and lipids with LDL cholesterol goal below 100 mg/dL. I also counseled him to quit smoking completely. I also advised the patient to eat a healthy diet with plenty of whole grains, cereals, fruits and vegetables, exercise regularly and maintain ideal body weightGreater than 50% of time during this 25 minute visit was spent on counseling,explanation of diagnosis, planning of further management, discussion with patient and family and coordination of care  .Followup in the future with me in  6 months or call earlier if necessary  Antony Contras, MD  Note: This document was prepared with digital dictation and possible smart phrase technology. Any transcriptional errors that result from this process are unintentional

## 2015-05-26 NOTE — Patient Instructions (Signed)
I had a long d/w patient about his recent stroke, risk for recurrent stroke/TIAs, personally independently reviewed imaging studies and stroke evaluation results and answered questions.Continue warfarin  for secondary stroke prevention given mechanical heart valve and maintain strict control of hypertension with blood pressure goal below 130/90, diabetes with hemoglobin A1c goal below 6.5% and lipids with LDL cholesterol goal below 100 mg/dL. I also advised the patient to eat a healthy diet with plenty of whole grains, cereals, fruits and vegetables, exercise regularly and maintain ideal body weight Followup in the future with me in  6 months or call earlier if necessary Stroke Prevention Some medical conditions and behaviors are associated with an increased chance of having a stroke. You may prevent a stroke by making healthy choices and managing medical conditions. HOW CAN I REDUCE MY RISK OF HAVING A STROKE?   Stay physically active. Get at least 30 minutes of activity on most or all days.  Do not smoke. It may also be helpful to avoid exposure to secondhand smoke.  Limit alcohol use. Moderate alcohol use is considered to be:  No more than 2 drinks per day for men.  No more than 1 drink per day for nonpregnant women.  Eat healthy foods. This involves:  Eating 5 or more servings of fruits and vegetables a day.  Making dietary changes that address high blood pressure (hypertension), high cholesterol, diabetes, or obesity.  Manage your cholesterol levels.  Making food choices that are high in fiber and low in saturated fat, trans fat, and cholesterol may control cholesterol levels.  Take any prescribed medicines to control cholesterol as directed by your health care provider.  Manage your diabetes.  Controlling your carbohydrate and sugar intake is recommended to manage diabetes.  Take any prescribed medicines to control diabetes as directed by your health care provider.  Control  your hypertension.  Making food choices that are low in salt (sodium), saturated fat, trans fat, and cholesterol is recommended to manage hypertension.  Ask your health care provider if you need treatment to lower your blood pressure. Take any prescribed medicines to control hypertension as directed by your health care provider.  If you are 45-61 years of age, have your blood pressure checked every 3-5 years. If you are 20 years of age or older, have your blood pressure checked every year.  Maintain a healthy weight.  Reducing calorie intake and making food choices that are low in sodium, saturated fat, trans fat, and cholesterol are recommended to manage weight.  Stop drug abuse.  Avoid taking birth control pills.  Talk to your health care provider about the risks of taking birth control pills if you are over 23 years old, smoke, get migraines, or have ever had a blood clot.  Get evaluated for sleep disorders (sleep apnea).  Talk to your health care provider about getting a sleep evaluation if you snore a lot or have excessive sleepiness.  Take medicines only as directed by your health care provider.  For some people, aspirin or blood thinners (anticoagulants) are helpful in reducing the risk of forming abnormal blood clots that can lead to stroke. If you have the irregular heart rhythm of atrial fibrillation, you should be on a blood thinner unless there is a good reason you cannot take them.  Understand all your medicine instructions.  Make sure that other conditions (such as anemia or atherosclerosis) are addressed. SEEK IMMEDIATE MEDICAL CARE IF:   You have sudden weakness or numbness of the face,  arm, or leg, especially on one side of the body.  Your face or eyelid droops to one side.  You have sudden confusion.  You have trouble speaking (aphasia) or understanding.  You have sudden trouble seeing in one or both eyes.  You have sudden trouble walking.  You have  dizziness.  You have a loss of balance or coordination.  You have a sudden, severe headache with no known cause.  You have new chest pain or an irregular heartbeat. Any of these symptoms may represent a serious problem that is an emergency. Do not wait to see if the symptoms will go away. Get medical help at once. Call your local emergency services (911 in [place] Do not drive yourself to the hospital.   This information is not intended to replace advice given to you by your health care provider. Make sure you discuss any questions you have with your health care provider.   Document Released: 09/07/2004 Document Revised: 08/21/2014 Document Reviewed: 01/31/2013 Elsevier Interactive Patient Education [employer].

## 2015-11-25 ENCOUNTER — Ambulatory Visit: Payer: Medicaid Other | Admitting: Neurology

## 2015-11-29 ENCOUNTER — Encounter: Payer: Self-pay | Admitting: Neurology

## 2016-02-29 ENCOUNTER — Ambulatory Visit (INDEPENDENT_AMBULATORY_CARE_PROVIDER_SITE_OTHER): Payer: Medicaid Other | Admitting: Neurology

## 2016-02-29 ENCOUNTER — Encounter: Payer: Self-pay | Admitting: Neurology

## 2016-02-29 VITALS — BP 111/80 | HR 86 | Ht 72.0 in | Wt 185.6 lb

## 2016-02-29 DIAGNOSIS — I6990 Unspecified sequelae of unspecified cerebrovascular disease: Secondary | ICD-10-CM | POA: Diagnosis not present

## 2016-02-29 NOTE — Progress Notes (Signed)
[facility] [address] [place]. [place] [address] [phone]       OFFICE FOLLOW-UP NOTE  Jeff. Jeff [name] Date of Birth:  January 26, 1956 Medical Record Number:  [id number]   HPI: Jeff [name] is a 73 year African-American male seen today for follow-up for first office visit following hospital admission for stroke in August 2016.Jeff [name] is a 60 y.o. male who reported that he was walking to the ATM on 03/19/15 morning and lost control of his cane that he usually holds in his right hand. When he attempted to pick it up he was unable to because he couldn't control his right hand. He went to the store and was unable to open his wallet again because of his right hand. When he attempted to speak his speech was slurred. The patient presented at that time. Symptoms have reoslved. Patient on Coumadin due to aortic and mitral valve replacement but INR is normal today.  Date last known well: Date: 03/19/2015 Time last known well: Time: 07:00 tPA Given: No: Resolution of symptoms. CT scan of the head showed no acute infarct but showed old bilateral cerebral and cerebellar hemispheric infarcts. MRI scan showed an acute small left middle cerebellar peduncle infarct and multiple old bilateral strokes. Transthoracic echo showed ejection fraction of 45-40% with a mechanical heart valve. Carotid Doppler showed no significant extracranial stenosis. LDL cholesterol was elevated at 115 mg percent. Hemoglobin A1c was 5.4. Patient's anti-coagulation was suboptimal and he was started on warfarin and has done well since discharge. He states his gait and balance remained poor but this is pre-existing. His right hand weakness has improved. His blood pressure is well controlled and today it is 110/70. He states his INR do fluctuate a little bit but is tolerating warfarin well without bleeding or bruising. He has his blood checked every week at [facility] clinic. Is tolerating Pravachol well without side  effects. His tongue to quit smoking and has cut down to 1-2 cigarettes per day and is on nicotine patch. Update 02/29/2016 : He returns for follow-up after last visit in October 2016. Continues to do well from neurovascular standpoint without recurrent stroke or TIA symptoms. He remains on warfarin which is tolerating well without bleeding or bruising and states his INR has been fairly steady. He however has fallen twice on both occasions he was trying to get up from a chair and sit in the couch and he slipped. Fortunately did not have any significant bleeding bruising or injury. He states his blood pressure is well controlled and today it is 111/80 in office. He remains on Pravachol which is tolerating well without muscle aches or pains. He plans to have lipid profile and lab work soon with primary physician. He continues to smoke 1-2 cigarettes per day and is not willing to quit. Patient is to have a cane coming in daily to help him clean and cook and would like for that to be continued. I advised him to discuss this with his primary physician. He still has  mild stiffness and incoordination in his left leg and he states he does use his cane most of the time. ROS:   14 system review of systems is positive for  mild gait difficulties, falls and balance problems and all other systems negative.  PMH:  Past Medical History  Diagnosis Date  . History of CVA (cerebrovascular accident)     02/2011  . Hx of aortic valve replacement, mechanical   . H/O mitral valve replacement  with mechanical valve   . HX: long term anticoagulant use   . High cholesterol   . Heart disease   . Stroke [facility])     Social History:  Social History   Social History  . Marital Status: Single    Spouse Name: N/A  . Number of Children: N/A  . Years of Education: N/A   Occupational History  . Not on file.   Social History Main Topics  . Smoking status: Current Every Day Smoker -- 0.05 packs/day    Types: Cigarettes  .  Smokeless tobacco: Not on file     Comment: smoke cigarettes one to two a day  . Alcohol Use: 0.6 oz/week    1 Cans of beer per week     Comment: drinks weekly  . Drug Use: 7.00 per week    Special: Marijuana     Comment: smoke everyday  . Sexual Activity: Yes   Other Topics Concern  . Not on file   Social History Narrative    Medications:   Current Outpatient Prescriptions on File Prior to Visit  Medication Sig Dispense Refill  . amLODipine (NORVASC) 5 MG tablet Take 5 mg by mouth daily.  1  . hydrochlorothiazide (HYDRODIURIL) 25 MG tablet Take 25 mg by mouth daily.  1  . lisinopril (PRINIVIL,ZESTRIL) 20 MG tablet Take 20 mg by mouth daily.  0  . pravastatin (PRAVACHOL) 20 MG tablet Take 20 mg by mouth daily.  0  . warfarin (COUMADIN) 10 MG tablet Take 1 tablet (10 mg total) by mouth daily at 6 PM. (Patient taking differently: Take 5-10 mg by mouth daily at 6 PM. Take 5 mg only on Wednesday Take 10 mg mon tue wed thu and 7.5 on sat and sunday) 30 tablet 1   No current facility-administered medications on file prior to visit.    Allergies:  No Known Allergies  Physical Exam General: well developed, well nourished, seated, in no evident distress Head: head normocephalic and atraumatic.  Neck: supple with no carotid or supraclavicular bruits Cardiovascular: regular rate and rhythm, no murmurs Musculoskeletal: no deformity Skin:  no rash/petichiae Vascular:  Normal pulses all extremities Filed Vitals:   02/29/16 0941  BP: 111/80  Pulse: 86   Neurologic Exam Mental Status: Awake and fully alert. Oriented to place and time. Recent and remote memory intact. Attention span, concentration and fund of knowledge appropriate. Mood and affect appropriate.  Cranial Nerves: Fundoscopic exam  Not done.  . Pupils equal, briskly reactive to light. Extraocular movements full without nystagmus mild saccadic dysmetria on right gaze Visual fields full to confrontation. Hearing intact. Facial  sensation intact. Face, tongue, palate moves normally and symmetrically.  Motor: Normal bulk and tone. Normal strength in all tested extremity muscles. Diminished fine finger movements on the right. Orbits left over right approximately. Sensory.: intact to touch ,pinprick .position and vibratory sensation.  Coordination: Rapid alternating movements normal in all extremities. Finger-to-nose and heel-to-shin performed with mild difficulty on left Gait and Station: Arises from chair without difficulty. Stance is normal. Gait demonstrates normal stride length and mild imbalance uses a cane.. Not able to heel, toe and tandem walk without difficulty.  Reflexes: 1+ and symmetric. Toes downgoing.   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 Multiple vascular risk factors of mechanical heart valve, hypertension, hyperlipidemia and smoking.    PLAN:  I had a long d/w patient about his recent stroke, mechanical heart valve,, risk for recurrent stroke/TIAs, personally independently reviewed imaging studies and stroke evaluation results and answered questions.Continue warfarin daily  for secondary stroke prevention and maintain strict control of hypertension with blood pressure goal below 130/90, diabetes with hemoglobin A1c goal below 6.5% and lipids with LDL cholesterol goal below 70 mg/dL. I also advised the patient to eat a healthy diet with plenty of whole grains, cereals, fruits and vegetables, exercise regularly and maintain ideal body weight. I counseled him to quit smoking completely. We also talked about fall and safety precautions and I advised him to use his cane at all times while walking Followup in the future with stroke nurse practitioner in one year or call earlier if necessary.Greater than 50% of time during this 25 minute  visit was spent on counseling,explanation of diagnosis, planning of further management,   and coordination of care  .Followup in the future with me in  1 year with stroke NP or call earlier if necessary  Antony Contras, MD  Note: This document was prepared with digital dictation and possible smart phrase technology. Any transcriptional errors that result from this process are unintentional

## 2016-02-29 NOTE — Patient Instructions (Signed)
I had a long d/w patient about his recent stroke, mechanical heart valve,, risk for recurrent stroke/TIAs, personally independently reviewed imaging studies and stroke evaluation results and answered questions.Continue warfarin daily  for secondary stroke prevention and maintain strict control of hypertension with blood pressure goal below 130/90, diabetes with hemoglobin A1c goal below 6.5% and lipids with LDL cholesterol goal below 70 mg/dL. I also advised the patient to eat a healthy diet with plenty of whole grains, cereals, fruits and vegetables, exercise regularly and maintain ideal body weight. I counseled him to quit smoking completely. We also talked about fall and safety precautions and I advised him to use his cane at all times while walking Followup in the future with stroke nurse practitioner in one year or call earlier if necessary

## 2016-11-22 ENCOUNTER — Emergency Department (HOSPITAL_COMMUNITY)
Admission: EM | Admit: 2016-11-22 | Discharge: 2016-11-22 | Disposition: A | Discharge status: Home / Self Care | Payer: Medicaid Other | Attending: Physician Assistant | Admitting: Physician Assistant

## 2016-11-22 ENCOUNTER — Encounter (HOSPITAL_COMMUNITY): Payer: Self-pay | Admitting: Emergency Medicine

## 2016-11-22 DIAGNOSIS — F17210 Nicotine dependence, cigarettes, uncomplicated: Secondary | ICD-10-CM | POA: Insufficient documentation

## 2016-11-22 DIAGNOSIS — Z7901 Long term (current) use of anticoagulants: Secondary | ICD-10-CM | POA: Diagnosis not present

## 2016-11-22 DIAGNOSIS — R791 Abnormal coagulation profile: Secondary | ICD-10-CM

## 2016-11-22 DIAGNOSIS — Z8673 Personal history of transient ischemic attack (TIA), and cerebral infarction without residual deficits: Secondary | ICD-10-CM | POA: Insufficient documentation

## 2016-11-22 DIAGNOSIS — R799 Abnormal finding of blood chemistry, unspecified: Secondary | ICD-10-CM | POA: Diagnosis present

## 2016-11-22 DIAGNOSIS — Z79899 Other long term (current) drug therapy: Secondary | ICD-10-CM | POA: Diagnosis not present

## 2016-11-22 LAB — CBC WITH DIFFERENTIAL/PLATELET
Basophils Absolute: 0 10*3/uL (ref 0.0–0.1)
Basophils Relative: 0 %
EOS ABS: 0.1 10*3/uL (ref 0.0–0.7)
Eosinophils Relative: 2 %
HEMATOCRIT: 36.6 % — AB (ref 39.0–52.0)
HEMOGLOBIN: 12.4 g/dL — AB (ref 13.0–17.0)
Lymphocytes Relative: 27 %
Lymphs Abs: 1.6 10*3/uL (ref 0.7–4.0)
MCH: 31 pg (ref 26.0–34.0)
MCHC: 33.9 g/dL (ref 30.0–36.0)
MCV: 91.5 fL (ref 78.0–100.0)
Monocytes Absolute: 0.5 10*3/uL (ref 0.1–1.0)
Monocytes Relative: 9 %
NEUTROS PCT: 62 %
Neutro Abs: 3.7 10*3/uL (ref 1.7–7.7)
Platelets: 321 10*3/uL (ref 150–400)
RBC: 4 MIL/uL — AB (ref 4.22–5.81)
RDW: 13.4 % (ref 11.5–15.5)
WBC: 6 10*3/uL (ref 4.0–10.5)

## 2016-11-22 LAB — POC OCCULT BLOOD, ED: Fecal Occult Bld: NEGATIVE

## 2016-11-22 LAB — COMPREHENSIVE METABOLIC PANEL
ALT: 17 U/L (ref 17–63)
ANION GAP: 8 (ref 5–15)
AST: 25 U/L (ref 15–41)
Albumin: 4.2 g/dL (ref 3.5–5.0)
Alkaline Phosphatase: 100 U/L (ref 38–126)
BUN: 16 mg/dL (ref 6–20)
CO2: 28 mmol/L (ref 22–32)
Calcium: 9.2 mg/dL (ref 8.9–10.3)
Chloride: 99 mmol/L — ABNORMAL LOW (ref 101–111)
Creatinine, Ser: 1.3 mg/dL — ABNORMAL HIGH (ref 0.61–1.24)
GFR, EST NON AFRICAN AMERICAN: 57 mL/min — AB (ref >60)
Glucose, Bld: 104 mg/dL — ABNORMAL HIGH (ref 65–99)
POTASSIUM: 3.3 mmol/L — AB (ref 3.5–5.1)
Sodium: 135 mmol/L (ref 135–145)
Total Bilirubin: 0.6 mg/dL (ref 0.3–1.2)
Total Protein: 7.1 g/dL (ref 6.5–8.1)

## 2016-11-22 LAB — PROTIME-INR
INR: 5.67 — AB
PROTHROMBIN TIME: 52.9 s — AB (ref 11.4–15.2)

## 2016-11-22 NOTE — ED Notes (Signed)
Got the patient a [place] sandwich

## 2016-11-22 NOTE — ED Provider Notes (Signed)
[facility] DEPT Provider Note   CSN: [id number] Arrival date & time: 11/22/16  1515     History   Chief Complaint Chief Complaint  Patient presents with  . Abnormal Lab    HPI Jeff [name] is a 61 y.o. male who presents from the Coumadin clinic. 4. Supratherapeutic INR. Patient states that he thinks he over took his medication but has had documented episodes of post subtherapeutic and supratherapeutic INRs in the past. The patient states that they told him his INR was 8 today at the clinic. He said he might have noticed some red stool this morning but denies any black or tarry-colored stools, chest pain, shortness of breath, abdominal pain, lightheadedness or feelings of presyncope.  HPI  Past Medical History:  Diagnosis Date  . H/O mitral valve replacement with mechanical valve   . Heart disease   . High cholesterol   . History of CVA (cerebrovascular accident)    02/2011  . Hx of aortic valve replacement, mechanical   . HX: long term anticoagulant use   . Stroke [facility])     Patient Active Problem List   Diagnosis Date Noted  . TIA (transient ischemic attack)   . Slurred speech   . Acute ischemic stroke ([place]) 03/19/2015  . Supratherapeutic INR 09/07/2014  . CVA (cerebral infarction) 07/16/2013  . Cerebellar stroke, acute ([place]) 07/13/2013  . Ataxia 07/12/2013  . Vomiting 07/10/2013  . Nausea with vomiting 07/10/2013  . Subtherapeutic international normalized ratio (INR) 07/10/2013  . AKI (acute kidney injury) ([place]) 07/10/2013  . Volume depletion 07/10/2013  . Nausea & vomiting 07/10/2013  . Cellulitis of left upper arm and forearm 04/04/2012  . History of CVA (cerebrovascular accident)   . Hx of aortic valve replacement, mechanical   . H/O mitral valve replacement with mechanical valve   . HX: long term anticoagulant use     Past Surgical History:  Procedure Laterality Date  . CARDIAC VALVE REPLACEMENT         Home Medications    Prior to Admission  medications   Medication Sig Start Date End Date Taking? Authorizing Provider  amLODipine (NORVASC) 5 MG tablet Take 5 mg by mouth daily. 03/05/15  Yes Historical Provider, MD  hydrochlorothiazide (HYDRODIURIL) 25 MG tablet Take 25 mg by mouth daily. 03/05/15  Yes Historical Provider, MD  lisinopril (PRINIVIL,ZESTRIL) 20 MG tablet Take 20 mg by mouth daily. 03/05/15  Yes Historical Provider, MD  pravastatin (PRAVACHOL) 20 MG tablet Take 20 mg by mouth daily. 05/03/15  Yes Historical Provider, MD  warfarin (COUMADIN) 10 MG tablet Take 1 tablet (10 mg total) by mouth daily at 6 PM. Patient taking differently: Take 10-15 mg by mouth See admin instructions. 10mg  on Fri, Sat, Sun - 15mg  on Mon, Tues, Wed, Thurs 07/29/13  Yes Lavon Paganini Angiulli, PA-C    Family History Family History  Problem Relation Age of Onset  . Diabetes Mother     Died before her 47 birthday    Social History Social History  Substance Use Topics  . Smoking status: Current Every Day Smoker    Packs/day: 0.05    Types: Cigarettes  . Smokeless tobacco: Not on file     Comment: smoke cigarettes one to two a day  . Alcohol use 0.6 oz/week    1 Cans of beer per week     Comment: drinks weekly     Allergies   Patient has no known allergies.   Review of Systems Review of Systems  Ten systems reviewed and are negative for acute change, except as noted in the HPI.    Physical Exam Updated Vital Signs BP 122/82   Pulse 90   Temp 98.6 F (37 C) (Oral)   Resp (!) 21   SpO2 99%   Physical Exam  Physical Exam  Nursing note and vitals reviewed. Constitutional: He appears well-developed and well-nourished. No distress.  HENT:  Head: Normocephalic and atraumatic.  Eyes: Conjunctivae normal are normal. No scleral icterus.  Neck: Normal range of motion. Neck supple.  Cardiovascular: Normal rate, regular rhythm and normal heart sounds.   Pulmonary/Chest: Effort normal and breath sounds normal. No respiratory distress.    Abdominal: Soft. There is no tenderness.  Musculoskeletal: He exhibits no edema.  Neurological: He is alert.  Skin: Skin is warm and dry. He is not diaphoretic.  Psychiatric: His behavior is normal.    ED Treatments / Results  Labs (all labs ordered are listed, but only abnormal results are displayed) Labs Reviewed  COMPREHENSIVE METABOLIC PANEL - Abnormal; Notable for the following:       Result Value   Potassium 3.3 (*)    Chloride 99 (*)    Glucose, Bld 104 (*)    Creatinine, Ser 1.30 (*)    GFR calc non Af Amer 57 (*)    All other components within normal limits  CBC WITH DIFFERENTIAL/PLATELET - Abnormal; Notable for the following:    RBC 4.00 (*)    Hemoglobin 12.4 (*)    HCT 36.6 (*)    All other components within normal limits  PROTIME-INR - Abnormal; Notable for the following:    Prothrombin Time 52.9 (*)    INR 5.67 (*)    All other components within normal limits  POC OCCULT BLOOD, ED    EKG  EKG Interpretation None       Radiology No results found.  Procedures Procedures (including critical care time)  Medications Ordered in ED Medications - No data to display   Initial Impression / Assessment and Plan / ED Course  I have reviewed the triage vital signs and the nursing notes.  Pertinent labs & imaging results that were available during my care of the patient were reviewed by me and considered in my medical decision making (see chart for details).  Clinical Course as of Nov 22 1801  Wed Nov 22, 2016  1747 Patient INR 5.67. No blood seen in stool exam and the patient has a mechanical valve with hgb that is just below baseline. I do not feel he is actively bleeding and the patient has a mechanical heart valve. Given these facts I feel that risks of giving vitamin K outweigh the benefits. I have discussed this with the patient and Dr. Thomasene Lot who agrees. Plan to hold 2 doses and get rechecked. I discussed reasons to seek immediate medical care wit the  patient who understands. Patient appears safe for discharge at this time.  [AH]    Clinical Course User Index [AH] Margarita Mail, PA-C      Final Clinical Impressions(s) / ED Diagnoses   Final diagnoses:  Supratherapeutic INR    New Prescriptions New Prescriptions   No medications on file     Margarita Mail, PA-C 11/22/16 [place], MD 11/22/16 2339

## 2016-11-22 NOTE — ED Triage Notes (Signed)
BIB EMS from coumadin clinic, improperly taking coumadin, INR was 8 today. VSS. A/OX4.

## 2016-11-22 NOTE — Discharge Instructions (Signed)
Skip the next 2 doses of your coumadin. Return to the ER for the reasons we discussed. Avoid accidents or falls that might cause bleeding.

## 2016-11-22 NOTE — ED Notes (Signed)
Placed patient on the monitor patient is resting

## 2017-02-28 ENCOUNTER — Encounter: Payer: Self-pay | Admitting: Nurse Practitioner

## 2017-02-28 ENCOUNTER — Ambulatory Visit (INDEPENDENT_AMBULATORY_CARE_PROVIDER_SITE_OTHER): Payer: Medicaid Other | Admitting: Nurse Practitioner

## 2017-02-28 ENCOUNTER — Encounter (INDEPENDENT_AMBULATORY_CARE_PROVIDER_SITE_OTHER): Payer: Self-pay

## 2017-02-28 VITALS — BP 106/75 | HR 113 | Wt 172.6 lb

## 2017-02-28 DIAGNOSIS — I639 Cerebral infarction, unspecified: Secondary | ICD-10-CM

## 2017-02-28 DIAGNOSIS — E785 Hyperlipidemia, unspecified: Secondary | ICD-10-CM | POA: Insufficient documentation

## 2017-02-28 DIAGNOSIS — I10 Essential (primary) hypertension: Secondary | ICD-10-CM | POA: Diagnosis not present

## 2017-02-28 DIAGNOSIS — F17200 Nicotine dependence, unspecified, uncomplicated: Secondary | ICD-10-CM

## 2017-02-28 NOTE — Patient Instructions (Addendum)
Stressed the importance of management of risk factors to prevent further stroke Continue Coumadin for secondary stroke prevention Maintain strict control of hypertension with blood pressure goal below 130/90, today's reading 106/75 continue antihypertensive medications Cholesterol with LDL cholesterol less than 70, followed by primary care, continue Pravachol Exercise by walking, use cane at all times Healthy diet  with whole grains,  fresh fruits and vegetables Discharge from stroke clinic   Stroke Prevention Some health problems and behaviors may make it more likely for you to have a stroke. Below are ways to lessen your risk of having a stroke.  Be active for at least 30 minutes on most or all days.  Do not smoke. Try not to be around others who smoke.  Do not drink too much alcohol. ? Do not have more than 2 drinks a day if you are a man. ? Do not have more than 1 drink a day if you are a woman and are not pregnant.  Eat healthy foods, such as fruits and vegetables. If you were put on a specific diet, follow the diet as told.  Keep your cholesterol levels under control through diet and medicines. Look for foods that are low in saturated fat, trans fat, cholesterol, and are high in fiber.  If you have diabetes, follow all diet plans and take your medicine as told.  Ask your doctor if you need treatment to lower your blood pressure. If you have high blood pressure (hypertension), follow all diet plans and take your medicine as told by your doctor.  If you are 59-37 years old, have your blood pressure checked every 3-5 years. If you are age 64 or older, have your blood pressure checked every year.  Keep a healthy weight. Eat foods that are low in calories, salt, saturated fat, trans fat, and cholesterol.  Do not take drugs.  Avoid birth control pills, if this applies. Talk to your doctor about the risks of taking birth control pills.  Talk to your doctor if you have sleep  problems (sleep apnea).  Take all medicine as told by your doctor. ? You may be told to take aspirin or blood thinner medicine. Take this medicine as told by your doctor. ? Understand your medicine instructions.  Make sure any other conditions you have are being taken care of.  Get help right away if:  You suddenly lose feeling (you feel numb) or have weakness in your face, arm, or leg.  Your face or eyelid hangs down to one side.  You suddenly feel confused.  You have trouble talking (aphasia) or understanding what people are saying.  You suddenly have trouble seeing in one or both eyes.  You suddenly have trouble walking.  You are dizzy.  You lose your balance or your movements are clumsy (uncoordinated).  You suddenly have a very bad headache and you do not know the cause.  You have new chest pain.  Your heart feels like it is fluttering or skipping a beat (irregular heartbeat). Do not wait to see if the symptoms above go away. Get help right away. Call your local emergency services (911 in [place] Do not drive yourself to the hospital. This information is not intended to replace advice given to you by your health care provider. Make sure you discuss any questions you have with your health care provider. Document Released: 01/30/2012 Document Revised: 01/06/2016 Document Reviewed: 01/31/2013 Elsevier Interactive Patient Education  [employer].

## 2017-02-28 NOTE — Progress Notes (Signed)
[facility]  PATIENT: Jeff [name] DOB: 12/12/1955   REASON FOR VISIT:  Follow up for stroke in 2016 HISTORY FROM:  patient   HISTORY OF PRESENT ILLNESS:Jeff [name] is a 34 year African-American male seen today for follow-up for first office visit following hospital admission for stroke in August 2016.Jeff [name] is a 61 y.o. male who reported that he was walking to the ATM on 03/19/15 morning and lost control of his cane that he usually holds in his right hand. When he attempted to pick it up he was unable to because he couldn't control his right hand. He went to the store and was unable to open his wallet again because of his right hand. When he attempted to speak his speech was slurred. The patient presented at that time. Symptoms have reoslved. Patient on Coumadin due to aortic and mitral valve replacement but INR is normal today.  Date last known well: Date: 03/19/2015 Time last known well: Time: 07:00 tPA Given: No: Resolution of symptoms. CT scan of the head showed no acute infarct but showed old bilateral cerebral and cerebellar hemispheric infarcts. MRI scan showed an acute small left middle cerebellar peduncle infarct and multiple old bilateral strokes. Transthoracic echo showed ejection fraction of 45-40% with a mechanical heart valve. Carotid Doppler showed no significant extracranial stenosis. LDL cholesterol was elevated at 115 mg percent. Hemoglobin A1c was 5.4. Patient's anti-coagulation was suboptimal and he was started on warfarin and has done well since discharge. He states his gait and balance remained poor but this is pre-existing. His right hand weakness has improved. His blood pressure is well controlled and today it is 110/70. He states his INR do fluctuate a little bit but is tolerating warfarin well without bleeding or bruising. He has his blood checked every week at family medicine clinic. Is tolerating Pravachol well without side effects. His tongue to  quit smoking and has cut down to 1-2 cigarettes per day and is on nicotine patch. Update 02/29/2016 PS: He returns for follow-up after last visit in October 2016. Continues to do well from neurovascular standpoint without recurrent stroke or TIA symptoms. He remains on warfarin which is tolerating well without bleeding or bruising and states his INR has been fairly steady. He however has fallen twice on both occasions he was trying to get up from a chair and sit in the couch and he slipped. Fortunately did not have any significant bleeding bruising or injury. He states his blood pressure is well controlled and today it is 111/80 in office. He remains on Pravachol which is tolerating well without muscle aches or pains. He plans to have lipid profile and lab work soon with primary physician. He continues to smoke 1-2 cigarettes per day and is not willing to quit. Patient is to have a cane coming in daily to help him clean and cook and would like for that to be continued. I advised him to discuss this with his primary physician. He still has  mild stiffness and incoordination in his left leg and he states he does use his cane most of the time. UPDATE  07/18/2018[name] , 61 year old male returns for follow-up  History of stroke in October 2016.  He continues to do well from a neurovascular standpoint.He remains on Warfarin without bleeding or bruising. His INR has been stable and is followed by family medicine. He remains on Pravachol without complaints of myalgias. He denies any falls. Ambulates with single-point cane . Blood pressure the  office today at 106/75. He continues to smoke 2 cigarettes a day. Denies alcohol. Smokes marijuana. He returns for reevaluation . REVIEW OF SYSTEMS: Full 14 system review of systems performed and notable only for those listed, all others are neg:  Constitutional: neg  Cardiovascular: neg Ear/Nose/Throat: neg  Skin: neg Eyes: neg Respiratory: neg Gastroitestinal: neg    Hematology/Lymphatic: neg  Endocrine: neg Musculoskeletal:neg Allergy/Immunology: neg Neurological: neg Psychiatric: neg Sleep : neg   ALLERGIES: No Known Allergies  HOME MEDICATIONS: Outpatient Medications Prior to Visit  Medication Sig Dispense Refill  . amLODipine (NORVASC) 5 MG tablet Take 5 mg by mouth daily.  1  . hydrochlorothiazide (HYDRODIURIL) 25 MG tablet Take 25 mg by mouth daily.  1  . lisinopril (PRINIVIL,ZESTRIL) 20 MG tablet Take 20 mg by mouth daily.  0  . pravastatin (PRAVACHOL) 20 MG tablet Take 20 mg by mouth daily.  0  . warfarin (COUMADIN) 10 MG tablet Take 1 tablet (10 mg total) by mouth daily at 6 PM. (Patient taking differently: Take 10-15 mg by mouth See admin instructions. '10mg'$  on Fri, Sat, Sun - '15mg'$  on Mon, Tues, Wed, Thurs) 30 tablet 1   No facility-administered medications prior to visit.     PAST MEDICAL HISTORY: Past Medical History:  Diagnosis Date  . H/O mitral valve replacement with mechanical valve   . Heart disease   . High cholesterol   . History of CVA (cerebrovascular accident)    02/2011  . Hx of aortic valve replacement, mechanical   . HX: long term anticoagulant use   . Stroke [facility])     PAST SURGICAL HISTORY: Past Surgical History:  Procedure Laterality Date  . CARDIAC VALVE REPLACEMENT      FAMILY HISTORY: Family History  Problem Relation Age of Onset  . Diabetes Mother        Died before her 76 birthday  . Heart attack Brother     SOCIAL HISTORY: Social History   Social History  . Marital status: Single    Spouse name: N/A  . Number of children: N/A  . Years of education: N/A   Occupational History  . Not on file.   Social History Main Topics  . Smoking status: Current Every Day Smoker    Packs/day: 0.05    Types: Cigarettes  . Smokeless tobacco: Never Used     Comment: smoke cigarettes one to two a day  . Alcohol use 0.6 oz/week    1 Cans of beer per week     Comment: drinks weekly  . Drug use: Yes     Frequency: 7.0 times per week    Types: Marijuana     Comment: 02/28/17 smoke everyday  . Sexual activity: Yes   Other Topics Concern  . Not on file   Social History Narrative  . No narrative on file     PHYSICAL EXAM  Vitals:   02/28/17 1333  BP: 106/75  Pulse: (!) 113  Weight: 172 lb 9.6 oz (78.3 kg)   Body mass index is 23.41 kg/m.  Generalized: Well developed, in no acute distress  Head: normocephalic and atraumatic,. Oropharynx benign  Neck: Supple, no carotid bruits  Cardiac: Regular rate rhythm, no murmur  Musculoskeletal: No deformity   Neurological examination   Mentation: Alert oriented to time, place, history taking. Attention span and concentration appropriate. Recent and remote memory intact.  Follows all commands speech and language fluent.   Cranial nerve II-XII: Pupils were equal round reactive to light  extraocular movements were full, visual field were full on confrontational test. Facial sensation and strength were normal. hearing was intact to finger rubbing bilaterally. Uvula tongue midline. head turning and shoulder shrug were normal and symmetric.Tongue protrusion into cheek strength was normal. Motor: normal bulk and tone, full strength in the BUE, BLE, fine finger movements normal, no pronator drift. No focal weakness Sensory: normal and symmetric to light touch, on the face arms and legs Coordination: finger-nose-finger, heel-to-shin bilaterally, no dysmetria, no tremor Reflexes: Brachioradialis 2/2, biceps 2/2, triceps 2/2, patellar 2/2, Achilles 2/2, plantar responses were flexor bilaterally. Gait and Station: Rising up from seated position without assistance, normal stance,  moderate stride, good arm swing, smooth turning, able to perform tiptoe, and heel walking without difficulty. Tandem gait is steady  DIAGNOSTIC DATA (LABS, IMAGING, TESTING) - I reviewed patient records, labs, notes, testing and imaging myself where available.  Lab Results   Component Value Date   WBC 6.0 11/22/2016   HGB 12.4 (L) 11/22/2016   HCT 36.6 (L) 11/22/2016   MCV 91.5 11/22/2016   PLT 321 11/22/2016      Component Value Date/Time   NA 135 11/22/2016 1606   K 3.3 (L) 11/22/2016 1606   CL 99 (L) 11/22/2016 1606   CO2 28 11/22/2016 1606   GLUCOSE 104 (H) 11/22/2016 1606   BUN 16 11/22/2016 1606   CREATININE 1.30 (H) 11/22/2016 1606   CALCIUM 9.2 11/22/2016 1606   PROT 7.1 11/22/2016 1606   ALBUMIN 4.2 11/22/2016 1606   AST 25 11/22/2016 1606   ALT 17 11/22/2016 1606   ALKPHOS 100 11/22/2016 1606   BILITOT 0.6 11/22/2016 1606   GFRNONAA 57 (L) 11/22/2016 1606   GFRAA >60 11/22/2016 1606   Lab Results  Component Value Date   CHOL 180 03/20/2015   HDL 49 03/20/2015   LDLCALC 115 (H) 03/20/2015   TRIG 79 03/20/2015   CHOLHDL 3.7 03/20/2015   Lab Results  Component Value Date   HGBA1C 5.4 03/20/2015     ASSESSMENT AND PLAN 40 year African-American male with small left cerebellar infarct in August 2016 secondary to cardioembolic embolism from mechanical heart valve with suboptimal anticoagulation. Multiple vascular risk factors of mechanical heart valve, hypertension, hyperlipidemia and smoking.   PLAN: Stressed the importance of management of risk factors to prevent further stroke Continue Coumadin for secondary stroke prevention Maintain strict control of hypertension with blood pressure goal below 130/90, today's reading 106/75 continue antihypertensive medications Cholesterol with LDL cholesterol less than 70, followed by primary care, continue Pravachol Exercise by walking, use cane at all times Healthy diet  with whole grains,  fresh fruits and vegetables Discharge from stroke clinic I spent 25 minutes in total face to face time with the patient more than 50% of which was spent counseling and coordination of care, reviewing test results reviewing medications and discussing and reviewing the diagnosis of stroke and management of  risk factors. Patient encouraged to discontinue smoking altogether, and not use illicit drugs Dennie Bible, [facility], [facility], APRN  [facility] [address], [place], [place] [address] [phone]

## 2017-03-08 NOTE — Progress Notes (Signed)
I agree with the above plan 

## 2017-07-29 ENCOUNTER — Emergency Department (HOSPITAL_COMMUNITY): Payer: Medicare Other

## 2017-07-29 ENCOUNTER — Encounter (HOSPITAL_COMMUNITY): Payer: Self-pay

## 2017-07-29 ENCOUNTER — Observation Stay (HOSPITAL_COMMUNITY)
Admission: EM | Admit: 2017-07-29 | Discharge: 2017-07-31 | Disposition: A | Discharge status: Home / Self Care | Payer: Medicare Other | Attending: Internal Medicine | Admitting: Internal Medicine

## 2017-07-29 DIAGNOSIS — I63412 Cerebral infarction due to embolism of left middle cerebral artery: Secondary | ICD-10-CM

## 2017-07-29 DIAGNOSIS — R4781 Slurred speech: Secondary | ICD-10-CM | POA: Diagnosis not present

## 2017-07-29 DIAGNOSIS — Z7901 Long term (current) use of anticoagulants: Secondary | ICD-10-CM

## 2017-07-29 DIAGNOSIS — Z952 Presence of prosthetic heart valve: Secondary | ICD-10-CM | POA: Diagnosis not present

## 2017-07-29 DIAGNOSIS — Z9114 Patient's other noncompliance with medication regimen: Secondary | ICD-10-CM | POA: Insufficient documentation

## 2017-07-29 DIAGNOSIS — I6389 Other cerebral infarction: Principal | ICD-10-CM | POA: Insufficient documentation

## 2017-07-29 DIAGNOSIS — E7800 Pure hypercholesterolemia, unspecified: Secondary | ICD-10-CM | POA: Insufficient documentation

## 2017-07-29 DIAGNOSIS — Z8673 Personal history of transient ischemic attack (TIA), and cerebral infarction without residual deficits: Secondary | ICD-10-CM | POA: Diagnosis not present

## 2017-07-29 DIAGNOSIS — I10 Essential (primary) hypertension: Secondary | ICD-10-CM | POA: Diagnosis not present

## 2017-07-29 DIAGNOSIS — I639 Cerebral infarction, unspecified: Secondary | ICD-10-CM | POA: Diagnosis present

## 2017-07-29 DIAGNOSIS — F1210 Cannabis abuse, uncomplicated: Secondary | ICD-10-CM | POA: Diagnosis present

## 2017-07-29 DIAGNOSIS — M47812 Spondylosis without myelopathy or radiculopathy, cervical region: Secondary | ICD-10-CM | POA: Diagnosis not present

## 2017-07-29 DIAGNOSIS — F17210 Nicotine dependence, cigarettes, uncomplicated: Secondary | ICD-10-CM | POA: Diagnosis not present

## 2017-07-29 DIAGNOSIS — Z23 Encounter for immunization: Secondary | ICD-10-CM | POA: Insufficient documentation

## 2017-07-29 DIAGNOSIS — I77819 Aortic ectasia, unspecified site: Secondary | ICD-10-CM | POA: Insufficient documentation

## 2017-07-29 DIAGNOSIS — E876 Hypokalemia: Secondary | ICD-10-CM | POA: Insufficient documentation

## 2017-07-29 DIAGNOSIS — Z79899 Other long term (current) drug therapy: Secondary | ICD-10-CM | POA: Insufficient documentation

## 2017-07-29 DIAGNOSIS — I6523 Occlusion and stenosis of bilateral carotid arteries: Secondary | ICD-10-CM | POA: Insufficient documentation

## 2017-07-29 DIAGNOSIS — Z7982 Long term (current) use of aspirin: Secondary | ICD-10-CM | POA: Insufficient documentation

## 2017-07-29 DIAGNOSIS — E785 Hyperlipidemia, unspecified: Secondary | ICD-10-CM | POA: Diagnosis present

## 2017-07-29 DIAGNOSIS — F17200 Nicotine dependence, unspecified, uncomplicated: Secondary | ICD-10-CM | POA: Diagnosis present

## 2017-07-29 HISTORY — DX: Cerebral infarction, unspecified: I63.9

## 2017-07-29 LAB — COMPREHENSIVE METABOLIC PANEL
ALBUMIN: 4.2 g/dL (ref 3.5–5.0)
ALK PHOS: 102 U/L (ref 38–126)
ALT: 17 U/L (ref 17–63)
AST: 23 U/L (ref 15–41)
Anion gap: 12 (ref 5–15)
BUN: 9 mg/dL (ref 6–20)
CALCIUM: 9.2 mg/dL (ref 8.9–10.3)
CHLORIDE: 101 mmol/L (ref 101–111)
CO2: 20 mmol/L — AB (ref 22–32)
CREATININE: 1.09 mg/dL (ref 0.61–1.24)
GFR calc non Af Amer: >60 mL/min (ref >60)
GLUCOSE: 133 mg/dL — AB (ref 65–99)
Potassium: 3.2 mmol/L — ABNORMAL LOW (ref 3.5–5.1)
SODIUM: 133 mmol/L — AB (ref 135–145)
Total Bilirubin: 0.6 mg/dL (ref 0.3–1.2)
Total Protein: 7.1 g/dL (ref 6.5–8.1)

## 2017-07-29 LAB — CBC
HCT: 36.3 % — ABNORMAL LOW (ref 39.0–52.0)
Hemoglobin: 12.3 g/dL — ABNORMAL LOW (ref 13.0–17.0)
MCH: 31.5 pg (ref 26.0–34.0)
MCHC: 33.9 g/dL (ref 30.0–36.0)
MCV: 92.8 fL (ref 78.0–100.0)
PLATELETS: 255 10*3/uL (ref 150–400)
RBC: 3.91 MIL/uL — AB (ref 4.22–5.81)
RDW: 13.7 % (ref 11.5–15.5)
WBC: 5.7 10*3/uL (ref 4.0–10.5)

## 2017-07-29 LAB — PROTIME-INR
INR: 1.48
PROTHROMBIN TIME: 17.8 s — AB (ref 11.4–15.2)

## 2017-07-29 LAB — DIFFERENTIAL
BASOS ABS: 0 10*3/uL (ref 0.0–0.1)
BASOS PCT: 0 %
Eosinophils Absolute: 0 10*3/uL (ref 0.0–0.7)
Eosinophils Relative: 1 %
LYMPHS PCT: 21 %
Lymphs Abs: 1.2 10*3/uL (ref 0.7–4.0)
Monocytes Absolute: 0.4 10*3/uL (ref 0.1–1.0)
Monocytes Relative: 6 %
NEUTROS ABS: 4.1 10*3/uL (ref 1.7–7.7)
NEUTROS PCT: 72 %

## 2017-07-29 LAB — I-STAT CHEM 8, ED
BUN: 9 mg/dL (ref 6–20)
CALCIUM ION: 1.12 mmol/L — AB (ref 1.15–1.40)
CHLORIDE: 100 mmol/L — AB (ref 101–111)
Creatinine, Ser: 1.1 mg/dL (ref 0.61–1.24)
Glucose, Bld: 138 mg/dL — ABNORMAL HIGH (ref 65–99)
HEMATOCRIT: 39 % (ref 39.0–52.0)
Hemoglobin: 13.3 g/dL (ref 13.0–17.0)
POTASSIUM: 3.2 mmol/L — AB (ref 3.5–5.1)
SODIUM: 138 mmol/L (ref 135–145)
TCO2: 23 mmol/L (ref 22–32)

## 2017-07-29 LAB — APTT: APTT: 37 s — AB (ref 24–36)

## 2017-07-29 LAB — I-STAT TROPONIN, ED: Troponin i, poc: 0 ng/mL (ref 0.00–0.08)

## 2017-07-29 LAB — ETHANOL: ALCOHOL ETHYL (B): 35 mg/dL — AB (ref <10)

## 2017-07-29 MED ORDER — ALTEPLASE (STROKE) FULL DOSE INFUSION: 0.9000 mg/kg | Freq: Once | INTRAVENOUS | Status: DC

## 2017-07-29 MED ORDER — IOPAMIDOL (ISOVUE-370) INJECTION 76%
INTRAVENOUS | Status: AC
  Administered 2017-07-29: 90 mL
  Filled 2017-07-29: qty 100

## 2017-07-29 MED ORDER — SODIUM CHLORIDE 0.9 % IV SOLN: 50.0000 mL | Freq: Once | INTRAVENOUS | Status: DC

## 2017-07-29 NOTE — Consult Note (Addendum)
Neurology Consultation  Reason for Consult: Acute code stroke Referring Physician: Dr. Wyvonnia Dusky  CC: Garbled speech  History is obtained from: EMS, some from patient  HPI: Jeff [name] is a 61 y.o. male who has a history of mitral valve replacement with mitral valve per chart on Coumadin but says this was switched to Xarelto with last dose of Xarelto this evening, aortic valve replacement, multiple strokes in the past, hypertension and hyperlipidemia, coronary artery disease who presented to the emergency room via EMS when they were called by a neighbor who noticed that the patient speech was garbled. According to report, he was walking home from his sister's house, where he said he had had 24 ounces of an alcoholic beverage, and he was normal there.  Upon getting close to home, his speech started becoming garbled.  He asked a neighbor for help and made some sense and asking them to call EMS. EMS arrived on scene, found him to be aphasic, with no focal weakness or cranial nerve deficits. His systolic blood pressures were in the range of 120-140 throughout transportation. Upon arrival in the emergency room here, he was able to follow some commands.  He had profound expressive aphasia with a word salad speech. Upon confirming with him if he is on Coumadin, he said no.  He could not initially say what he was taking for his mechanical heart valves but soon after the CT, he was able to tell us that his anticoagulation had been switched to Xarelto by a clinic on [address].  He could not tell us the name of the doctor.  CT angiogram of the head and neck and CT perfusion studies were performed, with concern for by a new area of ischemia in the left parietal hemisphere. TPA was not used because of last dose of Xarelto a few hours prior to presentation. Not an endovascular candidate because of no large vessel occlusion seen on CT angiogram of the head and neck  LKW: 10:30 PM on 07/30/2017 tpa given?: no,  on Xarelto Premorbid modified Rankin scale (mRS): 2  HLK:TGYBWL to obtain due to altered mental status.   Past Medical History:  Diagnosis Date  . H/O mitral valve replacement with mechanical valve   . Heart disease   . High cholesterol   . History of CVA (cerebrovascular accident)    02/2011  . Hx of aortic valve replacement, mechanical   . HX: long term anticoagulant use   . Stroke [facility])    Family History  Problem Relation Age of Onset  . Diabetes Mother        Died before her 85 birthday  . Heart attack Brother     Social History:   reports that he has been smoking cigarettes.  He has been smoking about 0.05 packs per day. he has never used smokeless tobacco. He reports that he drinks about 0.6 oz of alcohol per week. He reports that he uses drugs. Drug: Marijuana. Frequency: 7.00 times per week.  Medications No current facility-administered medications for this encounter.   Current Outpatient Medications:  .  amLODipine (NORVASC) 5 MG tablet, Take 5 mg by mouth daily., Disp: , Rfl: 1 .  hydrochlorothiazide (HYDRODIURIL) 25 MG tablet, Take 25 mg by mouth daily., Disp: , Rfl: 1 .  lisinopril (PRINIVIL,ZESTRIL) 20 MG tablet, Take 20 mg by mouth daily., Disp: , Rfl: 0 .  pravastatin (PRAVACHOL) 20 MG tablet, Take 20 mg by mouth daily., Disp: , Rfl: 0 .  warfarin (COUMADIN) 10  MG tablet, Take 1 tablet (10 mg total) by mouth daily at 6 PM. (Patient taking differently: Take 10-15 mg by mouth See admin instructions. 10mg  on Fri, Sat, Sun - 15mg  on Mon, Tues, Wed, Thurs), Disp: 30 tablet, Rfl: 1   Exam: Current vital signs: Ht 6' (1.829 m)   Wt 77 kg (169 lb 12.1 oz)   BMI 23.02 kg/m  Vital signs in last 24 hours: Weight:  [77 kg (169 lb 12.1 oz)] 77 kg (169 lb 12.1 oz) (12/16 2300)  GENERAL: Awake, alert in NAD HEENT: - Normocephalic and atraumatic, dry mm, no LN++, no Thyromegally LUNGS - Clear to auscultation bilaterally with no wheezes CV - S1S2 RRR, no m/r/g, equal  pulses bilaterally. ABDOMEN - Soft, nontender, nondistended with normoactive BS Ext: warm, well perfused, intact peripheral pulses, no edema  NEURO:  Mental Status: AA&Ox 2.  Reported his age to be 64 when he is 21.  Was able to say he is in the hospital after multiple tries. Language: speech is mildly dysarthric.  Speech is fluent but naming and repetition are markedly impaired.  Comprehension is grossly intact.  Cranial Nerves: PERR. EOMI, visual fields full, no facial asymmetry, facial sensation intact, hearing intact, tongue/uvula/soft palate midline, normal sternocleidomastoid and trapezius muscle strength. No evidence of tongue atrophy or fibrillations Motor: 5/5 in all fours with no vertical drift Tone: is normal and bulk is normal Sensation- Intact to light touch bilaterally Coordination: FTN intact bilaterally Gait- deferred  NIHSS 1a Level of Conscious.: 0 1b LOC Questions: 1 1c LOC Commands: 0 2 Best Gaze: 0 3 Visual: 0 4 Facial Palsy: 0 5a Motor Arm - left: 0 5b Motor Arm - Right: 0 6a Motor Leg - Left: 0 6b Motor Leg - Right: 0 7 Limb Ataxia: 0 8 Sensory: 0 9 Best Language: 2 10 Dysarthria: 1 11 Extinct. and Inatten.: 0 TOTAL: 3  Labs I have reviewed labs in epic and the results pertinent to this consultation are:  CBC    Component Value Date/Time   WBC 5.7 07/29/2017 2305   RBC 3.91 (L) 07/29/2017 2305   HGB 13.3 07/29/2017 2319   HCT 39.0 07/29/2017 2319   PLT 255 07/29/2017 2305   MCV 92.8 07/29/2017 2305   MCH 31.5 07/29/2017 2305   MCHC 33.9 07/29/2017 2305   RDW 13.7 07/29/2017 2305   LYMPHSABS 1.2 07/29/2017 2305   MONOABS 0.4 07/29/2017 2305   EOSABS 0.0 07/29/2017 2305   BASOSABS 0.0 07/29/2017 2305    CMP     Component Value Date/Time   NA 138 07/29/2017 2319   K 3.2 (L) 07/29/2017 2319   CL 100 (L) 07/29/2017 2319   CO2 20 (L) 07/29/2017 2305   GLUCOSE 138 (H) 07/29/2017 2319   BUN 9 07/29/2017 2319   CREATININE 1.10 07/29/2017  2319   CALCIUM 9.2 07/29/2017 2305   PROT 7.1 07/29/2017 2305   ALBUMIN 4.2 07/29/2017 2305   AST 23 07/29/2017 2305   ALT 17 07/29/2017 2305   ALKPHOS 102 07/29/2017 2305   BILITOT 0.6 07/29/2017 2305   GFRNONAA >60 07/29/2017 2305   GFRAA >60 07/29/2017 2305    Lipid Panel     Component Value Date/Time   CHOL 180 03/20/2015 0422   TRIG 79 03/20/2015 0422   HDL 49 03/20/2015 0422   CHOLHDL 3.7 03/20/2015 0422   VLDL 16 03/20/2015 0422   LDLCALC 115 (H) 03/20/2015 0422     Imaging I have reviewed the images obtained:  CT-scan of the brain -bifrontal areas of chronic encephalomalacia and bilateral cerebellar encephalomalacia.  Possible dense left MCA.  CT angiogram of the head and neck shows no large vessel occlusion.  Scattered calcification and atherosclerosis throughout the head and neck vasculature. CT perfusion study shows a left parietal area of penumbra with no core, which might reflect a new area of ischemia. There is also a small area of core in the left frontal area -possibly reflecting old infarct  Assessment:  61 year old man with a past medical history of mechanical mitral valve and mechanical aortic valve who was supposed to be on Coumadin but is currently on Xarelto according to him, multiple strokes in the past, hypertension hyperlipidemia coronary artery disease presented to the EMS for evaluation of acute onset of expressive aphasia. On my examination, he had mild dysarthria and expressive aphasia. Noncontrast CT of the head does not show any acute changes, might have a possible dense left MCA. CT angiogram of the head and neck did not show any large vessel occlusion that might have been amenable to endovascular intervention. CT perfusion study showed a small area of penumbra in the left parietal area with no core which might reflect a new area of ischemia and also a left frontal area of core only possibly reflecting the old infarcts from prior stroke.  His  symptoms of speech (expressive aphasia) are disabling but since he is on Xarelto and took his last dose this evening, TPA is contraindicated. No large vessel occlusion for endovascular procedure.  Impression: Acute ischemic stroke- likely embolic from the heart because of mechanical valves and possibly suboptimal anticoagulation.  He confirmed multiple times that he is on Xarelto, but Xarelto is not indicated for mechanical valves.  Recommendations: -Admit to hospitalist -would recommend stepdown admission. -Telemetry monitoring -Keep head end of bed down, give NS bolus 1L -Allow for permissive hypertension for the first 24-48h. -MRI brain without contrast  -Echocardiogram -HgbA1c, fasting lipid panel -Frequent neuro checks -Prophylactic therapy-currently on Xarelto.  For now hold Xarelto.  Decision on continuation of anticoagulation and appropriate agent after MRI and based on clinical couse. -Atorvastatin 80 mg PO daily -Risk factor modification -PT consult, OT consult, Speech consult.  Please page stroke NP/PA/MD (listed on AMION)  from 8am-4 pm as this patient will be followed by the stroke team at this point.  -- Amie Portland, MD [facility] [phone] If 7pm to 7am, please call on call as listed on AMION.  CRITICAL CARE Performed by: Amie Portland Total critical care time: 45 minutes Critical care time was exclusive of separately billable procedures and treating other patients. Critical care was necessary to treat or prevent imminent or life-threatening deterioration. Critical care was time spent personally by me on the following activities: development of treatment plan with patient and/or surrogate as well as nursing, discussions with consultants, evaluation of patient's response to treatment, examination of patient, obtaining history from patient or surrogate, ordering and performing treatments and interventions, ordering and review of laboratory studies, ordering  and review of radiographic studies, pulse oximetry and re-evaluation of patient's condition.

## 2017-07-29 NOTE — ED Provider Notes (Signed)
[name] Provider Note   CSN: [id number] Arrival date & time: 07/29/17  2312     History   Chief Complaint No chief complaint on file.   HPI [name] is a 61 y.o. male.  Level 5 caveat for acuity of condition.  Patient arrives via EMS as code stroke.  Sudden onset of slurred speech with difficulty talking.  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  He denies any headache or chest pain.  No focal motor or sensory deficits.  Patient apparently did have some difficulty speaking with previous strokes but this is worse.  Patient has since developed some tingling in his right hand since arrival in the ED.  He states that he was taken off of Coumadin several weeks ago by his doctor and placed on Xarelto instead.  Last dose was this morning.   The history is provided by the patient and the EMS personnel. The history is limited by the condition of the patient.    Past Medical History:  Diagnosis Date  . H/O mitral valve replacement with mechanical valve   . Heart disease   . High cholesterol   . History of CVA (cerebrovascular accident)    02/2011  . Hx of aortic valve replacement, mechanical   . HX: long term anticoagulant use   . Stroke [facility])     Patient Active Problem List   Diagnosis Date Noted  . Hyperlipidemia 02/28/2017  . Hypertension 02/28/2017  . Smoking 02/28/2017  . TIA (transient ischemic attack)   . Slurred speech   . Acute ischemic stroke ([place]) 03/19/2015  . Supratherapeutic INR 09/07/2014  . CVA (cerebral infarction) 07/16/2013  . Cerebellar stroke, acute ([place]) 07/13/2013  . Ataxia 07/12/2013  . Vomiting 07/10/2013  . Nausea with vomiting 07/10/2013  . Subtherapeutic international normalized ratio (INR) 07/10/2013  . AKI (acute kidney injury) ([place]) 07/10/2013  . Volume depletion 07/10/2013  . Nausea & vomiting 07/10/2013  . Cellulitis of  left upper arm and forearm 04/04/2012  . History of CVA (cerebrovascular accident)   . Hx of aortic valve replacement, mechanical   . H/O mitral valve replacement with mechanical valve   . HX: long term anticoagulant use     Past Surgical History:  Procedure Laterality Date  . CARDIAC VALVE REPLACEMENT         Home Medications    Prior to Admission medications   Medication Sig Start Date End Date Taking? Authorizing Provider  amLODipine (NORVASC) 5 MG tablet Take 5 mg by mouth daily. 03/05/15   [provider]  hydrochlorothiazide (HYDRODIURIL) 25 MG tablet Take 25 mg by mouth daily. 03/05/15   [provider]  lisinopril (PRINIVIL,ZESTRIL) 20 MG tablet Take 20 mg by mouth daily. 03/05/15   [provider]  pravastatin (PRAVACHOL) 20 MG tablet Take 20 mg by mouth daily. 05/03/15   [provider]  warfarin (COUMADIN) 10 MG tablet Take 1 tablet (10 mg total) by mouth daily at 6 PM. Patient taking differently: Take 10-15 mg by mouth See admin instructions. 10mg  on Fri, Sat, Sun - 15mg  on Mon, Tues, Wed, Thurs 07/29/13   Cathlyn Parsons, PA-C    Family History Family History  Problem Relation Age of Onset  . Diabetes Mother        Died before her 69 birthday  . Heart attack Brother     Social History Social History  Tobacco Use  . Smoking status: Current Every Day Smoker    Packs/day: 0.05    Types: Cigarettes  . Smokeless tobacco: Never Used  . Tobacco comment: smoke cigarettes one to two a day  Substance Use Topics  . Alcohol use: Yes    Alcohol/week: 0.6 oz    Types: 1 Cans of beer per week    Comment: drinks weekly  . Drug use: Yes    Frequency: 7.0 times per week    Types: Marijuana    Comment: 02/28/17 smoke everyday     Allergies   Patient has no known allergies.   Review of Systems Review of Systems  Unable to perform ROS: Acuity of condition     Physical Exam Updated Vital Signs Ht 6' (1.829 m)   Wt 77 kg  (169 lb 12.1 oz)   BMI 23.02 kg/m   Physical Exam  Constitutional: He is oriented to person, place, and time. He appears well-developed and well-nourished. No distress.  HENT:  Head: Normocephalic and atraumatic.  Mouth/Throat: Oropharynx is clear and moist. No oropharyngeal exudate.  Eyes: Conjunctivae and EOM are normal. Pupils are equal, round, and reactive to light.  Neck: Normal range of motion. Neck supple.  No meningismus.  Cardiovascular: Normal rate, regular rhythm, normal heart sounds and intact distal pulses.  No murmur heard. Mechanical click  Pulmonary/Chest: Effort normal and breath sounds normal. No respiratory distress.  Abdominal: Soft. There is no tenderness. There is no rebound and no guarding.  Musculoskeletal: Normal range of motion. He exhibits no edema or tenderness.  Neurological: He is alert and oriented to person, place, and time. No cranial nerve deficit. He exhibits normal muscle tone. Coordination normal.  Expressive and receptive aphasia.  No facial droop.  Tongue is midline.  5/5 strength throughout.  No pronator drift.  Patient is alert and oriented x3.  Denies pain.  Skin: Skin is warm.  Psychiatric: He has a normal mood and affect. His behavior is normal.  Nursing note and vitals reviewed.    ED Treatments / Results  Labs (all labs ordered are listed, but only abnormal results are displayed) Labs Reviewed  PROTIME-INR - Abnormal; Notable for the following components:      Result Value   Prothrombin Time 17.8 (*)    All other components within normal limits  APTT - Abnormal; Notable for the following components:   aPTT 37 (*)    All other components within normal limits  CBC - Abnormal; Notable for the following components:   RBC 3.91 (*)    Hemoglobin 12.3 (*)    HCT 36.3 (*)    All other components within normal limits  COMPREHENSIVE METABOLIC PANEL - Abnormal; Notable for the following components:   Sodium 133 (*)    Potassium 3.2 (*)      CO2 20 (*)    Glucose, Bld 133 (*)    All other components within normal limits  I-STAT CHEM 8, ED - Abnormal; Notable for the following components:   Potassium 3.2 (*)    Chloride 100 (*)    Glucose, Bld 138 (*)    Calcium, Ion 1.12 (*)    All other components within normal limits  DIFFERENTIAL  ETHANOL  RAPID URINE DRUG SCREEN, HOSP PERFORMED  URINALYSIS, ROUTINE W REFLEX MICROSCOPIC  I-STAT TROPONIN, ED    EKG  EKG Interpretation None       Radiology Ct Head Code Stroke Wo Contrast  Result Date: 07/29/2017 CLINICAL DATA:  Code  stroke. Initial evaluation for acute speech difficulty. EXAM: CT HEAD WITHOUT CONTRAST TECHNIQUE: Contiguous axial images were obtained from the base of the skull through the vertex without intravenous contrast. COMPARISON:  Prior CT from 03/19/2015. FINDINGS: Brain: Atrophy with mild chronic small vessel ischemic disease. Multiple remote cortical infarcts seen involving the bilateral frontal lobes, right parietal lobe, as well as the bilateral cerebral hemispheres. No evidence for acute large vessel territory infarct. No acute intracranial hemorrhage. No mass lesion or midline shift. No hydrocephalus. No extra-axial fluid collection. Vascular: No asymmetric hyperdense vessel. Scattered vascular calcifications noted within the carotid siphons. Skull: Scalp soft tissues and calvarium within normal limits. Left temple scalp lipoma noted. Calvarium intact. Sinuses/Orbits: Globes and oval soft tissues within normal limits. Paranasal sinuses are clear. No mastoid effusion. Other: None. ASPECTS [facility] Stroke Program Early CT Score) - Ganglionic level infarction (caudate, lentiform nuclei, internal capsule, insula, M1-M3 cortex): 7 - Supraganglionic infarction (M4-M6 cortex): 3 Total score (0-10 with 10 being normal): 10 IMPRESSION: 1. No acute intracranial infarct or other abnormality identified. 2. ASPECTS is 10. 3. Multiple remote bilateral cerebral and  cerebellar infarcts, stable from previous. Critical Value/emergent results were called by telephone at the time of interpretation on 07/29/2017 at 11:33 pm to Dr. Rory Percy , who verbally acknowledged these results. Electronically Signed   By: Jeannine Boga M.D.   On: 07/29/2017 23:37    Procedures Procedures (including critical care time)  Medications Ordered in ED Medications  iopamidol (ISOVUE-370) 76 % injection (90 mLs  Contrast Given 07/29/17 2330)     Initial Impression / Assessment and Plan / ED Course  I have reviewed the triage vital signs and the nursing notes.  Pertinent labs & imaging results that were available during my care of the patient were reviewed by me and considered in my medical decision making (see chart for details).    Patient presents as code stroke with acute onset of aphasia and dysarthria.  Last seen normal 30 minutes prior to arrival.  Seen emergently with Dr. Rory Percy of neurology.   CT head is negative. INR is 1.48.  Patient is clear that he is no longer on Coumadin but is on Xarelto.  He is not a candidate for TPA in this case.  Unclear indication of Xarelto in setting of mechanical valves.   D/w Dr. Rory Percy.  Patient not TPA or IR candidate given distal location of new ischemia.  NIH score is 2.  Dr. Malen Gauze does not recommend IV heparin at this point for mechanical valves until completion of MRI.  Patient speech deficits are improving.  He denies any pain.  Labs are reassuring.  Hemoglobin is stable. Admission discussed with Dr. Alcario Drought.  CRITICAL CARE Performed by: Ezequiel Essex Total critical care time: 35 minutes Critical care time was exclusive of separately billable procedures and treating other patients. Critical care was necessary to treat or prevent imminent or life-threatening deterioration. Critical care was time spent personally by me on the following activities: development of treatment plan with patient and/or surrogate as well as  nursing, discussions with consultants, evaluation of patient's response to treatment, examination of patient, obtaining history from patient or surrogate, ordering and performing treatments and interventions, ordering and review of laboratory studies, ordering and review of radiographic studies, pulse oximetry and re-evaluation of patient's condition.   Final Clinical Impressions(s) / ED Diagnoses   Final diagnoses:  Acute ischemic stroke [facility])    ED Discharge Orders    None  Ezequiel Essex, MD 07/30/17 [phone]

## 2017-07-30 ENCOUNTER — Other Ambulatory Visit: Payer: Self-pay

## 2017-07-30 ENCOUNTER — Inpatient Hospital Stay (HOSPITAL_BASED_OUTPATIENT_CLINIC_OR_DEPARTMENT_OTHER): Payer: Medicare Other

## 2017-07-30 ENCOUNTER — Encounter (HOSPITAL_COMMUNITY): Payer: Self-pay

## 2017-07-30 ENCOUNTER — Inpatient Hospital Stay (HOSPITAL_COMMUNITY): Payer: Medicare Other

## 2017-07-30 DIAGNOSIS — I639 Cerebral infarction, unspecified: Secondary | ICD-10-CM | POA: Diagnosis not present

## 2017-07-30 DIAGNOSIS — Z7901 Long term (current) use of anticoagulants: Secondary | ICD-10-CM

## 2017-07-30 DIAGNOSIS — E785 Hyperlipidemia, unspecified: Secondary | ICD-10-CM | POA: Diagnosis not present

## 2017-07-30 DIAGNOSIS — I6789 Other cerebrovascular disease: Secondary | ICD-10-CM | POA: Diagnosis not present

## 2017-07-30 DIAGNOSIS — Z952 Presence of prosthetic heart valve: Secondary | ICD-10-CM

## 2017-07-30 DIAGNOSIS — F1210 Cannabis abuse, uncomplicated: Secondary | ICD-10-CM | POA: Diagnosis present

## 2017-07-30 DIAGNOSIS — I6389 Other cerebral infarction: Secondary | ICD-10-CM | POA: Diagnosis not present

## 2017-07-30 DIAGNOSIS — I10 Essential (primary) hypertension: Secondary | ICD-10-CM

## 2017-07-30 LAB — LIPID PANEL
CHOL/HDL RATIO: 3.3 ratio
Cholesterol: 163 mg/dL (ref 0–200)
HDL: 49 mg/dL (ref >40)
LDL CALC: 104 mg/dL — AB (ref 0–99)
Triglycerides: 50 mg/dL (ref <150)
VLDL: 10 mg/dL (ref 0–40)

## 2017-07-30 LAB — ECHOCARDIOGRAM COMPLETE
Height: 72 in
WEIGHTICAEL: 2716.07 [oz_av]

## 2017-07-30 LAB — URINALYSIS, ROUTINE W REFLEX MICROSCOPIC
BILIRUBIN URINE: NEGATIVE
Glucose, UA: NEGATIVE mg/dL
HGB URINE DIPSTICK: NEGATIVE
KETONES UR: NEGATIVE mg/dL
Leukocytes, UA: NEGATIVE
NITRITE: NEGATIVE
PH: 5 (ref 5.0–8.0)
Protein, ur: NEGATIVE mg/dL
SPECIFIC GRAVITY, URINE: 1.003 — AB (ref 1.005–1.030)

## 2017-07-30 LAB — RAPID URINE DRUG SCREEN, HOSP PERFORMED
AMPHETAMINES: NOT DETECTED
BENZODIAZEPINES: NOT DETECTED
Barbiturates: NOT DETECTED
Cocaine: NOT DETECTED
OPIATES: NOT DETECTED
TETRAHYDROCANNABINOL: POSITIVE — AB

## 2017-07-30 LAB — HEPARIN LEVEL (UNFRACTIONATED): Heparin Unfractionated: >2.2 IU/mL — ABNORMAL HIGH (ref 0.30–0.70)

## 2017-07-30 LAB — HIV ANTIBODY (ROUTINE TESTING W REFLEX): HIV Screen 4th Generation wRfx: NONREACTIVE

## 2017-07-30 LAB — MRSA PCR SCREENING: MRSA BY PCR: NEGATIVE

## 2017-07-30 MED ORDER — ACETAMINOPHEN 160 MG/5ML PO SOLN: 650.0000 mg | ORAL | Status: DC | PRN

## 2017-07-30 MED ORDER — PRAVASTATIN SODIUM 40 MG PO TABS
40.0000 mg | ORAL_TABLET | Freq: Every day | ORAL | Status: DC
  Administered 2017-07-30: 40 mg via ORAL
  Filled 2017-07-30: qty 1

## 2017-07-30 MED ORDER — ASPIRIN EC 81 MG PO TBEC
81.0000 mg | DELAYED_RELEASE_TABLET | Freq: Every day | ORAL | Status: DC
  Administered 2017-07-30 – 2017-07-31 (×2): 81 mg via ORAL
  Filled 2017-07-30 (×2): qty 1

## 2017-07-30 MED ORDER — PERFLUTREN LIPID MICROSPHERE
1.0000 mL | INTRAVENOUS | Status: AC | PRN
  Administered 2017-07-30: 2 mL via INTRAVENOUS
  Filled 2017-07-30: qty 10

## 2017-07-30 MED ORDER — PNEUMOCOCCAL VAC POLYVALENT 25 MCG/0.5ML IJ INJ
0.5000 mL | INJECTION | INTRAMUSCULAR | Status: AC
  Administered 2017-07-31: 0.5 mL via INTRAMUSCULAR
  Filled 2017-07-30: qty 0.5

## 2017-07-30 MED ORDER — RIVAROXABAN 20 MG PO TABS
20.0000 mg | ORAL_TABLET | Freq: Every day | ORAL | Status: DC
  Administered 2017-07-30: 20 mg via ORAL
  Filled 2017-07-30: qty 1

## 2017-07-30 MED ORDER — ACETAMINOPHEN 650 MG RE SUPP: 650.0000 mg | RECTAL | Status: DC | PRN

## 2017-07-30 MED ORDER — STROKE: EARLY STAGES OF RECOVERY BOOK
Freq: Once | Status: DC
  Filled 2017-07-30 (×2): qty 1

## 2017-07-30 MED ORDER — ACETAMINOPHEN 325 MG PO TABS: 650.0000 mg | ORAL_TABLET | ORAL | Status: DC | PRN

## 2017-07-30 MED ORDER — POTASSIUM CHLORIDE CRYS ER 20 MEQ PO TBCR
40.0000 meq | EXTENDED_RELEASE_TABLET | Freq: Once | ORAL | Status: AC
  Administered 2017-07-30: 40 meq via ORAL
  Filled 2017-07-30: qty 2

## 2017-07-30 MED ORDER — PRAVASTATIN SODIUM 40 MG PO TABS: 20.0000 mg | ORAL_TABLET | Freq: Every day | ORAL | Status: DC

## 2017-07-30 NOTE — Progress Notes (Signed)
Occupational Therapy Evaluation Patient Details Name: [name] MRN: [id number] DOB: 1956/02/22 Today's Date: 07/30/2017    History of Present Illness Jeff [name] is a 61 y.o. male with medical history significant of Stroke, Mechanical aortic and mitral valve replacements, HTN, HLD.  Patient was apparently taken off of coumadin x3 weeks ago and switched to Xarelto he reports. Patient presented to the ED with c/o slurred speech onset with initial profound aphasia with word salad speech though this has improved significantly since arrival. TPA not used due to Xarelto a few hours PTA.  No large vessel occlusion on CTA head and neck. MRI Subtle 12 mm focus of diffusion abnormality within the subcortical white matter of the left frontoparietal region, suspicious for possible acute and/or early subacute white matter ischemia. No associated hemorrhage or mass effect   Clinical Impression   PTA, pt was living alone and independent with ADL and functional mobility. He was able to complete LB ADL and grooming tasks with modified independence this session. He did require supervision for ambulation in hallway with single instance of LOB when completing cognitive tasks during mobility. Pt additionally demonstrated decreased delayed recall and educated him on use of pill organizer and external memory aids for safety. Pt would benefit from continued OT services while admitted to improve safety with IADL tasks. Do not anticipate need for OT follow-up post-acute D/C.    Follow Up Recommendations  No OT follow up;Supervision - Intermittent    Equipment Recommendations  None recommended by OT    Recommendations for Other Services       Precautions / Restrictions Precautions Precautions: None Restrictions Weight Bearing Restrictions: No      Mobility Bed Mobility Overal bed mobility: Independent                Transfers Overall transfer level: Needs assistance Equipment used: Straight  cane Transfers: Sit to/from Stand Sit to Stand: Supervision         General transfer comment: Supervision for safety.     Balance Overall balance assessment: Needs assistance Sitting-balance support: No upper extremity supported;Feet supported Sitting balance-Leahy Scale: Normal     Standing balance support: No upper extremity supported Standing balance-Leahy Scale: Good                             ADL either performed or assessed with clinical judgement   ADL Overall ADL's : Needs assistance/impaired Eating/Feeding: Sitting;Modified independent   Grooming: Standing;Modified independent   Upper Body Bathing: Sitting;Modified independent   Lower Body Bathing: Sit to/from stand;Modified independent   Upper Body Dressing : Sitting;Modified independent   Lower Body Dressing: Sit to/from stand;Modified independent   Toilet Transfer: Ambulation;Supervision/safety Toilet Transfer Details (indicate cue type and reason): LOB x1 in hallway with distractions Toileting- Clothing Manipulation and Hygiene: Sit to/from stand;Modified independent       Functional mobility during ADLs: Supervision/safety;Cane General ADL Comments: Simulated real world environment with ambulation and functional tasks in hallway.      Vision Patient Visual Report: No change from baseline Vision Assessment?: Yes Eye Alignment: Within Functional Limits Ocular Range of Motion: Within Functional Limits Alignment/Gaze Preference: Within Defined Limits Tracking/Visual Pursuits: Able to track stimulus in all quads without difficulty Additional Comments: No deficits noted. Pt reports need for glasses.      Perception     Praxis      Pertinent Vitals/Pain Pain Assessment: No/denies pain     Hand Dominance Right  Extremity/Trunk Assessment Upper Extremity Assessment Upper Extremity Assessment: LUE deficits/detail LUE Deficits / Details: Noted slightly diminished L UE coordination  with functional reaching.    Lower Extremity Assessment Lower Extremity Assessment: Defer to PT evaluation LLE Deficits / Details: slight weakness due to prior stroke       Communication Communication Communication: No difficulties   Cognition Arousal/Alertness: Awake/alert Behavior During Therapy: WFL for tasks assessed/performed Overall Cognitive Status: No family/caregiver present to determine baseline cognitive functioning Area of Impairment: Memory                     Memory: Decreased short-term memory         General Comments: Decreased delayed recall. Discussed medication management strategies.    General Comments       Exercises     Shoulder Instructions      Home Living Family/patient expects to be discharged to:: Private residence Living Arrangements: Alone Available Help at Discharge: Friend(s) Type of Home: Apartment Home Access: Stairs to enter [employer] of Steps: 1 Entrance Stairs-Rails: None Home Layout: One level     Bathroom Shower/Tub: Teacher, early years/pre: Standard Bathroom Accessibility: Yes   Home Equipment: Cane - single point;Shower seat;Grab bars - toilet;Grab bars - tub/shower      Lives With: Alone    Prior Functioning/Environment Level of Independence: Independent with assistive device(s)        Comments: amb with cane        OT Problem List: Decreased activity tolerance;Impaired balance (sitting and/or standing);Decreased cognition      OT Treatment/Interventions:      OT Goals(Current goals can be found in the care plan section) Acute Rehab OT Goals Patient Stated Goal: go home OT Goal Formulation: With patient Time For Goal Achievement: 08/13/17 Potential to Achieve Goals: Good ADL Goals Pt Will Transfer to Toilet: with modified independence;ambulating;regular height toilet Additional ADL Goal #1: Pt will complete simple medication management task utilizing 2 external memory aids  to maximize safety and independence.  OT Frequency:     Barriers to D/C:            Co-evaluation              AM-PAC PT "6 Clicks" Daily Activity     Outcome Measure Help from another person eating meals?: None Help from another person taking care of personal grooming?: A Little Help from another person toileting, which includes using toliet, bedpan, or urinal?: A Little Help from another person bathing (including washing, rinsing, drying)?: A Little Help from another person to put on and taking off regular upper body clothing?: None Help from another person to put on and taking off regular lower body clothing?: A Little 6 Click Score: 20   End of Session Equipment Utilized During Treatment: (straight cane) Nurse Communication: Mobility status  Activity Tolerance: Patient tolerated treatment well Patient left: in bed;with call bell/phone within reach(sitting at EOB to eat lunch)  OT Visit Diagnosis: Other abnormalities of gait and mobility (R26.89);Other symptoms and signs involving cognitive function                Time: 1133-1150 OT Time Calculation (min): 17 min Charges:  OT General Charges $OT Visit: 1 Visit OT Evaluation $OT Eval Moderate Complexity: 1 Mod G-Codes:     Norman Herrlich, MS OTR/L  Pager: [place] [facility] 07/30/2017, 12:54 PM

## 2017-07-30 NOTE — ED Notes (Signed)
Pt returned from MRI °

## 2017-07-30 NOTE — Progress Notes (Signed)
NEUROHOSPITALISTS STROKE TEAM - DAILY PROGRESS NOTE   ADMISSION HISTORY:  Darrill Vreeland is a 61 y.o. male who has a history of mitral valve replacement with mitral valve per chart on Coumadin but says this was switched to Xarelto with last dose of Xarelto this evening, aortic valve replacement, multiple strokes in the past, hypertension and hyperlipidemia, coronary artery disease who presented to the emergency room via EMS when they were called by a neighbor who noticed that the patient speech was garbled. According to report, he was walking home from his sister's house, where he said he had had 24 ounces of an alcoholic beverage, and he was normal there.  Upon getting close to home, his speech started becoming garbled.  He asked a neighbor for help and made some sense and asking them to call EMS. EMS arrived on scene, found him to be aphasic, with no focal weakness or cranial nerve deficits. His systolic blood pressures were in the range of 120-140 throughout transportation. Upon arrival in the emergency room here, he was able to follow some commands.  He had profound expressive aphasia with a word salad speech. Upon confirming with him if he is on Coumadin, he said no.  He could not initially say what he was taking for his mechanical heart valves but soon after the CT, he was able to tell us that his anticoagulation had been switched to Xarelto by a clinic on [employer].  He could not tell us the name of the doctor.  CT angiogram of the head and neck and CT perfusion studies were performed, with concern for by a new area of ischemia in the left parietal hemisphere. TPA was not used because of last dose of Xarelto a few hours prior to presentation. Not an endovascular candidate because of no large vessel occlusion seen on CT angiogram of the head and neck  LKW: 10:30 PM on 07/30/2017 tpa given?: no, on Xarelto Premorbid modified Rankin scale  (mRS): 2 NIHSS 1a Level of Conscious.: 0 1b LOC Questions: 1 1c LOC Commands: 0 2 Best Gaze: 0 3 Visual: 0 4 Facial Palsy: 0 5a Motor Arm - left: 0 5b Motor Arm - Right: 0 6a Motor Leg - Left: 0 6b Motor Leg - Right: 0 7 Limb Ataxia: 0 8 Sensory: 0 9 Best Language: 2 10 Dysarthria: 1 11 Extinct. and Inatten.: 0 TOTAL: 3  SUBJECTIVE (INTERVAL HISTORY) No family is at the bedside. Patient is found laying in bed in NAD. Overall he feels his condition is gradually improving. Voices no new complaints. No new events reported overnight.  OBJECTIVE Lab Results: CBC:  Recent Labs  Lab 07/29/17 2305 07/29/17 2319  WBC 5.7  --   HGB 12.3* 13.3  HCT 36.3* 39.0  MCV 92.8  --   PLT 255  --    BMP: Recent Labs  Lab 07/29/17 2305 07/29/17 2319  NA 133* 138  K 3.2* 3.2*  CL 101 100*  CO2 20*  --   GLUCOSE 133* 138*  BUN 9 9  CREATININE 1.09 1.10  CALCIUM 9.2  --    Liver Function Tests:  Recent Labs  Lab 07/29/17 2305  AST 23  ALT 17  ALKPHOS 102  BILITOT 0.6  PROT 7.1  ALBUMIN 4.2   Coagulation Studies:  Recent Labs    07/29/17 2305  APTT 37*  INR 1.48   Urinalysis:  Recent Labs  Lab 07/30/17 0010  COLORURINE STRAW*  APPEARANCEUR CLEAR  LABSPEC 1.003*  PHURINE 5.0  GLUCOSEU NEGATIVE  HGBUR NEGATIVE  BILIRUBINUR NEGATIVE  KETONESUR NEGATIVE  PROTEINUR NEGATIVE  NITRITE NEGATIVE  LEUKOCYTESUR NEGATIVE   Urine Drug Screen:     Component Value Date/Time   LABOPIA NONE DETECTED 07/30/2017 0010   COCAINSCRNUR NONE DETECTED 07/30/2017 0010   LABBENZ NONE DETECTED 07/30/2017 0010   AMPHETMU NONE DETECTED 07/30/2017 0010   THCU POSITIVE (A) 07/30/2017 0010   LABBARB NONE DETECTED 07/30/2017 0010    Alcohol Level:  Recent Labs  Lab 07/29/17 2305  ETH 35*   PHYSICAL EXAM Temp:  [97.4 F (36.3 C)-98.4 F (36.9 C)] 98.4 F (36.9 C) (12/17 1136) Pulse Rate:  [75-102] 86 (12/17 1136) Resp:  [14-25] 15 (12/17 1136) BP: (121-152)/(85-103)  124/85 (12/17 1136) SpO2:  [98 %-100 %] 98 % (12/17 1136) Weight:  [77 kg (169 lb 12.1 oz)] 77 kg (169 lb 12.1 oz) (12/16 2300) General - Well nourished, well developed, in no apparent distress Respiratory - Lungs clear bilaterally. No wheezing. Cardiovascular - Regular rate and rhythm  NEURO:  Mental Status: AA&Ox 2.  Reported his age to be 6 when he is 45.  Was able to say he is in the hospital after multiple tries. Language: speech is mildly dysarthric.  Speech is fluent but naming and repetition are markedly impaired.  Comprehension is grossly intact.  Cranial Nerves: PERR. EOMI, visual fields full, no facial asymmetry, facial sensation intact, hearing intact, tongue/uvula/soft palate midline, normal sternocleidomastoid and trapezius muscle strength. No evidence of tongue atrophy or fibrillations Motor: 5/5 in all fours with no vertical drift Tone: is normal and bulk is normal Sensation- Intact to light touch bilaterally Coordination: FTN intact bilaterally Gait- deferred  IMAGING: I have personally reviewed the radiological images below and agree with the radiology interpretations. Ct Angio Head and neck and perfusion W Or Wo Contrast Result Date: 07/30/2017 IMPRESSION: 1. Negative CTA for emergent large vessel occlusion. 2. Perfusion defect at the posterior left frontal parietal region, suspicious for acute ischemia. Matched perfusion defect at the anterior left frontal lobe consistent with chronic infarction. 3. Atherosclerotic change involving the carotid bifurcations and intracranial vasculature as above. No high-grade or correctable stenosis. 4. **An incidental finding of potential clinical significance has been found. 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 Recommend annual imaging followup by CTA or MRA. This recommendation follows 2010 ACCF/AHA/AATS/ACR/ASA/SCA/SCAI/SIR/STS/SVM Guidelines for the Diagnosis and Management of Patients with Thoracic Aortic Disease.  Circulation. 2010; 121: [id number]** Electronically Signed   By: Jeannine Boga M.D.   On: 07/30/2017 00:33   Mr Brain Wo Contrast Result Date: 07/30/2017 IMPRESSION: 1. Subtle 12 mm focus of diffusion abnormality within the subcortical white matter of the left frontoparietal region, suspicious for possible acute and/or early subacute white matter ischemia. No associated hemorrhage or mass effect. 2. No other acute intracranial abnormality. 3. Multiple remote bilateral cerebral and cerebellar infarcts, stable. Electronically Signed   By: Jeannine Boga M.D.   On: 07/30/2017 04:31   Ct Head Code Stroke Wo Contrast Result Date: 07/29/2017 IMPRESSION: 1. No acute intracranial infarct or other abnormality identified. 2. ASPECTS is 10. 3. Multiple remote bilateral cerebral and cerebellar infarcts, stable from previous. Critical Value/emergent results were called by telephone at the time of interpretation on 07/29/2017 at 11:33 pm to Dr. Rory Percy , who verbally acknowledged these results. Electronically Signed   By: Jeannine Boga M.D.   On: 07/29/2017 23:37   Echocardiogram:  Study Conclusions - Left ventricle: The cavity size  was normal. Wall thickness was   increased in a pattern of mild LVH. Systolic function was normal.   The estimated ejection fraction was in the range of 50% to 55%.   Post op septal wall hypokinesis. Doppler parameters are   consistent with abnormal left ventricular relaxation (grade 1   diastolic dysfunction). - Aortic valve: A mechanical prosthesis was present. The prosthesis   had a normal range of motion. Peak velocity (S): 216 cm/s. Valve   area (VTI): 1.35 cm^2. Valve area (Vmax): 1.39 cm^2. Valve area   (Vmean): 1.43 cm^2. - Aorta: Ascending aortic diameter: 43 mm (S). - Ascending aorta: The ascending aorta was mildly dilated. - Mitral valve: A mechanical prosthesis was present. The prosthesis   had a normal range of motion. - Left atrium: The atrium  was moderately dilated. Impressions: - No cardiac source of emboli was indentified.     ASSESSMENT: Mr. Alix Stowers is a 61 y.o. male with PMH of mechanical mitral valve and mechanical aortic valve- was on Coumadin but currently on Xarelto (Xarelto is not indicated for mechanical valves), with Hx of multiple strokes in the past, hypertension hyperlipidemia coronary artery disease presented to the EMS for evaluation of acute onset of expressive aphasia and mild dysarthria. Noncontrast CT of the head - no acute changes, might have a possible dense left MCA. CT angiogram of the head and neck - No large vessel occlusion that might have been amenable to endovascular intervention. CT perfusion study showed a small area of penumbra in the left parietal area with no core which might reflect a new area of ischemia and also a left frontal area of core only possibly reflecting the old infarcts from prior stroke. TPA is contraindicated. No large vessel occlusion for endovascular procedure.  Subtle 12 mm focus of diffusion abnormality within the subcortical white matter of the left frontoparietal region, suspicious for possible acute and/or early subacute white matter ischemia.  STROKE:  Suspected Etiology: likely embolic from the heart because of mechanical valves and possibly suboptimal anticoagulation. Resultant Symptoms: expressive aphasia and mild dysarthria -resolved today Stroke Risk Factors: hyperlipidemia, hypertension and smoking Other Stroke Risk Factors: Advanced age, Polysubstance abuse, Hx stroke  Coronary artery disease  Outstanding Stroke Work-up Studies:   Workup completed  07/30/2017: Neuro exam remained stable.  Expressive aphasia resolved.  Very mild dysarthria noted on exam today.  Patient counseled on lifestyle modification including stopping marijuana use, stopping smoking cigarettes and moderation of alcohol intake.  Stressed importance of medication compliance and follow-up with  primary care provider.  Will restart Xarelto today and add aspirin 81 mg daily.  Will increase Pravachol to 40 mg daily.  Patient will need to follow-up with neurology in 6 weeks at the stroke clinic.  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 Long term BP goal normotensive. May slowly restart home B/P medications after 48 hours Home Meds: Norvasc, Lisinopril, HCTZ  HYPERLIPIDEMIA:    Component Value Date/Time   CHOL 163 07/30/2017 0700   TRIG 50 07/30/2017 0700   HDL 49 07/30/2017 0700   CHOLHDL 3.3 07/30/2017 0700   VLDL 10 07/30/2017 0700   LDLCALC 104 (H) 07/30/2017 0700  Home Meds:  Pravachol 20 mg LDL  goal < 70 Increased to  Pravachol 40 mg daily Continue statin at discharge  R/O DIABETES: Lab Results  Component Value Date   HGBA1C 5.4 03/20/2015  HgbA1c goal < 7.0  TOBACCO ABUSE & POLYSUBSTANCE ABUSE UDS+ Current smoker Smoking cessation counseling provided Nicotine patch provided  Other Active Problems: Principal Problem:   Acute embolic stroke ([place]) Active Problems:   Hx of aortic valve replacement,  mechanical   H/O mitral valve replacement with mechanical valve   Hyperlipidemia   Hypertension  Hospital day # 0 VTE prophylaxis: SCD's  Diet : Diet regular Room service appropriate? Yes; Fluid consistency: Thin   FAMILY UPDATES: No family at bedside  TEAM UPDATES: Cherene Altes, MD     Prior Home Stroke Medications:  Xarelto (rivaroxaban) daily  Discharge Stroke Meds:  Please discharge patient on aspirin 81 mg daily and Xarelto (rivaroxaban) daily   Disposition: 01-Home or Self Care Therapy Recs:               NONE Home Equipment:         NONE Follow Up:  Follow-up Information    Garvin Fila, MD. Schedule an appointment as soon as possible for a visit in 6 week(s).   Specialties:  Neurology, Radiology Contact information: [address] [place] [address] [phone]          Kerin Perna, NP -PCP Follow up in 1-2 weeks    [facility] Stroke Neurology Team 07/30/2017 12:30 PM  Neurology to sign-off at this time. Please call with any further questions or concerns. Thank you for this consultation.  To contact Stroke Continuity provider, please refer to [email or web address] After hours, contact General Neurology  ATTENDING NOTE: I reviewed above note and agree with the assessment and plan. I have made any additions or clarifications directly to the above note. Pt was seen and examined.   61 year old male with history of mitral wall and aortic wall repair and mechanical wall, noncompliance with coumadin and now on Xarelto, previous strokes, hyperlipidemia, hypertension, CAD, smoker, alcohol abuse admitted for transient aphasia. Now resolved. He has hx of stroke in 02/2011 at right cerebellar due to low INR on coumadin, again in 07/2013 with left cerebellar infarct with low INR, and 03/2015 with left MCA infarct again low INR. CUS neg, and EF 40-45% at that time. Since then, he has been followed with Dr. Leonie Man at [facility] and coumadin changed to  [place].   This admission, pt aphasia resolved and CT showed chronic b/l cerebellar and MCA infarcts. CTA h/n unremarkable with only b/l ICA proximal athero. MRI showed left CR small infarct. EF 50-55% and LDL 105 with A1C pending. UDS with positive THC. Pt still smoking. He stated that he is compliant with Xarelto.  Will recommend to resume Xarelto given small size of infarct, and also add ASA 81mg  on top of Xarelto for further stroke prevention, given he is compliant with Xarelto and current stroke resembles small vessel disease. Educated on cessation of THC and smoking. Continue pravastatin  but increase to 40mg  daily. He will continue to follow up with Dr. Leonie Man at [facility] in 6-8 weeks.   Neurology will sign off. Please call with questions. Pt will follow up with Dr. Leonie Man at [facility] in about 6-8 weeks. Thanks for the consult.  Rosalin Hawking, MD PhD Stroke Neurology 07/30/2017 1:57 PM

## 2017-07-30 NOTE — Progress Notes (Signed)
  Echocardiogram 2D Echocardiogram with definity has been performed.  Katlynne Mckercher L Androw 07/30/2017, 8:58 AM

## 2017-07-30 NOTE — ED Triage Notes (Signed)
Here via [facility] with c/o possible CVA. Pt was walking home from sisters house and started feeling numbness to right hand and then having trouble speaking. Went to a neighbors house and they called 911. Last seen normal time is 2235. No weakness in any extremity, only s/s is aphasic and dysarthria. Prior hx of multiple cva. Pt has garbled speech upon arrival. Equal grips bilat. To ct scan upon arrival.

## 2017-07-30 NOTE — Progress Notes (Signed)
[place] TEAM 1 - Stepdown/ICU TEAM  Encarnacion Scioneaux  KPT:[id number] DOB: Mar 12, 1956 DOA: 07/29/2017 PCP: Kerin Perna, NP    Brief Narrative:  61 y.o. male with a history of multiple prior strokes mechanical aortic and mitral valve replacements, HTN, and HLD.  Patient was reportedly taken off of coumadin and switched to Xarelto 3 weeks prior to this admit.    Patient presented to the ED with the acute onset of slurred speech.  He initially had profound aphasia with word salad speech though this improved after arrival.  TPA was not used due to Xarelto a few hours PTA.  No large vessel occlusion noted on CTA head and neck.  Subjective: Pt is seen for a f/u visit.    Assessment & Plan:  Acute ischemic stroke likely embolic from the heart because of mechanical valves and possibly suboptimal anticoagulation - Neurology following - permissive HTN for 24-48hrs - Neuro to advise on timing on anticoag  HLD  HTN  S/P mechanical aortic and mitral valves Need to resume anticoag asap  Hypokalemia  DVT prophylaxis: SCDs Code Status: FULL CODE Family Communication: no family present at time of exam  Disposition Plan:   Consultants:  Neurology   Procedures: Pending   Antimicrobials:  none   Objective: Blood pressure 128/88, pulse 78, temperature 98 F (36.7 C), temperature source Oral, resp. rate 15, height 6' (1.829 m), weight 77 kg (169 lb 12.1 oz), SpO2 98 %.  Intake/Output Summary (Last 24 hours) at 07/30/2017 1040 Last data filed at 07/30/2017 0508 Gross per 24 hour  Intake -  Output 650 ml  Net -650 ml   Filed Weights   07/29/17 2300  Weight: 77 kg (169 lb 12.1 oz)    Examination: Pt was seen for a f/u visit.    CBC: Recent Labs  Lab 07/29/17 2305 07/29/17 2319  WBC 5.7  --   NEUTROABS 4.1  --   HGB 12.3* 13.3  HCT 36.3* 39.0  MCV 92.8  --   PLT 255  --    Basic Metabolic Panel: Recent Labs  Lab 07/29/17 2305 07/29/17 2319  NA 133* 138  K  3.2* 3.2*  CL 101 100*  CO2 20*  --   GLUCOSE 133* 138*  BUN 9 9  CREATININE 1.09 1.10  CALCIUM 9.2  --    GFR: Estimated Creatinine Clearance: 74.9 mL/min (by C-G formula based on SCr of 1.1 mg/dL).  Liver Function Tests: Recent Labs  Lab 07/29/17 2305  AST 23  ALT 17  ALKPHOS 102  BILITOT 0.6  PROT 7.1  ALBUMIN 4.2   Coagulation Profile: Recent Labs  Lab 07/29/17 2305  INR 1.48     HbA1C: Hgb A1c MFr Bld  Date/Time Value Ref Range Status  03/20/2015 04:22 AM 5.4 4.8 - 5.6 % Final    Comment:    (NOTE)         Pre-diabetes: 5.7 - 6.4         Diabetes: >6.4         Glycemic control for adults with diabetes: <7.0   07/12/2013 12:08 PM 5.2 <5.7 % Final    Comment:    (NOTE)  According to the ADA Clinical Practice Recommendations for 2011, when HbA1c is used as a screening test:  >=6.5%   Diagnostic of Diabetes Mellitus           (if abnormal result is confirmed) 5.7-6.4%   Increased risk of developing Diabetes Mellitus References:Diagnosis and Classification of Diabetes Mellitus,Diabetes TMLY,6503,54(SFKCL 1):S62-S69 and Standards of Medical Care in         Diabetes - 2011,Diabetes EXNT,7001,74 (Suppl 1):S11-S61.     Scheduled Meds: .  stroke: mapping our early stages of recovery book   Does not apply Once  . [START ON 07/31/2017] pneumococcal 23 valent vaccine  0.5 mL Intramuscular Tomorrow-1000  . pravastatin  20 mg Oral q1800     LOS: 0 days   Time spent: No Charge  Cherene Altes, MD [facility] Office  [phone] Pager - Text Page per Amion as per below:  On-Call/Text Page:      [email or web address]      password [email or web address]  If 7PM-7AM, please contact night-coverage [email or web address] Password [email or web address] 07/30/2017, 10:40 AM

## 2017-07-30 NOTE — ED Notes (Signed)
Pt in MRI at this time 

## 2017-07-30 NOTE — H&P (Signed)
History and Physical    Jeff [name]:[id number] DOB: [birth year] DOA: 07/29/2017  PCP: Jeff Perna, NP  Patient coming from: Home  I have personally briefly reviewed patient's old medical records in [place]  Chief Complaint: Slurred Speech  HPI: Jeff [name] is a 61 y.o. male with medical history significant of Stroke, Mechanical aortic and mitral valve replacements, HTN, HLD.  Patient was apparently taken off of coumadin x3 weeks ago and switched to Xarelto he reports.  He reports he last took Xarelto earlier this afternoon.  Patient presents to the ED with c/o slurred speech onset this evening while walking to sisters house.  EMS called.  Patient brought to ED.   ED Course: Initially had profound aphasia with word salad speech though this has improved significantly since arrival.  TPA not used due to Xarelto a few hours PTA.  No large vessel occlusion on CTA head and neck.   Review of Systems: As per HPI otherwise 10 point review of systems negative.   Past Medical History:  Diagnosis Date  . H/O mitral valve replacement with mechanical valve   . Heart disease   . High cholesterol   . History of CVA (cerebrovascular accident)    02/2011  . Hx of aortic valve replacement, mechanical   . HX: long term anticoagulant use   . Stroke [facility])     Past Surgical History:  Procedure Laterality Date  . CARDIAC VALVE REPLACEMENT       reports that he has been smoking cigarettes.  He has been smoking about 0.05 packs per day. he has never used smokeless tobacco. He reports that he drinks about 0.6 oz of alcohol per week. He reports that he uses drugs. Drug: Marijuana. Frequency: 7.00 times per week.  No Known Allergies  Family History  Problem Relation Age of Onset  . Diabetes Mother        Died before her 8 birthday  . Heart attack Brother      Prior to Admission medications   Medication Sig Start Date End Date Taking? Authorizing Provider  amLODipine  (NORVASC) 5 MG tablet Take 5 mg by mouth daily with supper. 07/26/17  Yes [provider]  hydrochlorothiazide (HYDRODIURIL) 25 MG tablet Take 25 mg by mouth daily with supper.  03/05/15  Yes [provider]  lisinopril (PRINIVIL,ZESTRIL) 20 MG tablet Take 20 mg by mouth daily with supper.  03/05/15  Yes [provider]  pravastatin (PRAVACHOL) 20 MG tablet Take 20 mg by mouth daily with supper.  05/03/15  Yes [provider]  rivaroxaban (XARELTO) 20 MG TABS tablet Take 20 mg by mouth daily with supper.   Yes [provider]  tadalafil (CIALIS) 5 MG tablet Take 5 mg by mouth daily as needed for erectile dysfunction.  07/27/17  Yes [provider]  warfarin (COUMADIN) 10 MG tablet Take 1 tablet (10 mg total) by mouth daily at 6 PM. Patient not taking: Reported on 07/30/2017 07/29/13   Cathlyn Parsons, PA-C    Physical Exam: Vitals:   07/30/17 0015 07/30/17 0019 07/30/17 0030 07/30/17 0045  BP: (!) 136/98 (!) 136/98 (!) 136/93 (!) 134/103  Pulse: (!) 102 92 88 90  Resp: (!) 25 16 (!) 21 16  Temp:  (!) 97.4 F (36.3 C)    TempSrc:  Oral    SpO2: 99% 100% 100% 100%  Weight:      Height:        Constitutional: NAD, calm, comfortable  Eyes: PERRL, lids and conjunctivae normal ENMT: Mucous membranes are moist. Posterior pharynx clear of any exudate or lesions.Normal dentition.  Neck: normal, supple, no masses, no thyromegaly Respiratory: clear to auscultation bilaterally, no wheezing, no crackles. Normal respiratory effort. No accessory muscle use.  Cardiovascular: Regular rate and rhythm, no murmurs / rubs / gallops. No extremity edema. 2+ pedal pulses. No carotid bruits.  Abdomen: no tenderness, no masses palpated. No hepatosplenomegaly. Bowel sounds positive.  Musculoskeletal: no clubbing / cyanosis. No joint deformity upper and lower extremities. Good ROM, no contractures. Normal muscle tone.  Skin: no rashes, lesions, ulcers. No  induration Neurologic: Speech still slurred, but seems to be somewhat improved at this time from what Dr. Johny Chess initial note is describing. Psychiatric: Normal judgment and insight. Alert and oriented x 3. Normal mood.    Labs on Admission: I have personally reviewed following labs and imaging studies  CBC: Recent Labs  Lab 07/29/17 2305 07/29/17 2319  WBC 5.7  --   NEUTROABS 4.1  --   HGB 12.3* 13.3  HCT 36.3* 39.0  MCV 92.8  --   PLT 255  --    Basic Metabolic Panel: Recent Labs  Lab 07/29/17 2305 07/29/17 2319  NA 133* 138  K 3.2* 3.2*  CL 101 100*  CO2 20*  --   GLUCOSE 133* 138*  BUN 9 9  CREATININE 1.09 1.10  CALCIUM 9.2  --    GFR: Estimated Creatinine Clearance: 74.9 mL/min (by C-G formula based on SCr of 1.1 mg/dL). Liver Function Tests: Recent Labs  Lab 07/29/17 2305  AST 23  ALT 17  ALKPHOS 102  BILITOT 0.6  PROT 7.1  ALBUMIN 4.2   No results for input(s): LIPASE, AMYLASE in the last 168 hours. No results for input(s): AMMONIA in the last 168 hours. Coagulation Profile: Recent Labs  Lab 07/29/17 2305  INR 1.48   Cardiac Enzymes: No results for input(s): CKTOTAL, CKMB, CKMBINDEX, TROPONINI in the last 168 hours. BNP (last 3 results) No results for input(s): PROBNP in the last 8760 hours. HbA1C: No results for input(s): HGBA1C in the last 72 hours. CBG: No results for input(s): GLUCAP in the last 168 hours. Lipid Profile: No results for input(s): CHOL, HDL, LDLCALC, TRIG, CHOLHDL, LDLDIRECT in the last 72 hours. Thyroid Function Tests: No results for input(s): TSH, T4TOTAL, FREET4, T3FREE, THYROIDAB in the last 72 hours. Anemia Panel: No results for input(s): VITAMINB12, FOLATE, FERRITIN, TIBC, IRON, RETICCTPCT in the last 72 hours. Urine analysis:    Component Value Date/Time   COLORURINE STRAW (A) 07/30/2017 0010   APPEARANCEUR CLEAR 07/30/2017 0010   LABSPEC 1.003 (L) 07/30/2017 0010   PHURINE 5.0 07/30/2017 0010   GLUCOSEU  NEGATIVE 07/30/2017 0010   HGBUR NEGATIVE 07/30/2017 0010   BILIRUBINUR NEGATIVE 07/30/2017 0010   KETONESUR NEGATIVE 07/30/2017 0010   PROTEINUR NEGATIVE 07/30/2017 0010   UROBILINOGEN 1.0 07/10/2013 1401   NITRITE NEGATIVE 07/30/2017 0010   LEUKOCYTESUR NEGATIVE 07/30/2017 0010    Radiological Exams on Admission: Ct Angio Head W Or Wo Contrast  Result Date: 07/30/2017 CLINICAL DATA:  Initial evaluation for acute aphasia, concern for stroke. EXAM: CT ANGIOGRAPHY HEAD AND NECK CT PERFUSION BRAIN TECHNIQUE: Multidetector CT imaging of the head and neck was performed using the standard protocol during bolus administration of intravenous contrast. Multiplanar CT image reconstructions and MIPs were obtained to evaluate the vascular anatomy. Carotid stenosis measurements (when applicable) are obtained utilizing NASCET criteria, using the distal internal carotid diameter as the  denominator. Multiphase CT imaging of the brain was performed following IV bolus contrast injection. Subsequent parametric perfusion maps were calculated using RAPID software. CONTRAST:  81mL ISOVUE-370 IOPAMIDOL (ISOVUE-370) INJECTION 76% COMPARISON:  Prior CT from earlier same day. FINDINGS: CTA NECK FINDINGS Aortic arch: Dilatation of the ascending aorta up to 4.3 cm. Normal three-vessel branch pattern present at the aortic arch. No flow-limiting stenosis about the origin of the great vessels. Atheromatous plaque at the undersurface of the arch itself. Visualized subclavian artery is widely patent. Right carotid system: Right common carotid artery patent from its origin to the bifurcation. Minimal plaque about the right bifurcation without significant stenosis. Right ICA widely patent distally to the skullbase without stenosis, dissection, or occlusion. Left carotid system: Left common carotid artery patent from its origin to the bifurcation without stenosis. Nearly concentric calcified plaque about the left bifurcation without  flow-limiting stenosis. Left ICA patent distally to the skullbase without stenosis, dissection, or occlusion. Vertebral arteries: Both of the vertebral arteries arise from the subclavian arteries. Left vertebral artery dominant. Proximal V2 segments not well evaluated due to adjacent venous contamination. Vertebral arteries grossly patent to skullbase without stenosis or occlusion. Skeleton: No acute osseus abnormality. No worrisome lytic or blastic osseous lesions. Moderate degenerative spondylolysis at C5-6 and C6-7. Median sternotomy wires partially visualized. Other neck: No acute soft tissue abnormality within the neck. No adenopathy. Salivary glands within normal limits. Thyroid normal. Upper chest: Visualized upper chest within normal limits. Partially visualized lungs are clear. Review of the MIP images confirms the above findings CTA HEAD FINDINGS Anterior circulation: Petrous segments widely patent bilaterally. Mild atheromatous plaque within the cavernous/ supraclinoid ICAs without hemodynamically significant stenosis. Origin of the ophthalmic artery is normal. ICA termini widely patent. A1 segments patent bilaterally. Normal anterior communicating artery. Anterior cerebral artery is widely patent to their distal aspects. M1 segments patent bilaterally without stenosis. Right M1 bifurcates early. Left M1 divide somewhat late in the sylvian fissure. No proximal M2 occlusion. Distal MCA branches well perfused and fairly symmetric. Posterior circulation: Vertebral arteries patent to the vertebrobasilar junction without stenosis left vertebral artery dominant. Posterior inferior cerebral arteries patent bilaterally. Basilar artery patent to its distal aspect without stenosis. Superior cerebral arteries patent proximally. Both of the posterior cerebral arteries primarily supplied via the basilar and are patent to their distal aspects. Venous sinuses: Patent. Anatomic variants: None significant.  No aneurysm.  Delayed phase: Not performed. Review of the MIP images confirms the above findings CT Brain Perfusion Findings: CBF (<30%) Volume: 31mL Perfusion (Tmax>6.0s) volume: 80mL Mismatch Volume: 75mL Infarction Location:Delayed perfusion within the posterior left frontoparietal region, suspicious for possible evolving ischemia. Matched perfusion defect at the anterior left frontal lobe consistent with chronic infarct in this region. Additional bifrontal perfusion abnormalities felt to be artifactual. IMPRESSION: 1. Negative CTA for emergent large vessel occlusion. 2. Perfusion defect at the posterior left frontal parietal region, suspicious for acute ischemia. Matched perfusion defect at the anterior left frontal lobe consistent with chronic infarction. 3. Atherosclerotic change involving the carotid bifurcations and intracranial vasculature as above. No high-grade or correctable stenosis. 4. **An incidental finding of potential clinical significance has been found. 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 Recommend annual imaging followup by CTA or MRA. This recommendation follows 2010 ACCF/AHA/AATS/ACR/ASA/SCA/SCAI/SIR/STS/SVM Guidelines for the Diagnosis and Management of Patients with Thoracic Aortic Disease. Circulation. 2010; 121: [id number]** Electronically Signed   By: Jeannine Boga M.D.   On: 07/30/2017 00:33   Ct Angio  Neck W Or Wo Contrast  Result Date: 07/30/2017 CLINICAL DATA:  Initial evaluation for acute aphasia, concern for stroke. EXAM: CT ANGIOGRAPHY HEAD AND NECK CT PERFUSION BRAIN TECHNIQUE: Multidetector CT imaging of the head and neck was performed using the standard protocol during bolus administration of intravenous contrast. Multiplanar CT image reconstructions and MIPs were obtained to evaluate the vascular anatomy. Carotid stenosis measurements (when applicable) are obtained utilizing NASCET criteria, using the distal internal carotid diameter as the denominator.  Multiphase CT imaging of the brain was performed following IV bolus contrast injection. Subsequent parametric perfusion maps were calculated using RAPID software. CONTRAST:  63mL ISOVUE-370 IOPAMIDOL (ISOVUE-370) INJECTION 76% COMPARISON:  Prior CT from earlier same day. FINDINGS: CTA NECK FINDINGS Aortic arch: Dilatation of the ascending aorta up to 4.3 cm. Normal three-vessel branch pattern present at the aortic arch. No flow-limiting stenosis about the origin of the great vessels. Atheromatous plaque at the undersurface of the arch itself. Visualized subclavian artery is widely patent. Right carotid system: Right common carotid artery patent from its origin to the bifurcation. Minimal plaque about the right bifurcation without significant stenosis. Right ICA widely patent distally to the skullbase without stenosis, dissection, or occlusion. Left carotid system: Left common carotid artery patent from its origin to the bifurcation without stenosis. Nearly concentric calcified plaque about the left bifurcation without flow-limiting stenosis. Left ICA patent distally to the skullbase without stenosis, dissection, or occlusion. Vertebral arteries: Both of the vertebral arteries arise from the subclavian arteries. Left vertebral artery dominant. Proximal V2 segments not well evaluated due to adjacent venous contamination. Vertebral arteries grossly patent to skullbase without stenosis or occlusion. Skeleton: No acute osseus abnormality. No worrisome lytic or blastic osseous lesions. Moderate degenerative spondylolysis at C5-6 and C6-7. Median sternotomy wires partially visualized. Other neck: No acute soft tissue abnormality within the neck. No adenopathy. Salivary glands within normal limits. Thyroid normal. Upper chest: Visualized upper chest within normal limits. Partially visualized lungs are clear. Review of the MIP images confirms the above findings CTA HEAD FINDINGS Anterior circulation: Petrous segments widely  patent bilaterally. Mild atheromatous plaque within the cavernous/ supraclinoid ICAs without hemodynamically significant stenosis. Origin of the ophthalmic artery is normal. ICA termini widely patent. A1 segments patent bilaterally. Normal anterior communicating artery. Anterior cerebral artery is widely patent to their distal aspects. M1 segments patent bilaterally without stenosis. Right M1 bifurcates early. Left M1 divide somewhat late in the sylvian fissure. No proximal M2 occlusion. Distal MCA branches well perfused and fairly symmetric. Posterior circulation: Vertebral arteries patent to the vertebrobasilar junction without stenosis left vertebral artery dominant. Posterior inferior cerebral arteries patent bilaterally. Basilar artery patent to its distal aspect without stenosis. Superior cerebral arteries patent proximally. Both of the posterior cerebral arteries primarily supplied via the basilar and are patent to their distal aspects. Venous sinuses: Patent. Anatomic variants: None significant.  No aneurysm. Delayed phase: Not performed. Review of the MIP images confirms the above findings CT Brain Perfusion Findings: CBF (<30%) Volume: 4mL Perfusion (Tmax>6.0s) volume: 80mL Mismatch Volume: 41mL Infarction Location:Delayed perfusion within the posterior left frontoparietal region, suspicious for possible evolving ischemia. Matched perfusion defect at the anterior left frontal lobe consistent with chronic infarct in this region. Additional bifrontal perfusion abnormalities felt to be artifactual. IMPRESSION: 1. Negative CTA for emergent large vessel occlusion. 2. Perfusion defect at the posterior left frontal parietal region, suspicious for acute ischemia. Matched perfusion defect at the anterior left frontal lobe consistent with chronic infarction. 3. Atherosclerotic change involving the  carotid bifurcations and intracranial vasculature as above. No high-grade or correctable stenosis. 4. **An incidental  finding of potential clinical significance has been found. 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 Recommend annual imaging followup by CTA or MRA. This recommendation follows 2010 ACCF/AHA/AATS/ACR/ASA/SCA/SCAI/SIR/STS/SVM Guidelines for the Diagnosis and Management of Patients with Thoracic Aortic Disease. Circulation. 2010; 121: [id number]** Electronically Signed   By: Jeannine Boga M.D.   On: 07/30/2017 00:33   Ct Cerebral Perfusion W Contrast  Result Date: 07/30/2017 CLINICAL DATA:  Initial evaluation for acute aphasia, concern for stroke. EXAM: CT ANGIOGRAPHY HEAD AND NECK CT PERFUSION BRAIN TECHNIQUE: Multidetector CT imaging of the head and neck was performed using the standard protocol during bolus administration of intravenous contrast. Multiplanar CT image reconstructions and MIPs were obtained to evaluate the vascular anatomy. Carotid stenosis measurements (when applicable) are obtained utilizing NASCET criteria, using the distal internal carotid diameter as the denominator. Multiphase CT imaging of the brain was performed following IV bolus contrast injection. Subsequent parametric perfusion maps were calculated using RAPID software. CONTRAST:  100mL ISOVUE-370 IOPAMIDOL (ISOVUE-370) INJECTION 76% COMPARISON:  Prior CT from earlier same day. FINDINGS: CTA NECK FINDINGS Aortic arch: Dilatation of the ascending aorta up to 4.3 cm. Normal three-vessel branch pattern present at the aortic arch. No flow-limiting stenosis about the origin of the great vessels. Atheromatous plaque at the undersurface of the arch itself. Visualized subclavian artery is widely patent. Right carotid system: Right common carotid artery patent from its origin to the bifurcation. Minimal plaque about the right bifurcation without significant stenosis. Right ICA widely patent distally to the skullbase without stenosis, dissection, or occlusion. Left carotid system: Left common carotid artery patent from  its origin to the bifurcation without stenosis. Nearly concentric calcified plaque about the left bifurcation without flow-limiting stenosis. Left ICA patent distally to the skullbase without stenosis, dissection, or occlusion. Vertebral arteries: Both of the vertebral arteries arise from the subclavian arteries. Left vertebral artery dominant. Proximal V2 segments not well evaluated due to adjacent venous contamination. Vertebral arteries grossly patent to skullbase without stenosis or occlusion. Skeleton: No acute osseus abnormality. No worrisome lytic or blastic osseous lesions. Moderate degenerative spondylolysis at C5-6 and C6-7. Median sternotomy wires partially visualized. Other neck: No acute soft tissue abnormality within the neck. No adenopathy. Salivary glands within normal limits. Thyroid normal. Upper chest: Visualized upper chest within normal limits. Partially visualized lungs are clear. Review of the MIP images confirms the above findings CTA HEAD FINDINGS Anterior circulation: Petrous segments widely patent bilaterally. Mild atheromatous plaque within the cavernous/ supraclinoid ICAs without hemodynamically significant stenosis. Origin of the ophthalmic artery is normal. ICA termini widely patent. A1 segments patent bilaterally. Normal anterior communicating artery. Anterior cerebral artery is widely patent to their distal aspects. M1 segments patent bilaterally without stenosis. Right M1 bifurcates early. Left M1 divide somewhat late in the sylvian fissure. No proximal M2 occlusion. Distal MCA branches well perfused and fairly symmetric. Posterior circulation: Vertebral arteries patent to the vertebrobasilar junction without stenosis left vertebral artery dominant. Posterior inferior cerebral arteries patent bilaterally. Basilar artery patent to its distal aspect without stenosis. Superior cerebral arteries patent proximally. Both of the posterior cerebral arteries primarily supplied via the  basilar and are patent to their distal aspects. Venous sinuses: Patent. Anatomic variants: None significant.  No aneurysm. Delayed phase: Not performed. Review of the MIP images confirms the above findings CT Brain Perfusion Findings: CBF (<30%) Volume: 20mL Perfusion (Tmax>6.0s) volume: 90mL Mismatch Volume:  35mL Infarction Location:Delayed perfusion within the posterior left frontoparietal region, suspicious for possible evolving ischemia. Matched perfusion defect at the anterior left frontal lobe consistent with chronic infarct in this region. Additional bifrontal perfusion abnormalities felt to be artifactual. IMPRESSION: 1. Negative CTA for emergent large vessel occlusion. 2. Perfusion defect at the posterior left frontal parietal region, suspicious for acute ischemia. Matched perfusion defect at the anterior left frontal lobe consistent with chronic infarction. 3. Atherosclerotic change involving the carotid bifurcations and intracranial vasculature as above. No high-grade or correctable stenosis. 4. **An incidental finding of potential clinical significance has been found. 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 Recommend annual imaging followup by CTA or MRA. This recommendation follows 2010 ACCF/AHA/AATS/ACR/ASA/SCA/SCAI/SIR/STS/SVM Guidelines for the Diagnosis and Management of Patients with Thoracic Aortic Disease. Circulation. 2010; 121: [id number]** Electronically Signed   By: Jeannine Boga M.D.   On: 07/30/2017 00:33   Ct Head Code Stroke Wo Contrast  Result Date: 07/29/2017 CLINICAL DATA:  Code stroke. Initial evaluation for acute speech difficulty. EXAM: CT HEAD WITHOUT CONTRAST TECHNIQUE: Contiguous axial images were obtained from the base of the skull through the vertex without intravenous contrast. COMPARISON:  Prior CT from 03/19/2015. FINDINGS: Brain: Atrophy with mild chronic small vessel ischemic disease. Multiple remote cortical infarcts seen involving the  bilateral frontal lobes, right parietal lobe, as well as the bilateral cerebral hemispheres. No evidence for acute large vessel territory infarct. No acute intracranial hemorrhage. No mass lesion or midline shift. No hydrocephalus. No extra-axial fluid collection. Vascular: No asymmetric hyperdense vessel. Scattered vascular calcifications noted within the carotid siphons. Skull: Scalp soft tissues and calvarium within normal limits. Left temple scalp lipoma noted. Calvarium intact. Sinuses/Orbits: Globes and oval soft tissues within normal limits. Paranasal sinuses are clear. No mastoid effusion. Other: None. ASPECTS [facility] Stroke Program Early CT Score) - Ganglionic level infarction (caudate, lentiform nuclei, internal capsule, insula, M1-M3 cortex): 7 - Supraganglionic infarction (M4-M6 cortex): 3 Total score (0-10 with 10 being normal): 10 IMPRESSION: 1. No acute intracranial infarct or other abnormality identified. 2. ASPECTS is 10. 3. Multiple remote bilateral cerebral and cerebellar infarcts, stable from previous. Critical Value/emergent results were called by telephone at the time of interpretation on 07/29/2017 at 11:33 pm to Dr. Rory Percy , who verbally acknowledged these results. Electronically Signed   By: Jeannine Boga M.D.   On: 07/29/2017 23:37    EKG: Independently reviewed.  Assessment/Plan Principal Problem:   Acute embolic stroke [facility]) Active Problems:   Hx of aortic valve replacement, mechanical   H/O mitral valve replacement with mechanical valve   Hyperlipidemia   Hypertension    1. Likely acute embolic stroke - in setting of 2x mechanical valves 1. Stroke pathway 2. MRI brain 3. Stop Xarelto 4. Depending on size of stroke, neuro will decide timing of starting heparin gtt bridge to coumadin, Dr. Rory Percy instructs me to not order this for now. 5. 2d echo 6. PT/OT/SLP 2. HLD - Continue statin 3. HTN - Holding BP meds and allowing permissive HTN  DVT prophylaxis:  Holding Xarelto as above Code Status: Full Family Communication: Family at bedside Disposition Plan: Home after admit Consults called: Neuro Admission status: Admit to inpatient   Etta Quill DO [facility] Pager [phone]  If 7AM-7PM, please contact day team taking care of patient [email or web address] Password [email or web address]  07/30/2017, 1:07 AM

## 2017-07-30 NOTE — Progress Notes (Signed)
  Speech Language Pathology   Patient Details Name: Jeff [name] MRN: [id number] DOB: Apr 03, 1956 Today's Date: 07/30/2017 Time:  -       Pt passed stroke swallow screen. Any reason he is NPO?    Jeff [name].Ed CCC-SLP Pager [phone]     Jeff [name] 07/30/2017, 8:49 AM

## 2017-07-30 NOTE — Evaluation (Addendum)
Physical Therapy Evaluation Patient Details Name: Jeff [name] MRN: [id number] DOB: 1956/06/14 Today's Date: 07/30/2017   History of Present Illness  Jovahn Breit is a 61 y.o. male with medical history significant of Stroke, Mechanical aortic and mitral valve replacements, HTN, HLD.  Patient was apparently taken off of coumadin x3 weeks ago and switched to Xarelto he reports. Patient presented to the ED with c/o slurred speech onset with initial profound aphasia with word salad speech though this has improved significantly since arrival. TPA not used due to Xarelto a few hours PTA.  No large vessel occlusion on CTA head and neck. MRI Subtle 12 mm focus of diffusion abnormality within the subcortical white matter of the left frontoparietal region, suspicious for possible acute and/or early subacute white matter ischemia. No associated hemorrhage or mass effect  Clinical Impression  Pt doing well with mobility and no further PT needed.  Pt at baseline for mobility. Pt used to have personal care worker at home which he now states has been discontinued. He would like to resume PCS if possible.      Follow Up Recommendations No PT follow up    Equipment Recommendations  None recommended by PT    Recommendations for Other Services       Precautions / Restrictions Precautions Precautions: None Restrictions Weight Bearing Restrictions: No      Mobility  Bed Mobility Overal bed mobility: Independent                Transfers Overall transfer level: Modified independent                  Ambulation/Gait Ambulation/Gait assistance: Modified independent (Device/Increase time) Ambulation Distance (Feet): 300 Feet Assistive device: Straight cane Gait Pattern/deviations: Step-through pattern;Decreased stride length Gait velocity: decr Gait velocity interpretation: Below normal speed for age/gender General Gait Details: Deliberate gait with cane  Stairs             Wheelchair Mobility    Modified Rankin (Stroke Patients Only) Modified Rankin (Stroke Patients Only) Pre-Morbid Rankin Score: Slight disability Modified Rankin: Slight disability     Balance Overall balance assessment: Needs assistance Sitting-balance support: No upper extremity supported;Feet supported Sitting balance-Leahy Scale: Normal     Standing balance support: No upper extremity supported Standing balance-Leahy Scale: Good                               Pertinent Vitals/Pain Pain Assessment: No/denies pain    Home Living Family/patient expects to be discharged to:: Private residence Living Arrangements: Alone Available Help at Discharge: Friend(s) Type of Home: Apartment Home Access: Stairs to enter Entrance Stairs-Rails: None Entrance Stairs-Number of Steps: 1 Home Layout: One level Home Equipment: Cane - single point;Shower seat;Grab bars - toilet;Grab bars - tub/shower      Prior Function Level of Independence: Independent with assistive device(s)         Comments: amb with cane     Hand Dominance   Dominant Hand: Right    Extremity/Trunk Assessment   Upper Extremity Assessment Upper Extremity Assessment: Defer to OT evaluation    Lower Extremity Assessment Lower Extremity Assessment: LLE deficits/detail LLE Deficits / Details: slight weakness due to prior stroke       Communication      Cognition Arousal/Alertness: Awake/alert Behavior During Therapy: WFL for tasks assessed/performed Overall Cognitive Status: Within Functional Limits for tasks assessed  General Comments      Exercises     Assessment/Plan    PT Assessment Patent does not need any further PT services  PT Problem List         PT Treatment Interventions      PT Goals (Current goals can be found in the Care Plan section)  Acute Rehab PT Goals PT Goal Formulation: All assessment and education  complete, DC therapy    Frequency     Barriers to discharge        Co-evaluation               AM-PAC PT "6 Clicks" Daily Activity  Outcome Measure Difficulty turning over in bed (including adjusting bedclothes, sheets and blankets)?: None Difficulty moving from lying on back to sitting on the side of the bed? : None Difficulty sitting down on and standing up from a chair with arms (e.g., wheelchair, bedside commode, etc,.)?: A Little Help needed moving to and from a bed to chair (including a wheelchair)?: None Help needed walking in hospital room?: None Help needed climbing 3-5 steps with a railing? : A Little 6 Click Score: 22    End of Session   Activity Tolerance: Patient tolerated treatment well Patient left: in bed;with call bell/phone within reach Nurse Communication: Mobility status PT Visit Diagnosis: Unsteadiness on feet (R26.81)    Time: 2620-3559 PT Time Calculation (min) (ACUTE ONLY): 10 min   Charges:   PT Evaluation $PT Eval Low Complexity: 1 Low     PT G CodesMarland Kitchen        [facility] PT [place] 07/30/2017, 11:42 AM

## 2017-07-30 NOTE — ED Notes (Signed)
Patient transported to MRI 

## 2017-07-30 NOTE — Evaluation (Signed)
Speech Language Pathology Evaluation Patient Details Name: Jeff [name] MRN: [id number] DOB: 1956/03/30 Today's Date: 07/30/2017 Time: 7829-5621 SLP Time Calculation (min) (ACUTE ONLY): 12 min  Problem List:  Patient Active Problem List   Diagnosis Date Noted  . Acute embolic stroke ([place]) 30/86/5784  . Hyperlipidemia 02/28/2017  . Hypertension 02/28/2017  . Smoking 02/28/2017  . TIA (transient ischemic attack)   . Slurred speech   . Acute ischemic stroke ([place]) 03/19/2015  . Supratherapeutic INR 09/07/2014  . CVA (cerebral infarction) 07/16/2013  . Cerebellar stroke, acute ([place]) 07/13/2013  . Ataxia 07/12/2013  . Vomiting 07/10/2013  . Nausea with vomiting 07/10/2013  . Subtherapeutic international normalized ratio (INR) 07/10/2013  . AKI (acute kidney injury) ([place]) 07/10/2013  . Volume depletion 07/10/2013  . Nausea & vomiting 07/10/2013  . Cellulitis of left upper arm and forearm 04/04/2012  . History of CVA (cerebrovascular accident)   . Hx of aortic valve replacement, mechanical   . H/O mitral valve replacement with mechanical valve   . HX: long term anticoagulant use    Past Medical History:  Past Medical History:  Diagnosis Date  . H/O mitral valve replacement with mechanical valve   . Heart disease   . High cholesterol   . History of CVA (cerebrovascular accident)    02/2011  . Hx of aortic valve replacement, mechanical   . HX: long term anticoagulant use   . Stroke [facility])    Past Surgical History:  Past Surgical History:  Procedure Laterality Date  . CARDIAC VALVE REPLACEMENT     HPI:  Jeff [name] a 61 y.o.malewith medical history significant ofStroke, Mechanical aortic and mitral valve replacements, HTN, HLD. Patient was apparently taken off of coumadin x3 weeks ago and switched to Xarelto he reports. Patient presented to the ED with c/o slurred speech onset with initial profound aphasia with word salad speech though this has improved significantly since  arrival. TPA not used due to Xarelto a few hours PTA. No large vessel occlusion on CTA head and neck. MRI Subtle 12 mm focus of diffusion abnormality within the subcortical white matter of the left frontoparietal region, suspicious for possible acute and/or early subacute white matter ischemia. No associated hemorrhage or mass effect.   Assessment / Plan / Recommendation Clinical Impression  Pt demonstrated appropriate executive functions (independently ordering lunch, calling MD to notify of hospital admission). Scored within average range on memory, verbal problem solving, language and orientation subtest on Cognistat. No aphasia exhibited. No further ST needed at this time.      SLP Assessment  SLP Recommendation/Assessment: Patient does not need any further Speech Lanaguage Pathology Services SLP Visit Diagnosis: Cognitive communication deficit (R41.841)    Follow Up Recommendations  None    Frequency and Duration           SLP Evaluation Cognition  Overall Cognitive Status: Within Functional Limits for tasks assessed Arousal/Alertness: Awake/alert Orientation Level: Oriented X4 Attention: Sustained Sustained Attention: Appears intact Memory: Appears intact Awareness: Appears intact Problem Solving: Appears intact Safety/Judgment: Appears intact       Comprehension  Auditory Comprehension Overall Auditory Comprehension: Appears within functional limits for tasks assessed Commands: Within Functional Limits Visual Recognition/Discrimination Discrimination: Not tested Reading Comprehension Reading Status: Not tested    Expression Expression Primary Mode of Expression: Verbal Verbal Expression Overall Verbal Expression: Appears within functional limits for tasks assessed Initiation: No impairment Level of Generative/Spontaneous Verbalization: Conversation Repetition: No impairment Naming: No impairment Pragmatics: No impairment Written Expression Dominant Hand:  Right Written Expression: Not tested   Oral / Motor  Oral Motor/Sensory Function Overall Oral Motor/Sensory Function: Within functional limits Motor Speech Overall Motor Speech: Appears within functional limits for tasks assessed Respiration: Within functional limits Phonation: Normal Resonance: Within functional limits Articulation: Within functional limitis Intelligibility: Intelligible Motor Planning: Witnin functional limits   GO                    Houston Siren 07/30/2017, 10:47 AM  Orbie Pyo Colvin Caroli.Ed [employer] [phone]

## 2017-07-31 ENCOUNTER — Encounter (HOSPITAL_COMMUNITY): Payer: Self-pay | Admitting: Internal Medicine

## 2017-07-31 DIAGNOSIS — I6389 Other cerebral infarction: Secondary | ICD-10-CM | POA: Diagnosis not present

## 2017-07-31 DIAGNOSIS — I10 Essential (primary) hypertension: Secondary | ICD-10-CM | POA: Diagnosis not present

## 2017-07-31 DIAGNOSIS — I639 Cerebral infarction, unspecified: Secondary | ICD-10-CM | POA: Diagnosis present

## 2017-07-31 DIAGNOSIS — E785 Hyperlipidemia, unspecified: Secondary | ICD-10-CM | POA: Diagnosis not present

## 2017-07-31 DIAGNOSIS — Z952 Presence of prosthetic heart valve: Secondary | ICD-10-CM | POA: Diagnosis not present

## 2017-07-31 LAB — BASIC METABOLIC PANEL
Anion gap: 5 (ref 5–15)
BUN: 13 mg/dL (ref 6–20)
CALCIUM: 9.2 mg/dL (ref 8.9–10.3)
CO2: 26 mmol/L (ref 22–32)
CREATININE: 1.07 mg/dL (ref 0.61–1.24)
Chloride: 106 mmol/L (ref 101–111)
GFR calc Af Amer: >60 mL/min (ref >60)
GFR calc non Af Amer: >60 mL/min (ref >60)
GLUCOSE: 123 mg/dL — AB (ref 65–99)
Potassium: 4.4 mmol/L (ref 3.5–5.1)
Sodium: 137 mmol/L (ref 135–145)

## 2017-07-31 LAB — CBC
HCT: 37 % — ABNORMAL LOW (ref 39.0–52.0)
Hemoglobin: 12.5 g/dL — ABNORMAL LOW (ref 13.0–17.0)
MCH: 31.3 pg (ref 26.0–34.0)
MCHC: 33.8 g/dL (ref 30.0–36.0)
MCV: 92.7 fL (ref 78.0–100.0)
PLATELETS: 267 10*3/uL (ref 150–400)
RBC: 3.99 MIL/uL — ABNORMAL LOW (ref 4.22–5.81)
RDW: 13.3 % (ref 11.5–15.5)
WBC: 5 10*3/uL (ref 4.0–10.5)

## 2017-07-31 LAB — HEMOGLOBIN A1C
HEMOGLOBIN A1C: 5.6 % (ref 4.8–5.6)
MEAN PLASMA GLUCOSE: 114 mg/dL

## 2017-07-31 MED ORDER — HYDROCHLOROTHIAZIDE 25 MG PO TABS: 25.0000 mg | ORAL_TABLET | Freq: Every day | ORAL | 1 refills | Status: DC

## 2017-07-31 MED ORDER — ASPIRIN 81 MG PO TBEC: 81.0000 mg | DELAYED_RELEASE_TABLET | Freq: Every day | ORAL | Status: AC

## 2017-07-31 MED ORDER — AMLODIPINE BESYLATE 5 MG PO TABS: 5.0000 mg | ORAL_TABLET | Freq: Every day | ORAL | 0 refills | Status: DC

## 2017-07-31 MED ORDER — LISINOPRIL 20 MG PO TABS: 20.0000 mg | ORAL_TABLET | Freq: Every day | ORAL | 0 refills | Status: DC

## 2017-07-31 NOTE — Care Management Obs Status (Signed)
[place] NOTIFICATION   Patient Details  Name: Daud Cayer MRN: [id number] Date of Birth: Nov 25, 1955   Medicare Observation Status Notification Given:  Yes    Maryclare Labrador, RN 07/31/2017, 11:23 AM

## 2017-07-31 NOTE — Care Management CC44 (Signed)
Condition Code 44 Documentation Completed  Patient Details  Name: Jeff [name] MRN: [id number] Date of Birth: June 28, 1956   Condition Code 44 given:  Yes Patient signature on Condition Code 44 notice:  Yes Documentation of 2 MD's agreement:  Yes Code 44 added to claim:  Yes    Maryclare Labrador, RN 07/31/2017, 11:23 AM

## 2017-07-31 NOTE — Discharge Summary (Signed)
DISCHARGE SUMMARY  Jeff [name]  MR#: [id number]  DOB:27-Jan-1956  Date of Admission: 07/29/2017 Date of Discharge: 07/31/2017  Attending Physician:Jeffrey T Thereasa Solo  Patient's QIW:[id number], Milford Cage, NP  Consults:  [facility] Cardiology   Disposition: D/C home   Follow-up Appts: Follow-up Information    Garvin Fila, MD. Schedule an appointment as soon as possible for a visit in 6 week(s).   Specialties:  Neurology, Radiology Contact information: [address] [place] [address] [phone]          Discharge Diagnoses: Acute ischemic stroke - small vessel related  HLD HTN S/P mechanical aortic and mitral valves Hypokalemia  Initial presentation: 61 y.o.malewith a history of multiple priorstrokes mechanical aortic and mitral valve replacements, HTN, and HLD. Patient was reportedly taken off of coumadin and switched to Xarelto 3 weeks prior to this admit.    Patient presented to the ED with the acute onset of slurred speech.  He initially had profound aphasia with word salad speech though this improved after arrival. TPA was not used due to Xarelto a few hours PTA. No large vessel occlusion noted on CTA head and neck.  Hospital Course:  Acute ischemic stroke Felt to have been most c/w a small vessel phenomenon - Neurology completed w/u and has suggested adding ASA to his tx plan - evaluated by PT/OT/SLP w/ no signif persisting deficits - pt has been advised on risk factor modification to include smoking cessation   HLD Resume usual outpt tx regimen  HTN BP controlled well during hospital stay, even w/o attempts at strict control - resume usual home meds 24hrs post d/c    S/P mechanical aortic and mitral valves Has been repeatedly noncompliant w/ coumadin dosing, suffering 3 suspected embolic CVAs while "on coumding" with subtherapeutic INRs - as a result, he was place on Xarelto in hopes of improving his compliance, recognizing that Xarelto  does not carry an indication for anticoag of mechanical valves - in that this CVA was felt to be most c/w a small vessel phenomenon and not embolic, Neuro has suggested resuming Xarelto, and also adding ASA to his tx plan - the pt has been counseled on the absolute need to comply religiously w/ his medication tx   Hypokalemia Corrected w/ supplementation   Allergies as of 07/31/2017   No Known Allergies     Medication List    STOP taking these medications   warfarin 10 MG tablet Commonly known as:  COUMADIN     TAKE these medications   amLODipine 5 MG tablet Commonly known as:  NORVASC Take 1 tablet (5 mg total) by mouth daily with supper. Start taking on:  08/02/2017 What changed:  These instructions start on 08/02/2017. If you are unsure what to do until then, ask your doctor or other care provider.   aspirin 81 MG EC tablet Take 1 tablet (81 mg total) by mouth daily.   hydrochlorothiazide 25 MG tablet Commonly known as:  HYDRODIURIL Take 1 tablet (25 mg total) by mouth daily with supper. Start taking on:  08/02/2017 What changed:  These instructions start on 08/02/2017. If you are unsure what to do until then, ask your doctor or other care provider.   lisinopril 20 MG tablet Commonly known as:  PRINIVIL,ZESTRIL Take 1 tablet (20 mg total) by mouth daily with supper. Start taking on:  08/02/2017 What changed:  These instructions start on 08/02/2017. If you are unsure what to do until then, ask your doctor or other care  provider.   pravastatin 20 MG tablet Commonly known as:  PRAVACHOL Take 20 mg by mouth daily with supper.   rivaroxaban 20 MG Tabs tablet Commonly known as:  XARELTO Take 20 mg by mouth daily with supper.   tadalafil 5 MG tablet Commonly known as:  CIALIS Take 5 mg by mouth daily as needed for erectile dysfunction.       Day of Discharge BP (!) 132/92 (BP Location: Right Arm)   Pulse 82   Temp 98.3 F (36.8 C) (Oral)   Resp 16   Ht 6'  (1.829 m)   Wt 77 kg (169 lb 12.1 oz)   SpO2 100%   BMI 23.02 kg/m   Physical Exam: General: No acute respiratory distress Lungs: Clear to auscultation bilaterally without wheezes or crackles Cardiovascular: Regular rate and rhythm without murmur gallop or rub normal S1 and S2 Abdomen: Nontender, nondistended, soft, bowel sounds positive, no rebound, no ascites, no appreciable mass Extremities: No significant cyanosis, clubbing, or edema bilateral lower extremities  Basic Metabolic Panel: Recent Labs  Lab 07/29/17 2305 07/29/17 2319 07/31/17 0231  NA 133* 138 137  K 3.2* 3.2* 4.4  CL 101 100* 106  CO2 20*  --  26  GLUCOSE 133* 138* 123*  BUN 9 9 13   CREATININE 1.09 1.10 1.07  CALCIUM 9.2  --  9.2    Liver Function Tests: Recent Labs  Lab 07/29/17 2305  AST 23  ALT 17  ALKPHOS 102  BILITOT 0.6  PROT 7.1  ALBUMIN 4.2    Coags: Recent Labs  Lab 07/29/17 2305  INR 1.48   CBC: Recent Labs  Lab 07/29/17 2305 07/29/17 2319 07/31/17 0231  WBC 5.7  --  5.0  NEUTROABS 4.1  --   --   HGB 12.3* 13.3 12.5*  HCT 36.3* 39.0 37.0*  MCV 92.8  --  92.7  PLT 255  --  267    Recent Results (from the past 240 hour(s))  MRSA PCR Screening     Status: None   Collection Time: 07/30/17  9:29 AM  Result Value Ref Range Status   MRSA by PCR NEGATIVE NEGATIVE Final    Comment:        The GeneXpert MRSA Assay (FDA approved for NASAL specimens only), is one component of a comprehensive MRSA colonization surveillance program. It is not intended to diagnose MRSA infection nor to guide or monitor treatment for MRSA infections.      Time spent in discharge (includes decision making & examination of pt): 30 minutes  07/31/2017, 10:14 AM   Cherene Altes, MD [facility] Office  [phone] Pager [phone]  On-Call/Text Page:      [email or web address]      password [facility]

## 2017-07-31 NOTE — Care Management Note (Signed)
Case Management Note  Patient Details  Name: Jeff [name] MRN: [id number] Date of Birth: 11-May-1956  Subjective/Objective:    Pt admitted with acute ischemic stroke                Action/Plan:   PTA independent from home.  Pt will transport home via private vehicle.  Pt has PCP and denies barriers to obtaining/paying for medications.    Expected Discharge Date:  07/31/17               Expected Discharge Plan:  Home/Self Care  In-House Referral:     Discharge planning Services  CM Consult  Post Acute Care Choice:    Choice offered to:     DME Arranged:    DME Agency:     HH Arranged:    HH Agency:     Status of Service:     If discussed at [employer], dates discussed:    Additional Comments:  Maryclare Labrador, RN 07/31/2017, 10:29 AM

## 2017-07-31 NOTE — Progress Notes (Addendum)
Explained and discussed discharge instructions. Pt will be going home with daugther with belongings once she gets here. Follow up appt made and given to pt. Pt have no prescriptions just instructions to restart home meds. No voice complaints at this time

## 2017-07-31 NOTE — Progress Notes (Addendum)
   07/30/17 1145  PT G-Codes **NOT FOR INPATIENT CLASS**  Functional Assessment Tool Used AM-PAC 6 Clicks Basic Mobility  Functional Limitation Mobility: Walking and moving around  Mobility: Walking and Moving Around Current Status (Z0258) CJ  Mobility: Walking and Moving Around Goal Status (N2778) CJ  Mobility: Walking and Moving Around Discharge Status [phone]) Layla Maw [place] PT [phone]

## 2017-07-31 NOTE — Plan of Care (Signed)
Continue current care plan 

## 2017-08-02 NOTE — Progress Notes (Signed)
SLP addendum   08/02/17 0800  SLP G-Codes **NOT FOR INPATIENT CLASS**  Functional Assessment Tool Used skilled clinical observations  Functional Limitations Spoken language expressive  Spoken Language Expression Current Status [phone]) [place]  Spoken Language Expression Goal Status [phone]) [facility]  Spoken Language Expression Discharge Status [phone]) [place]   Orbie Pyo [place].Ed [employer] [phone]

## 2017-08-10 ENCOUNTER — Other Ambulatory Visit: Payer: Self-pay | Admitting: *Deleted

## 2017-08-10 NOTE — Patient Outreach (Signed)
[facility]) Care Management  08/10/2017  Jeff [name] 25-Apr-1956 [id number]  Referral via EMMI=Stroke Red Alert-Day#9 08/09/2017;  Reason: Feeling worse overall-yes Questions/problems with medications?-yes   Per history review: Recent hospital stay 12/16-12/18/2018 Dx:  Ischemic Stroke-small vessel related; s/p mechanical Aortic & Mitral Valves  Call #1 to patient who was advised of reason for call.  HIPPA verification received from patient. Patient voices that he recalls electronic calls & states he answered "no" to feeling worse overall & " no" to questions/problems with medications.  States he manages own medications & takes as instructed by MD.  Patient voices understanding of importance of taking medications as prescribed. States he saw cardiologist 08/09/2017 and his blood thinner medication was changed to coumadin. States he is scheduled to get blood work weekly for coumadin regulation. Also states he will see MD once every 10 days.  States he will also contact primary care provider to see if they need to see him since recent hospital stay.  Voices that he already has appointment with stroke MD scheduled.  States no problems with transportation . Voices he lives alone but has support from his daughter who checks on him daily,   Patient states he is very aware of stroke symptoms & would call 911 or have neighbor call if needed.   Patient states he has no concerns currently. EMMI- call completed.  Plan: Send to care management assistant to close case.   Sherrin Daisy, RN BSN [place] Management Coordinator [facility] Care Management  [phone]   EMMI

## 2017-09-27 ENCOUNTER — Ambulatory Visit (INDEPENDENT_AMBULATORY_CARE_PROVIDER_SITE_OTHER): Payer: Medicare Other | Admitting: Neurology

## 2017-09-27 ENCOUNTER — Encounter: Payer: Self-pay | Admitting: Neurology

## 2017-09-27 VITALS — BP 99/72 | HR 101 | Wt 163.8 lb

## 2017-09-27 DIAGNOSIS — Z952 Presence of prosthetic heart valve: Secondary | ICD-10-CM

## 2017-09-27 DIAGNOSIS — E785 Hyperlipidemia, unspecified: Secondary | ICD-10-CM | POA: Diagnosis not present

## 2017-09-27 DIAGNOSIS — I63449 Cerebral infarction due to embolism of unspecified cerebellar artery: Secondary | ICD-10-CM | POA: Diagnosis not present

## 2017-09-27 NOTE — Patient Instructions (Signed)
I had a long d/w patient about his recent stroke, risk for recurrent stroke/TIAs, personally independently reviewed imaging studies and stroke evaluation results and answered questions.Continue aspirin 81 mg daily and warfarin daily  for secondary stroke prevention and maintain strict control of hypertension with blood pressure goal below 130/90, diabetes with hemoglobin A1c goal below 6.5% and lipids with LDL cholesterol goal below 70 mg/dL. I also advised the patient to eat a healthy diet with plenty of whole grains, cereals, fruits and vegetables, exercise regularly and maintain ideal body weight Followup in the future with Janett Billow, NP in 6 months -continue to follow up with your cardiologist for INR level checks and warfarin and aspirin -Important for you to quit smoking tobacco and THC

## 2017-09-27 NOTE — Progress Notes (Signed)
[facility]  PATIENT: Jeff [name] DOB: [birth year]   REASON FOR VISIT:  Follow up for stroke in 2016 HISTORY FROM:  patient   HISTORY OF PRESENT ILLNESS:Mr Jeff [name] is a 66 year African-American male seen today for follow-up for first office visit following hospital admission for stroke in August 2016.Jeff [name] is a 62 y.o. male who reported that he was walking to the ATM on 03/19/15 morning and lost control of his cane that he usually holds in his right hand. When he attempted to pick it up he was unable to because he couldn't control his right hand. He went to the store and was unable to open his wallet again because of his right hand. When he attempted to speak his speech was slurred. The patient presented at that time. Symptoms have reoslved. Patient on Coumadin due to aortic and mitral valve replacement but INR is normal today. CT scan of the head showed no acute infarct but showed old bilateral cerebral and cerebellar hemispheric infarcts. MRI scan showed an acute small left middle cerebellar peduncle infarct and multiple old bilateral strokes. Transthoracic echo showed ejection fraction of 45-40% with a mechanical heart valve. Carotid Doppler showed no significant extracranial stenosis. LDL cholesterol was elevated at 115 mg percent. Hemoglobin A1c was 5.4. Patient's anti-coagulation was suboptimal and he was started on warfarin and has done well since discharge. He states his gait and balance remained poor but this is pre-existing. His right hand weakness has improved. His blood pressure is well controlled and today it is 110/70. He states his INR do fluctuate a little bit but is tolerating warfarin well without bleeding or bruising. He has his blood checked every week at family medicine clinic. Is tolerating Pravachol well without side effects. His tongue to quit smoking and has cut down to 1-2 cigarettes per day and is on nicotine patch. Update 02/29/2016 PS: He returns  for follow-up after last visit in October 2016. Continues to do well from neurovascular standpoint without recurrent stroke or TIA symptoms. He remains on warfarin which is tolerating well without bleeding or bruising and states his INR has been fairly steady. He however has fallen twice on both occasions he was trying to get up from a chair and sit in the couch and he slipped. Fortunately did not have any significant bleeding bruising or injury. He states his blood pressure is well controlled and today it is 111/80 in office. He remains on Pravachol which is tolerating well without muscle aches or pains. He plans to have lipid profile and lab work soon with primary physician. He continues to smoke 1-2 cigarettes per day and is not willing to quit. Patient is to have a cane coming in daily to help him clean and cook and would like for that to be continued. I advised him to discuss this with his primary physician. He still has  mild stiffness and incoordination in his left leg and he states he does use his cane most of the time. UPDATE  07/18/2018[name] , 62 year old male returns for follow-up  History of stroke in October 2016.  He continues to do well from a neurovascular standpoint.He remains on Warfarin without bleeding or bruising. His INR has been stable and is followed by family medicine. He remains on Pravachol without complaints of myalgias. He denies any falls. Ambulates with single-point cane . Blood pressure the office today at 106/75. He continues to smoke 2 cigarettes a day. Denies alcohol. Smokes marijuana. He returns for  reevaluation UPDATE 09/27/17: [name] is a 62 y.o. male with PMH of mechanical mitral valve and mechanical aortic valve- not indicated for mechanical valves -he is seen today for first office follow-up visit for hospital admission for stroke in December 2018. He developed sudden onset of aphasia and speech difficulties and was brought to [facility] where blood  pressure was slightly elevated. It was unclear whether he was on Coumadin on Xarelto and hence a TPA was not given. MRI scan showed a tiny punctate left frontal white matter infarct. After discussion with his cardiologist it was clarified that the patient should be on Coumadin due to his mechanical heart valve and not on Xarelto and hence Coumadin was resumed. . Noncontrast CT of the head  reviewed and showed no acute changes, might have a possible dense left MCA. CT angiogram of the head and neck reviewed and showed no large vessel occlusion that might have been amenable to endovascular intervention. CT perfusion study showed a small area of penumbra in the left parietal area with no core which might reflect a new area of ischemia and also a left frontal area of core only possibly reflecting the old infarcts from prior stroke. TPA is contraindicated. No large vessel occlusion for endovascular procedure. MRI reviewed and showed subtle 12 mm focus of diffusion abnormality within the subcortical white matter of the left frontoparietal region, suspicious for possible acute and/or early subacute white matter ischemia and multiple remote bilateral cerebral and cerebellar infarcts which are stable.  2D echo showed an EF of 50-55% and no cardiac source of emboli was identified.  Upon discharge, expressive aphasia resolved and recommended to restart Xarelto due to patient compliance on this medication and add an aspirin 81 mg daily.  Also, increased over the child to 40 mg daily.  Patient discharged home in stable condition.                 Since discharge from the hospital in December 2018, patient was placed back on Coumadin by his cardiologist Dr. Einar Gip who has also been checking INR levels.  Patient states he has been compliant with Coumadin and previous INR check 2 weeks ago and was at 2.9.  Patient will have a recheck tomorrow.  Patient continues to take aspirin 81 mg.  Denies side effects.of increased bleeding or  bruising.  Patient continues to take pravastatin and denies side effects.  Blood pressure today 99/72 patient states this is low for him.  Patient checks blood pressure at home and SBP ranges 110-120s.  Patient continues to smoke cigarettes and marijuana.  He states this is infrequent as he cannot always afford cigarettes and marijuana.  Patient states he has been trying to quit smoking cigarettes.  Patient denies new or worsening stroke/TIA symptoms since discharge.   REVIEW OF SYSTEMS: Full 14 system review of systems performed and notable only for those listed, all others are neg: eye discharge and cough    ALLERGIES: No Known Allergies  HOME MEDICATIONS: Outpatient Medications Prior to Visit  Medication Sig Dispense Refill  . amLODipine (NORVASC) 5 MG tablet Take 1 tablet (5 mg total) by mouth daily with supper.  0  . aspirin EC 81 MG EC tablet Take 1 tablet (81 mg total) by mouth daily.    Marland Kitchen lisinopril (PRINIVIL,ZESTRIL) 20 MG tablet Take 1 tablet (20 mg total) by mouth daily with supper.  0  . pravastatin (PRAVACHOL) 20 MG tablet Take 20 mg by mouth daily with supper.  0  . tadalafil (CIALIS) 5 MG tablet Take 5 mg by mouth daily as needed for erectile dysfunction.   0  . warfarin (COUMADIN) 10 MG tablet Take 10 mg by mouth daily.    Marland Kitchen warfarin (COUMADIN) 7.5 MG tablet Take 7.5 mg by mouth daily.    . hydrochlorothiazide (HYDRODIURIL) 25 MG tablet Take 1 tablet (25 mg total) by mouth daily with supper. (Patient not taking: Reported on 09/27/2017)  1  . rivaroxaban (XARELTO) 20 MG TABS tablet Take 20 mg by mouth daily with supper.    . warfarin (COUMADIN) 10 MG tablet take 10 milligram by mouth ON MONDAY,WEDNESDAY AND FRIDAY. TAKE 7.5 MG ALL OTHER DAYS  0   No facility-administered medications prior to visit.     PAST MEDICAL HISTORY: Past Medical History:  Diagnosis Date  . Acute ischemic stroke ([place])   . H/O mitral valve replacement with mechanical valve   . Heart disease   .  High cholesterol   . History of CVA (cerebrovascular accident)    02/2011  . Hx of aortic valve replacement, mechanical   . HX: long term anticoagulant use   . Stroke [facility])     PAST SURGICAL HISTORY: Past Surgical History:  Procedure Laterality Date  . CARDIAC VALVE REPLACEMENT      FAMILY HISTORY: Family History  Problem Relation Age of Onset  . Diabetes Mother        Died before her 52 birthday  . Heart attack Brother     SOCIAL HISTORY: Social History   Socioeconomic History  . Marital status: Single    Spouse name: Not on file  . Number of children: Not on file  . Years of education: Not on file  . Highest education level: Not on file  Social Needs  . Financial resource strain: Not on file  . Food insecurity - worry: Not on file  . Food insecurity - inability: Not on file  . Transportation needs - medical: Not on file  . Transportation needs - non-medical: Not on file  Occupational History  . Not on file  Tobacco Use  . Smoking status: Current Every Day Smoker    Packs/day: 0.05    Types: Cigarettes  . Smokeless tobacco: Never Used  . Tobacco comment: smoke two per day  Substance and Sexual Activity  . Alcohol use: Yes    Comment: wine coolers prn  . Drug use: Yes    Frequency: 7.0 times per week    Types: Marijuana    Comment: smoke when he can get it  . Sexual activity: Yes  Other Topics Concern  . Not on file  Social History Narrative  . Not on file     PHYSICAL EXAM  Vitals:   09/27/17 1049  BP: 99/72  Pulse: (!) 101  Weight: 163 lb 12.8 oz (74.3 kg)   Body mass index is 22.22 kg/m.  Generalized: Well developed, elderly African American male,  in no acute distress  Head: normocephalic and atraumatic Neck: Supple, no carotid bruits  Cardiac: Regular rate rhythm, no murmur, mechanical click present Musculoskeletal: No deformity   Neurological examination   Mentation: Alert oriented to time, place, history taking. Attention span and  concentration appropriate. Recent and remote memory intact.  Follows all commands speech and language fluent.  Cranial nerve II-XII: Pupils were equal round reactive to light extraocular movements were full, visual field were full on confrontational test. Facial sensation and strength were normal. hearing was intact to finger  rubbing bilaterally. Uvula tongue midline. head turning and shoulder shrug were normal and symmetric.Tongue protrusion into cheek strength was normal. Motor: normal bulk and tone, full strength in the BUE, BLE, fine finger movements normal, no pronator drift. No focal weakness Sensory: normal and symmetric to light touch, on the face arms and legs  Coordination: finger-nose-finger, heel-to-shin bilaterally, no dysmetria, no tremor Reflexes: 2+ bilaterally and symmetric  Gait and Station: Rising up from seated position without assistance, normal stance,  moderate stride with cane, good arm swing, smooth turning, able to perform tiptoe, and heel walking without difficulty. Tandem gait is unsteady  DIAGNOSTIC DATA (LABS, IMAGING, TESTING) - I reviewed patient records, labs, notes, testing and imaging myself where available.  Lab Results  Component Value Date   WBC 5.0 07/31/2017   HGB 12.5 (L) 07/31/2017   HCT 37.0 (L) 07/31/2017   MCV 92.7 07/31/2017   PLT 267 07/31/2017      Component Value Date/Time   NA 137 07/31/2017 0231   K 4.4 07/31/2017 0231   CL 106 07/31/2017 0231   CO2 26 07/31/2017 0231   GLUCOSE 123 (H) 07/31/2017 0231   BUN 13 07/31/2017 0231   CREATININE 1.07 07/31/2017 0231   CALCIUM 9.2 07/31/2017 0231   PROT 7.1 07/29/2017 2305   ALBUMIN 4.2 07/29/2017 2305   AST 23 07/29/2017 2305   ALT 17 07/29/2017 2305   ALKPHOS 102 07/29/2017 2305   BILITOT 0.6 07/29/2017 2305   GFRNONAA >60 07/31/2017 0231   GFRAA >60 07/31/2017 0231   Lab Results  Component Value Date   CHOL 163 07/30/2017   HDL 49 07/30/2017   LDLCALC 104 (H) 07/30/2017   TRIG  50 07/30/2017   CHOLHDL 3.3 07/30/2017   Lab Results  Component Value Date   HGBA1C 5.6 07/30/2017    Ct Angio Head and neck and perfusion W Or Wo Contrast 07/30/2017 IMPRESSION: 1. Negative CTA for emergent large vessel occlusion. 2. Perfusion defect at the posterior left frontal parietal region, suspicious for acute ischemia. Matched perfusion defect at the anterior left frontal lobe consistent with chronic infarction. 3. Atherosclerotic change involving the carotid bifurcations and intracranial vasculature as above. No high-grade or correctable stenosis. 4. **An incidental finding of potential clinical significance has been found. 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 Recommend annual imaging followup by CTA or MRA. This recommendation follows 2010 ACCF/AHA/AATS/ACR/ASA/SCA/SCAI/SIR/STS/SVM Guidelines for the Diagnosis and Management of Patients with Thoracic Aortic Disease. Circulation. 2010; 121: [id number]** Electronically Signed   By: Jeannine Boga M.D.   On: 07/30/2017 00:33   Mr Brain Wo Contrast 07/30/2017 IMPRESSION: 1. Subtle 12 mm focus of diffusion abnormality within the subcortical white matter of the left frontoparietal region, suspicious for possible acute and/or early subacute white matter ischemia. No associated hemorrhage or mass effect. 2. No other acute intracranial abnormality. 3. Multiple remote bilateral cerebral and cerebellar infarcts, stable. Electronically Signed   By: Jeannine Boga M.D.   On: 07/30/2017 04:31   Ct Head Code Stroke Wo Contrast 07/29/2017 IMPRESSION: 1. No acute intracranial infarct or other abnormality identified. 2. ASPECTS is 10. 3. Multiple remote bilateral cerebral and cerebellar infarcts, stable from previous. Critical Value/emergent results were called by telephone at the time of interpretation on 07/29/2017 at 11:33 pm to Dr. Rory Percy , who verbally acknowledged these results. Electronically Signed   By: Jeannine Boga M.D.   On: 07/29/2017 23:37   Echocardiogram:  Study Conclusions - Left ventricle: The cavity  size was normal. Wall thickness was increased in a pattern of mild LVH. Systolic function was normal. The estimated ejection fraction was in the range of 50% to 55%. Post op septal wall hypokinesis. Doppler parameters are consistent with abnormal left ventricular relaxation (grade 1 diastolic dysfunction). - Aortic valve: A mechanical prosthesis was present. The prosthesis had a normal range of motion. Peak velocity (S): 216 cm/s. Valve area (VTI): 1.35 cm^2. Valve area (Vmax): 1.39 cm^2. Valve area (Vmean): 1.43 cm^2. - Aorta: Ascending aortic diameter: 43 mm (S). - Ascending aorta: The ascending aorta was mildly dilated. - Mitral valve: A mechanical prosthesis was present. The prosthesis had a normal range of motion. - Left atrium: The atrium was moderately dilated. Impressions: - No cardiac source of emboli was indentified.    ASSESSMENT AND PLAN 36 year African-American male with small left cerebellar infarct in August 2016 secondary to cardioembolic embolism from mechanical heart valve with suboptimal anticoagulation.  On 07/29/2017, patient had a small left frontal parietal region infarct secondary to embolism from the heart because of mechanical valves and possibly suboptimal anticoagulation.  Multiple vascular risk factors of mechanical heart valve, history of stroke, CAD, polysubstance abuse, hypertension, hyperlipidemia and smoking.   I had a long d/w patient about his recent stroke, risk for recurrent stroke/TIAs, personally independently reviewed imaging studies and stroke evaluation results and answered questions.Continue aspirin 81 mg daily and warfarin daily  for secondary stroke prevention and maintain strict control of hypertension with blood pressure goal below 130/90, diabetes with hemoglobin A1c goal below 6.5% and lipids with LDL cholesterol goal  below 70 mg/dL. I also advised the patient to eat a healthy diet with plenty of whole grains, cereals, fruits and vegetables, exercise regularly and maintain ideal body weight Followup in the future with Janett Billow, NP in 6 months -cardiologist to follow up on aspirin, coumadin and INR checks -smoking cessation  Greater than 50% of this 25-minute visit was spent on counseling and coordination of care, reviewing test results, reviewing medications, discussing and reviewing the diagnosis of recent stroke and management of risk factors.  Time was also spent educating patient on recent stroke, medication compliance, use of Coumadin with mechanical heart valve, smoking cessation and discontinue the use of illicit drugs.  Antony Contras, MD  [facility] [address] [place], [place] [address]  Phone [phone] Fax [phone]

## 2017-11-07 ENCOUNTER — Inpatient Hospital Stay (HOSPITAL_COMMUNITY)
Admission: EM | Admit: 2017-11-07 | Discharge: 2017-11-16 | DRG: 981 | Disposition: A | Discharge status: Home / Self Care | Payer: Medicare Other | Attending: Internal Medicine | Admitting: Internal Medicine

## 2017-11-07 ENCOUNTER — Emergency Department (HOSPITAL_COMMUNITY): Payer: Medicare Other

## 2017-11-07 ENCOUNTER — Encounter (HOSPITAL_COMMUNITY): Payer: Self-pay | Admitting: Emergency Medicine

## 2017-11-07 DIAGNOSIS — F1290 Cannabis use, unspecified, uncomplicated: Secondary | ICD-10-CM | POA: Diagnosis present

## 2017-11-07 DIAGNOSIS — R791 Abnormal coagulation profile: Secondary | ICD-10-CM | POA: Diagnosis present

## 2017-11-07 DIAGNOSIS — K8689 Other specified diseases of pancreas: Secondary | ICD-10-CM | POA: Diagnosis present

## 2017-11-07 DIAGNOSIS — R970 Elevated carcinoembryonic antigen [CEA]: Secondary | ICD-10-CM | POA: Diagnosis present

## 2017-11-07 DIAGNOSIS — Z7982 Long term (current) use of aspirin: Secondary | ICD-10-CM

## 2017-11-07 DIAGNOSIS — I2699 Other pulmonary embolism without acute cor pulmonale: Secondary | ICD-10-CM | POA: Diagnosis present

## 2017-11-07 DIAGNOSIS — Z7901 Long term (current) use of anticoagulants: Secondary | ICD-10-CM

## 2017-11-07 DIAGNOSIS — I4891 Unspecified atrial fibrillation: Secondary | ICD-10-CM | POA: Diagnosis present

## 2017-11-07 DIAGNOSIS — C787 Secondary malignant neoplasm of liver and intrahepatic bile duct: Principal | ICD-10-CM | POA: Diagnosis present

## 2017-11-07 DIAGNOSIS — Z952 Presence of prosthetic heart valve: Secondary | ICD-10-CM

## 2017-11-07 DIAGNOSIS — I10 Essential (primary) hypertension: Secondary | ICD-10-CM | POA: Diagnosis present

## 2017-11-07 DIAGNOSIS — F17210 Nicotine dependence, cigarettes, uncomplicated: Secondary | ICD-10-CM | POA: Diagnosis present

## 2017-11-07 DIAGNOSIS — I82402 Acute embolism and thrombosis of unspecified deep veins of left lower extremity: Secondary | ICD-10-CM | POA: Diagnosis present

## 2017-11-07 DIAGNOSIS — C259 Malignant neoplasm of pancreas, unspecified: Secondary | ICD-10-CM

## 2017-11-07 DIAGNOSIS — R1013 Epigastric pain: Secondary | ICD-10-CM | POA: Diagnosis not present

## 2017-11-07 DIAGNOSIS — D62 Acute posthemorrhagic anemia: Secondary | ICD-10-CM | POA: Diagnosis present

## 2017-11-07 DIAGNOSIS — I82432 Acute embolism and thrombosis of left popliteal vein: Secondary | ICD-10-CM | POA: Diagnosis present

## 2017-11-07 DIAGNOSIS — R109 Unspecified abdominal pain: Secondary | ICD-10-CM | POA: Diagnosis present

## 2017-11-07 DIAGNOSIS — R7989 Other specified abnormal findings of blood chemistry: Secondary | ICD-10-CM | POA: Diagnosis present

## 2017-11-07 DIAGNOSIS — Z6821 Body mass index (BMI) 21.0-21.9, adult: Secondary | ICD-10-CM

## 2017-11-07 DIAGNOSIS — R739 Hyperglycemia, unspecified: Secondary | ICD-10-CM | POA: Diagnosis present

## 2017-11-07 DIAGNOSIS — Z8673 Personal history of transient ischemic attack (TIA), and cerebral infarction without residual deficits: Secondary | ICD-10-CM

## 2017-11-07 DIAGNOSIS — E7800 Pure hypercholesterolemia, unspecified: Secondary | ICD-10-CM | POA: Diagnosis present

## 2017-11-07 DIAGNOSIS — Z79899 Other long term (current) drug therapy: Secondary | ICD-10-CM

## 2017-11-07 DIAGNOSIS — E440 Moderate protein-calorie malnutrition: Secondary | ICD-10-CM

## 2017-11-07 DIAGNOSIS — M3211 Endocarditis in systemic lupus erythematosus: Secondary | ICD-10-CM | POA: Diagnosis present

## 2017-11-07 DIAGNOSIS — K625 Hemorrhage of anus and rectum: Secondary | ICD-10-CM | POA: Diagnosis present

## 2017-11-07 DIAGNOSIS — N179 Acute kidney failure, unspecified: Secondary | ICD-10-CM | POA: Diagnosis present

## 2017-11-07 DIAGNOSIS — F17200 Nicotine dependence, unspecified, uncomplicated: Secondary | ICD-10-CM | POA: Diagnosis present

## 2017-11-07 DIAGNOSIS — K921 Melena: Secondary | ICD-10-CM

## 2017-11-07 LAB — CBC WITH DIFFERENTIAL/PLATELET
BAND NEUTROPHILS: 0 %
BASOS ABS: 0 10*3/uL (ref 0.0–0.1)
BASOS PCT: 0 %
Blasts: 0 %
EOS ABS: 0 10*3/uL (ref 0.0–0.7)
EOS PCT: 0 %
HCT: 30.8 % — ABNORMAL LOW (ref 39.0–52.0)
Hemoglobin: 10.1 g/dL — ABNORMAL LOW (ref 13.0–17.0)
Lymphocytes Relative: 8 %
Lymphs Abs: 0.7 10*3/uL (ref 0.7–4.0)
MCH: 30.5 pg (ref 26.0–34.0)
MCHC: 32.8 g/dL (ref 30.0–36.0)
MCV: 93.1 fL (ref 78.0–100.0)
METAMYELOCYTES PCT: 0 %
MONO ABS: 0.6 10*3/uL (ref 0.1–1.0)
MONOS PCT: 7 %
MYELOCYTES: 0 %
NEUTROS ABS: 7.9 10*3/uL — AB (ref 1.7–7.7)
Neutrophils Relative %: 85 %
Other: 0 %
Platelets: 328 10*3/uL (ref 150–400)
Promyelocytes Absolute: 0 %
RBC: 3.31 MIL/uL — ABNORMAL LOW (ref 4.22–5.81)
RDW: 13.6 % (ref 11.5–15.5)
WBC: 9.2 10*3/uL (ref 4.0–10.5)
nRBC: 0 /100 WBC

## 2017-11-07 LAB — PROTIME-INR
INR: 1.74
Prothrombin Time: 20.2 seconds — ABNORMAL HIGH (ref 11.4–15.2)

## 2017-11-07 LAB — I-STAT CG4 LACTIC ACID, ED
LACTIC ACID, VENOUS: 2.46 mmol/L — AB (ref 0.5–1.9)
LACTIC ACID, VENOUS: 1.87 mmol/L (ref 0.5–1.9)

## 2017-11-07 LAB — LIPASE, BLOOD: Lipase: 33 U/L (ref 11–51)

## 2017-11-07 LAB — COMPREHENSIVE METABOLIC PANEL
ALBUMIN: 3.6 g/dL (ref 3.5–5.0)
ALT: 56 U/L (ref 17–63)
AST: 54 U/L — AB (ref 15–41)
Alkaline Phosphatase: 403 U/L — ABNORMAL HIGH (ref 38–126)
Anion gap: 12 (ref 5–15)
BUN: 16 mg/dL (ref 6–20)
CHLORIDE: 98 mmol/L — AB (ref 101–111)
CO2: 24 mmol/L (ref 22–32)
CREATININE: 1.55 mg/dL — AB (ref 0.61–1.24)
Calcium: 9 mg/dL (ref 8.9–10.3)
GFR calc Af Amer: 53 mL/min — ABNORMAL LOW (ref >60)
GFR calc non Af Amer: 46 mL/min — ABNORMAL LOW (ref >60)
GLUCOSE: 289 mg/dL — AB (ref 65–99)
POTASSIUM: 3.4 mmol/L — AB (ref 3.5–5.1)
Sodium: 134 mmol/L — ABNORMAL LOW (ref 135–145)
Total Bilirubin: 1 mg/dL (ref 0.3–1.2)
Total Protein: 6.7 g/dL (ref 6.5–8.1)

## 2017-11-07 LAB — POC OCCULT BLOOD, ED: Fecal Occult Bld: POSITIVE — AB

## 2017-11-07 MED ORDER — IOPAMIDOL (ISOVUE-300) INJECTION 61%
100.0000 mL | Freq: Once | INTRAVENOUS | Status: AC | PRN
  Administered 2017-11-07: 100 mL via INTRAVENOUS

## 2017-11-07 MED ORDER — IOPAMIDOL (ISOVUE-300) INJECTION 61%
INTRAVENOUS | Status: AC
  Filled 2017-11-07: qty 100

## 2017-11-07 NOTE — ED Triage Notes (Signed)
Pt reports gen abd pain X2-3 days with N/V. Tender with palpation, 8/10, sharp in nature. Pt hypotensive in triage.

## 2017-11-07 NOTE — ED Notes (Signed)
Patient transported to CT 

## 2017-11-07 NOTE — ED Provider Notes (Signed)
[facility] EMERGENCY DEPARTMENT Provider Note   CSN: [id number] Arrival date & time: 11/07/17  2045     History   Chief Complaint Chief Complaint  Patient presents with  . Abdominal Pain    HPI Jeff [name] is a 62 y.o. male.  The history is provided by the patient and medical records.    62 year old male with history of stroke, history of mitral and aortic valve replacement on Coumadin, hyperlipidemia, hypertension, TIA, presenting to the ED for abdominal pain.  Patient states he recently had diagnosed DVT in his left lower leg.  He was on Coumadin but his numbers were low so they bridged him with Lovenox.  States since starting his Lovenox shots he has had a lot of abdominal pain.  States mostly around his navel, crampy in nature.  Does report some vomiting when trying to eat.  He has not had any fever or chills.  No difficulty urinating.  States he did feel like he might of had some blood in his stool earlier today as it "looked funny".  States stool has been brown in color but when wiping he did notice some red streaks.  He denies history of GI bleeding in the past.  He has not had any prior abdominal surgeries.  Has never had a prior colonoscopy or endoscopy.  Past Medical History:  Diagnosis Date  . Acute ischemic stroke ([place])   . H/O mitral valve replacement with mechanical valve   . Heart disease   . High cholesterol   . History of CVA (cerebrovascular accident)    02/2011  . Hx of aortic valve replacement, mechanical   . HX: long term anticoagulant use   . Stroke [facility])     Patient Active Problem List   Diagnosis Date Noted  . Stroke ([place]) 07/31/2017  . Chronic anticoagulation   . Hyperlipidemia 02/28/2017  . Hypertension 02/28/2017  . Smoker 02/28/2017  . TIA (transient ischemic attack)   . Slurred speech   . Acute ischemic stroke ([place]) 03/19/2015  . Supratherapeutic INR 09/07/2014  . CVA (cerebral infarction) 07/16/2013  . Cerebellar stroke,  acute ([place]) 07/13/2013  . Ataxia 07/12/2013  . Vomiting 07/10/2013  . Nausea with vomiting 07/10/2013  . Subtherapeutic international normalized ratio (INR) 07/10/2013  . AKI (acute kidney injury) ([place]) 07/10/2013  . Volume depletion 07/10/2013  . Nausea & vomiting 07/10/2013  . Cellulitis of left upper arm and forearm 04/04/2012  . History of stroke   . Hx of aortic valve replacement, mechanical   . H/O mitral valve replacement with mechanical valve   . HX: long term anticoagulant use     Past Surgical History:  Procedure Laterality Date  . CARDIAC VALVE REPLACEMENT          Home Medications    Prior to Admission medications   Medication Sig Start Date End Date Taking? Authorizing Provider  amLODipine (NORVASC) 5 MG tablet Take 1 tablet (5 mg total) by mouth daily with supper. 08/02/17   Cherene Altes, MD  aspirin EC 81 MG EC tablet Take 1 tablet (81 mg total) by mouth daily. 07/31/17   Cherene Altes, MD  lisinopril (PRINIVIL,ZESTRIL) 20 MG tablet Take 1 tablet (20 mg total) by mouth daily with supper. 08/02/17   Cherene Altes, MD  pravastatin (PRAVACHOL) 20 MG tablet Take 20 mg by mouth daily with supper.  05/03/15   [provider]  tadalafil (CIALIS) 5 MG tablet Take 5 mg by mouth daily  as needed for erectile dysfunction.  07/27/17   [provider]  warfarin (COUMADIN) 10 MG tablet Take 10 mg by mouth daily.    [provider]  warfarin (COUMADIN) 7.5 MG tablet Take 7.5 mg by mouth daily.    [provider]    Family History Family History  Problem Relation Age of Onset  . Diabetes Mother        Died before her 56 birthday  . Heart attack Brother     Social History Social History   Tobacco Use  . Smoking status: Current Every Day Smoker    Packs/day: 0.05    Types: Cigarettes  . Smokeless tobacco: Never Used  . Tobacco comment: smoke two per day  Substance Use Topics  . Alcohol use: Yes    Comment: wine  coolers prn  . Drug use: Yes    Frequency: 7.0 times per week    Types: Marijuana    Comment: smoke when he can get it     Allergies   Patient has no known allergies.   Review of Systems Review of Systems  Gastrointestinal: Positive for abdominal pain.  All other systems reviewed and are negative.    Physical Exam Updated Vital Signs BP (!) 81/64 (BP Location: Right Arm)   Pulse 67   Temp 98.2 F (36.8 C) (Oral)   Resp 18   Ht 6' (1.829 m)   Wt 72.6 kg (160 lb)   SpO2 99%   BMI 21.70 kg/m   Physical Exam  Constitutional: He is oriented to person, place, and time. He appears well-developed and well-nourished.  HENT:  Head: Normocephalic and atraumatic.  Mouth/Throat: Oropharynx is clear and moist.  Eyes: Pupils are equal, round, and reactive to light. Conjunctivae and EOM are normal.  Neck: Normal range of motion.  Cardiovascular: Normal rate, regular rhythm and normal heart sounds.  Pulmonary/Chest: Effort normal and breath sounds normal.  Abdominal: Soft. Bowel sounds are normal. There is tenderness in the periumbilical area. There is no rigidity and no guarding.    Genitourinary:  Genitourinary Comments: Light brown stool noted on DRE, no gross blood or melena; no fecal impaction; no hemorrhoids or fissures  Musculoskeletal: Normal range of motion.  Swelling of left calf compared with right-- has known DVT in left leg; no focal tenderness noted; DP pulses intact bilaterally  Neurological: He is alert and oriented to person, place, and time.  Skin: Skin is warm and dry.  Psychiatric: He has a normal mood and affect.  Nursing note and vitals reviewed.    ED Treatments / Results  Labs (all labs ordered are listed, but only abnormal results are displayed) Labs Reviewed  COMPREHENSIVE METABOLIC PANEL - Abnormal; Notable for the following components:      Result Value   Sodium 134 (*)    Potassium 3.4 (*)    Chloride 98 (*)    Glucose, Bld 289 (*)     Creatinine, Ser 1.55 (*)    AST 54 (*)    Alkaline Phosphatase 403 (*)    GFR calc non Af Amer 46 (*)    GFR calc Af Amer 53 (*)    All other components within normal limits  CBC WITH DIFFERENTIAL/PLATELET - Abnormal; Notable for the following components:   RBC 3.31 (*)    Hemoglobin 10.1 (*)    HCT 30.8 (*)    Neutro Abs 7.9 (*)    All other components within normal limits  URINALYSIS, ROUTINE W REFLEX  MICROSCOPIC - Abnormal; Notable for the following components:   Color, Urine AMBER (*)    APPearance HAZY (*)    Specific Gravity, Urine >1.046 (*)    Hgb urine dipstick SMALL (*)    Protein, ur 100 (*)    Bacteria, UA RARE (*)    Squamous Epithelial / LPF 0-5 (*)    All other components within normal limits  PROTIME-INR - Abnormal; Notable for the following components:   Prothrombin Time 20.2 (*)    All other components within normal limits  BASIC METABOLIC PANEL - Abnormal; Notable for the following components:   Glucose, Bld 116 (*)    All other components within normal limits  CBC - Abnormal; Notable for the following components:   RBC 3.30 (*)    Hemoglobin 10.0 (*)    HCT 30.6 (*)    All other components within normal limits  HEMOGLOBIN A1C - Abnormal; Notable for the following components:   Hgb A1c MFr Bld 4.7 (*)    All other components within normal limits  I-STAT CG4 LACTIC ACID, ED - Abnormal; Notable for the following components:   Lactic Acid, Venous 2.46 (*)    All other components within normal limits  POC OCCULT BLOOD, ED - Abnormal; Notable for the following components:   Fecal Occult Bld POSITIVE (*)    All other components within normal limits  LIPASE, BLOOD  CBC  CBC  HEPARIN LEVEL (UNFRACTIONATED)  I-STAT CG4 LACTIC ACID, ED    EKG None  Radiology Dg Chest 2 View  Result Date: 11/07/2017 CLINICAL DATA:  Abdominal pain with nausea vomiting EXAM: CHEST - 2 VIEW COMPARISON:  03/21/2015 FINDINGS: Post sternotomy changes. Scarring at the lingula  and left base. Cardiomediastinal silhouette within normal limits. Aortic atherosclerosis. No pneumothorax. IMPRESSION: No active cardiopulmonary disease. Scarring at the lingula and left base. Electronically Signed   By: Donavan Foil M.D.   On: 11/07/2017 21:42    Procedures Procedures (including critical care time)  Medications Ordered in ED Medications - No data to display   Initial Impression / Assessment and Plan / ED Course  I have reviewed the triage vital signs and the nursing notes.  Pertinent labs & imaging results that were available during my care of the patient were reviewed by me and considered in my medical decision making (see chart for details).  62 year old male presenting to the ED with abdominal pain.  This is mostly periumbilical and epigastric in nature.  He is afebrile and nontoxic.  Does have some localized tenderness on exam without peritoneal signs.  While in triage he was hypotensive into the 76E systolic, however minutes later after being placed into a room his blood pressure was in the 115/76 without any acute intervention.  Suspect this was falsely low out front.  Screening labs have been sent.  Patient does have known DVT in his left leg currently, swelling of left lower leg correlates with this.  He is on chronic Coumadin, however given levels were subtherapeutic he was bridged with Lovenox over the past few days.  States he did notice a little bit of blood in the stool when wiping earlier.  No gross blood on rectal exam, light brown stool noted.  Hemoccult is positive.  Will obtain CT scan for further evaluation.  CT scan unfortunately with lobulated mass in the distal body and tail of pancreas that is consistent with malignancy.  There is extension to the greater curvature of the stomach as well as metastatic  disease of the liver.  These results were discussed with patient in detail, he acknowledged understanding.  His daughter has gone home for the evening as she  had small child with her.  I have attempted to call her several times, however phone keeps going to voicemail.  Will try to call her again in the morning.  Patient will be admitted for ongoing care.  Discussed with Dr. Hal Hope-- he will admit for ongoing care.  6:52 AM Have tried to call patient's daughter again this morning to update her, phone still going straight to voicemail.    Final Clinical Impressions(s) / ED Diagnoses   Final diagnoses:  Pancreatic mass  Liver metastases ([place])  Blood in stool  Chronic anticoagulation    ED Discharge Orders    None       Larene Pickett, PA-C 11/08/17 3888    Duffy Bruce, MD 11/08/17 [phone]

## 2017-11-08 ENCOUNTER — Other Ambulatory Visit: Payer: Self-pay

## 2017-11-08 ENCOUNTER — Encounter (HOSPITAL_COMMUNITY): Payer: Self-pay | Admitting: Internal Medicine

## 2017-11-08 DIAGNOSIS — D62 Acute posthemorrhagic anemia: Secondary | ICD-10-CM | POA: Diagnosis not present

## 2017-11-08 DIAGNOSIS — K869 Disease of pancreas, unspecified: Secondary | ICD-10-CM

## 2017-11-08 DIAGNOSIS — I82402 Acute embolism and thrombosis of unspecified deep veins of left lower extremity: Secondary | ICD-10-CM | POA: Diagnosis present

## 2017-11-08 DIAGNOSIS — F17200 Nicotine dependence, unspecified, uncomplicated: Secondary | ICD-10-CM

## 2017-11-08 DIAGNOSIS — K625 Hemorrhage of anus and rectum: Secondary | ICD-10-CM | POA: Diagnosis not present

## 2017-11-08 DIAGNOSIS — K8689 Other specified diseases of pancreas: Secondary | ICD-10-CM | POA: Diagnosis present

## 2017-11-08 DIAGNOSIS — R109 Unspecified abdominal pain: Secondary | ICD-10-CM | POA: Diagnosis present

## 2017-11-08 DIAGNOSIS — R1013 Epigastric pain: Secondary | ICD-10-CM | POA: Diagnosis not present

## 2017-11-08 LAB — URINALYSIS, ROUTINE W REFLEX MICROSCOPIC
Bilirubin Urine: NEGATIVE
Glucose, UA: NEGATIVE mg/dL
KETONES UR: NEGATIVE mg/dL
Leukocytes, UA: NEGATIVE
Nitrite: NEGATIVE
PH: 5 (ref 5.0–8.0)
Protein, ur: 100 mg/dL — AB
Specific Gravity, Urine: >1.046 — ABNORMAL HIGH (ref 1.005–1.030)

## 2017-11-08 LAB — HEPARIN LEVEL (UNFRACTIONATED): Heparin Unfractionated: 0.12 IU/mL — ABNORMAL LOW (ref 0.30–0.70)

## 2017-11-08 LAB — CBC
HCT: 30.6 % — ABNORMAL LOW (ref 39.0–52.0)
HCT: 29.7 % — ABNORMAL LOW (ref 39.0–52.0)
HCT: 31.8 % — ABNORMAL LOW (ref 39.0–52.0)
HEMOGLOBIN: 10.2 g/dL — AB (ref 13.0–17.0)
Hemoglobin: 10 g/dL — ABNORMAL LOW (ref 13.0–17.0)
Hemoglobin: 9.7 g/dL — ABNORMAL LOW (ref 13.0–17.0)
MCH: 30.3 pg (ref 26.0–34.0)
MCH: 30.2 pg (ref 26.0–34.0)
MCH: 29.8 pg (ref 26.0–34.0)
MCHC: 32.7 g/dL (ref 30.0–36.0)
MCHC: 32.7 g/dL (ref 30.0–36.0)
MCHC: 32.1 g/dL (ref 30.0–36.0)
MCV: 92.7 fL (ref 78.0–100.0)
MCV: 92.5 fL (ref 78.0–100.0)
MCV: 93 fL (ref 78.0–100.0)
PLATELETS: 280 10*3/uL (ref 150–400)
PLATELETS: 271 10*3/uL (ref 150–400)
Platelets: 265 10*3/uL (ref 150–400)
RBC: 3.3 MIL/uL — AB (ref 4.22–5.81)
RBC: 3.21 MIL/uL — ABNORMAL LOW (ref 4.22–5.81)
RBC: 3.42 MIL/uL — AB (ref 4.22–5.81)
RDW: 13.6 % (ref 11.5–15.5)
RDW: 13.4 % (ref 11.5–15.5)
RDW: 13.3 % (ref 11.5–15.5)
WBC: 8.2 10*3/uL (ref 4.0–10.5)
WBC: 9.1 10*3/uL (ref 4.0–10.5)
WBC: 9.3 10*3/uL (ref 4.0–10.5)

## 2017-11-08 LAB — BASIC METABOLIC PANEL
Anion gap: 11 (ref 5–15)
BUN: 16 mg/dL (ref 6–20)
CALCIUM: 9 mg/dL (ref 8.9–10.3)
CO2: 24 mmol/L (ref 22–32)
CREATININE: 1.16 mg/dL (ref 0.61–1.24)
Chloride: 102 mmol/L (ref 101–111)
GFR calc non Af Amer: >60 mL/min (ref >60)
GLUCOSE: 116 mg/dL — AB (ref 65–99)
Potassium: 3.9 mmol/L (ref 3.5–5.1)
Sodium: 137 mmol/L (ref 135–145)

## 2017-11-08 LAB — HEMOGLOBIN A1C
Hgb A1c MFr Bld: 4.7 % — ABNORMAL LOW (ref 4.8–5.6)
MEAN PLASMA GLUCOSE: 88.19 mg/dL

## 2017-11-08 MED ORDER — ONDANSETRON HCL 4 MG PO TABS: 4.0000 mg | ORAL_TABLET | Freq: Four times a day (QID) | ORAL | Status: DC | PRN

## 2017-11-08 MED ORDER — ENSURE ENLIVE PO LIQD
237.0000 mL | Freq: Two times a day (BID) | ORAL | Status: DC
  Administered 2017-11-09 – 2017-11-16 (×14): 237 mL via ORAL

## 2017-11-08 MED ORDER — MORPHINE SULFATE (PF) 4 MG/ML IV SOLN
1.0000 mg | INTRAVENOUS | Status: DC | PRN
  Administered 2017-11-08 – 2017-11-10 (×4): 1 mg via INTRAVENOUS
  Filled 2017-11-08 (×4): qty 1

## 2017-11-08 MED ORDER — PANTOPRAZOLE SODIUM 40 MG IV SOLR
40.0000 mg | Freq: Two times a day (BID) | INTRAVENOUS | Status: DC
  Administered 2017-11-08 – 2017-11-09 (×3): 40 mg via INTRAVENOUS
  Filled 2017-11-08 (×3): qty 40

## 2017-11-08 MED ORDER — LISINOPRIL 20 MG PO TABS
20.0000 mg | ORAL_TABLET | Freq: Every day | ORAL | Status: DC
  Administered 2017-11-08 – 2017-11-12 (×5): 20 mg via ORAL
  Filled 2017-11-08 (×5): qty 1

## 2017-11-08 MED ORDER — ONDANSETRON HCL 4 MG/2ML IJ SOLN
4.0000 mg | Freq: Four times a day (QID) | INTRAMUSCULAR | Status: DC | PRN
  Administered 2017-11-15: 4 mg via INTRAVENOUS
  Filled 2017-11-08: qty 2

## 2017-11-08 MED ORDER — HEPARIN (PORCINE) IN NACL 100-0.45 UNIT/ML-% IJ SOLN
1150.0000 [IU]/h | INTRAMUSCULAR | Status: DC
  Administered 2017-11-08: 1050 [IU]/h via INTRAVENOUS
  Administered 2017-11-09 – 2017-11-10 (×3): 1350 [IU]/h via INTRAVENOUS
  Administered 2017-11-11 – 2017-11-12 (×2): 1150 [IU]/h via INTRAVENOUS
  Filled 2017-11-08 (×6): qty 250

## 2017-11-08 MED ORDER — ACETAMINOPHEN 325 MG PO TABS
650.0000 mg | ORAL_TABLET | Freq: Four times a day (QID) | ORAL | Status: DC | PRN
  Filled 2017-11-08: qty 2

## 2017-11-08 MED ORDER — SODIUM CHLORIDE 0.9 % IV SOLN
INTRAVENOUS | Status: AC
  Administered 2017-11-08: 05:00:00 via INTRAVENOUS

## 2017-11-08 MED ORDER — ACETAMINOPHEN 650 MG RE SUPP: 650.0000 mg | Freq: Four times a day (QID) | RECTAL | Status: DC | PRN

## 2017-11-08 MED ORDER — AMLODIPINE BESYLATE 5 MG PO TABS
5.0000 mg | ORAL_TABLET | Freq: Every day | ORAL | Status: DC
  Administered 2017-11-08 – 2017-11-12 (×5): 5 mg via ORAL
  Filled 2017-11-08 (×5): qty 1

## 2017-11-08 MED ORDER — PRAVASTATIN SODIUM 20 MG PO TABS
20.0000 mg | ORAL_TABLET | Freq: Every day | ORAL | Status: DC
  Administered 2017-11-08 – 2017-11-14 (×7): 20 mg via ORAL
  Filled 2017-11-08 (×7): qty 1

## 2017-11-08 NOTE — Progress Notes (Addendum)
Pt seen and examined, admitted this am by Dr.Kakrakandy 61/M with history of hypertension, tobacco abuse, mechanical aortic and mitral valve replacement, history of stroke in December 2018 4 months ago and recently diagnosed left lower extremity DVT presently on Lovenox bridging with Coumadin presented to the ER with complaints of abdominal pain, CT concerning for mass in pancreatic body and tail and liver lesions, concerning for Pancreatic CA with mets -Called Dr.Outlaw with GI to request a consult, he recommended IR consult for liver biopsy -check CEA/CA 19-9 -holding coumadin, on heparin now, pending biopsy -monitor Hb  Domenic Polite, MD

## 2017-11-08 NOTE — Progress Notes (Signed)
ANTICOAGULATION CONSULT NOTE - Initial Consult  Pharmacy Consult for heparin Indication: mechanical valve  No Known Allergies  Patient Measurements: Height: 6' (182.9 cm) Weight: 160 lb (72.6 kg) IBW/kg (Calculated) : 77.6 Heparin Dosing Weight: 72.6 kg  Vital Signs: Temp: 98.2 F (36.8 C) (03/27 2105) Temp Source: Oral (03/27 2105) BP: 117/81 (03/28 0445) Pulse Rate: 90 (03/28 0445)  Labs: Recent Labs    11/07/17 2126 11/07/17 2255  HGB 10.1*  --   HCT 30.8*  --   PLT 328  --   LABPROT  --  20.2*  INR  --  1.74  CREATININE 1.55*  --     Estimated Creatinine Clearance: 50.1 mL/min (A) (by C-G formula based on SCr of 1.55 mg/dL (H)).   Medical History: Past Medical History:  Diagnosis Date  . Acute ischemic stroke ([place])   . H/O mitral valve replacement with mechanical valve   . Heart disease   . High cholesterol   . History of CVA (cerebrovascular accident)    02/2011  . Hx of aortic valve replacement, mechanical   . HX: long term anticoagulant use   . Stroke [facility])     Assessment: 62 yo man to start heparin for h/o mechanical valve.  His INR is 1.74. Goal of Therapy:  Heparin level 0.3-0.7 units/ml Monitor platelets by anticoagulation protocol: Yes   Plan:  Start heparin drip at 1050 units/hr Check heparin level in 6-8 hours Daily heparin level and CBC while on heparin  Staysha Truby Poteet 11/08/2017,5:14 AM

## 2017-11-08 NOTE — Consult Note (Signed)
Chief Complaint: Patient was seen in consultation today for liver metastases.  Referring Physician(s): Domenic Polite  Supervising Physician: Marybelle Killings  Patient Status: [facility] - In-pt  History of Present Illness: Jeff [name] is a 62 y.o. male   Ct abdomen/pelvis 11/07/2017: 1. Lobulated mass in the distal body and tail of the pancreas consistent with malignancy. There is extension of the mass to the greater curvature of the stomach with loss of fat plane. 2. Multiple hepatic metastatic disease. 3. Top-normal lymph nodes in the porta pedis. 4. 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  IR consulted by Dr. Broadus John for possible image-guided liver biopsy. Patient is not NPO today. He is receiving Heprin, last dose today at 15:32. INR 11/07/2017: 1.74.   Past Medical History:  Diagnosis Date  . Acute ischemic stroke ([place])   . H/O mitral valve replacement with mechanical valve   . Heart disease   . High cholesterol   . History of CVA (cerebrovascular accident)    02/2011  . Hx of aortic valve replacement, mechanical   . HX: long term anticoagulant use   . Stroke [facility])     Past Surgical History:  Procedure Laterality Date  . CARDIAC VALVE REPLACEMENT      Allergies: Patient has no known allergies.  Medications: Prior to Admission medications   Medication Sig Start Date End Date Taking? Authorizing Provider  amLODipine (NORVASC) 5 MG tablet Take 1 tablet (5 mg total) by mouth daily with supper. 08/02/17  Yes Cherene Altes, MD  aspirin EC 81 MG EC tablet Take 1 tablet (81 mg total) by mouth daily. 07/31/17  Yes Cherene Altes, MD  lisinopril (PRINIVIL,ZESTRIL) 20 MG tablet Take 1 tablet (20 mg total) by mouth daily with supper. 08/02/17  Yes Cherene Altes, MD  pravastatin (PRAVACHOL) 20 MG tablet Take 20 mg by mouth daily with supper.  05/03/15  Yes [provider]  tadalafil (CIALIS) 5 MG tablet Take 5 mg by mouth daily as needed for  erectile dysfunction.  07/27/17  Yes [provider]  warfarin (COUMADIN) 10 MG tablet Take 10 mg by mouth daily.   Yes [provider]  warfarin (COUMADIN) 7.5 MG tablet Take 7.5 mg by mouth daily.   Yes [provider]     Family History  Problem Relation Age of Onset  . Diabetes Mother        Died before her 74 birthday  . Heart attack Brother     Social History   Socioeconomic History  . Marital status: Single    Spouse name: Not on file  . Number of children: Not on file  . Years of education: Not on file  . Highest education level: Not on file  Occupational History  . Not on file  Social Needs  . Financial resource strain: Not on file  . Food insecurity:    Worry: Not on file    Inability: Not on file  . Transportation needs:    Medical: Not on file    Non-medical: Not on file  Tobacco Use  . Smoking status: Current Every Day Smoker    Packs/day: 0.05    Types: Cigarettes  . Smokeless tobacco: Never Used  . Tobacco comment: smoke two per day  Substance and Sexual Activity  . Alcohol use: Yes    Comment: wine coolers prn  . Drug use: Yes    Frequency: 7.0 times per week    Types: Marijuana  Comment: smoke when he can get it  . Sexual activity: Yes  Lifestyle  . Physical activity:    Days per week: Not on file    Minutes per session: Not on file  . Stress: Not on file  Relationships  . Social connections:    Talks on phone: Not on file    Gets together: Not on file    Attends religious service: Not on file    Active member of club or organization: Not on file    Attends meetings of clubs or organizations: Not on file    Relationship status: Not on file  Other Topics Concern  . Not on file  Social History Narrative  . Not on file     Review of Systems: A 12 point ROS discussed and pertinent positives are indicated in the HPI above.  All other systems are negative.  Review of Systems  Constitutional: Negative for  activity change and fever.  Respiratory: Negative for shortness of breath and wheezing.   Cardiovascular: Negative for chest pain and palpitations.  Psychiatric/Behavioral: Negative for behavioral problems and confusion.    Vital Signs: BP 126/85 (BP Location: Right Arm)   Pulse 96   Temp 99.2 F (37.3 C) (Oral)   Resp 19   Ht 6' (1.829 m)   Wt 160 lb (72.6 kg)   SpO2 100%   BMI 21.70 kg/m   Physical Exam  Constitutional: He is oriented to person, place, and time. He appears well-developed and well-nourished. No distress.  Cardiovascular: Normal rate, regular rhythm and normal heart sounds.  No murmur heard. Pulmonary/Chest: Effort normal and breath sounds normal. He has no wheezes.  Neurological: He is alert and oriented to person, place, and time.  Skin: Skin is warm and dry.  Psychiatric: He has a normal mood and affect. His behavior is normal.  Nursing note and vitals reviewed.    MD Evaluation Airway: WNL Heart: WNL Abdomen: WNL Chest/ Lungs: WNL ASA  Classification: 3 Mallampati/Airway Score: One   Imaging: Dg Chest 2 View  Result Date: 11/07/2017 CLINICAL DATA:  Abdominal pain with nausea vomiting EXAM: CHEST - 2 VIEW COMPARISON:  03/21/2015 FINDINGS: Post sternotomy changes. Scarring at the lingula and left base. Cardiomediastinal silhouette within normal limits. Aortic atherosclerosis. No pneumothorax. IMPRESSION: No active cardiopulmonary disease. Scarring at the lingula and left base. Electronically Signed   By: Donavan Foil M.D.   On: 11/07/2017 21:42   Ct Abdomen Pelvis W Contrast  Result Date: 11/08/2017 CLINICAL DATA:  62 year old male with acute abdominal pain. Nausea vomiting. EXAM: CT ABDOMEN AND PELVIS WITH CONTRAST TECHNIQUE: Multidetector CT imaging of the abdomen and pelvis was performed using the standard protocol following bolus administration of intravenous contrast. CONTRAST:  146mL ISOVUE-300 IOPAMIDOL (ISOVUE-300) INJECTION 61% COMPARISON:   Abdominal CT dated 11/30/2003 FINDINGS: Lower chest: The visualized lung bases are clear. No intra-abdominal free air or free fluid. Hepatobiliary: Multiple (greater than 20) hepatic hypodense masses measure up to 4 x 5 cm in segment VI consistent with metastatic disease. No intrahepatic biliary ductal dilatation. The gallbladder is unremarkable. Pancreas: There is a 4.7 x 6.3 cm lobulated hypoenhancing mass in the distal body and tail of the pancreas consistent with malignancy. There is extension of the mass to the greater curvature of the stomach with loss of fat plane. Spleen: Normal in size without focal abnormality. Adrenals/Urinary Tract: The adrenal glands are unremarkable. A 1 cm exophytic left renal hypodense lesion is not well characterized but likely represents a  cyst. Bilateral renal cortical irregularity and scarring. There is no hydronephrosis on either side. The visualized ureters and urinary bladder appear unremarkable. Stomach/Bowel: Liquid content noted in the distal esophagus may represent gastroesophageal reflux or esophageal dysmotility. There is no bowel obstruction or active inflammation. Normal appendix. Vascular/Lymphatic: Mild aortoiliac atherosclerotic disease. The origins of the celiac axis, SMA, IMA appear patent. The SMV, and main portal vein are patent. The splenic vein is not visualized, likely occluded by the pancreatic mass. Top-normal lymph nodes in the region of the porta hepatis. Reproductive: The prostate and seminal vesicles are grossly unremarkable. Other: None Musculoskeletal: No acute osseous pathology. IMPRESSION: 1. Lobulated mass in the distal body and tail of the pancreas consistent with malignancy. There is extension of the mass to the greater curvature of the stomach with loss of fat plane. 2. Multiple hepatic metastatic disease. 3. Top-normal lymph nodes in the porta pedis. 4. 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 Electronically Signed   By: Anner Crete M.D.   On: 11/08/2017 00:34    Labs:  CBC: Recent Labs    11/07/17 2126 11/08/17 0459 11/08/17 0800 11/08/17 1312  WBC 9.2 8.2 9.1 9.3  HGB 10.1* 10.0* 9.7* 10.2*  HCT 30.8* 30.6* 29.7* 31.8*  PLT 328 280 271 265    COAGS: Recent Labs    11/22/16 1606 07/29/17 2305 11/07/17 2255  INR 5.67* 1.48 1.74  APTT  --  37*  --     BMP: Recent Labs    07/29/17 2305 07/29/17 2319 07/31/17 0231 11/07/17 2126 11/08/17 0459  NA 133* 138 137 134* 137  K 3.2* 3.2* 4.4 3.4* 3.9  CL 101 100* 106 98* 102  CO2 20*  --  26 24 24   GLUCOSE 133* 138* 123* 289* 116*  BUN 9 9 13 16 16   CALCIUM 9.2  --  9.2 9.0 9.0  CREATININE 1.09 1.10 1.07 1.55* 1.16  GFRNONAA >60  --  >60 46* >60  GFRAA >60  --  >60 53* >60    LIVER FUNCTION TESTS: Recent Labs    11/22/16 1606 07/29/17 2305 11/07/17 2126  BILITOT 0.6 0.6 1.0  AST 25 23 54*  ALT 17 17 56  ALKPHOS 100 102 403*  PROT 7.1 7.1 6.7  ALBUMIN 4.2 4.2 3.6    TUMOR MARKERS: No results for input(s): AFPTM, CEA, CA199, CHROMGRNA in the last 8760 hours.  Assessment and Plan:  Liver metastases. Plan for image-guided liver biopsy tomorrow with Dr. Annamaria Boots. Patient will be NPO at midnight. INR ordered for tomorrow morning. Will hold Heprin when we know procedure time tomorrow.  Risks and benefits discussed with the patient including, but not limited to bleeding, infection, damage to adjacent structures or low yield requiring additional tests. All of the patient's questions were answered, patient is agreeable to proceed. Consent signed and in chart.  Thank you for this interesting consult.  I greatly enjoyed meeting Jeff [name] and look forward to participating in their care.  A copy of this report was sent to the requesting provider on this date.  Electronically Signed: Earley Abide, PA-C 11/08/2017, 4:31 PM   I spent a total of 30 minutes in face to face in clinical consultation, greater than 50% of which was  counseling/coordinating care for liver metastases.

## 2017-11-08 NOTE — Progress Notes (Signed)
[place] for heparin Indication: mechanical valve  No Known Allergies  Patient Measurements: Height: 6' (182.9 cm) Weight: 160 lb 0.9 oz (72.6 kg) IBW/kg (Calculated) : 77.6 Heparin Dosing Weight: 72.6 kg  Vital Signs: Temp: 99.2 F (37.3 C) (03/28 1623) Temp Source: Oral (03/28 1623) BP: 126/85 (03/28 1623) Pulse Rate: 96 (03/28 1623)  Labs: Recent Labs    11/07/17 2126 11/07/17 2255 11/08/17 0459 11/08/17 0800 11/08/17 1312 11/08/17 2002  HGB 10.1*  --  10.0* 9.7* 10.2*  --   HCT 30.8*  --  30.6* 29.7* 31.8*  --   PLT 328  --  280 271 265  --   LABPROT  --  20.2*  --   --   --   --   INR  --  1.74  --   --   --   --   HEPARINUNFRC  --   --   --   --  0.12* <0.10*  CREATININE 1.55*  --  1.16  --   --   --     Estimated Creatinine Clearance: 66.9 mL/min (by C-G formula based on SCr of 1.16 mg/dL).   Medical History: Past Medical History:  Diagnosis Date  . Acute ischemic stroke ([place])   . H/O mitral valve replacement with mechanical valve   . Heart disease   . High cholesterol   . History of CVA (cerebrovascular accident)    02/2011  . Hx of aortic valve replacement, mechanical   . HX: long term anticoagulant use   . Stroke [facility])     Assessment: 62 yo man on heparin for h/o mechanical valve. Recent diagnosis of DVT in Dec 2018.  INR is  1.74 on admit. CT concerning for mass in pancreatic body.  Heparin drip 1350 uts/hr HL < 0.1 - pt iv line has been occluded> Therapist, sports changing IV site - will continue same drip rate and recheck in am  CBC stable, no overt bleeding reported  Goal of Therapy:  Heparin level 0.3-0.7 units/ml Monitor platelets by anticoagulation protocol: Yes   Plan:  Coninue heparin drip  1350 units/hr Daily heparin level and CBC while on heparin  Bonnita Nasuti Pharm.D. CPP, BCPS Clinical Pharmacist [phone] 11/08/2017 9:34 PM

## 2017-11-08 NOTE — ED Notes (Signed)
Pt given juice, soda, broth, and pop sickle.

## 2017-11-08 NOTE — H&P (Addendum)
History and Physical    Jeff [name] ZGY:[id number] DOB: November 30, 1955 DOA: 11/07/2017  PCP: Kerin Perna, NP  Patient coming from: Home.  Chief Complaint: Abdominal pain.  HPI: Jeff [name] is a 62 y.o. male with history of hypertension, tobacco abuse, mechanical aortic and mitral valve replacement, history of stroke in December 2018 4 months ago and recently diagnosed left lower extremity DVT presently on Lovenox bridging with Coumadin presents to the ER with complaints of abdominal pain.  Patient states over the last 2 weeks patient has been having progressive epigastric pain with nausea vomiting up to eat he eats each time.  Also has lost weight.  Patient also last few days has noticed some blood in his stools while wiping.  Epigastric pain is sharp sometimes radiating to the back.  Denies any chest pain or shortness of breath.  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  Stool for occult blood was positive.  Hemoglobin has dropped by 2 g from previous.  Patient is being admitted for further management.  Review of Systems: As per HPI, rest all negative.   Past Medical History:  Diagnosis Date  . Acute ischemic stroke ([place])   . H/O mitral valve replacement with mechanical valve   . Heart disease   . High cholesterol   . History of CVA (cerebrovascular accident)    02/2011  . Hx of aortic valve replacement, mechanical   . HX: long term anticoagulant use   . Stroke [facility])     Past Surgical History:  Procedure Laterality Date  . CARDIAC VALVE REPLACEMENT       reports that he has been smoking cigarettes.  He has been smoking about 0.05 packs per day. He has never used smokeless tobacco. He reports that he drinks alcohol. He reports that he has current or past drug history. Drug: Marijuana. Frequency: 7.00 times per week.  No Known Allergies  Family History  Problem Relation Age of Onset    . Diabetes Mother        Died before her 15 birthday  . Heart attack Brother     Prior to Admission medications   Medication Sig Start Date End Date Taking? Authorizing Provider  amLODipine (NORVASC) 5 MG tablet Take 1 tablet (5 mg total) by mouth daily with supper. 08/02/17  Yes Cherene Altes, MD  aspirin EC 81 MG EC tablet Take 1 tablet (81 mg total) by mouth daily. 07/31/17  Yes Cherene Altes, MD  lisinopril (PRINIVIL,ZESTRIL) 20 MG tablet Take 1 tablet (20 mg total) by mouth daily with supper. 08/02/17  Yes Cherene Altes, MD  pravastatin (PRAVACHOL) 20 MG tablet Take 20 mg by mouth daily with supper.  05/03/15  Yes [provider]  tadalafil (CIALIS) 5 MG tablet Take 5 mg by mouth daily as needed for erectile dysfunction.  07/27/17  Yes [provider]  warfarin (COUMADIN) 10 MG tablet Take 10 mg by mouth daily.   Yes [provider]  warfarin (COUMADIN) 7.5 MG tablet Take 7.5 mg by mouth daily.   Yes [provider]    Physical Exam: Vitals:   11/08/17 0315 11/08/17 0330 11/08/17 0345 11/08/17 0400  BP: 113/86 103/73 104/75 105/79  Pulse: 91 91 90 89  Resp:    18  Temp:      TempSrc:      SpO2: 98% 99% 100% 100%  Weight:  Height:          Constitutional: Moderately built and nourished. Vitals:   11/08/17 0315 11/08/17 0330 11/08/17 0345 11/08/17 0400  BP: 113/86 103/73 104/75 105/79  Pulse: 91 91 90 89  Resp:    18  Temp:      TempSrc:      SpO2: 98% 99% 100% 100%  Weight:      Height:       Eyes: Anicteric no pallor. ENMT: No discharge from the ears eyes nose or mouth. Neck: No mass felt.  No neck rigidity. Respiratory: No rhonchi or crepitations. Cardiovascular: S1-S2 heard no murmurs appreciated. Abdomen: Soft nontender bowel sounds present. Musculoskeletal: Left lower extremity swelling. Skin: No rash. Neurologic: Alert awake oriented to time place and person.  Moves all extremities 5 x  5. Psychiatric: Appears normal.  Normal affect.   Labs on Admission: I have personally reviewed following labs and imaging studies  CBC: Recent Labs  Lab 11/07/17 2126  WBC 9.2  NEUTROABS 7.9*  HGB 10.1*  HCT 30.8*  MCV 93.1  PLT 875   Basic Metabolic Panel: Recent Labs  Lab 11/07/17 2126  NA 134*  K 3.4*  CL 98*  CO2 24  GLUCOSE 289*  BUN 16  CREATININE 1.55*  CALCIUM 9.0   GFR: Estimated Creatinine Clearance: 50.1 mL/min (A) (by C-G formula based on SCr of 1.55 mg/dL (H)). Liver Function Tests: Recent Labs  Lab 11/07/17 2126  AST 54*  ALT 56  ALKPHOS 403*  BILITOT 1.0  PROT 6.7  ALBUMIN 3.6   Recent Labs  Lab 11/07/17 2126  LIPASE 33   No results for input(s): AMMONIA in the last 168 hours. Coagulation Profile: Recent Labs  Lab 11/07/17 2255  INR 1.74   Cardiac Enzymes: No results for input(s): CKTOTAL, CKMB, CKMBINDEX, TROPONINI in the last 168 hours. BNP (last 3 results) No results for input(s): PROBNP in the last 8760 hours. HbA1C: No results for input(s): HGBA1C in the last 72 hours. CBG: No results for input(s): GLUCAP in the last 168 hours. Lipid Profile: No results for input(s): CHOL, HDL, LDLCALC, TRIG, CHOLHDL, LDLDIRECT in the last 72 hours. Thyroid Function Tests: No results for input(s): TSH, T4TOTAL, FREET4, T3FREE, THYROIDAB in the last 72 hours. Anemia Panel: No results for input(s): VITAMINB12, FOLATE, FERRITIN, TIBC, IRON, RETICCTPCT in the last 72 hours. Urine analysis:    Component Value Date/Time   COLORURINE AMBER (A) 11/08/2017 0338   APPEARANCEUR HAZY (A) 11/08/2017 0338   LABSPEC >1.046 (H) 11/08/2017 0338   PHURINE 5.0 11/08/2017 0338   GLUCOSEU NEGATIVE 11/08/2017 0338   HGBUR SMALL (A) 11/08/2017 0338   BILIRUBINUR NEGATIVE 11/08/2017 0338   KETONESUR NEGATIVE 11/08/2017 0338   PROTEINUR 100 (A) 11/08/2017 0338   UROBILINOGEN 1.0 07/10/2013 1401   NITRITE NEGATIVE 11/08/2017 0338   LEUKOCYTESUR NEGATIVE  11/08/2017 0338   Sepsis Labs: @LABRCNTIP (procalcitonin:4,lacticidven:4) )No results found for this or any previous visit (from the past 240 hour(s)).   Radiological Exams on Admission: Dg Chest 2 View  Result Date: 11/07/2017 CLINICAL DATA:  Abdominal pain with nausea vomiting EXAM: CHEST - 2 VIEW COMPARISON:  03/21/2015 FINDINGS: Post sternotomy changes. Scarring at the lingula and left base. Cardiomediastinal silhouette within normal limits. Aortic atherosclerosis. No pneumothorax. IMPRESSION: No active cardiopulmonary disease. Scarring at the lingula and left base. Electronically Signed   By: Donavan Foil M.D.   On: 11/07/2017 21:42   Ct Abdomen Pelvis W Contrast  Result Date: 11/08/2017 CLINICAL DATA:  62 year old male with acute abdominal pain. Nausea vomiting. EXAM: CT ABDOMEN AND PELVIS WITH CONTRAST TECHNIQUE: Multidetector CT imaging of the abdomen and pelvis was performed using the standard protocol following bolus administration of intravenous contrast. CONTRAST:  170mL ISOVUE-300 IOPAMIDOL (ISOVUE-300) INJECTION 61% COMPARISON:  Abdominal CT dated 11/30/2003 FINDINGS: Lower chest: The visualized lung bases are clear. No intra-abdominal free air or free fluid. Hepatobiliary: Multiple (greater than 20) hepatic hypodense masses measure up to 4 x 5 cm in segment VI consistent with metastatic disease. No intrahepatic biliary ductal dilatation. The gallbladder is unremarkable. Pancreas: There is a 4.7 x 6.3 cm lobulated hypoenhancing mass in the distal body and tail of the pancreas consistent with malignancy. There is extension of the mass to the greater curvature of the stomach with loss of fat plane. Spleen: Normal in size without focal abnormality. Adrenals/Urinary Tract: The adrenal glands are unremarkable. A 1 cm exophytic left renal hypodense lesion is not well characterized but likely represents a cyst. Bilateral renal cortical irregularity and scarring. There is no hydronephrosis on  either side. The visualized ureters and urinary bladder appear unremarkable. Stomach/Bowel: Liquid content noted in the distal esophagus may represent gastroesophageal reflux or esophageal dysmotility. There is no bowel obstruction or active inflammation. Normal appendix. Vascular/Lymphatic: Mild aortoiliac atherosclerotic disease. The origins of the celiac axis, SMA, IMA appear patent. The SMV, and main portal vein are patent. The splenic vein is not visualized, likely occluded by the pancreatic mass. Top-normal lymph nodes in the region of the porta hepatis. Reproductive: The prostate and seminal vesicles are grossly unremarkable. Other: None Musculoskeletal: No acute osseous pathology. IMPRESSION: 1. Lobulated mass in the distal body and tail of the pancreas consistent with malignancy. There is extension of the mass to the greater curvature of the stomach with loss of fat plane. 2. Multiple hepatic metastatic disease. 3. Top-normal lymph nodes in the porta pedis. 4. 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 Electronically Signed   By: Anner Crete M.D.   On: 11/08/2017 00:34     Assessment/Plan Principal Problem:   Abdominal pain Active Problems:   History of stroke   Hx of aortic valve replacement, mechanical   Hypertension   Smoker   Pancreatic mass   Rectal bleeding   Acute deep vein thrombosis (DVT) of left lower extremity (HCC)   Acute blood loss anemia    1. 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  Will need to consult GI in a.m. for possible biopsy and further recommendations. 2. Rectal bleeding -follow CBC.  Transfuse if hemoglobin less than 7.  Not holding anticoagulation secondary to patient having a mechanical valve and also recent DVT.  Will place patient on Protonix.  Consult gastroenterologist in a.m. 3. Acute blood loss anemia -follow CBC. 4. On  anticoagulation for mechanical aortic valve and mitral valve and recent left lower extremity DVT -patient was started on Coumadin and Lovenox bridging recently for left lower extremity DVT.  Patient also has mechanical aortic and mitral valve per the chart.  For now I have placed patient on heparin infusion.  Closely follow CBC and also follow for any worsening GI bleed. 5. Hypertension on amlodipine and lisinopril. 6. History of stroke presently on heparin and statins. 7. Tobacco abuse -advised to quit smoking. 8. Hyperglycemia -will check hemoglobin A1c.   DVT prophylaxis: Heparin. Code Status: Full code. Family Communication: Discussed with patient. Disposition Plan: Home.  Consults called: None. Admission status: Observation.   Rise Patience MD [facility] Pager [phone].  If 7PM-7AM, please contact night-coverage [email or web address] Password [facility]  11/08/2017, 4:21 AM

## 2017-11-08 NOTE — Progress Notes (Signed)
[place] for heparin Indication: mechanical valve  No Known Allergies  Patient Measurements: Height: 6' (182.9 cm) Weight: 160 lb (72.6 kg) IBW/kg (Calculated) : 77.6 Heparin Dosing Weight: 72.6 kg  Vital Signs: BP: 139/106 (03/28 1200) Pulse Rate: 92 (03/28 1315)  Labs: Recent Labs    11/07/17 2126 11/07/17 2255 11/08/17 0459 11/08/17 0800 11/08/17 1312  HGB 10.1*  --  10.0* 9.7* 10.2*  HCT 30.8*  --  30.6* 29.7* 31.8*  PLT 328  --  280 271 265  LABPROT  --  20.2*  --   --   --   INR  --  1.74  --   --   --   HEPARINUNFRC  --   --   --   --  0.12*  CREATININE 1.55*  --  1.16  --   --     Estimated Creatinine Clearance: 66.9 mL/min (by C-G formula based on SCr of 1.16 mg/dL).   Medical History: Past Medical History:  Diagnosis Date  . Acute ischemic stroke ([place])   . H/O mitral valve replacement with mechanical valve   . Heart disease   . High cholesterol   . History of CVA (cerebrovascular accident)    02/2011  . Hx of aortic valve replacement, mechanical   . HX: long term anticoagulant use   . Stroke [facility])     Assessment: 62 yo man to start heparin for h/o mechanical valve. Recent diagnosis of DVT in Dec 2018.  INR is  1.74 on admit. CT concerning for mass in pancreatic body.   Initial heparin level subtherapeutic: 0.12, CBC stable, no overt bleeding reported  Goal of Therapy:  Heparin level 0.3-0.7 units/ml Monitor platelets by anticoagulation protocol: Yes   Plan:  Increase heparin drip to 1350 units/hr Check heparin level in 6-8 hours Daily heparin level and CBC while on heparin  Jesscia Imm L Ranie Chinchilla 11/08/2017,2:39 PM

## 2017-11-09 DIAGNOSIS — K921 Melena: Secondary | ICD-10-CM

## 2017-11-09 DIAGNOSIS — Z6821 Body mass index (BMI) 21.0-21.9, adult: Secondary | ICD-10-CM | POA: Diagnosis not present

## 2017-11-09 DIAGNOSIS — R739 Hyperglycemia, unspecified: Secondary | ICD-10-CM | POA: Diagnosis present

## 2017-11-09 DIAGNOSIS — I82402 Acute embolism and thrombosis of unspecified deep veins of left lower extremity: Secondary | ICD-10-CM | POA: Diagnosis not present

## 2017-11-09 DIAGNOSIS — Z79899 Other long term (current) drug therapy: Secondary | ICD-10-CM | POA: Diagnosis not present

## 2017-11-09 DIAGNOSIS — E440 Moderate protein-calorie malnutrition: Secondary | ICD-10-CM

## 2017-11-09 DIAGNOSIS — D62 Acute posthemorrhagic anemia: Secondary | ICD-10-CM | POA: Diagnosis present

## 2017-11-09 DIAGNOSIS — Z8673 Personal history of transient ischemic attack (TIA), and cerebral infarction without residual deficits: Secondary | ICD-10-CM | POA: Diagnosis not present

## 2017-11-09 DIAGNOSIS — E7800 Pure hypercholesterolemia, unspecified: Secondary | ICD-10-CM | POA: Diagnosis present

## 2017-11-09 DIAGNOSIS — M3211 Endocarditis in systemic lupus erythematosus: Secondary | ICD-10-CM | POA: Diagnosis present

## 2017-11-09 DIAGNOSIS — R7989 Other specified abnormal findings of blood chemistry: Secondary | ICD-10-CM | POA: Diagnosis present

## 2017-11-09 DIAGNOSIS — I2699 Other pulmonary embolism without acute cor pulmonale: Secondary | ICD-10-CM | POA: Diagnosis present

## 2017-11-09 DIAGNOSIS — C787 Secondary malignant neoplasm of liver and intrahepatic bile duct: Secondary | ICD-10-CM | POA: Diagnosis present

## 2017-11-09 DIAGNOSIS — Z7982 Long term (current) use of aspirin: Secondary | ICD-10-CM | POA: Diagnosis not present

## 2017-11-09 DIAGNOSIS — Z952 Presence of prosthetic heart valve: Secondary | ICD-10-CM | POA: Diagnosis not present

## 2017-11-09 DIAGNOSIS — I824Y2 Acute embolism and thrombosis of unspecified deep veins of left proximal lower extremity: Secondary | ICD-10-CM | POA: Diagnosis not present

## 2017-11-09 DIAGNOSIS — R1013 Epigastric pain: Secondary | ICD-10-CM | POA: Diagnosis present

## 2017-11-09 DIAGNOSIS — N179 Acute kidney failure, unspecified: Secondary | ICD-10-CM | POA: Diagnosis present

## 2017-11-09 DIAGNOSIS — I82432 Acute embolism and thrombosis of left popliteal vein: Secondary | ICD-10-CM | POA: Diagnosis present

## 2017-11-09 DIAGNOSIS — R109 Unspecified abdominal pain: Secondary | ICD-10-CM | POA: Diagnosis not present

## 2017-11-09 DIAGNOSIS — R1030 Lower abdominal pain, unspecified: Secondary | ICD-10-CM | POA: Diagnosis not present

## 2017-11-09 DIAGNOSIS — R791 Abnormal coagulation profile: Secondary | ICD-10-CM | POA: Diagnosis present

## 2017-11-09 DIAGNOSIS — F17210 Nicotine dependence, cigarettes, uncomplicated: Secondary | ICD-10-CM | POA: Diagnosis present

## 2017-11-09 DIAGNOSIS — Z7901 Long term (current) use of anticoagulants: Secondary | ICD-10-CM | POA: Diagnosis not present

## 2017-11-09 DIAGNOSIS — I4891 Unspecified atrial fibrillation: Secondary | ICD-10-CM | POA: Diagnosis present

## 2017-11-09 DIAGNOSIS — R970 Elevated carcinoembryonic antigen [CEA]: Secondary | ICD-10-CM | POA: Diagnosis present

## 2017-11-09 DIAGNOSIS — F1290 Cannabis use, unspecified, uncomplicated: Secondary | ICD-10-CM | POA: Diagnosis present

## 2017-11-09 DIAGNOSIS — C259 Malignant neoplasm of pancreas, unspecified: Secondary | ICD-10-CM | POA: Diagnosis present

## 2017-11-09 DIAGNOSIS — I10 Essential (primary) hypertension: Secondary | ICD-10-CM | POA: Diagnosis present

## 2017-11-09 LAB — COMPREHENSIVE METABOLIC PANEL
ALT: 50 U/L (ref 17–63)
ANION GAP: 12 (ref 5–15)
AST: 60 U/L — ABNORMAL HIGH (ref 15–41)
Albumin: 3.4 g/dL — ABNORMAL LOW (ref 3.5–5.0)
Alkaline Phosphatase: 393 U/L — ABNORMAL HIGH (ref 38–126)
BILIRUBIN TOTAL: 1.3 mg/dL — AB (ref 0.3–1.2)
BUN: 11 mg/dL (ref 6–20)
CO2: 22 mmol/L (ref 22–32)
Calcium: 8.9 mg/dL (ref 8.9–10.3)
Chloride: 102 mmol/L (ref 101–111)
Creatinine, Ser: 1.01 mg/dL (ref 0.61–1.24)
GFR calc non Af Amer: >60 mL/min (ref >60)
GLUCOSE: 109 mg/dL — AB (ref 65–99)
POTASSIUM: 4.1 mmol/L (ref 3.5–5.1)
SODIUM: 136 mmol/L (ref 135–145)
TOTAL PROTEIN: 6.4 g/dL — AB (ref 6.5–8.1)

## 2017-11-09 LAB — HEPARIN LEVEL (UNFRACTIONATED)
Heparin Unfractionated: 0.7 IU/mL (ref 0.30–0.70)
Heparin Unfractionated: 0.6 IU/mL (ref 0.30–0.70)

## 2017-11-09 LAB — PROTIME-INR
INR: 1.94
Prothrombin Time: 22 seconds — ABNORMAL HIGH (ref 11.4–15.2)

## 2017-11-09 LAB — CBC
HCT: 31.6 % — ABNORMAL LOW (ref 39.0–52.0)
Hemoglobin: 10.5 g/dL — ABNORMAL LOW (ref 13.0–17.0)
MCH: 30.8 pg (ref 26.0–34.0)
MCHC: 33.2 g/dL (ref 30.0–36.0)
MCV: 92.7 fL (ref 78.0–100.0)
PLATELETS: 279 10*3/uL (ref 150–400)
RBC: 3.41 MIL/uL — ABNORMAL LOW (ref 4.22–5.81)
RDW: 13.8 % (ref 11.5–15.5)
WBC: 10.3 10*3/uL (ref 4.0–10.5)

## 2017-11-09 MED ORDER — PANTOPRAZOLE SODIUM 40 MG PO TBEC
40.0000 mg | DELAYED_RELEASE_TABLET | Freq: Two times a day (BID) | ORAL | Status: DC
  Administered 2017-11-09 – 2017-11-16 (×14): 40 mg via ORAL
  Filled 2017-11-09 (×14): qty 1

## 2017-11-09 MED ORDER — HYDROCORTISONE ACETATE 25 MG RE SUPP
25.0000 mg | Freq: Two times a day (BID) | RECTAL | Status: AC
  Administered 2017-11-09 – 2017-11-13 (×9): 25 mg via RECTAL
  Filled 2017-11-09 (×9): qty 1

## 2017-11-09 MED ORDER — POLYETHYLENE GLYCOL 3350 17 G PO PACK
17.0000 g | PACK | Freq: Every morning | ORAL | Status: DC
  Administered 2017-11-09 – 2017-11-16 (×8): 17 g via ORAL
  Filled 2017-11-09 (×8): qty 1

## 2017-11-09 NOTE — Progress Notes (Signed)
ANTICOAGULATION CONSULT NOTE   Pharmacy Consult for heparin Indication: mechanical valve  No Known Allergies  Patient Measurements: Height: 6' (182.9 cm) Weight: 160 lb 0.9 oz (72.6 kg) IBW/kg (Calculated) : 77.6 Heparin Dosing Weight: 72.6 kg  Vital Signs: Temp: 98.5 F (36.9 C) (03/29 0527) BP: 116/85 (03/29 0527) Pulse Rate: 107 (03/29 0527)  Labs: Recent Labs    11/07/17 2126 11/07/17 2255 11/08/17 0459 11/08/17 0800 11/08/17 1312 11/08/17 2002 11/09/17 0546  HGB 10.1*  --  10.0* 9.7* 10.2*  --  10.5*  HCT 30.8*  --  30.6* 29.7* 31.8*  --  31.6*  PLT 328  --  280 271 265  --  279  LABPROT  --  20.2*  --   --   --   --  22.0*  INR  --  1.74  --   --   --   --  1.94  HEPARINUNFRC  --   --   --   --  0.12* <0.10* 0.70  CREATININE 1.55*  --  1.16  --   --   --   --     Estimated Creatinine Clearance: 66.9 mL/min (by C-G formula based on SCr of 1.16 mg/dL).   Medical History: Past Medical History:  Diagnosis Date  . Acute ischemic stroke ([place])   . H/O mitral valve replacement with mechanical valve   . Heart disease   . High cholesterol   . History of CVA (cerebrovascular accident)    02/2011  . Hx of aortic valve replacement, mechanical   . HX: long term anticoagulant use   . Stroke [facility])     Assessment: 62 yo man on heparin for h/o mechanical valve. Recent diagnosis of DVT in Dec 2018.  INR is  1.74 on admit. CT concerning for mass in pancreatic body.  Heparin drip at 1350 uts/hr prior to hold for biopsy this AM. HL 0.7 prior to hold (therapeutic) heparin restarted back at 1350 unis/hr at ~0900. Will continue heparin at current rate and get HL this afternoon to confirm.   CBC stable, no overt bleeding reported  Goal of Therapy:  Heparin level 0.3-0.7 units/ml Monitor platelets by anticoagulation protocol: Yes   Plan:  [place] heparin drip  1350 units/hr Heparin level at 1500 Daily heparin level and CBC while on heparin  Annaleese Guier, Pharm.D  BCPS Clinical Pharmacist [phone] 11/09/2017 10:46 AM

## 2017-11-09 NOTE — Progress Notes (Signed)
ANTICOAGULATION CONSULT NOTE   Pharmacy Consult for heparin Indication: mechanical valve  No Known Allergies  Patient Measurements: Height: 6' (182.9 cm) Weight: 160 lb 0.9 oz (72.6 kg) IBW/kg (Calculated) : 77.6 Heparin Dosing Weight: 72.6 kg  Vital Signs: Temp: 99.6 F (37.6 C) (03/29 1314) Temp Source: Oral (03/29 1314) BP: 101/67 (03/29 1314) Pulse Rate: 90 (03/29 1314)  Labs: Recent Labs    11/07/17 2126 11/07/17 2255 11/08/17 0459 11/08/17 0800  11/08/17 1312 11/08/17 2002 11/09/17 0546 11/09/17 1202 11/09/17 1457  HGB 10.1*  --  10.0* 9.7*  --  10.2*  --  10.5*  --   --   HCT 30.8*  --  30.6* 29.7*  --  31.8*  --  31.6*  --   --   PLT 328  --  280 271  --  265  --  279  --   --   LABPROT  --  20.2*  --   --   --   --   --  22.0*  --   --   INR  --  1.74  --   --   --   --   --  1.94  --   --   HEPARINUNFRC  --   --   --   --    < > 0.12* <0.10* 0.70  --  0.60  CREATININE 1.55*  --  1.16  --   --   --   --   --  1.01  --    < > = values in this interval not displayed.    Estimated Creatinine Clearance: 76.9 mL/min (by C-G formula based on SCr of 1.01 mg/dL).   Medical History: Past Medical History:  Diagnosis Date  . Acute ischemic stroke ([place])   . H/O mitral valve replacement with mechanical valve   . Heart disease   . High cholesterol   . History of CVA (cerebrovascular accident)    02/2011  . Hx of aortic valve replacement, mechanical   . HX: long term anticoagulant use   . Stroke [facility])     Assessment: 62 yo man on heparin for h/o mechanical valve. Recent diagnosis of DVT in Dec 2018.  INR is  1.74 on admit. CT concerning for mass in pancreatic body.  Heparin drip at 1350 uts/hr prior to hold for biopsy this AM. HL 0.7 prior to hold (therapeutic) heparin restarted back at 1350 unis/hr at ~0900. F/U Heparin level is 0.6 (therapeutic)  CBC stable, no overt bleeding reported  Goal of Therapy:  Heparin level 0.3-0.7 units/ml Monitor platelets by  anticoagulation protocol: Yes   Plan:  Continue heparin drip at 1350 units/hr Heparin level in AM Daily heparin level and CBC while on heparin  Frederick Klinger, Pharm.D BCPS Clinical Pharmacist [phone] 11/09/2017 3:45 PM

## 2017-11-09 NOTE — Progress Notes (Deleted)
Jeff [name] is a 62 y.o. male patient admitted from ED awake, alert - oriented  X 4 - no acute distress noted.  VSS - Blood pressure 101/67, pulse 90, temperature 99.6 F (37.6 C), temperature source Oral, resp. rate 20, height 6' (1.829 m), weight 72.6 kg (160 lb 0.9 oz), SpO2 98 %.    IV in place, occlusive dsg intact without redness.  Orientation to room, and floor completed with information packet given to patient/family.  Patient declined safety video at this time.  Admission INP armband ID verified with patient/family, and in place.   SR up x 2, fall assessment complete, with patient and family able to verbalize understanding of risk associated with falls, and verbalized understanding to call nsg before up out of bed.  Call light within reach, patient able to voice, and demonstrate understanding.  Skin, clean-dry- intact without evidence of bruising, or skin tears.   No evidence of skin break down noted on exam.     Will cont to eval and treat per MD orders.  Dorris Carnes, RN 11/09/2017 2:18 PM

## 2017-11-09 NOTE — Progress Notes (Signed)
Restarted heparin drips at 0857.

## 2017-11-09 NOTE — Progress Notes (Signed)
PROGRESS NOTE  [name] GLO:[id number] DOB: [birth year] DOA: 11/07/2017 PCP: Kerin Perna, NP  HPI/Recap of past 24 hours:  Jeff [name] is a 62 y.o. year old male with medical history significant for HTN, tobacco abuse, mechanical aortic and mitral valve replacement, history of stroke in December 2018 and recent diagnosis of DVT in his left LE who presented on 11/07/2017 with abdominal pain, nausea and vomiting and was found to have a pancreatic mass concerning for malignancy with possible liver metastasis.  Interval History Patient is doing well this morning. Patient reports some abdominal pain but denies any nausea and vomiting since admission. Patient would good appetite and requesting food.  Assessment/Plan: Principal Problem:   Abdominal pain Active Problems:   History of stroke   Hx of aortic valve replacement, mechanical   Hypertension   Smoker   Pancreatic mass   Rectal bleeding   Acute deep vein thrombosis (DVT) of left lower extremity (HCC)   Acute blood loss anemia   #Abdominal pain in the setting of pancreatic mass  Patient presented with abdominal pain, nausea and vomiting for the past 2 weeks. CT abdomen pelvis showed mass on the tail of the pancreas consistent with pancreatic cancer. Patient was also found to have multiple liver lesions likely metastasis. Patient was evaluated by IR and scheduled for liver biopsy today but INR is to high. Procedure has been reschedule on Monday --Monitor INR and CBC --Morphine  1 mg IV q3 prn for moderate/ severe pain  --Tylenol 650 mg q6  --Follow up on CEA and CA 19-9  #Rectal bleeding, ongoing Patient reports GI bleeding for the past two weeks. He has been noticing the blood in the bowl and when he wipes. Patient was FOBT positive when he was admitted. Hemoglobin on admission was 10.1. This morning is 10.5. Given new diagnosis of cancer and likely actively bleed will need further evaluation. Patient is on Heparin due to  mechanical aortic and mitral valves. He also have a newly diagnosed DVT --Continue  --Consult GI , follow up on recommendations --Follow up on CBC  #Anemia, stable Likely secondary to GI bleed. Hemoglobin is stable. Patient on coumadin because of mechanical heart aortic and mitral  Valves. Currently on Heparin drip --Monitor CBC and follow up in GI recommendations.  #Hypertension, stable  Blood pressure is well controlled since admission. Will continue home regimen. --Continue amlodipine 5 mg daily --Continue lisinopril  20 mg daily   #Left lower extremity DVT, subacute, stable Patient found to have a left LE DVT while on coumadin. Patient has a history of being subtherapeutic on his INR. Patient is also hypercoagulable in the setting of pancreatic cancer.  #AKI resolved On admission, creatinine was 1.55 down trending to 1.16 (baseline 1.1-1.2). Will continue to monitor. IVF as need encourage hydration. --Follow up on BMP  #Transaminitis Patient with elevated alk phos 403 on admission with elevated AST 5.4 and normal bili. Several liver lesions suspicious for metastasis from pancreatic cancer. Patient will undergo liver biopsy on Monday 4/1. --Follow up on CMP  Code Status: Full   Family Communication: No family at bedside   Disposition Plan: Home pending clinical improvement   Consultants:  GI  Procedures:  None  Antimicrobials:  None   Cultures:  None   Telemetry: yes  DVT prophylaxis: Heparin Drip   Objective: Vitals:   11/08/17 1500 11/08/17 1623 11/08/17 2252 11/09/17 0527  BP: 126/89 126/85 123/80 116/85  Pulse: 92 96 99 (!) 107  Resp:  _0 Temp:  99.2 F (37.3 C) 98.5 F (36.9 C) 98.5 F (36.9 C)  TempSrc:  Oral    SpO2: 96% 100% 99% 100%  Weight:  160 lb 0.9 oz (72.6 kg)    Height:  6' (1.829 m)      Intake/Output Summary (Last 24 hours) at 11/09/2017 1021 Last data filed at 11/09/2017 0526 Gross per 24 hour  Intake 1303.89 ml    Output 120 ml  Net 1183.89 ml   Filed Weights   11/07/17 2105 11/08/17 1623  Weight: 160 lb (72.6 kg) 160 lb 0.9 oz (72.6 kg)    Exam: Constitutional:normal appearing male/male Eyes: EOMI, anicteric, normal conjunctivae ENMT: Oropharynx with moist mucous membranes, normal dentition Neck: FROM, no thyromegaly Cardiovascular: RRR no MRGs, with no peripheral edema Respiratory: Normal respiratory effort, clear breath sounds  Abdomen: Soft,non-tender, with no HSM Skin: No rash ulcers, or lesions. Without skin tenting  Neurologic: Grossly no focal neuro deficit. Psychiatric:Appropriate affect, and mood. Mental status AAOx3  Data Reviewed: CBC: Recent Labs  Lab 11/07/17 2126 11/08/17 0459 11/08/17 0800 11/08/17 1312 11/09/17 0546  WBC 9.2 8.2 9.1 9.3 10.3  NEUTROABS 7.9*  --   --   --   --   HGB 10.1* 10.0* 9.7* 10.2* 10.5*  HCT 30.8* 30.6* 29.7* 31.8* 31.6*  MCV 93.1 92.7 92.5 93.0 92.7  PLT 328 280 271 265 850   Basic Metabolic Panel: Recent Labs  Lab 11/07/17 2126 11/08/17 0459  NA 134* 137  K 3.4* 3.9  CL 98* 102  CO2 24 24  GLUCOSE 289* 116*  BUN 16 16  CREATININE 1.55* 1.16  CALCIUM 9.0 9.0   GFR: Estimated Creatinine Clearance: 66.9 mL/min (by C-G formula based on SCr of 1.16 mg/dL). Liver Function Tests: Recent Labs  Lab 11/07/17 2126  AST 54*  ALT 56  ALKPHOS 403*  BILITOT 1.0  PROT 6.7  ALBUMIN 3.6   Recent Labs  Lab 11/07/17 2126  LIPASE 33   No results for input(s): AMMONIA in the last 168 hours. Coagulation Profile: Recent Labs  Lab 11/07/17 2255 11/09/17 0546  INR 1.74 1.94   Cardiac Enzymes: No results for input(s): CKTOTAL, CKMB, CKMBINDEX, TROPONINI in the last 168 hours. BNP (last 3 results) No results for input(s): PROBNP in the last 8760 hours. HbA1C: Recent Labs    11/08/17 0459  HGBA1C 4.7*   CBG: No results for input(s): GLUCAP in the last 168 hours. Lipid Profile: No results for input(s): CHOL, HDL, LDLCALC,  TRIG, CHOLHDL, LDLDIRECT in the last 72 hours. Thyroid Function Tests: No results for input(s): TSH, T4TOTAL, FREET4, T3FREE, THYROIDAB in the last 72 hours. Anemia Panel: No results for input(s): VITAMINB12, FOLATE, FERRITIN, TIBC, IRON, RETICCTPCT in the last 72 hours. Urine analysis:    Component Value Date/Time   COLORURINE AMBER (A) 11/08/2017 0338   APPEARANCEUR HAZY (A) 11/08/2017 0338   LABSPEC >1.046 (H) 11/08/2017 0338   PHURINE 5.0 11/08/2017 0338   GLUCOSEU NEGATIVE 11/08/2017 0338   HGBUR SMALL (A) 11/08/2017 0338   BILIRUBINUR NEGATIVE 11/08/2017 0338   KETONESUR NEGATIVE 11/08/2017 0338   PROTEINUR 100 (A) 11/08/2017 0338   UROBILINOGEN 1.0 07/10/2013 1401   NITRITE NEGATIVE 11/08/2017 0338   LEUKOCYTESUR NEGATIVE 11/08/2017 0338   Sepsis Labs: _1 (procalcitonin:4,lacticidven:4)  )No results found for this or any previous visit (from the past 240 hour(s)).    Studies: No results found.  Scheduled Meds: . amLODipine  5 mg Oral Q supper  .  feeding supplement (ENSURE ENLIVE)  237 mL Oral BID BM  . lisinopril  20 mg Oral Q supper  . pantoprazole (PROTONIX) IV  40 mg Intravenous Q12H  . pravastatin  20 mg Oral Q supper    Continuous Infusions: . heparin 1,350 Units/hr (11/09/17 0857)     LOS: 0 days     Marjie Skiff, MD [facility]   If 7PM-7AM, please contact night-coverage [email or web address] Password [facility] 11/09/2017, 10:21 AM

## 2017-11-09 NOTE — Plan of Care (Signed)

## 2017-11-09 NOTE — Progress Notes (Signed)
Received visit request from the nurse to have prayer with the patient. The patient was very receptive to my visit and shared prayer requests. I provided spiritual support and encouragement. I led the patient in prayer. I let the patient know that the Chaplain is available, if needed, for future visits.    11/09/17 1030  Clinical Encounter Type  Visited With Patient  Visit Type Spiritual support  Referral From Nurse  Consult/Referral To Chaplain  Spiritual Encounters  Spiritual Needs Prayer  Stress Factors  Patient Stress Factors None identified   Jeff [name]

## 2017-11-09 NOTE — Consult Note (Signed)
[facility] Gastroenterology Consultation Note  Referring Provider: Dr. Juluis Mire [facility]) Primary Care Physician:  Kerin Perna, NP  Reason for Consultation:  GI bleeding  HPI: Jeff [name] is a 62 y.o. male admitted to hospital for abdominal pain, nausea, vomiting.  Has recurrent DVT, on warfarin.  Was found on CT scan to have pancreatic mass with diffuse metastases.  Pain is crampy, periumbilical, sometimes associated with eating and nausea and vomiting.  Patient has decrease appetite and 20 lb weight loss over the past few weeks.  Concerning bleeding, he is having some red streaks on tissue paper and stool. when wiping; has had some straining and hard stool.  No prior colonoscopy.   Past Medical History:  Diagnosis Date  . Acute ischemic stroke ([place])   . H/O mitral valve replacement with mechanical valve   . Heart disease   . High cholesterol   . History of CVA (cerebrovascular accident)    02/2011  . Hx of aortic valve replacement, mechanical   . HX: long term anticoagulant use   . Stroke [facility])     Past Surgical History:  Procedure Laterality Date  . CARDIAC VALVE REPLACEMENT      Prior to Admission medications   Medication Sig Start Date End Date Taking? Authorizing Provider  amLODipine (NORVASC) 5 MG tablet Take 1 tablet (5 mg total) by mouth daily with supper. 08/02/17  Yes Cherene Altes, MD  aspirin EC 81 MG EC tablet Take 1 tablet (81 mg total) by mouth daily. 07/31/17  Yes Cherene Altes, MD  lisinopril (PRINIVIL,ZESTRIL) 20 MG tablet Take 1 tablet (20 mg total) by mouth daily with supper. 08/02/17  Yes Cherene Altes, MD  pravastatin (PRAVACHOL) 20 MG tablet Take 20 mg by mouth daily with supper.  05/03/15  Yes [provider]  tadalafil (CIALIS) 5 MG tablet Take 5 mg by mouth daily as needed for erectile dysfunction.  07/27/17  Yes [provider]  warfarin (COUMADIN) 10 MG tablet Take 10 mg by mouth daily.   Yes [provider]   warfarin (COUMADIN) 7.5 MG tablet Take 7.5 mg by mouth daily.   Yes [provider]    Current Facility-Administered Medications  Medication Dose Route Frequency Provider Last Rate Last Dose  . acetaminophen (TYLENOL) tablet 650 mg  650 mg Oral Q6H PRN Rise Patience, MD       Or  . acetaminophen (TYLENOL) suppository 650 mg  650 mg Rectal Q6H PRN Rise Patience, MD      . amLODipine (NORVASC) tablet 5 mg  5 mg Oral Q supper Rise Patience, MD   5 mg at 11/08/17 1706  . feeding supplement (ENSURE ENLIVE) (ENSURE ENLIVE) liquid 237 mL  237 mL Oral BID BM Rise Patience, MD   237 mL at 11/09/17 1004  . heparin ADULT infusion 100 units/mL (25000 units/22mL sodium chloride 0.45%)  1,350 Units/hr Intravenous Continuous Dawayne Cirri, RPH 13.5 mL/hr at 11/09/17 0857 1,350 Units/hr at 11/09/17 0857  . lisinopril (PRINIVIL,ZESTRIL) tablet 20 mg  20 mg Oral Q supper Rise Patience, MD   20 mg at 11/08/17 1706  . morphine 4 MG/ML injection 1 mg  1 mg Intravenous Q3H PRN Rise Patience, MD   1 mg at 11/09/17 1003  . ondansetron (ZOFRAN) tablet 4 mg  4 mg Oral Q6H PRN Rise Patience, MD       Or  . ondansetron [facility]) injection 4 mg  4 mg  Intravenous Q6H PRN Rise Patience, MD      . pantoprazole (PROTONIX) EC tablet 40 mg  40 mg Oral BID Hammons, Kimberly B, RPH      . pravastatin (PRAVACHOL) tablet 20 mg  20 mg Oral Q supper Rise Patience, MD   20 mg at 11/08/17 1706    Allergies as of 11/07/2017  . (No Known Allergies)    Family History  Problem Relation Age of Onset  . Diabetes Mother        Died before her 31 birthday  . Heart attack Brother     Social History   Socioeconomic History  . Marital status: Single    Spouse name: Not on file  . Number of children: Not on file  . Years of education: Not on file  . Highest education level: Not on file  Occupational History  . Not on file  Social Needs  . Financial  resource strain: Not on file  . Food insecurity:    Worry: Not on file    Inability: Not on file  . Transportation needs:    Medical: Not on file    Non-medical: Not on file  Tobacco Use  . Smoking status: Current Every Day Smoker    Packs/day: 0.05    Types: Cigarettes  . Smokeless tobacco: Never Used  . Tobacco comment: smoke two per day  Substance and Sexual Activity  . Alcohol use: Yes    Comment: wine coolers prn  . Drug use: Yes    Frequency: 7.0 times per week    Types: Marijuana    Comment: smoke when he can get it  . Sexual activity: Yes  Lifestyle  . Physical activity:    Days per week: Not on file    Minutes per session: Not on file  . Stress: Not on file  Relationships  . Social connections:    Talks on phone: Not on file    Gets together: Not on file    Attends religious service: Not on file    Active member of club or organization: Not on file    Attends meetings of clubs or organizations: Not on file    Relationship status: Not on file  . Intimate partner violence:    Fear of current or ex partner: Not on file    Emotionally abused: Not on file    Physically abused: Not on file    Forced sexual activity: Not on file  Other Topics Concern  . Not on file  Social History Narrative  . Not on file    Review of Systems: As per HPI, all others negative.  Physical Exam: Vital signs in last 24 hours: Temp:  [98.5 F (36.9 C)-99.6 F (37.6 C)] 99.6 F (37.6 C) (03/29 1314) Pulse Rate:  [90-107] 90 (03/29 1314) Resp:  [18-20] 20 (03/29 1314) BP: (101-141)/(67-89) 101/67 (03/29 1314) SpO2:  [96 %-100 %] 98 % (03/29 1314) Weight:  [160 lb 0.9 oz (72.6 kg)] 160 lb 0.9 oz (72.6 kg) (03/28 1623) Last BM Date: 11/08/17 General:   Alert,  Well-developed, well-nourished, pleasant and cooperative in NAD Head:  Normocephalic and atraumatic. Eyes:  Sclera clear, no icterus.   Conjunctiva pink. Ears:  Normal auditory acuity. Nose:  No deformity, discharge,  or  lesions. Mouth:  No deformity or lesions.  Oropharynx pink & moist. Neck:  Supple; no masses or thyromegaly. Lungs:  Clear throughout to auscultation.   No wheezes, crackles, or rhonchi. No acute distress.  Heart:  Regular rate and rhythm; no murmurs, clicks, rubs,  or gallops. Abdomen:  Soft, mild periumbilical tenderness, No masses, hepatosplenomegaly or hernias noted. Normal bowel sounds, without guarding, and without rebound.     Msk:  Symmetrical without gross deformities. Normal posture. Pulses:  Normal pulses noted. Extremities:  Without clubbing or edema. Neurologic:  Alert and  oriented x4;  grossly normal neurologically. Skin:  Intact without significant lesions or rashes. Psych:  Alert and cooperative. Normal mood and affect.   Lab Results: Recent Labs    11/08/17 0800 11/08/17 1312 11/09/17 0546  WBC 9.1 9.3 10.3  HGB 9.7* 10.2* 10.5*  HCT 29.7* 31.8* 31.6*  PLT 271 265 279   BMET Recent Labs    11/07/17 2126 11/08/17 0459  NA 134* 137  K 3.4* 3.9  CL 98* 102  CO2 24 24  GLUCOSE 289* 116*  BUN 16 16  CREATININE 1.55* 1.16  CALCIUM 9.0 9.0   LFT Recent Labs    11/07/17 2126  PROT 6.7  ALBUMIN 3.6  AST 54*  ALT 56  ALKPHOS 403*  BILITOT 1.0   PT/INR Recent Labs    11/07/17 2255 11/09/17 0546  LABPROT 20.2* 22.0*  INR 1.74 1.94    Studies/Results: Dg Chest 2 View  Result Date: 11/07/2017 CLINICAL DATA:  Abdominal pain with nausea vomiting EXAM: CHEST - 2 VIEW COMPARISON:  03/21/2015 FINDINGS: Post sternotomy changes. Scarring at the lingula and left base. Cardiomediastinal silhouette within normal limits. Aortic atherosclerosis. No pneumothorax. IMPRESSION: No active cardiopulmonary disease. Scarring at the lingula and left base. Electronically Signed   By: Donavan Foil M.D.   On: 11/07/2017 21:42   Ct Abdomen Pelvis W Contrast  Result Date: 11/08/2017 CLINICAL DATA:  62 year old male with acute abdominal pain. Nausea vomiting. EXAM: CT  ABDOMEN AND PELVIS WITH CONTRAST TECHNIQUE: Multidetector CT imaging of the abdomen and pelvis was performed using the standard protocol following bolus administration of intravenous contrast. CONTRAST:  146mL ISOVUE-300 IOPAMIDOL (ISOVUE-300) INJECTION 61% COMPARISON:  Abdominal CT dated 11/30/2003 FINDINGS: Lower chest: The visualized lung bases are clear. No intra-abdominal free air or free fluid. Hepatobiliary: Multiple (greater than 20) hepatic hypodense masses measure up to 4 x 5 cm in segment VI consistent with metastatic disease. No intrahepatic biliary ductal dilatation. The gallbladder is unremarkable. Pancreas: There is a 4.7 x 6.3 cm lobulated hypoenhancing mass in the distal body and tail of the pancreas consistent with malignancy. There is extension of the mass to the greater curvature of the stomach with loss of fat plane. Spleen: Normal in size without focal abnormality. Adrenals/Urinary Tract: The adrenal glands are unremarkable. A 1 cm exophytic left renal hypodense lesion is not well characterized but likely represents a cyst. Bilateral renal cortical irregularity and scarring. There is no hydronephrosis on either side. The visualized ureters and urinary bladder appear unremarkable. Stomach/Bowel: Liquid content noted in the distal esophagus may represent gastroesophageal reflux or esophageal dysmotility. There is no bowel obstruction or active inflammation. Normal appendix. Vascular/Lymphatic: Mild aortoiliac atherosclerotic disease. The origins of the celiac axis, SMA, IMA appear patent. The SMV, and main portal vein are patent. The splenic vein is not visualized, likely occluded by the pancreatic mass. Top-normal lymph nodes in the region of the porta hepatis. Reproductive: The prostate and seminal vesicles are grossly unremarkable. Other: None Musculoskeletal: No acute osseous pathology. IMPRESSION: 1. Lobulated mass in the distal body and tail of the pancreas consistent with malignancy. There  is extension of the mass to  the greater curvature of the stomach with loss of fat plane. 2. Multiple hepatic metastatic disease. 3. Top-normal lymph nodes in the porta pedis. 4. 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 Electronically Signed   By: Anner Crete M.D.   On: 11/08/2017 00:34   Impression:  1.  Abnormal CT abdomen, suspected metastatic pancreatic cancer. 2.  Anemia with mild hematochezia. 3.  Chronic anticoagulation, warfarin, for DVT.  Plan:  1.  Patient's metastatic pancreatic cancer is by far the top of patient's current medical problems.   2.  The patient's hematochezia is scant and mild.  I would not consider colonoscopy, as any results would be exceedingly unlikely to affect or alter current management. 3.  Consider Miralax 17 grams po qd and Anusol suppositories pr bid x 5 days for constipation and bleeding. 4.  I would not pursue any further investigation, now or ever in the future, for patient's hematochezia. 5.  [facility] will sign-off; please call with questions; thank you for the consultation.   LOS: 0 days   Kairen Hallinan M  11/09/2017, 1:31 PM  Cell [phone] If no answer or after 5 PM call [phone]

## 2017-11-09 NOTE — Progress Notes (Signed)
Patient ID: Jeff [name], male   DOB: December 08, 1955, 62 y.o.   MRN: [id number]   Pt was tentatively scheduled for Liver biopsy in IR today  Off coumadin but INR higher today at 1.94  Cannot move forward with biopsy safely at that level Will reschedule to [facility] per Dr Annamaria Boots  RN aware Can restart Heparin drip from out standpoint

## 2017-11-09 NOTE — Progress Notes (Signed)
Initial Nutrition Assessment  DOCUMENTATION CODES:   Non-severe (moderate) malnutrition in context of chronic illness  INTERVENTION:   Ensure Enlive po BID, each supplement provides 350 kcal and 20 grams of protein  NUTRITION DIAGNOSIS:   Moderate Malnutrition related to chronic illness(pancreatic mass) as evidenced by energy intake < 75% for > or equal to 1 month, moderate fat depletion, moderate muscle depletion, severe muscle depletion, severe fat depletion.  GOAL:   Patient will meet greater than or equal to 90% of their needs  MONITOR:   PO intake, Supplement acceptance, Weight trends, Labs  REASON FOR ASSESSMENT:   Malnutrition Screening Tool    ASSESSMENT:   Patient with PMH significant for HTN, tobacco abuse, mechanical aortic and mitral valve replacement, history of stroke in December 2018 4 months ago and recently diagnosed left lower extremity DVT. Presents this admission with abdominal pain with pancreatic mass concerning for malignancy. Plan for GI consult and biopsy.    Spoke with pt at bedside. Admits that over the last 2-3 weeks, he would vomit upon eating. He would try to eat 4-5 times per day and often found himself spitting food in the garbage due to feelings of nauseous. Prior to this time period pt claims he "ate like a dog" consuming food almost every hour. Meals consist of mostly fast food and what his daughter would bring him (meat and grain). Pt had grits, jello, and tomato soup for breakfast this morning without complication. Pt denies any taste changes or trouble swallowing. Pt amendable to Ensure.   Pt endorses a UBW of 189 lb the last time being at that weight 3 months ago. Records indicate pt weighed 169 lb 07/29/17 and 160 lb this stay (5.3% wt loss in three months, insignificant for time frame). Nutrition-Focused physical exam completed.   Medications reviewed.  Labs reviewed.   NUTRITION - FOCUSED PHYSICAL EXAM:    Most Recent Value  Orbital  Region  Mild depletion  Upper Arm Region  Moderate depletion  Thoracic and Lumbar Region  Severe depletion  Buccal Region  Moderate depletion  Temple Region  Moderate depletion  Clavicle Bone Region  Severe depletion  Clavicle and Acromion Bone Region  Severe depletion  Scapular Bone Region  Severe depletion  Dorsal Hand  Mild depletion  Patellar Region  Moderate depletion  Anterior Thigh Region  Moderate depletion  Posterior Calf Region  Mild depletion  Edema (RD Assessment)  Mild  Hair  Reviewed  Eyes  Reviewed  Mouth  Reviewed  Skin  Reviewed  Nails  Reviewed       Diet Order:  Diet Heart Room service appropriate? Yes; Fluid consistency: Thin Diet NPO time specified Except for: Sips with Meds  EDUCATION NEEDS:   Education needs have been addressed  Skin:  Skin Assessment: Reviewed RN Assessment  Last BM:  11/08/17  Height:   Ht Readings from Last 1 Encounters:  11/08/17 6' (1.829 m)    Weight:   Wt Readings from Last 1 Encounters:  11/08/17 160 lb 0.9 oz (72.6 kg)    Ideal Body Weight:  80.9 kg  BMI:  Body mass index is 21.71 kg/m.  Estimated Nutritional Needs:   Kcal:  2250-2450 kcal/day  Protein:  125-135 g/day  Fluid:  >2.2 L/day    Mariana Single RD, LDN Clinical Nutrition Pager # - [phone]

## 2017-11-10 DIAGNOSIS — R1013 Epigastric pain: Secondary | ICD-10-CM

## 2017-11-10 LAB — PROTIME-INR
INR: 1.74
PROTHROMBIN TIME: 20.2 s — AB (ref 11.4–15.2)

## 2017-11-10 LAB — HEPARIN LEVEL (UNFRACTIONATED)
HEPARIN UNFRACTIONATED: 0.9 [IU]/mL — AB (ref 0.30–0.70)
HEPARIN UNFRACTIONATED: 0.65 [IU]/mL (ref 0.30–0.70)

## 2017-11-10 LAB — CBC
HCT: 30 % — ABNORMAL LOW (ref 39.0–52.0)
Hemoglobin: 9.5 g/dL — ABNORMAL LOW (ref 13.0–17.0)
MCH: 29.5 pg (ref 26.0–34.0)
MCHC: 31.7 g/dL (ref 30.0–36.0)
MCV: 93.2 fL (ref 78.0–100.0)
PLATELETS: 306 10*3/uL (ref 150–400)
RBC: 3.22 MIL/uL — ABNORMAL LOW (ref 4.22–5.81)
RDW: 13.4 % (ref 11.5–15.5)
WBC: 10.5 10*3/uL (ref 4.0–10.5)

## 2017-11-10 MED ORDER — MORPHINE SULFATE (PF) 4 MG/ML IV SOLN
1.0000 mg | INTRAVENOUS | Status: DC | PRN
  Administered 2017-11-10 – 2017-11-16 (×11): 1 mg via INTRAVENOUS
  Filled 2017-11-10 (×11): qty 1

## 2017-11-10 NOTE — Progress Notes (Signed)
[employer] for heparin Indication: mechanical valve  No Known Allergies  Patient Measurements: Height: 6' (182.9 cm) Weight: 160 lb 0.9 oz (72.6 kg) IBW/kg (Calculated) : 77.6 Heparin Dosing Weight: 72.6 kg  Vital Signs: Temp: 99.6 F (37.6 C) (03/30 1626) Temp Source: Oral (03/30 1626) BP: 119/79 (03/30 1626) Pulse Rate: 91 (03/30 1626)  Labs: Recent Labs    11/07/17 2126 11/07/17 2255 11/08/17 0459  11/08/17 1312  11/09/17 0546 11/09/17 1202 11/09/17 1457 11/10/17 0517 11/10/17 1528  HGB 10.1*  --  10.0*   < > 10.2*  --  10.5*  --   --  9.5*  --   HCT 30.8*  --  30.6*   < > 31.8*  --  31.6*  --   --  30.0*  --   PLT 328  --  280   < > 265  --  279  --   --  306  --   LABPROT  --  20.2*  --   --   --   --  22.0*  --   --  20.2*  --   INR  --  1.74  --   --   --   --  1.94  --   --  1.74  --   HEPARINUNFRC  --   --   --   --  0.12*   < > 0.70  --  0.60 0.90* 0.65  CREATININE 1.55*  --  1.16  --   --   --   --  1.01  --   --   --    < > = values in this interval not displayed.    Estimated Creatinine Clearance: 76.9 mL/min (by C-G formula based on SCr of 1.01 mg/dL).  Assessment: 62 y.o. male with h/o DVT and AVR/MVR, Coumadin on hold for biopsy. Pharmacy consulted to dose heparin. Heparin level now therapeutic at 0.65 after rate decrease. INR 1.74, Hg down to 9.5, plt wnl. No bleed documented.  Goal of Therapy:  Heparin level 0.3-0.7 units/ml Monitor platelets by anticoagulation protocol: Yes   Plan:  Continue heparin at 1150 units/hr Check heparin level in 8 hours to confirm Monitor daily heparin level and CBC, s/sx bleeding Coumadin on hold  Elicia Lamp, PharmD, BCPS Clinical Pharmacist 11/10/2017 4:31 PM

## 2017-11-10 NOTE — Progress Notes (Signed)
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  Assessment & Plan: Principal Problem:   Abdominal pain Active Problems:   History of stroke   Hx of aortic valve replacement, mechanical   Hypertension   Smoker   Pancreatic mass   Rectal bleeding   Acute deep vein thrombosis (DVT) of left lower extremity (HCC)   Acute blood loss anemia   Malnutrition of moderate degree   Blood in stool   Abdominal pain in the setting of underlying pancreatic mass -IR consulted for biopsy, however INR is too high at this point, and his biopsy has been rescheduled for Monday, INR 1.7 this morning, trending down -Continue to monitor INR and CBCs through the weekend, hemoglobin 9.5 -CEA elevated to 19, CA 19-9 pending -For now symptomatic management, pain control -High concern for pancreatic malignancy  Bright blood blood per rectum -Patient reports bright red blood with bowel movements for the last couple weeks -Hemoglobin overall is stable, continue heparin infusion, continue to hold Coumadin in preparation for biopsy on Monday -GI consulted, no need for colonoscopy or any other procedures at this point  Acute blood loss anemia -Hemoglobin overall stable, continue to monitor, 9.5 this morning from 10.5 yesterday  Hypertension -Blood pressure well controlled 110/80 this morning, continue current regimen with Norvasc and lisinopril   Left lower extremity DVT -While on Coumadin, he has a history of being subtherapeutic which is probably why he had a  DVT. -For now continue heparin  Acute kidney injury -Resolved  Elevated LFTs -Likely due to liver metastases  DVT prophylaxis: Heparin infusion Code Status: Full code Family Communication: No family at bedside Disposition Plan: Home once biopsy is done, can probably be bridged back Lovenox/Coumadin  Consultants:   Gastroenterology  Interventional radiology  Procedures:   None   Antimicrobials:  None    Subjective: - no chest pain, shortness of breath, complains of right-sided abdominal pain, has no nausea or vomiting.   Objective: Vitals:   11/09/17 1314 11/09/17 1603 11/09/17 2148 11/10/17 0609  BP: 101/67 114/87 (!) 121/91 110/80  Pulse: 90  99 90  Resp: 20  18 16   Temp: 99.6 F (37.6 C)  100.3 F (37.9 C) 98.3 F (36.8 C)  TempSrc: Oral     SpO2: 98%  97% 93%  Weight:      Height:       No intake or output data in the 24 hours ending 11/10/17 1048 Filed Weights   11/07/17 2105 11/08/17 1623  Weight: 72.6 kg (160 lb) 72.6 kg (160 lb 0.9 oz)    Examination:  Constitutional: NAD Eyes: lids and conjunctivae normal ENMT: Mucous membranes are moist.  Respiratory: clear to auscultation bilaterally, no wheezing, no crackles. Normal respiratory effort. No accessory muscle use.  Cardiovascular: Regular rate and rhythm, mechanical click heard. No LE edema. Abdomen: Slightly tender right upper quadrant, bowel sounds positive, no guarding Skin: no rashes Neurologic: non focal, equal strength   Data Reviewed: I have independently reviewed following labs and imaging studies   CBC: Recent Labs  Lab 11/07/17 2126 11/08/17  9937 11/08/17 0800 11/08/17 1312 11/09/17 0546 11/10/17 0517  WBC 9.2 8.2 9.1 9.3 10.3 10.5  NEUTROABS 7.9*  --   --   --   --   --   HGB 10.1* 10.0* 9.7* 10.2* 10.5* 9.5*  HCT 30.8* 30.6* 29.7* 31.8* 31.6* 30.0*  MCV 93.1 92.7 92.5 93.0 92.7 93.2  PLT 328 280 271 265 279 169   Basic Metabolic Panel: Recent Labs  Lab  11/07/17 2126 11/08/17 0459 11/09/17 1202  NA 134* 137 136  K 3.4* 3.9 4.1  CL 98* 102 102  CO2 24 24 22   GLUCOSE 289* 116* 109*  BUN 16 16 11   CREATININE 1.55* 1.16 1.01  CALCIUM 9.0 9.0 8.9   GFR: Estimated Creatinine Clearance: 76.9 mL/min (by C-G formula based on SCr of 1.01 mg/dL). Liver Function Tests: Recent Labs  Lab 11/07/17 2126 11/09/17 1202  AST 54* 60*  ALT 56 50  ALKPHOS 403* 393*  BILITOT 1.0 1.3*  PROT 6.7 6.4*  ALBUMIN 3.6 3.4*   Recent Labs  Lab 11/07/17 2126  LIPASE 33   No results for input(s): AMMONIA in the last 168 hours. Coagulation Profile: Recent Labs  Lab 11/07/17 2255 11/09/17 0546 11/10/17 0517  INR 1.74 1.94 1.74   Cardiac Enzymes: No results for input(s): CKTOTAL, CKMB, CKMBINDEX, TROPONINI in the last 168 hours. BNP (last 3 results) No results for input(s): PROBNP in the last 8760 hours. HbA1C: Recent Labs    11/08/17 0459  HGBA1C 4.7*   CBG: No results for input(s): GLUCAP in the last 168 hours. Lipid Profile: No results for input(s): CHOL, HDL, LDLCALC, TRIG, CHOLHDL, LDLDIRECT in the last 72 hours. Thyroid Function Tests: No results for input(s): TSH, T4TOTAL, FREET4, T3FREE, THYROIDAB in the last 72 hours. Anemia Panel: No results for input(s): VITAMINB12, FOLATE, FERRITIN, TIBC, IRON, RETICCTPCT in the last 72 hours. Urine analysis:    Component Value Date/Time   COLORURINE AMBER (A) 11/08/2017 0338   APPEARANCEUR HAZY (A) 11/08/2017 0338   LABSPEC >1.046 (H) 11/08/2017 0338   PHURINE 5.0 11/08/2017 0338   GLUCOSEU NEGATIVE 11/08/2017 0338   HGBUR SMALL (A) 11/08/2017 0338   BILIRUBINUR NEGATIVE 11/08/2017 0338   KETONESUR NEGATIVE 11/08/2017 0338   PROTEINUR 100 (A) 11/08/2017 0338   UROBILINOGEN 1.0 07/10/2013 1401   NITRITE NEGATIVE 11/08/2017 0338   LEUKOCYTESUR NEGATIVE 11/08/2017 0338   Sepsis Labs: Invalid input(s): PROCALCITONIN, LACTICIDVEN  No results found for this or any previous visit (from  the past 240 hour(s)).    Radiology Studies: No results found.   Scheduled Meds: . amLODipine  5 mg Oral Q supper  . feeding supplement (ENSURE ENLIVE)  237 mL Oral BID BM  . hydrocortisone  25 mg Rectal BID  . lisinopril  20 mg Oral Q supper  . pantoprazole  40 mg Oral BID  . polyethylene glycol  17 g Oral q morning - 10a  . pravastatin  20 mg Oral Q supper   Continuous Infusions: . heparin 1,150 Units/hr (11/10/17 0739)    Marzetta Board, MD, PhD [facility] Pager [phone]  If 7PM-7AM, please contact night-coverage [email or web address] Password [facility] 11/10/2017, 10:48 AM

## 2017-11-10 NOTE — Progress Notes (Signed)
[place] for heparin Indication: mechanical valve  No Known Allergies  Patient Measurements: Height: 6' (182.9 cm) Weight: 160 lb 0.9 oz (72.6 kg) IBW/kg (Calculated) : 77.6 Heparin Dosing Weight: 72.6 kg  Vital Signs: Temp: 98.3 F (36.8 C) (03/30 0609) BP: 110/80 (03/30 0609) Pulse Rate: 90 (03/30 0609)  Labs: Recent Labs    11/07/17 2126 11/07/17 2255 11/08/17 0459  11/08/17 1312  11/09/17 0546 11/09/17 1202 11/09/17 1457 11/10/17 0517  HGB 10.1*  --  10.0*   < > 10.2*  --  10.5*  --   --  9.5*  HCT 30.8*  --  30.6*   < > 31.8*  --  31.6*  --   --  30.0*  PLT 328  --  280   < > 265  --  279  --   --  306  LABPROT  --  20.2*  --   --   --   --  22.0*  --   --   --   INR  --  1.74  --   --   --   --  1.94  --   --   --   HEPARINUNFRC  --   --   --   --  0.12*   < > 0.70  --  0.60 0.90*  CREATININE 1.55*  --  1.16  --   --   --   --  1.01  --   --    < > = values in this interval not displayed.    Estimated Creatinine Clearance: 76.9 mL/min (by C-G formula based on SCr of 1.01 mg/dL).  Assessment: 62 y.o. male with h/o DVT and AVR/MVR, Coumadin on hold, for heparin  Goal of Therapy:  Heparin level 0.3-0.7 units/ml Monitor platelets by anticoagulation protocol: Yes   Plan:  Decrease Heparin 1150 units/hr Check heparin level in 8 hours.  Phillis Knack, PharmD, BCPS  11/10/2017 7:31 AM

## 2017-11-11 LAB — CBC
HEMATOCRIT: 28.5 % — AB (ref 39.0–52.0)
HEMOGLOBIN: 9.2 g/dL — AB (ref 13.0–17.0)
MCH: 29.9 pg (ref 26.0–34.0)
MCHC: 32.3 g/dL (ref 30.0–36.0)
MCV: 92.5 fL (ref 78.0–100.0)
Platelets: 304 10*3/uL (ref 150–400)
RBC: 3.08 MIL/uL — AB (ref 4.22–5.81)
RDW: 13.4 % (ref 11.5–15.5)
WBC: 9.9 10*3/uL (ref 4.0–10.5)

## 2017-11-11 LAB — COMPREHENSIVE METABOLIC PANEL
ALT: 42 U/L (ref 17–63)
ANION GAP: 9 (ref 5–15)
AST: 41 U/L (ref 15–41)
Albumin: 2.9 g/dL — ABNORMAL LOW (ref 3.5–5.0)
Alkaline Phosphatase: 369 U/L — ABNORMAL HIGH (ref 38–126)
BILIRUBIN TOTAL: 1.5 mg/dL — AB (ref 0.3–1.2)
BUN: 12 mg/dL (ref 6–20)
CHLORIDE: 101 mmol/L (ref 101–111)
CO2: 26 mmol/L (ref 22–32)
Calcium: 8.6 mg/dL — ABNORMAL LOW (ref 8.9–10.3)
Creatinine, Ser: 0.92 mg/dL (ref 0.61–1.24)
GFR calc non Af Amer: >60 mL/min (ref >60)
Glucose, Bld: 116 mg/dL — ABNORMAL HIGH (ref 65–99)
POTASSIUM: 3.9 mmol/L (ref 3.5–5.1)
Sodium: 136 mmol/L (ref 135–145)
Total Protein: 6 g/dL — ABNORMAL LOW (ref 6.5–8.1)

## 2017-11-11 LAB — HEPARIN LEVEL (UNFRACTIONATED): HEPARIN UNFRACTIONATED: 0.4 [IU]/mL (ref 0.30–0.70)

## 2017-11-11 LAB — PROTIME-INR
INR: 1.27
PROTHROMBIN TIME: 15.7 s — AB (ref 11.4–15.2)

## 2017-11-11 NOTE — Progress Notes (Signed)
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  Assessment & Plan: Principal Problem:   Abdominal pain Active Problems:   History of stroke   Hx of aortic valve replacement, mechanical   Hypertension   Smoker   Pancreatic mass   Rectal bleeding   Acute deep vein thrombosis (DVT) of left lower extremity (HCC)   Acute blood loss anemia   Malnutrition of moderate degree   Blood in stool   Abdominal pain in the setting of underlying pancreatic mass -IR consulted for biopsy, however INR is too high at this point, and his biopsy has been rescheduled for Monday, INR 1.7 this morning, trending down -Continue to monitor INR and CBCs through the weekend, hemoglobin 9.5 -CEA elevated to 19, CA 19-9 322,500 -For now symptomatic management, pain control -High concern for pancreatic malignancy IR to do Biopsy on Monday.   History of mechanical Aortic and mitral valve replacement;  Continue with heparin.  Monitor hb.   Bright blood blood per rectum -Patient reports bright red blood with bowel movements for the last couple weeks -Hemoglobin overall is stable, continue heparin infusion, continue to hold Coumadin in preparation for biopsy on Monday -GI consulted, no need for colonoscopy or any other procedures at this point monitor hb.   Acute blood loss anemia -hb at 9. Follow trend.   Hypertension -continue with Norvasc and lisinopril.   Left lower extremity DVT -While on Coumadin, he has a history of being subtherapeutic which is  probably why he had a DVT. -For now continue heparin  Acute kidney injury -Resolved  Elevated LFTs -Likely due to liver metastases  DVT prophylaxis: Heparin infusion Code Status: Full code Family Communication: Sister at bedside.  Disposition Plan: will need to resume coumadin after biopsy   Consultants:   Gastroenterology  Interventional radiology  Procedures:   None   Antimicrobials:  None    Subjective: He is feeling ok, mild abdominal pain   Objective: Vitals:   11/10/17 0609 11/10/17 1626 11/10/17 2122 11/11/17 0607  BP: 110/80 119/79 113/77 114/82  Pulse: 90 91 100 88  Resp: 16 20 17 18   Temp: 98.3 F (36.8 C) 99.6 F (37.6 C) 98 F (36.7 C) 98.8 F (37.1 C)  TempSrc:  Oral Oral Oral  SpO2: 93% 99% 99% 98%  Weight:      Height:        Intake/Output Summary (Last 24 hours) at 11/11/2017 0724 Last data filed at 11/11/2017 0443 Gross per 24 hour  Intake 1315.97 ml  Output -  Net 1315.97 ml   Filed Weights   11/07/17 2105 11/08/17 1623  Weight: 72.6 kg (160 lb) 72.6 kg (160 lb 0.9 oz)    Examination:  Constitutional; NAD ENMT: MMM Respiratory: CTA, normal respiratory effort.  Cardiovascular:  S 1, S 2 RRR, Click of metallic valve.  Abdomen: soft, mild tenderness Skin: No rash  Neurologic: Non focal.   Data Reviewed: I have independently reviewed following labs and imaging studies   CBC: Recent Labs  Lab 11/07/17 2126 11/08/17 0459 11/08/17 0800 11/08/17 1312 11/09/17  9450 11/10/17 0517  WBC 9.2 8.2 9.1 9.3 10.3 10.5  NEUTROABS 7.9*  --   --   --   --   --   HGB 10.1* 10.0* 9.7* 10.2* 10.5* 9.5*  HCT 30.8* 30.6* 29.7* 31.8* 31.6* 30.0*  MCV 93.1 92.7 92.5 93.0 92.7 93.2  PLT 328 280 271 265 279 388   Basic Metabolic Panel: Recent Labs  Lab 11/07/17 2126 11/08/17 0459 11/09/17 1202 11/11/17 0336  NA 134* 137 136 136  K 3.4* 3.9 4.1 3.9  CL 98* 102 102 101  CO2 24 24 22 26   GLUCOSE 289* 116* 109* 116*  BUN 16 16 11 12     CREATININE 1.55* 1.16 1.01 0.92  CALCIUM 9.0 9.0 8.9 8.6*   GFR: Estimated Creatinine Clearance: 84.4 mL/min (by C-G formula based on SCr of 0.92 mg/dL). Liver Function Tests: Recent Labs  Lab 11/07/17 2126 11/09/17 1202 11/11/17 0336  AST 54* 60* 41  ALT 56 50 42  ALKPHOS 403* 393* 369*  BILITOT 1.0 1.3* 1.5*  PROT 6.7 6.4* 6.0*  ALBUMIN 3.6 3.4* 2.9*   Recent Labs  Lab 11/07/17 2126  LIPASE 33   No results for input(s): AMMONIA in the last 168 hours. Coagulation Profile: Recent Labs  Lab 11/07/17 2255 11/09/17 0546 11/10/17 0517 11/11/17 0336  INR 1.74 1.94 1.74 1.27   Cardiac Enzymes: No results for input(s): CKTOTAL, CKMB, CKMBINDEX, TROPONINI in the last 168 hours. BNP (last 3 results) No results for input(s): PROBNP in the last 8760 hours. HbA1C: No results for input(s): HGBA1C in the last 72 hours. CBG: No results for input(s): GLUCAP in the last 168 hours. Lipid Profile: No results for input(s): CHOL, HDL, LDLCALC, TRIG, CHOLHDL, LDLDIRECT in the last 72 hours. Thyroid Function Tests: No results for input(s): TSH, T4TOTAL, FREET4, T3FREE, THYROIDAB in the last 72 hours. Anemia Panel: No results for input(s): VITAMINB12, FOLATE, FERRITIN, TIBC, IRON, RETICCTPCT in the last 72 hours. Urine analysis:    Component Value Date/Time   COLORURINE AMBER (A) 11/08/2017 0338   APPEARANCEUR HAZY (A) 11/08/2017 0338   LABSPEC >1.046 (H) 11/08/2017 0338   PHURINE 5.0 11/08/2017 0338   GLUCOSEU NEGATIVE 11/08/2017 0338   HGBUR SMALL (A) 11/08/2017 0338   BILIRUBINUR NEGATIVE 11/08/2017 0338   KETONESUR NEGATIVE 11/08/2017 0338   PROTEINUR 100 (A) 11/08/2017 0338   UROBILINOGEN 1.0 07/10/2013 1401   NITRITE NEGATIVE 11/08/2017 0338   LEUKOCYTESUR NEGATIVE 11/08/2017 0338   Sepsis Labs: Invalid input(s): PROCALCITONIN, LACTICIDVEN  No results found for this or any previous visit (from the past 240 hour(s)).    Radiology Studies: No results  found.   Scheduled Meds: . amLODipine  5 mg Oral Q supper  . feeding supplement (ENSURE ENLIVE)  237 mL Oral BID BM  . hydrocortisone  25 mg Rectal BID  . lisinopril  20 mg Oral Q supper  . pantoprazole  40 mg Oral BID  . polyethylene glycol  17 g Oral q morning - 10a  . pravastatin  20 mg Oral Q supper   Continuous Infusions: . heparin 1,150 Units/hr (11/11/17 0346)    Niel Hummer, MD.  [facility] Pager [phone]  If 7PM-7AM, please contact night-coverage [email or web address] Password [facility] 11/11/2017, 7:24 AM

## 2017-11-11 NOTE — Progress Notes (Signed)
[place] for heparin Indication: mechanical valve  No Known Allergies  Patient Measurements: Height: 6' (182.9 cm) Weight: 160 lb 0.9 oz (72.6 kg) IBW/kg (Calculated) : 77.6 Heparin Dosing Weight: 72.6 kg  Vital Signs: Temp: 98.8 F (37.1 C) (03/31 0607) Temp Source: Oral (03/31 0607) BP: 114/82 (03/31 0607) Pulse Rate: 88 (03/31 0607)  Labs: Recent Labs    11/09/17 0546 11/09/17 1202  11/10/17 0517 11/10/17 1528 11/11/17 0336 11/11/17 0733  HGB 10.5*  --   --  9.5*  --   --  9.2*  HCT 31.6*  --   --  30.0*  --   --  28.5*  PLT 279  --   --  306  --   --  304  LABPROT 22.0*  --   --  20.2*  --  15.7*  --   INR 1.94  --   --  1.74  --  1.27  --   HEPARINUNFRC 0.70  --    < > 0.90* 0.65 0.40  --   CREATININE  --  1.01  --   --   --  0.92  --    < > = values in this interval not displayed.    Estimated Creatinine Clearance: 84.4 mL/min (by C-G formula based on SCr of 0.92 mg/dL).  Assessment: 62 y.o. male with h/o DVT and AVR/MVR, on Coumadin pta. 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  Coumadin on hold for biopsy.  Pharmacy consulted to dose heparin. Heparin level therapeutic at 0.40 on heparin 1150 units/hr.  INR 1.27, Hg down to 9.2, plt wnl. No bleed documented.  Goal of Therapy:  Heparin level 0.3-0.7 units/ml Monitor platelets by anticoagulation protocol: Yes   Plan:  Continue heparin at 1150 units/hr Monitor daily heparin level and CBC, s/sx bleeding Coumadin on hold  Nicole Cella, RPh Clinical Pharmacist Pager: [phone] 11/11/2017 12:58 PM

## 2017-11-12 ENCOUNTER — Inpatient Hospital Stay (HOSPITAL_COMMUNITY): Payer: Medicare Other

## 2017-11-12 ENCOUNTER — Other Ambulatory Visit (HOSPITAL_COMMUNITY): Payer: Medicare Other

## 2017-11-12 DIAGNOSIS — Z79899 Other long term (current) drug therapy: Secondary | ICD-10-CM

## 2017-11-12 DIAGNOSIS — I4891 Unspecified atrial fibrillation: Secondary | ICD-10-CM

## 2017-11-12 DIAGNOSIS — R109 Unspecified abdominal pain: Secondary | ICD-10-CM

## 2017-11-12 DIAGNOSIS — C259 Malignant neoplasm of pancreas, unspecified: Secondary | ICD-10-CM

## 2017-11-12 DIAGNOSIS — R112 Nausea with vomiting, unspecified: Secondary | ICD-10-CM

## 2017-11-12 DIAGNOSIS — R634 Abnormal weight loss: Secondary | ICD-10-CM

## 2017-11-12 LAB — CBC
HCT: 28.7 % — ABNORMAL LOW (ref 39.0–52.0)
Hemoglobin: 9.4 g/dL — ABNORMAL LOW (ref 13.0–17.0)
MCH: 30.5 pg (ref 26.0–34.0)
MCHC: 32.8 g/dL (ref 30.0–36.0)
MCV: 93.2 fL (ref 78.0–100.0)
Platelets: 385 10*3/uL (ref 150–400)
RBC: 3.08 MIL/uL — ABNORMAL LOW (ref 4.22–5.81)
RDW: 13.9 % (ref 11.5–15.5)
WBC: 8.4 10*3/uL (ref 4.0–10.5)

## 2017-11-12 LAB — HEMOGLOBIN AND HEMATOCRIT, BLOOD
HEMATOCRIT: 29 % — AB (ref 39.0–52.0)
Hemoglobin: 9 g/dL — ABNORMAL LOW (ref 13.0–17.0)

## 2017-11-12 LAB — CEA: CEA: 219 ng/mL — ABNORMAL HIGH (ref 0.0–4.7)

## 2017-11-12 LAB — CANCER ANTIGEN 19-9: CAN 19-9: 322500 U/mL — AB (ref 0–35)

## 2017-11-12 LAB — PROTIME-INR
INR: 1.14
Prothrombin Time: 14.5 s (ref 11.4–15.2)

## 2017-11-12 LAB — HEPARIN LEVEL (UNFRACTIONATED): Heparin Unfractionated: 0.68 IU/mL (ref 0.30–0.70)

## 2017-11-12 MED ORDER — MIDAZOLAM HCL 2 MG/2ML IJ SOLN
INTRAMUSCULAR | Status: AC | PRN
  Administered 2017-11-12 (×2): 0.5 mg via INTRAVENOUS
  Administered 2017-11-12: 1 mg via INTRAVENOUS

## 2017-11-12 MED ORDER — MIDAZOLAM HCL 2 MG/2ML IJ SOLN
INTRAMUSCULAR | Status: AC
  Filled 2017-11-12: qty 4

## 2017-11-12 MED ORDER — HEPARIN (PORCINE) IN NACL 100-0.45 UNIT/ML-% IJ SOLN
1300.0000 [IU]/h | INTRAMUSCULAR | Status: DC
  Administered 2017-11-12: 1150 [IU]/h via INTRAVENOUS

## 2017-11-12 MED ORDER — PANCRELIPASE (LIP-PROT-AMYL) 12000-38000 UNITS PO CPEP
36000.0000 [IU] | ORAL_CAPSULE | Freq: Three times a day (TID) | ORAL | Status: DC
  Administered 2017-11-13 – 2017-11-16 (×10): 36000 [IU] via ORAL
  Filled 2017-11-12 (×10): qty 3

## 2017-11-12 MED ORDER — FENTANYL CITRATE (PF) 100 MCG/2ML IJ SOLN
INTRAMUSCULAR | Status: AC
  Filled 2017-11-12: qty 4

## 2017-11-12 MED ORDER — GELATIN ABSORBABLE 12-7 MM EX MISC
CUTANEOUS | Status: AC
  Filled 2017-11-12: qty 1

## 2017-11-12 MED ORDER — FENTANYL CITRATE (PF) 100 MCG/2ML IJ SOLN
INTRAMUSCULAR | Status: AC | PRN
  Administered 2017-11-12: 50 ug via INTRAVENOUS

## 2017-11-12 MED ORDER — WARFARIN SODIUM 5 MG PO TABS: 10.0000 mg | ORAL_TABLET | Freq: Once | ORAL | Status: DC

## 2017-11-12 MED ORDER — LIDOCAINE HCL (PF) 1 % IJ SOLN
INTRAMUSCULAR | Status: AC
  Filled 2017-11-12: qty 30

## 2017-11-12 MED ORDER — WARFARIN - PHARMACIST DOSING INPATIENT: Freq: Every day | Status: DC

## 2017-11-12 MED ORDER — SODIUM CHLORIDE 0.9 % IV SOLN
INTRAVENOUS | Status: AC | PRN
  Administered 2017-11-12: 10 mL/h via INTRAVENOUS

## 2017-11-12 NOTE — Sedation Documentation (Signed)
Patient is resting comfortably. 

## 2017-11-12 NOTE — Progress Notes (Addendum)
Patient back from IR. Alert and oriented, complaining of pain on biopsy site. Biopsy site - adhesive strips. We will continue to monitor.

## 2017-11-12 NOTE — Progress Notes (Signed)
Heparin drip @11 /5cc/hr was restarted per MD order. Will continue to monitor.

## 2017-11-12 NOTE — Care Management Note (Signed)
Case Management Note  Patient Details  Name: Khriz Liddy MRN: [id number] Date of Birth: Jul 27, 1956  Subjective/Objective:   Presents with abdominal pain, hx of hypertension, tobacco abuse, mechanical aortic and mitral valve replacement,  stroke in December 2018 4 and recently diagnosed with left lower extremity DVT/ coumadin.  Resides alone. PTA independent with ADL's, no DME usage.  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 NCM following for transitional care needs. Expected Discharge Date:                  Expected Discharge Plan:  Home/Self Care  In-House Referral:     Discharge planning Services  CM Consult  Post Acute Care Choice:    Choice offered to:     DME Arranged:    DME Agency:     HH Arranged:    HH Agency:     Status of Service:  In process, will continue to follow  If discussed at Long Length of Stay Meetings, dates discussed:    Additional Comments:  Sharin Mons, RN 11/12/2017, 7:24 PM

## 2017-11-12 NOTE — Progress Notes (Addendum)
ANTICOAGULATION CONSULT NOTE - Follow-Up  Pharmacy Consult for heparin Indication: mechanical valve, DVT  No Known Allergies  Patient Measurements: Height: 6' (182.9 cm) Weight: 160 lb 0.9 oz (72.6 kg) IBW/kg (Calculated) : 77.6 Heparin Dosing Weight: 72.6 kg  Vital Signs: Temp: 98.4 F (36.9 C) (04/01 0443) Temp Source: Oral (04/01 0443) BP: 118/86 (04/01 0443) Pulse Rate: 103 (04/01 0443)  Labs: Recent Labs    11/09/17 1202  11/10/17 0517 11/10/17 1528 11/11/17 0336 11/11/17 0733 11/12/17 0516  HGB  --    < > 9.5*  --   --  9.2* 9.4*  HCT  --   --  30.0*  --   --  28.5* 28.7*  PLT  --   --  306  --   --  304 385  LABPROT  --   --  20.2*  --  15.7*  --  14.5  INR  --   --  1.74  --  1.27  --  1.14  HEPARINUNFRC  --    < > 0.90* 0.65 0.40  --  0.68  CREATININE 1.01  --   --   --  0.92  --   --    < > = values in this interval not displayed.    Estimated Creatinine Clearance: 84.4 mL/min (by C-G formula based on SCr of 0.92 mg/dL).  Assessment: 62 y.o. male with h/o DVT and AVR/MVR, on Coumadin PTA. 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  Coumadin on hold for biopsy planned for 4/1.   Heparin stopped at 0620 this AM.    Goal of Therapy:  Heparin level 0.3-0.7 units/ml Monitor platelets by anticoagulation protocol: Yes   Plan:  Follow-up plan for restarting anticoag post procedure.  [employer], Pharm.D., BCPS Clinical Pharmacist Pager: [phone] Clinical phone for 11/12/2017 from 8:30-4:00 is [phone]. After 4pm, please call Main Rx [phone]) for assistance. 11/12/2017 10:09 AM   11/12/2017 2:54 PM Pt is now post biopsy procedure.  Called IR and spoke with Jeneen Rinks who relayed information from Dr. Kathlene Cote.  OK to restart heparin at 4pm today.  11/12/2017 3:22 PM Spoke with Dr. Tyrell Antonio as well.  No further procedures planned.  Will restart  Coumadin as well.  PLAN: Restart heparin at 1150 units/hr at 4pm. Daily heparin level and CBC while on heparin. Coumadin 10mg  PO x 1 tonight Daily INR  [employer], Pharm.D., BCPS Clinical Pharmacist Pager: [phone] Clinical phone for 11/12/2017 from 8:30-4:00 is [phone]. After 4pm, please call Main [facility] [phone]) for assistance. 11/12/2017 2:56 PM

## 2017-11-12 NOTE — Procedures (Signed)
Interventional Radiology Procedure Note  Procedure: US guided core biopsy of liver lesion  Complications: None  Estimated Blood Loss: < 10 mL  Findings: Hypoechoic lesion in right lobe measuring roughly 1.5 cm targeted.  18 G core biopsy obtained x 3 via 17 G needle.  Venetia Night. Kathlene Cote, M.D Pager:  [phone]

## 2017-11-12 NOTE — Progress Notes (Signed)
PROGRESS NOTE  Clevland Cork UUV:[id number] DOB: December 10, 1955 DOA: 11/07/2017 PCP: Kerin Perna, NP   LOS: 3 days   Brief Narrative / Interim history: 62 year old male with history of hypertension, tobacco abuse, mechanical aortic valve replacement in mid 1990s, prior CVA in 2018 as well as left lower extremity DVT in the setting of Subtherapeutic INR who presents with abdominal pain nausea and vomiting, he was found to have a pancreatic mass concerning for malignancy with liver metastasis.  Assessment & Plan: Principal Problem:   Abdominal pain Active Problems:   History of stroke   Hx of aortic valve replacement, mechanical   Hypertension   Smoker   Pancreatic mass   Rectal bleeding   Acute deep vein thrombosis (DVT) of left lower extremity (HCC)   Acute blood loss anemia   Malnutrition of moderate degree   Blood in stool   Abdominal pain in the setting of underlying pancreatic mass;  -Continue to monitor INR and CBCs through the weekend, hemoglobin 9.5 -CEA elevated to 19, CA 19-9 322,500 -For now symptomatic management, pain control -High concern for pancreatic malignancy -S/P Liver Biopsy 11-12-2017 -plan to resume heparin gtt at 4 PM.  -Oncology consulted.   History of mechanical Aortic and mitral valve replacement;  Resume  Heparin at 4 PM .  Resume coumadin.   Bright blood blood per rectum -Patient reports bright red blood with bowel movements for the last couple weeks -Hemoglobin overall is stable, continue heparin infusion, continue to hold Coumadin in preparation for biopsy on Monday -GI consulted, no need for colonoscopy or any other procedures at this point monitor hb.  He denies hematochezia.   Acute blood loss anemia -hb at 9. Follow trend.   Hypertension -continue with Norvasc and lisinopril.   Left lower extremity DVT -While on Coumadin, he has a history of being subtherapeutic which is probably why he had a DVT. -For now continue  heparin  Acute kidney injury -Resolved  Elevated LFTs -Likely due to liver metastases  DVT prophylaxis: Heparin infusion Code Status: Full code Family Communication: Multiple family Member.  Disposition Plan: will need to resume coumadin after biopsy   Consultants:   Gastroenterology  Interventional radiology  Procedures:   None   Antimicrobials:  None    Subjective: He is alert, denies chest pain,. Hematochezia. Denies dyspnea. Awaiting procedure to be done.   Objective: Vitals:   11/12/17 1315 11/12/17 1336 11/12/17 1419 11/12/17 1445  BP: (!) 124/93 123/81 122/80 119/77  Pulse: 91 87 88 96  Resp: (!) 22 17  14   Temp:      TempSrc:      SpO2: 100% 100% 100% 99%  Weight:      Height:        Intake/Output Summary (Last 24 hours) at 11/12/2017 1520 Last data filed at 11/12/2017 1419 Gross per 24 hour  Intake 464.31 ml  Output -  Net 464.31 ml   Filed Weights   11/07/17 2105 11/08/17 1623  Weight: 72.6 kg (160 lb) 72.6 kg (160 lb 0.9 oz)    Examination:  Constitutional; NAD ENMT: MMM Respiratory: CTA Cardiovascular:  S 1, S 2 RRR, Click mechanical valve.  Abdomen: Soft, Mild tender.  Skin: no rash.  Neurologic: non focal.   Data Reviewed: I have independently reviewed following labs and imaging studies   CBC: Recent Labs  Lab 11/07/17 2126  11/08/17 1312 11/09/17 0546 11/10/17 0517 11/11/17 0733 11/12/17 0516  WBC 9.2   < > 9.3 10.3 10.5 9.9  8.4  NEUTROABS 7.9*  --   --   --   --   --   --   HGB 10.1*   < > 10.2* 10.5* 9.5* 9.2* 9.4*  HCT 30.8*   < > 31.8* 31.6* 30.0* 28.5* 28.7*  MCV 93.1   < > 93.0 92.7 93.2 92.5 93.2  PLT 328   < > 265 279 306 304 385   < > = values in this interval not displayed.   Basic Metabolic Panel: Recent Labs  Lab 11/07/17 2126 11/08/17 0459 11/09/17 1202 11/11/17 0336  NA 134* 137 136 136  K 3.4* 3.9 4.1 3.9  CL 98* 102 102 101  CO2 24 24 22 26   GLUCOSE 289* 116* 109* 116*  BUN 16 16 11 12    CREATININE 1.55* 1.16 1.01 0.92  CALCIUM 9.0 9.0 8.9 8.6*   GFR: Estimated Creatinine Clearance: 84.4 mL/min (by C-G formula based on SCr of 0.92 mg/dL). Liver Function Tests: Recent Labs  Lab 11/07/17 2126 11/09/17 1202 11/11/17 0336  AST 54* 60* 41  ALT 56 50 42  ALKPHOS 403* 393* 369*  BILITOT 1.0 1.3* 1.5*  PROT 6.7 6.4* 6.0*  ALBUMIN 3.6 3.4* 2.9*   Recent Labs  Lab 11/07/17 2126  LIPASE 33   No results for input(s): AMMONIA in the last 168 hours. Coagulation Profile: Recent Labs  Lab 11/07/17 2255 11/09/17 0546 11/10/17 0517 11/11/17 0336 11/12/17 0516  INR 1.74 1.94 1.74 1.27 1.14   Cardiac Enzymes: No results for input(s): CKTOTAL, CKMB, CKMBINDEX, TROPONINI in the last 168 hours. BNP (last 3 results) No results for input(s): PROBNP in the last 8760 hours. HbA1C: No results for input(s): HGBA1C in the last 72 hours. CBG: No results for input(s): GLUCAP in the last 168 hours. Lipid Profile: No results for input(s): CHOL, HDL, LDLCALC, TRIG, CHOLHDL, LDLDIRECT in the last 72 hours. Thyroid Function Tests: No results for input(s): TSH, T4TOTAL, FREET4, T3FREE, THYROIDAB in the last 72 hours. Anemia Panel: No results for input(s): VITAMINB12, FOLATE, FERRITIN, TIBC, IRON, RETICCTPCT in the last 72 hours. Urine analysis:    Component Value Date/Time   COLORURINE AMBER (A) 11/08/2017 0338   APPEARANCEUR HAZY (A) 11/08/2017 0338   LABSPEC >1.046 (H) 11/08/2017 0338   PHURINE 5.0 11/08/2017 0338   GLUCOSEU NEGATIVE 11/08/2017 0338   HGBUR SMALL (A) 11/08/2017 0338   BILIRUBINUR NEGATIVE 11/08/2017 0338   KETONESUR NEGATIVE 11/08/2017 0338   PROTEINUR 100 (A) 11/08/2017 0338   UROBILINOGEN 1.0 07/10/2013 1401   NITRITE NEGATIVE 11/08/2017 0338   LEUKOCYTESUR NEGATIVE 11/08/2017 0338   Sepsis Labs: Invalid input(s): PROCALCITONIN, LACTICIDVEN  No results found for this or any previous visit (from the past 240 hour(s)).    Radiology Studies: No  results found.   Scheduled Meds: . amLODipine  5 mg Oral Q supper  . feeding supplement (ENSURE ENLIVE)  237 mL Oral BID BM  . fentaNYL      . hydrocortisone  25 mg Rectal BID  . lidocaine (PF)      . lisinopril  20 mg Oral Q supper  . midazolam      . pantoprazole  40 mg Oral BID  . polyethylene glycol  17 g Oral q morning - 10a  . pravastatin  20 mg Oral Q supper   Continuous Infusions: . heparin      Niel Hummer, MD.  [facility] Pager [phone]  If 7PM-7AM, please contact night-coverage [email or web address] Password [email or web address] 11/12/2017, 3:20 PM

## 2017-11-12 NOTE — Progress Notes (Signed)
Patient scheduled for liver biopsy at 10 AM per IR staff. At 10:20 o'clock patient and his wife very upset about him being NPO, and still he waiting for biopsy to be done. They said the same happened last Friday and the patient was NPO until after 7 PM. Writer called IR and explain to their staff their frustration and they said they had an emergency and his biopsy was rescheduled for 12:30 - 13:00 o'clock thi afternoon. MD notified and also the family. MD called IR and ask to do the precedure earlier than 12:30 if it is possible. Will continue to monitor.

## 2017-11-12 NOTE — Consult Note (Signed)
Referral MD  Reason for Referral: metastatic pancreatic cancer - hepatic mets                                     DVT in the LEFT leg  Chief Complaint  Patient presents with  . Abdominal Pain  : I had abdominal pain.  HPI: Jeff [name] is a very nice 62 year old African-American male.  He has a history of aortic valve and mitral valve replacement.  He has had rheumatic fever when he was a child.  He has atrial fibrillation.  He had a cerebrovascular accident.  He is now on Coumadin.  He is lost about 20 pounds.  He has been having some nausea and vomiting.  Has had no bleeding.  His daughter brought him to the hospital because of abdominal pain.  He was admitted on 11/08/2017.  At that time, he was seen by GI.  They did not recommend a colonoscopy.  He did have a CT scan of the abdomen and pelvis.  Surprisingly, this showed a large pancreatic mass with multiple hepatic metastasis.  The pancreatic mass measured 4.7 x 6.3 cm.  It was in the distal body and tail of the pancreas.  The spleen appeared normal.  He had multiple liver metastases.  Largest measure 4 x 5 cm.  He had some slightly enlarged porta hepatis nodes.  The vascular structures all appear to be patent.  He had tumor markers done.  His CEA was 219.  His CA 19-9 was 322,500.  He did undergo a liver biopsy today.  He has been on Coumadin since he had the strokes.  He says that he had a blood clot in his left lower leg.  This appeared to have been while he was on Coumadin.  I think this was probably an indicator that he had underlying malignancy.  He is on heparin right now as he is having invasive procedures.  His sister is with this.  She is very nice.  He smokes marijuana.  He does not smoke cigarettes.  He does drink on occasion.  Says he drinks wine coolers.  There is no family history of malignancy.  He was a Primary school teacher.  He has had no diarrhea.  He has had no dysuria.  When he was admitted, his  hemoglobin was 8.2.  Hemoglobin 9.7.  Platelet count 271,000.  His electrolytes showed a sodium 134.  Potassium 3 at 3.4.  Glucose 289.  Creatinine 1.55.  Calcium 9.  Albumin 3.6.  His alkaline phosphatase was 403.  He apparently had a acute stroke in December 2018.  A brain scan excuse me brain MRI, showed a subtle 12 mm focus within the subcortical white matter of the left frontoparietal region.  He had a remote bilateral cerebral and cerebellar infarcts.  Overall, I said his performance status is ECOG 1.     Past Medical History:  Diagnosis Date  . Acute ischemic stroke ([place])   . H/O mitral valve replacement with mechanical valve   . Heart disease   . High cholesterol   . History of CVA (cerebrovascular accident)    02/2011  . Hx of aortic valve replacement, mechanical   . HX: long term anticoagulant use   . Stroke [facility])   :  Past Surgical History:  Procedure Laterality Date  . CARDIAC VALVE REPLACEMENT    :   Current Facility-Administered Medications:  .  acetaminophen (TYLENOL) tablet 650 mg, 650 mg, Oral, Q6H PRN **OR** acetaminophen (TYLENOL) suppository 650 mg, 650 mg, Rectal, Q6H PRN, Rise Patience, MD .  amLODipine (NORVASC) tablet 5 mg, 5 mg, Oral, Q supper, Rise Patience, MD, 5 mg at 11/11/17 1615 .  feeding supplement (ENSURE ENLIVE) (ENSURE ENLIVE) liquid 237 mL, 237 mL, Oral, BID BM, Rise Patience, MD, 237 mL at 11/12/17 1414 .  fentaNYL (SUBLIMAZE) 100 MCG/2ML injection, , , ,  .  heparin ADULT infusion 100 units/mL (25000 units/261m sodium chloride 0.45%), 1,150 Units/hr, Intravenous, Continuous, YAletta Edouard MD, Last Rate: 11.5 mL/hr at 11/12/17 1617, 1,150 Units/hr at 11/12/17 1617 .  hydrocortisone (ANUSOL-HC) suppository 25 mg, 25 mg, Rectal, BID, OArta Silence MD, 25 mg at 11/12/17 1417 .  lidocaine (PF) (XYLOCAINE) 1 % injection, , , ,  .  [START ON 11/13/2017] lipase/protease/amylase (CREON) capsule 36,000 Units, 36,000 Units,  Oral, TID AC, Alrick Cubbage, PRudell Cobb MD .  lisinopril (PRINIVIL,ZESTRIL) tablet 20 mg, 20 mg, Oral, Q supper, KRise Patience MD, 20 mg at 11/11/17 1614 .  midazolam (VERSED) 2 MG/2ML injection, , , ,  .  morphine 4 MG/ML injection 1 mg, 1 mg, Intravenous, Q2H PRN, Blount, Xenia T, NP, 1 mg at 11/12/17 1415 .  ondansetron (ZOFRAN) tablet 4 mg, 4 mg, Oral, Q6H PRN **OR** ondansetron (ZOFRAN) injection 4 mg, 4 mg, Intravenous, Q6H PRN, KRise Patience MD .  pantoprazole (PROTONIX) EC tablet 40 mg, 40 mg, Oral, BID, Hammons, Kimberly B, RPH, 40 mg at 11/12/17 1415 .  polyethylene glycol (MIRALAX / GLYCOLAX) packet 17 g, 17 g, Oral, q morning - 10a, OArta Silence MD, 17 g at 11/12/17 1414 .  pravastatin (PRAVACHOL) tablet 20 mg, 20 mg, Oral, Q supper, KRise Patience MD, 20 mg at 11/11/17 1615:  . amLODipine  5 mg Oral Q supper  . feeding supplement (ENSURE ENLIVE)  237 mL Oral BID BM  . fentaNYL      . hydrocortisone  25 mg Rectal BID  . lidocaine (PF)      . [START ON 11/13/2017] lipase/protease/amylase  36,000 Units Oral TID AC  . lisinopril  20 mg Oral Q supper  . midazolam      . pantoprazole  40 mg Oral BID  . polyethylene glycol  17 g Oral q morning - 10a  . pravastatin  20 mg Oral Q supper  :  No Known Allergies:  Family History  Problem Relation Age of Onset  . Diabetes Mother        Died before her 521birthday  . Heart attack Brother   :  Social History   Socioeconomic History  . Marital status: Single    Spouse name: Not on file  . Number of children: Not on file  . Years of education: Not on file  . Highest education level: Not on file  Occupational History  . Not on file  Social Needs  . Financial resource strain: Not on file  . Food insecurity:    Worry: Not on file    Inability: Not on file  . Transportation needs:    Medical: Not on file    Non-medical: Not on file  Tobacco Use  . Smoking status: Current Every Day Smoker    Packs/day: 0.05     Types: Cigarettes  . Smokeless tobacco: Never Used  . Tobacco comment: smoke two per day  Substance and Sexual Activity  . Alcohol use: Yes  Comment: wine coolers prn  . Drug use: Yes    Frequency: 7.0 times per week    Types: Marijuana    Comment: smoke when he can get it  . Sexual activity: Yes  Lifestyle  . Physical activity:    Days per week: Not on file    Minutes per session: Not on file  . Stress: Not on file  Relationships  . Social connections:    Talks on phone: Not on file    Gets together: Not on file    Attends religious service: Not on file    Active member of club or organization: Not on file    Attends meetings of clubs or organizations: Not on file    Relationship status: Not on file  . Intimate partner violence:    Fear of current or ex partner: Not on file    Emotionally abused: Not on file    Physically abused: Not on file    Forced sexual activity: Not on file  Other Topics Concern  . Not on file  Social History Narrative  . Not on file  :  Review of Systems  Constitutional: Positive for weight loss.  HENT: Negative.   Eyes: Negative.   Respiratory: Positive for shortness of breath.   Cardiovascular: Positive for palpitations and leg swelling.  Gastrointestinal: Positive for abdominal pain and nausea.  Genitourinary: Positive for urgency.  Musculoskeletal: Positive for joint pain.  Skin: Negative.   Neurological: Positive for focal weakness.  Endo/Heme/Allergies: Negative.   Psychiatric/Behavioral: Negative.      Exam: Fairly well-developed and well-nourished African-American male.  Head neck exam shows no ocular or oral lesions.  He has no scleral icterus.  There is no adenopathy in the neck.  Lungs are clear bilaterally.  Cardiac exam shows a systolic clicks from his heart valve.  He has an erratic rhythm consistent with atrial fibrillation.  Abdomen is soft.  Bowel sounds present.  There is no obvious fluid wave.  He has no guarding or  rebound tenderness.  Extremities shows some slight swelling in the left lower leg.  He has good range of motion of his joints.  Neurological exam showed no focal neurological deficits. Patient Vitals for the past 24 hrs:  BP Temp Temp src Pulse Resp SpO2  11/12/17 1445 119/77 - - 96 14 99 %  11/12/17 1419 122/80 98.5 F (36.9 C) Oral 88 18 100 %  11/12/17 1336 123/81 - - 87 17 100 %  11/12/17 1315 (!) 124/93 - - 91 (!) 22 100 %  11/12/17 1310 129/89 - - 90 15 100 %  11/12/17 1305 124/80 - - 92 15 100 %  11/12/17 1300 (!) 133/93 - - 90 18 100 %  11/12/17 1246 125/88 - - 89 19 100 %  11/12/17 1223 123/86 - - 90 15 100 %  11/12/17 0443 118/86 98.4 F (36.9 C) Oral (!) 103 18 98 %  11/11/17 2119 114/68 99.5 F (37.5 C) Oral 97 17 100 %     Recent Labs    11/11/17 0733 11/12/17 0516  WBC 9.9 8.4  HGB 9.2* 9.4*  HCT 28.5* 28.7*  PLT 304 385   Recent Labs    11/11/17 0336  NA 136  K 3.9  CL 101  CO2 26  GLUCOSE 116*  BUN 12  CREATININE 0.92  CALCIUM 8.6*    Blood smear review: None  Pathology: Pending    Assessment and Plan: Jeff [name] is a really nice  61 year old African-American male.  He has clinically metastatic pancreatic cancer.  I have to believe that this will be metastatic adenocarcinoma given the incredibly elevated CA 19-9.  We will await the biopsy.  Hopefully, there will be enough tissue on the biopsy so we can send off for genetic analysis.  I think that he would be a candidate for systemic therapy.  I talked to he and his sister for about an hour.  I explained to them that this is already stage IV pancreatic cancer.  With stage IV pancreatic cancer, this can be treated but not cured.  I told them that without therapy, or if therapy did not work, we probably would be looking at no more than 3-4 months.  He would like to be treated.  I think this is reasonable.  He seems to have a decent performance status.  He will need a Port-A-Cath replaced.  We will  get this while he is in the hospital is on heparin.  I have to believe that the thromboembolic disease in his legs are a direct consequence of his malignancy.  As such, I do not recommend that he go on to Coumadin.  I believe that he will clearly need Lovenox as an outpatient.  He definitely is at high risk for thromboembolic disease given his malignancy.  I would use 1 of the new oral anticoagulants but given that he has mechanical heart valve, I cannot use these.  I talked to he and his sister about Lovenox.  He has been doing Lovenox.  Maybe, we can do daily Lovenox instead of twice daily dosing of Lovenox.  Over half the time was spent face-to-face with Jeff [name] and his sister.  Answered all their questions.  I suspect that if we do chemotherapy, we probably would use Gemzar/Abraxane.  I am assured that he would be a good candidate for FOLFIRINOX.    We will have to see what his molecular studies show.  Maybe, we will get lucky and he will have a high TMB and we could consider immunotherapy.  We will follow along.  I would be nice to get a CT scan of the chest while he is an inpatient to see if there is any metastatic disease.  I very much appreciated see Jeff [name].  He is a good guy.  He has a strong faith.  As such, he will enjoyed coming to our office and getting wonderful care by my compassionate nurses.  Lattie Haw, MD  2 Timothy 1:7

## 2017-11-13 ENCOUNTER — Inpatient Hospital Stay (HOSPITAL_COMMUNITY): Payer: Medicare Other

## 2017-11-13 ENCOUNTER — Encounter (HOSPITAL_COMMUNITY): Payer: Self-pay | Admitting: Interventional Radiology

## 2017-11-13 DIAGNOSIS — I82402 Acute embolism and thrombosis of unspecified deep veins of left lower extremity: Secondary | ICD-10-CM

## 2017-11-13 DIAGNOSIS — I824Z1 Acute embolism and thrombosis of unspecified deep veins of right distal lower extremity: Secondary | ICD-10-CM

## 2017-11-13 DIAGNOSIS — I2699 Other pulmonary embolism without acute cor pulmonale: Secondary | ICD-10-CM

## 2017-11-13 HISTORY — PX: IR FLUORO GUIDE PORT INSERTION RIGHT: IMG5741

## 2017-11-13 HISTORY — PX: IR US GUIDE VASC ACCESS RIGHT: IMG2390

## 2017-11-13 LAB — COMPREHENSIVE METABOLIC PANEL
ALBUMIN: 2.9 g/dL — AB (ref 3.5–5.0)
ALK PHOS: 412 U/L — AB (ref 38–126)
ALT: 52 U/L (ref 17–63)
AST: 51 U/L — AB (ref 15–41)
Anion gap: 12 (ref 5–15)
BILIRUBIN TOTAL: 0.9 mg/dL (ref 0.3–1.2)
BUN: 16 mg/dL (ref 6–20)
CALCIUM: 8.7 mg/dL — AB (ref 8.9–10.3)
CO2: 24 mmol/L (ref 22–32)
Chloride: 100 mmol/L — ABNORMAL LOW (ref 101–111)
Creatinine, Ser: 0.95 mg/dL (ref 0.61–1.24)
GFR calc Af Amer: >60 mL/min (ref >60)
GLUCOSE: 147 mg/dL — AB (ref 65–99)
Potassium: 4.1 mmol/L (ref 3.5–5.1)
Sodium: 136 mmol/L (ref 135–145)
TOTAL PROTEIN: 6.4 g/dL — AB (ref 6.5–8.1)

## 2017-11-13 LAB — ECHOCARDIOGRAM COMPLETE
Height: 72 in
Weight: 2560.86 oz

## 2017-11-13 LAB — FERRITIN: FERRITIN: 976 ng/mL — AB (ref 24–336)

## 2017-11-13 LAB — CBC
HEMATOCRIT: 28 % — AB (ref 39.0–52.0)
HEMOGLOBIN: 8.7 g/dL — AB (ref 13.0–17.0)
MCH: 29.2 pg (ref 26.0–34.0)
MCHC: 31.1 g/dL (ref 30.0–36.0)
MCV: 94 fL (ref 78.0–100.0)
Platelets: 411 10*3/uL — ABNORMAL HIGH (ref 150–400)
RBC: 2.98 MIL/uL — ABNORMAL LOW (ref 4.22–5.81)
RDW: 13.7 % (ref 11.5–15.5)
WBC: 9.9 10*3/uL (ref 4.0–10.5)

## 2017-11-13 LAB — IRON AND TIBC
Iron: 25 ug/dL — ABNORMAL LOW (ref 45–182)
Saturation Ratios: 12 % — ABNORMAL LOW (ref 17.9–39.5)
TIBC: 204 ug/dL — ABNORMAL LOW (ref 250–450)
UIBC: 179 ug/dL

## 2017-11-13 LAB — PROTIME-INR
INR: 1.09
Prothrombin Time: 14 seconds (ref 11.4–15.2)

## 2017-11-13 LAB — HEPARIN LEVEL (UNFRACTIONATED)
HEPARIN UNFRACTIONATED: 0.26 [IU]/mL — AB (ref 0.30–0.70)
Heparin Unfractionated: 0.25 IU/mL — ABNORMAL LOW (ref 0.30–0.70)

## 2017-11-13 MED ORDER — HEPARIN SOD (PORK) LOCK FLUSH 100 UNIT/ML IV SOLN
INTRAVENOUS | Status: AC
  Filled 2017-11-13: qty 5

## 2017-11-13 MED ORDER — MIDAZOLAM HCL 2 MG/2ML IJ SOLN
INTRAMUSCULAR | Status: AC
  Filled 2017-11-13: qty 4

## 2017-11-13 MED ORDER — LIDOCAINE HCL 1 % IJ SOLN: INTRAMUSCULAR | Status: AC | PRN

## 2017-11-13 MED ORDER — FENTANYL CITRATE (PF) 100 MCG/2ML IJ SOLN
INTRAMUSCULAR | Status: AC
  Filled 2017-11-13: qty 4

## 2017-11-13 MED ORDER — SODIUM CHLORIDE 0.9 % IV SOLN
750.0000 mg | Freq: Once | INTRAVENOUS | Status: AC
  Administered 2017-11-13: 750 mg via INTRAVENOUS
  Filled 2017-11-13: qty 15

## 2017-11-13 MED ORDER — HEPARIN (PORCINE) IN NACL 100-0.45 UNIT/ML-% IJ SOLN
1400.0000 [IU]/h | INTRAMUSCULAR | Status: AC
  Administered 2017-11-13: 1300 [IU]/h via INTRAVENOUS
  Administered 2017-11-14 – 2017-11-15 (×2): 1450 [IU]/h via INTRAVENOUS
  Filled 2017-11-13 (×3): qty 250

## 2017-11-13 MED ORDER — SODIUM CHLORIDE 0.9 % IV SOLN
INTRAVENOUS | Status: AC | PRN
  Administered 2017-11-13: 10 mL/h via INTRAVENOUS

## 2017-11-13 MED ORDER — FENTANYL CITRATE (PF) 100 MCG/2ML IJ SOLN
INTRAMUSCULAR | Status: AC | PRN
  Administered 2017-11-13: 50 ug via INTRAVENOUS
  Administered 2017-11-13: 25 ug via INTRAVENOUS

## 2017-11-13 MED ORDER — LIDOCAINE HCL 1 % IJ SOLN
INTRAMUSCULAR | Status: AC
  Filled 2017-11-13: qty 20

## 2017-11-13 MED ORDER — HEPARIN SOD (PORK) LOCK FLUSH 100 UNIT/ML IV SOLN
INTRAVENOUS | Status: AC | PRN
  Administered 2017-11-13: 500 [IU] via INTRAVENOUS

## 2017-11-13 MED ORDER — LIDOCAINE HCL 1 % IJ SOLN
INTRAMUSCULAR | Status: AC | PRN
  Administered 2017-11-13: 10 mL

## 2017-11-13 MED ORDER — MIDAZOLAM HCL 2 MG/2ML IJ SOLN
INTRAMUSCULAR | Status: AC | PRN
  Administered 2017-11-13: 1 mg via INTRAVENOUS
  Administered 2017-11-13 (×2): 0.5 mg via INTRAVENOUS

## 2017-11-13 MED ORDER — IOPAMIDOL (ISOVUE-300) INJECTION 61%
INTRAVENOUS | Status: AC
  Administered 2017-11-13: 75 mL
  Filled 2017-11-13: qty 100

## 2017-11-13 NOTE — Sedation Documentation (Signed)
Patient is resting comfortably. 

## 2017-11-13 NOTE — Progress Notes (Signed)
Anticoagulation Update  Per IR consult, ok to resume heparin at 11am. Will restart heparin at 1300 units/hr. Heparin level in 8 hours after restart. Heparin level and CBC daily while on heparin.  Have adjusted orders and communicated this information to [place], Therapist, sports.  [employer], Pharm.D., BCPS Clinical Pharmacist Pager: [phone] Clinical phone for 11/13/2017 from 8:30-4:00 is [phone]. After 4pm, please call Main [facility] [phone]) for assistance. 11/13/2017 10:42 AM

## 2017-11-13 NOTE — Progress Notes (Signed)
Benefits check in process for Lovenox 120mg  daily. NCM to f/u with results. Whitman Hero RN,BSN,CM [phone]

## 2017-11-13 NOTE — Progress Notes (Signed)
  Echocardiogram 2D Echocardiogram has been performed.  Jeff [name] 11/13/2017, 12:02 PM

## 2017-11-13 NOTE — Progress Notes (Signed)
RN paged that vascular lab stated pt had a DVT in the right leg. Pt already dx'd with DVT in left leg. Pt already on a Heparin drip, so no new tx needed.  KJKG, NP [facility]

## 2017-11-13 NOTE — Progress Notes (Signed)
Writer was notified by vascular lab about patient being positive for DVT in right leg. MD was informed. Will continue to monitor.

## 2017-11-13 NOTE — Procedures (Signed)
RIJV PAC SVC RA EBL 0 Comp 0 

## 2017-11-13 NOTE — Progress Notes (Signed)
He has left leg DVT involving popliteal vein and below by [place] at our office on 10/26/2017. He was subtherapeutic on Coumadin when he developed DVT.     Adrian Prows, MD 11/13/2017, 10:51 AM [facility]. [place] Pager: [phone] Office: [phone] If no answer: Cell:  [phone]

## 2017-11-13 NOTE — Progress Notes (Signed)
   Roselind Messier, RN        #  5.  PATIENT HAS : MEDICAID [place] ACCESS       EFF-DATE 07-14-2017       CO-PAY- $ 3.80 FOR EACH RX   Previous Messages    ----- Message -----  From: Cecille Rubin, RN  Sent: 11/13/2017  9:45 AM  To: [facility] Ccm Case Mgr Assistant  Subject: benefits check                  PLEASE CHECK COPAY Lovenox 120mg  SQ q24h. Thanks      Whitman Hero RN,BSN,CM

## 2017-11-13 NOTE — Progress Notes (Signed)
Bilateral lower extremity venous duplex completed. Positive for an acute right popliteal DVT and peroneal DVT. There is no evidence of a superficial thrombosis or Baker's cyst. Positive for an age indeterminate popliteal and peroneal DVT. There is no evidence of a superficial thrombosis or Baker's cyst. Toma Copier, RVS 11/13/2017 5:56 PM

## 2017-11-13 NOTE — Progress Notes (Addendum)
PROGRESS NOTE  Jeff [name] SWF:[id number] DOB: 02/21/1956 DOA: 11/07/2017 PCP: Kerin Perna, NP   LOS: 4 days   Brief Narrative / Interim history: 62 year old male with history of hypertension, tobacco abuse, mechanical aortic valve replacement in mid 1990s, prior CVA in 2018 as well as left lower extremity DVT in the setting of Subtherapeutic INR who presents with abdominal pain nausea and vomiting, he was found to have a pancreatic mass concerning for malignancy with liver metastasis. Underwent liver biopsy 4-01. Results pending. Underwent port catheter placement 4-02. He had CT chest that showed Bilateral PE. He is IV heparin. ECHO pending.   Assessment & Plan: Principal Problem:   Abdominal pain Active Problems:   History of stroke   Hx of aortic valve replacement, mechanical   Hypertension   Smoker   Pancreatic mass   Rectal bleeding   Acute deep vein thrombosis (DVT) of left lower extremity (HCC)   Acute blood loss anemia   Malnutrition of moderate degree   Blood in stool   Pancreas cancer (HCC)   Abdominal pain in the setting of underlying pancreatic mass;  -Continue to monitor INR and CBCs through the weekend, hemoglobin 9.5 -CEA elevated to 19, CA 19-9 322,500 -For now symptomatic management, pain control -High concern for pancreatic malignancy -S/P Liver Biopsy 11-12-2017 -Oncology consulted, Appreciate Dr Marin Olp help/  -patient underwent Port cath placement today.   Acute Bilateral  lobar and subsegmental PE/  Patient already on IV heparin.  Discussed with Dr Marin Olp, will get ECHO, and LE [place].  Patient will need Lovenox at discharge. Continue with IV heparin today  Monitor BP.  Follow ECHO if right side heart Strain , will need CCM consult.   History of mechanical Aortic and mitral valve replacement;  Back on heparin Gtt.  Discussed with Dr Marin Olp, ok to use lovenox in mechanical valve.   Bright blood blood per rectum -Patient reports bright red  blood with bowel movements for the last couple weeks -Hemoglobin overall is stable, continue heparin infusion, continue to hold Coumadin in preparation for biopsy on Monday -GI consulted, no need for colonoscopy or any other procedures at this point monitor hb.  He denies hematochezia.   Acute blood loss anemia -hb at 8-.7. Follow trend.  -to get IV iron today.   Hypertension -hold lisinopril and norvasc, now with diagnosis of PE. Monitor BP.   Left lower extremity DVT -While on Coumadin, he has a history of being subtherapeutic which is probably why he had a DVT. -For now continue heparin  Acute kidney injury -Resolved  Elevated LFTs -Likely due to liver metastases  DVT prophylaxis: Heparin infusion Code Status: Full code Family Communication: Multiple family Member.  Disposition Plan: will need to resume coumadin after biopsy   Consultants:   Gastroenterology  Interventional radiology  Procedures:   None   Antimicrobials:  None    Subjective: He denies chest pain or dyspnea.  No blood in the stool.   Objective: Vitals:   11/13/17 1014 11/13/17 1020 11/13/17 1054 11/13/17 1155  BP: (!) 121/100 (!) 121/100 (!) 131/96 117/82  Pulse: 93 93 93 91  Resp: 20 17 18 18   Temp:      TempSrc:      SpO2: 100% 99% 100% 100%  Weight:      Height:        Intake/Output Summary (Last 24 hours) at 11/13/2017 1415 Last data filed at 11/13/2017 1048 Gross per 24 hour  Intake 753.24 ml  Output 250  ml  Net 503.24 ml   Filed Weights   11/07/17 2105 11/08/17 1623  Weight: 72.6 kg (160 lb) 72.6 kg (160 lb 0.9 oz)    Examination:  Constitutional; NAD ENMT: MMM Respiratory: CTA Cardiovascular: S 1, S 2 RRR, Click mechanical valve.  Abdomen: Soft, nt , nd Skin: No rash  Neurologic: non focal.   Data Reviewed: I have independently reviewed following labs and imaging studies   CBC: Recent Labs  Lab 11/07/17 2126  11/09/17 0546 11/10/17 0517 11/11/17 0733  11/12/17 0516 11/12/17 2125 11/13/17 0557  WBC 9.2   < > 10.3 10.5 9.9 8.4  --  9.9  NEUTROABS 7.9*  --   --   --   --   --   --   --   HGB 10.1*   < > 10.5* 9.5* 9.2* 9.4* 9.0* 8.7*  HCT 30.8*   < > 31.6* 30.0* 28.5* 28.7* 29.0* 28.0*  MCV 93.1   < > 92.7 93.2 92.5 93.2  --  94.0  PLT 328   < > 279 306 304 385  --  411*   < > = values in this interval not displayed.   Basic Metabolic Panel: Recent Labs  Lab 11/07/17 2126 11/08/17 0459 11/09/17 1202 11/11/17 0336 11/13/17 0557  NA 134* 137 136 136 136  K 3.4* 3.9 4.1 3.9 4.1  CL 98* 102 102 101 100*  CO2 24 24 22 26 24   GLUCOSE 289* 116* 109* 116* 147*  BUN 16 16 11 12 16   CREATININE 1.55* 1.16 1.01 0.92 0.95  CALCIUM 9.0 9.0 8.9 8.6* 8.7*   GFR: Estimated Creatinine Clearance: 81.7 mL/min (by C-G formula based on SCr of 0.95 mg/dL). Liver Function Tests: Recent Labs  Lab 11/07/17 2126 11/09/17 1202 11/11/17 0336 11/13/17 0557  AST 54* 60* 41 51*  ALT 56 50 42 52  ALKPHOS 403* 393* 369* 412*  BILITOT 1.0 1.3* 1.5* 0.9  PROT 6.7 6.4* 6.0* 6.4*  ALBUMIN 3.6 3.4* 2.9* 2.9*   Recent Labs  Lab 11/07/17 2126  LIPASE 33   No results for input(s): AMMONIA in the last 168 hours. Coagulation Profile: Recent Labs  Lab 11/09/17 0546 11/10/17 0517 11/11/17 0336 11/12/17 0516 11/13/17 0557  INR 1.94 1.74 1.27 1.14 1.09   Cardiac Enzymes: No results for input(s): CKTOTAL, CKMB, CKMBINDEX, TROPONINI in the last 168 hours. BNP (last 3 results) No results for input(s): PROBNP in the last 8760 hours. HbA1C: No results for input(s): HGBA1C in the last 72 hours. CBG: No results for input(s): GLUCAP in the last 168 hours. Lipid Profile: No results for input(s): CHOL, HDL, LDLCALC, TRIG, CHOLHDL, LDLDIRECT in the last 72 hours. Thyroid Function Tests: No results for input(s): TSH, T4TOTAL, FREET4, T3FREE, THYROIDAB in the last 72 hours. Anemia Panel: Recent Labs    11/13/17 0557  FERRITIN 976*  TIBC 204*  IRON  25*   Urine analysis:    Component Value Date/Time   COLORURINE AMBER (A) 11/08/2017 0338   APPEARANCEUR HAZY (A) 11/08/2017 0338   LABSPEC >1.046 (H) 11/08/2017 0338   PHURINE 5.0 11/08/2017 0338   GLUCOSEU NEGATIVE 11/08/2017 0338   HGBUR SMALL (A) 11/08/2017 0338   BILIRUBINUR NEGATIVE 11/08/2017 0338   KETONESUR NEGATIVE 11/08/2017 0338   PROTEINUR 100 (A) 11/08/2017 0338   UROBILINOGEN 1.0 07/10/2013 1401   NITRITE NEGATIVE 11/08/2017 0338   LEUKOCYTESUR NEGATIVE 11/08/2017 0338   Sepsis Labs: Invalid input(s): PROCALCITONIN, LACTICIDVEN  No results found for this or any  previous visit (from the past 240 hour(s)).    Radiology Studies: Ct Chest W Contrast  Result Date: 11/13/2017 CLINICAL DATA:  Evaluate for metastatic disease.  Pancreas neoplasm. EXAM: CT CHEST WITH CONTRAST TECHNIQUE: Multidetector CT imaging of the chest was performed during intravenous contrast administration. CONTRAST:  66mL ISOVUE-300 IOPAMIDOL (ISOVUE-300) INJECTION 61% COMPARISON:  CT chest 4/23/5 FINDINGS: Cardiovascular: Previous median sternotomy and aortic and mitral valve replacement. The ascending thoracic aorta Measures 4.9 cm in diameter, image 76/3. Previously 4.4 cm. Large filling defect within the right lower lobar pulmonary artery and its main branches identified compatible with acute embolus. There are filling defects within the lingular pulmonary artery as well as segmental branches of the left lower lobe pulmonary artery. Mediastinum/Nodes: The trachea appears patent and is midline. Normal appearance of the esophagus. No enlarged mediastinal or hilar lymph nodes. Lungs/Pleura: No pleural effusion. No airspace consolidation atelectasis or pneumothorax. 5 mm perifissural nodule in the right lower lobe is new from previous exam image 75/4. Perifissural nodule within the left lower lobe measures 5 mm, image 74/4. Right upper lobe pulmonary artery measures 5 mm, image 58/4. New from previous exam.  Upper Abdomen: Extensive liver metastases are again noted as seen on 11/07/2017. Not significantly changed. No acute abnormality identified. Musculoskeletal: No chest wall abnormality. No acute or significant osseous findings. IMPRESSION: 1. There are few small nonspecific pulmonary nodules. The largest is in the right upper lobe measuring 5 mm. Not identified on previous chest CT from 12/05/03. 2. Exam positive for acute bilateral lobar and segmental pulmonary emboli. Critical Value/emergent results were called by telephone at the time of interpretation on 11/13/2017 at 8:46 am to Dr. Tyrell Antonio, who verbally acknowledged these results. 3.  Aortic Atherosclerosis (ICD10-I70.0). 4. Liver metastasis as noted on 03/27/noted. 5. Ascending thoracic aortic aneurysm. Recommend semi-annual imaging followup by CTA or MRA and referral to cardiothoracic surgery if not already obtained. This recommendation follows 2010 ACCF/AHA/AATS/ACR/ASA/SCA/SCAI/SIR/STS/SVM Guidelines for the Diagnosis and Management of Patients With Thoracic Aortic Disease. Circulation. 2010; 121: M578-I696 Electronically Signed   By: Kerby Moors M.D.   On: 11/13/2017 08:50   US Biopsy (liver)  Result Date: 11/12/2017 CLINICAL DATA:  Pancreatic mass and multiple liver lesions. The patient presents for liver biopsy. EXAM: ULTRASOUND GUIDED CORE BIOPSY OF LIVER MEDICATIONS: 2.0 mg IV Versed; 50 mcg IV Fentanyl Total Moderate Sedation Time: 20 minutes. The patient's level of consciousness and physiologic status were continuously monitored during the procedure by Radiology nursing. PROCEDURE: The procedure, risks, benefits, and alternatives were explained to the patient. Questions regarding the procedure were encouraged and answered. The patient understands and consents to the procedure. A time out was performed prior to initiating the procedure. The abdominal wall was prepped with chlorhexidine in a sterile fashion, and a sterile drape was applied covering  the operative field. A sterile gown and sterile gloves were used for the procedure. Local anesthesia was provided with 1% Lidocaine. Ultrasound was used to localize lesions throughout the liver. A lesion in the left lobe of the liver was targeted. A 17 gauge needle was advanced under ultrasound guidance into the left lobe parenchyma. Three separate 18 gauge core biopsy samples were obtained through the outer needle. Core biopsy samples were submitted in formalin. Additional ultrasound was performed after outer needle removal. COMPLICATIONS: None. FINDINGS: Multiple ill-defined hypoechoic lesions are seen throughout the liver parenchyma. A lesion measuring 1.5 cm in estimated diameter was targeted due to clear border delineation. Solid tissue was obtained. IMPRESSION: Ultrasound-guided core  biopsy performed of a lesion within the left lobe of the liver. Electronically Signed   By: Aletta Edouard M.D.   On: 11/12/2017 15:27   Ir US Guide Vasc Access Right  Result Date: 11/13/2017 CLINICAL DATA:  Pancreatic cancer EXAM: TUNNEL POWER PORT PLACEMENT WITH SUBCUTANEOUS POCKET UTILIZING ULTRASOUND & FLOUROSCOPY FLUOROSCOPY TIME:  6 seconds.  One mGy. MEDICATIONS AND MEDICAL HISTORY: Versed 2 mg, Fentanyl 75 mcg. Additional Medications: Ancef 2 g. Antibiotics were given within 2 hours of the procedure. ANESTHESIA/SEDATION: Moderate sedation time: 27 minutes. Nursing monitored the the patient during the procedure. PROCEDURE: After written informed consent was obtained, patient was placed in the supine position on angiographic table. The right neck and chest was prepped and draped in a sterile fashion. Lidocaine was utilized for local anesthesia. The right jugular vein was noted to be patent initially with ultrasound. Under sonographic guidance, a micropuncture needle was inserted into the right IJ vein (Ultrasound and fluoroscopic image documentation was performed). The needle was removed over an 018 wire which was  exchanged for a Amplatz. This was advanced into the IVC. An 8-French dilator was advanced over the Amplatz. A small incision was made in the right upper chest over the anterior right second rib. Utilizing blunt dissection, a subcutaneous pocket was created in the caudal direction. The pocket was irrigated with a copious amount of sterile normal saline. The port catheter was tunneled from the chest incision, and out the neck incision. The reservoir was inserted into the subcutaneous pocket and secured with two 3-0 Ethilon stitches. A peel-away sheath was advanced over the Amplatz wire. The port catheter was cut to measure length and inserted through the peel-away sheath. The peel-away sheath was removed. The chest incision was closed with 3-0 Vicryl interrupted stitches for the subcutaneous tissue and a running of 4-0 Vicryl subcuticular stitch for the skin. The neck incision was closed with a 4-0 Vicryl subcuticular stitch. Derma-bond was applied to both surgical incisions. The port reservoir was flushed and instilled with heparinized saline. No complications. FINDINGS: A right IJ vein Port-A-Cath is in place with its tip at the cavoatrial junction. COMPLICATIONS: None IMPRESSION: Successful 8 French right internal jugular vein power port placement with its tip at the SVC/RA junction. Electronically Signed   By: Marybelle Killings M.D.   On: 11/13/2017 10:31   Ir Fluoro Guide Port Insertion Right  Result Date: 11/13/2017 CLINICAL DATA:  Pancreatic cancer EXAM: TUNNEL POWER PORT PLACEMENT WITH SUBCUTANEOUS POCKET UTILIZING ULTRASOUND & FLOUROSCOPY FLUOROSCOPY TIME:  6 seconds.  One mGy. MEDICATIONS AND MEDICAL HISTORY: Versed 2 mg, Fentanyl 75 mcg. Additional Medications: Ancef 2 g. Antibiotics were given within 2 hours of the procedure. ANESTHESIA/SEDATION: Moderate sedation time: 27 minutes. Nursing monitored the the patient during the procedure. PROCEDURE: After written informed consent was obtained, patient was  placed in the supine position on angiographic table. The right neck and chest was prepped and draped in a sterile fashion. Lidocaine was utilized for local anesthesia. The right jugular vein was noted to be patent initially with ultrasound. Under sonographic guidance, a micropuncture needle was inserted into the right IJ vein (Ultrasound and fluoroscopic image documentation was performed). The needle was removed over an 018 wire which was exchanged for a Amplatz. This was advanced into the IVC. An 8-French dilator was advanced over the Amplatz. A small incision was made in the right upper chest over the anterior right second rib. Utilizing blunt dissection, a subcutaneous pocket was created in the caudal direction.  The pocket was irrigated with a copious amount of sterile normal saline. The port catheter was tunneled from the chest incision, and out the neck incision. The reservoir was inserted into the subcutaneous pocket and secured with two 3-0 Ethilon stitches. A peel-away sheath was advanced over the Amplatz wire. The port catheter was cut to measure length and inserted through the peel-away sheath. The peel-away sheath was removed. The chest incision was closed with 3-0 Vicryl interrupted stitches for the subcutaneous tissue and a running of 4-0 Vicryl subcuticular stitch for the skin. The neck incision was closed with a 4-0 Vicryl subcuticular stitch. Derma-bond was applied to both surgical incisions. The port reservoir was flushed and instilled with heparinized saline. No complications. FINDINGS: A right IJ vein Port-A-Cath is in place with its tip at the cavoatrial junction. COMPLICATIONS: None IMPRESSION: Successful 8 French right internal jugular vein power port placement with its tip at the SVC/RA junction. Electronically Signed   By: Marybelle Killings M.D.   On: 11/13/2017 10:31     Scheduled Meds: . amLODipine  5 mg Oral Q supper  . feeding supplement (ENSURE ENLIVE)  237 mL Oral BID BM  . fentaNYL       . heparin lock flush      . hydrocortisone  25 mg Rectal BID  . lidocaine      . lipase/protease/amylase  36,000 Units Oral TID AC  . lisinopril  20 mg Oral Q supper  . midazolam      . pantoprazole  40 mg Oral BID  . polyethylene glycol  17 g Oral q morning - 10a  . pravastatin  20 mg Oral Q supper   Continuous Infusions: . heparin 1,300 Units/hr (11/13/17 1059)    Niel Hummer, MD.  [facility] Pager [phone]  If 7PM-7AM, please contact night-coverage [email or web address] Password [email or web address] 11/13/2017, 2:15 PM

## 2017-11-13 NOTE — Progress Notes (Addendum)
ANTICOAGULATION CONSULT NOTE - Follow-Up  Pharmacy Consult for heparin Indication: mechanical valve, hx DVT, acute bilateral PEs  No Known Allergies  Patient Measurements: Height: 6' (182.9 cm) Weight: 160 lb 0.9 oz (72.6 kg) IBW/kg (Calculated) : 77.6 Heparin Dosing Weight: 72.6 kg  Vital Signs: Temp: 98.7 F (37.1 C) (04/02 0448) Temp Source: Oral (04/02 0448) BP: 124/85 (04/02 0448) Pulse Rate: 98 (04/02 0448)  Labs: Recent Labs    11/11/17 0336  11/11/17 0733 11/12/17 0516 11/12/17 2125 11/13/17 0557  HGB  --    < > 9.2* 9.4* 9.0* 8.7*  HCT  --    < > 28.5* 28.7* 29.0* 28.0*  PLT  --   --  304 385  --  411*  LABPROT 15.7*  --   --  14.5  --  14.0  INR 1.27  --   --  1.14  --  1.09  HEPARINUNFRC 0.40  --   --  0.68  --  0.25*  CREATININE 0.92  --   --   --   --  0.95   < > = values in this interval not displayed.    Estimated Creatinine Clearance: 81.7 mL/min (by C-G formula based on SCr of 0.95 mg/dL).  Assessment: 62 y.o. male with h/o DVT and mechanical AVR/MVR, on Coumadin PTA. 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  Coumadin placed on hold for procedures.   Pt was bridged with heparin previously therapeutic at 1150 units/hr.  Today subtherapeutic on this rate.  Noted CT scan overnight with new bilateral PEs.  No bleeding noted.  Heparin is currently off (held at [phone]) for IR placement of port-a-cath.  Level was drawn prior to holding heparin, therefore will assume accurate.  Goal of Therapy:  Heparin level 0.3-0.7 units/ml Monitor platelets by anticoagulation protocol: Yes   Plan:  When ok to resume, restart heparin at 1300 units/hr. Heparin level in 8 hours after restart. Heparin level and CBC daily while on heparin.  Anticipate discharge on Lovenox 1.5mg /kg daily per Heme/One recs.  Will place CM consult for insurance  coverage.  [employer], Pharm.D., BCPS Clinical Pharmacist Pager: [phone] Clinical phone for 11/13/2017 from 8:30-4:00 is [phone]. After 4pm, please call Main [facility] [phone]) for assistance. 11/13/2017 9:19 AM

## 2017-11-13 NOTE — Progress Notes (Signed)
Patient in IR, unable to restart Heparin IV. Will continue to monitor.

## 2017-11-13 NOTE — Progress Notes (Signed)
Jeff [name] had his CAT scan earlier this morning.  I looked at the scan.  I do not see anything that was obvious with urinary metastasis.  Hopefully, he will go for the Port-A-Cath today.  He still not yet had the Doppler of his legs.  I would think he will go for that today.  His hemoglobin is 9.0 today.  We may want to consider transfusing him at some point.  We will check his iron studies.  I will also send off an erythropoietin level.  I am not sure how physical therapy might help him.  This may not be a bad idea.  We will await the biopsy of the liver metastases.  I would have to believe that this is going to be adenocarcinoma given the markedly elevated CA 19-9.  Once we get the liver biopsy, we will send this off for genetic studies.  May be, we will get like he had can use immunotherapy.  However, I think the chance of doing this is less than 5%.  He still has a decent performance status so that we can consider chemotherapy.  Lattie Haw, MD  Psalm 112:7

## 2017-11-13 NOTE — Consult Note (Signed)
Chief Complaint: Patient was seen in consultation today for [facility] A Cath placement Chief Complaint  Patient presents with  . Abdominal Pain   at the request of Dr Marin Olp  Supervising Physician: Marybelle Killings  Patient Status: [facility] - Out-pt  History of Present Illness: Jeff [name] is a 62 y.o. male   Admitted with N/V and abd pain and wt loss  CT 11/07/17: IMPRESSION: 1. Lobulated mass in the distal body and tail of the pancreas consistent with malignancy. There is extension of the mass to the greater curvature of the stomach with loss of fat plane. 2. Multiple hepatic metastatic disease. 3. Top-normal lymph nodes in the porta pedis. 4. 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  Tumor markers: CEA 219; CA 19-9: 322,500 Liver biopsy yesterday-- pending  Dr Marin Olp requesting PAC today to start chemo therapy soon  Hep off 610 am  Past Medical History:  Diagnosis Date  . Acute ischemic stroke ([place])   . H/O mitral valve replacement with mechanical valve   . Heart disease   . High cholesterol   . History of CVA (cerebrovascular accident)    02/2011  . Hx of aortic valve replacement, mechanical   . HX: long term anticoagulant use   . Stroke [facility])     Past Surgical History:  Procedure Laterality Date  . CARDIAC VALVE REPLACEMENT      Allergies: Patient has no known allergies.  Medications: Prior to Admission medications   Medication Sig Start Date End Date Taking? Authorizing Provider  amLODipine (NORVASC) 5 MG tablet Take 1 tablet (5 mg total) by mouth daily with supper. 08/02/17  Yes Cherene Altes, MD  aspirin EC 81 MG EC tablet Take 1 tablet (81 mg total) by mouth daily. 07/31/17  Yes Cherene Altes, MD  lisinopril (PRINIVIL,ZESTRIL) 20 MG tablet Take 1 tablet (20 mg total) by mouth daily with supper. 08/02/17  Yes Cherene Altes, MD  pravastatin (PRAVACHOL) 20 MG tablet Take 20 mg by mouth daily with supper.  05/03/15  Yes [provider]  tadalafil (CIALIS) 5 MG tablet Take 5 mg by mouth daily as needed for erectile dysfunction.  07/27/17  Yes [provider]  warfarin (COUMADIN) 10 MG tablet Take 10 mg by mouth daily.   Yes [provider]  warfarin (COUMADIN) 7.5 MG tablet Take 7.5 mg by mouth daily.   Yes [provider]     Family History  Problem Relation Age of Onset  . Diabetes Mother        Died before her 90 birthday  . Heart attack Brother     Social History   Socioeconomic History  . Marital status: Single    Spouse name: Not on file  . Number of children: Not on file  . Years of education: Not on file  . Highest education level: Not on file  Occupational History  . Not on file  Social Needs  . Financial resource strain: Not on file  . Food insecurity:    Worry: Not on file    Inability: Not on file  . Transportation needs:    Medical: Not on file    Non-medical: Not on file  Tobacco Use  . Smoking status: Current Every Day Smoker    Packs/day: 0.05    Types: Cigarettes  . Smokeless tobacco: Never Used  . Tobacco comment: smoke two per day  Substance and Sexual Activity  . Alcohol use: Yes  Comment: wine coolers prn  . Drug use: Yes    Frequency: 7.0 times per week    Types: Marijuana    Comment: smoke when he can get it  . Sexual activity: Yes  Lifestyle  . Physical activity:    Days per week: Not on file    Minutes per session: Not on file  . Stress: Not on file  Relationships  . Social connections:    Talks on phone: Not on file    Gets together: Not on file    Attends religious service: Not on file    Active member of club or organization: Not on file    Attends meetings of clubs or organizations: Not on file    Relationship status: Not on file  Other Topics Concern  . Not on file  Social History Narrative  . Not on file    Review of Systems: A 12 point ROS discussed and pertinent positives are indicated in the HPI above.  All  other systems are negative.  Review of Systems  Constitutional: Positive for activity change, appetite change and unexpected weight change.  Respiratory: Negative for cough and shortness of breath.   Cardiovascular: Negative for chest pain.  Gastrointestinal: Positive for abdominal pain and nausea.  Neurological: Positive for weakness.  Psychiatric/Behavioral: Negative for behavioral problems and confusion.    Vital Signs: BP 124/85 (BP Location: Right Arm)   Pulse 98   Temp 98.7 F (37.1 C) (Oral)   Resp 18   Ht 6' (1.829 m)   Wt 160 lb 0.9 oz (72.6 kg)   SpO2 99%   BMI 21.71 kg/m   Physical Exam  Constitutional: He is oriented to person, place, and time.  Cardiovascular: Normal rate and regular rhythm.  Pulmonary/Chest: Effort normal and breath sounds normal.  Abdominal: Normal appearance.  Neurological: He is alert and oriented to person, place, and time.  Skin: Skin is warm and dry.  Psychiatric: He has a normal mood and affect. His behavior is normal.  Nursing note and vitals reviewed.   Imaging: Dg Chest 2 View  Result Date: 11/07/2017 CLINICAL DATA:  Abdominal pain with nausea vomiting EXAM: CHEST - 2 VIEW COMPARISON:  03/21/2015 FINDINGS: Post sternotomy changes. Scarring at the lingula and left base. Cardiomediastinal silhouette within normal limits. Aortic atherosclerosis. No pneumothorax. IMPRESSION: No active cardiopulmonary disease. Scarring at the lingula and left base. Electronically Signed   By: Donavan Foil M.D.   On: 11/07/2017 21:42   Ct Abdomen Pelvis W Contrast  Result Date: 11/08/2017 CLINICAL DATA:  62 year old male with acute abdominal pain. Nausea vomiting. EXAM: CT ABDOMEN AND PELVIS WITH CONTRAST TECHNIQUE: Multidetector CT imaging of the abdomen and pelvis was performed using the standard protocol following bolus administration of intravenous contrast. CONTRAST:  122mL ISOVUE-300 IOPAMIDOL (ISOVUE-300) INJECTION 61% COMPARISON:  Abdominal CT dated  11/30/2003 FINDINGS: Lower chest: The visualized lung bases are clear. No intra-abdominal free air or free fluid. Hepatobiliary: Multiple (greater than 20) hepatic hypodense masses measure up to 4 x 5 cm in segment VI consistent with metastatic disease. No intrahepatic biliary ductal dilatation. The gallbladder is unremarkable. Pancreas: There is a 4.7 x 6.3 cm lobulated hypoenhancing mass in the distal body and tail of the pancreas consistent with malignancy. There is extension of the mass to the greater curvature of the stomach with loss of fat plane. Spleen: Normal in size without focal abnormality. Adrenals/Urinary Tract: The adrenal glands are unremarkable. A 1 cm exophytic left renal hypodense lesion is  not well characterized but likely represents a cyst. Bilateral renal cortical irregularity and scarring. There is no hydronephrosis on either side. The visualized ureters and urinary bladder appear unremarkable. Stomach/Bowel: Liquid content noted in the distal esophagus may represent gastroesophageal reflux or esophageal dysmotility. There is no bowel obstruction or active inflammation. Normal appendix. Vascular/Lymphatic: Mild aortoiliac atherosclerotic disease. The origins of the celiac axis, SMA, IMA appear patent. The SMV, and main portal vein are patent. The splenic vein is not visualized, likely occluded by the pancreatic mass. Top-normal lymph nodes in the region of the porta hepatis. Reproductive: The prostate and seminal vesicles are grossly unremarkable. Other: None Musculoskeletal: No acute osseous pathology. IMPRESSION: 1. Lobulated mass in the distal body and tail of the pancreas consistent with malignancy. There is extension of the mass to the greater curvature of the stomach with loss of fat plane. 2. Multiple hepatic metastatic disease. 3. Top-normal lymph nodes in the porta pedis. 4. 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 Electronically Signed   By: Anner Crete M.D.   On:  11/08/2017 00:34   US Biopsy (liver)  Result Date: 11/12/2017 CLINICAL DATA:  Pancreatic mass and multiple liver lesions. The patient presents for liver biopsy. EXAM: ULTRASOUND GUIDED CORE BIOPSY OF LIVER MEDICATIONS: 2.0 mg IV Versed; 50 mcg IV Fentanyl Total Moderate Sedation Time: 20 minutes. The patient's level of consciousness and physiologic status were continuously monitored during the procedure by Radiology nursing. PROCEDURE: The procedure, risks, benefits, and alternatives were explained to the patient. Questions regarding the procedure were encouraged and answered. The patient understands and consents to the procedure. A time out was performed prior to initiating the procedure. The abdominal wall was prepped with chlorhexidine in a sterile fashion, and a sterile drape was applied covering the operative field. A sterile gown and sterile gloves were used for the procedure. Local anesthesia was provided with 1% Lidocaine. Ultrasound was used to localize lesions throughout the liver. A lesion in the left lobe of the liver was targeted. A 17 gauge needle was advanced under ultrasound guidance into the left lobe parenchyma. Three separate 18 gauge core biopsy samples were obtained through the outer needle. Core biopsy samples were submitted in formalin. Additional ultrasound was performed after outer needle removal. COMPLICATIONS: None. FINDINGS: Multiple ill-defined hypoechoic lesions are seen throughout the liver parenchyma. A lesion measuring 1.5 cm in estimated diameter was targeted due to clear border delineation. Solid tissue was obtained. IMPRESSION: Ultrasound-guided core biopsy performed of a lesion within the left lobe of the liver. Electronically Signed   By: Aletta Edouard M.D.   On: 11/12/2017 15:27    Labs:  CBC: Recent Labs    11/10/17 0517 11/11/17 0733 11/12/17 0516 11/12/17 2125 11/13/17 0557  WBC 10.5 9.9 8.4  --  9.9  HGB 9.5* 9.2* 9.4* 9.0* 8.7*  HCT 30.0* 28.5* 28.7*  29.0* 28.0*  PLT 306 304 385  --  411*    COAGS: Recent Labs    07/29/17 2305  11/10/17 0517 11/11/17 0336 11/12/17 0516 11/13/17 0557  INR 1.48   < > 1.74 1.27 1.14 1.09  APTT 37*  --   --   --   --   --    < > = values in this interval not displayed.    BMP: Recent Labs    11/08/17 0459 11/09/17 1202 11/11/17 0336 11/13/17 0557  NA 137 136 136 136  K 3.9 4.1 3.9 4.1  CL 102 102 101 100*  CO2 24 22 26 24   GLUCOSE 116* 109* 116* 147*  BUN 16 11 12 16   CALCIUM 9.0 8.9 8.6* 8.7*  CREATININE 1.16 1.01 0.92 0.95  GFRNONAA >60 >60 >60 >60  GFRAA >60 >60 >60 >60    LIVER FUNCTION TESTS: Recent Labs    11/07/17 2126 11/09/17 1202 11/11/17 0336 11/13/17 0557  BILITOT 1.0 1.3* 1.5* 0.9  AST 54* 60* 41 51*  ALT 56 50 42 52  ALKPHOS 403* 393* 369* 412*  PROT 6.7 6.4* 6.0* 6.4*  ALBUMIN 3.6 3.4* 2.9* 2.9*    TUMOR MARKERS: No results for input(s): AFPTM, CEA, CA199, CHROMGRNA in the last 8760 hours.  Assessment and Plan:  Metastatic pancreatic cancer- hepatic mets Liver lesion biopsy yesterday- pending Scheduled now for Port a cath placement per Dr Marin Olp Risks and benefits of image guided port-a-catheter placement was discussed with the patient including, but not limited to bleeding, infection, pneumothorax, or fibrin sheath development and need for additional procedures.  All of the patient's questions were answered, patient is agreeable to proceed. Consent signed and in chart.  Thank you for this interesting consult.  I greatly enjoyed meeting Cosby Proby and look forward to participating in their care.  A copy of this report was sent to the requesting provider on this date.  Electronically Signed: Lavonia Drafts, PA-C 11/13/2017, 8:26 AM   I spent a total of 20 Minutes    in face to face in clinical consultation, greater than 50% of which was counseling/coordinating care for [facility] placement

## 2017-11-13 NOTE — Progress Notes (Signed)
Nutrition Follow-up  DOCUMENTATION CODES:   Non-severe (moderate) malnutrition in context of chronic illness  INTERVENTION:  Ensure Enlive po BID, each supplement provides 350 kcal and 20 grams of protein  NUTRITION DIAGNOSIS:   Moderate Malnutrition related to chronic illness(pancreatic mass) as evidenced by energy intake < 75% for > or equal to 1 month, moderate fat depletion, moderate muscle depletion, severe muscle depletion, severe fat depletion. -ongoing  GOAL:   Patient will meet greater than or equal to 90% of their needs -met prior to NPO status  MONITOR:   PO intake, Supplement acceptance, Weight trends, Labs  ASSESSMENT:   Patient with PMH significant for HTN, tobacco abuse, mechanical aortic and mitral valve replacement, history of stroke in December 2018 4 months ago and recently diagnosed left lower extremity DVT. Presents this admission with abdominal pain with pancreatic mass concerning for malignancy. Plan for GI consult and biopsy.   Patient was NPO this morning, diet was advanced shortly before RD visited. He states he feels great and is hungry, had ensure in a styrofoam cup. PO intake prior to being made NPO 100%. Underwent PAC placement toady to begin chemo. Ordered chicken and dumplings. RD will continue to monitor.  Labs reviewed  Medications reviewed and include:  Miralax, Protonix, Creon Heparin gtt  Diet Order:  Diet regular Room service appropriate? Yes; Fluid consistency: Thin  EDUCATION NEEDS:   Education needs have been addressed  Skin:  Skin Assessment: Reviewed RN Assessment  Last BM:  11/12/2017  Height:   Ht Readings from Last 1 Encounters:  11/08/17 6' (1.829 m)    Weight:   Wt Readings from Last 1 Encounters:  11/08/17 160 lb 0.9 oz (72.6 kg)    Ideal Body Weight:  80.9 kg  BMI:  Body mass index is 21.71 kg/m.  Estimated Nutritional Needs:   Kcal:  2250-2450 kcal/day  Protein:  125-135 g/day  Fluid:  >2.2  L/day  Jeff [name]. Jeff Ousley, MS, RD LDN Inpatient Clinical Dietitian Pager [phone]

## 2017-11-13 NOTE — Progress Notes (Signed)
  Echocardiogram 2D Echocardiogram has been performed.  Jeff [name] 11/13/2017, 12:01 PM

## 2017-11-13 NOTE — Progress Notes (Signed)
ANTICOAGULATION CONSULT NOTE - Follow-Up  Pharmacy Consult for heparin Indication: mechanical valve, hx DVT, acute bilateral PEs  No Known Allergies  Patient Measurements: Height: 6' (182.9 cm) Weight: 160 lb 0.9 oz (72.6 kg) IBW/kg (Calculated) : 77.6 Heparin Dosing Weight: 72.6 kg  Vital Signs: Temp: 99 F (37.2 C) (04/02 1441) Temp Source: Oral (04/02 1441) BP: 121/89 (04/02 1441) Pulse Rate: 112 (04/02 1441)  Labs: Recent Labs    11/11/17 0336  11/11/17 0733 11/12/17 0516 11/12/17 2125 11/13/17 0557 11/13/17 1857  HGB  --    < > 9.2* 9.4* 9.0* 8.7*  --   HCT  --    < > 28.5* 28.7* 29.0* 28.0*  --   PLT  --   --  304 385  --  411*  --   LABPROT 15.7*  --   --  14.5  --  14.0  --   INR 1.27  --   --  1.14  --  1.09  --   HEPARINUNFRC 0.40  --   --  0.68  --  0.25* 0.26*  CREATININE 0.92  --   --   --   --  0.95  --    < > = values in this interval not displayed.    Estimated Creatinine Clearance: 81.7 mL/min (by C-G formula based on SCr of 0.95 mg/dL).  Assessment: 62 y.o. male with h/o DVT and mechanical AVR/MVR, on Coumadin PTA. 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  Coumadin held for procedures. Heparin was restarted at 11AM post-IR.   Heparin level is sub-therapeutic at 0.26 on 1300 units/hr.  Hgb 8.7 - anemia, no bleeding reported since admission.  Platelets elevated at 411.   Goal of Therapy:  Heparin level 0.3-0.7 units/ml Monitor platelets by anticoagulation protocol: Yes   Plan:  Increase Heparin to 1450 units/hr.  Follow daily heparin level.   Sloan Leiter, PharmD, BCPS, BCCCP Clinical Pharmacist Clinical phone 11/13/2017 until [phone] After hours, please call #[phone] 11/13/2017 7:53 PM

## 2017-11-13 NOTE — Progress Notes (Signed)
Patient back from IR. Alert and oriented, no complaints at this time. Per MD order, Heparin drip was restarted at 11 AM. Will continue to monitor.

## 2017-11-14 LAB — BASIC METABOLIC PANEL
ANION GAP: 12 (ref 5–15)
BUN: 10 mg/dL (ref 6–20)
CALCIUM: 8.9 mg/dL (ref 8.9–10.3)
CO2: 25 mmol/L (ref 22–32)
CREATININE: 0.93 mg/dL (ref 0.61–1.24)
Chloride: 98 mmol/L — ABNORMAL LOW (ref 101–111)
GFR calc Af Amer: >60 mL/min (ref >60)
GFR calc non Af Amer: >60 mL/min (ref >60)
Glucose, Bld: 129 mg/dL — ABNORMAL HIGH (ref 65–99)
Potassium: 4.6 mmol/L (ref 3.5–5.1)
Sodium: 135 mmol/L (ref 135–145)

## 2017-11-14 LAB — CBC
HCT: 28.7 % — ABNORMAL LOW (ref 39.0–52.0)
Hemoglobin: 8.9 g/dL — ABNORMAL LOW (ref 13.0–17.0)
MCH: 28.9 pg (ref 26.0–34.0)
MCHC: 31 g/dL (ref 30.0–36.0)
MCV: 93.2 fL (ref 78.0–100.0)
PLATELETS: 445 10*3/uL — AB (ref 150–400)
RBC: 3.08 MIL/uL — ABNORMAL LOW (ref 4.22–5.81)
RDW: 13.8 % (ref 11.5–15.5)
WBC: 11.2 10*3/uL — AB (ref 4.0–10.5)

## 2017-11-14 LAB — PROTIME-INR
INR: 1.06
PROTHROMBIN TIME: 13.7 s (ref 11.4–15.2)

## 2017-11-14 LAB — HEPARIN LEVEL (UNFRACTIONATED): Heparin Unfractionated: 0.48 IU/mL (ref 0.30–0.70)

## 2017-11-14 NOTE — Progress Notes (Signed)
PROGRESS NOTE  Jeff [name] OXB:[id number] DOB: 10/03/1955 DOA: 11/07/2017 PCP: Kerin Perna, NP   LOS: 5 days   Brief Narrative / Interim history: 62 year old with history of HTN, tobacco use, mechanical aortic valve replacement in the mis 1990s, prior CVA and LLE DVT in the setting of subtherapeutic INR who presents with abdominal pain, nausea and vomiting. He was found to have a pancreatic mass concerning for malignancy with liver mets. Oncology following, underwent liver biopsy on 4/1 showing adenocarcinoma likely pancreas primary. Had port placed 4/2. He also had a CT chest which showed bilateral PEs and is on IV heparin. Echo showed mitral valve vegetation.   Assessment & Plan: Principal Problem:   Abdominal pain Active Problems:   History of stroke   Hx of aortic valve replacement, mechanical   Hypertension   Smoker   Pancreatic mass   Rectal bleeding   Acute deep vein thrombosis (DVT) of left lower extremity (HCC)   Acute blood loss anemia   Malnutrition of moderate degree   Blood in stool   Pancreas cancer (HCC)   Abdominal pain in the setting of underlying pancreatic adenocarcinoma -oncology consulted, appreciate input, underwent liver biopsy and it showed adenocarcinoma -CEA elevated to 19, CA 19-9 322,500 -already underwent Port cath placement  Acute Bilateral  lobar and subsegmental PE -in the setting of subtherapeutic INR, patient already on IV heparin.  -Patient will need Lovenox at discharge. 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   History of mechanical Aortic and mitral valve replacement;  -on heparin gtt, discussed with Dr Marin Olp, ok to use lovenox in mechanical valve.   Bright blood blood per rectum -Patient reports bright red blood with bowel movements for the last couple  weeks -Hemoglobin overall is stable -GI consulted, no need for colonoscopy or any other procedures at this pointmonitor hb.   Acute blood loss anemia -hemoglobin stable, 8.9 this morning  -received IV iron   Hypertension -hold lisinopril and norvasc, now with diagnosis of PE. Monitor BP.   Left lower extremity DVT -While on Coumadin, he has a history of being subtherapeutic which is probably why he had a DVT. -For now continue heparin, will be on Lovenox   Acute kidney injury -Resolved  Elevated LFTs -Likely due to liver metastases    DVT prophylaxis: heparin infusion Code Status: Full code Family Communication: no family at bedside Disposition Plan: home when ready   Consultants:   Oncology   IR  Cardiology - TEE  Procedures:   2D echo:  Study Conclusions - Left ventricle: The cavity size was normal. There was mild concentric hypertrophy. Systolic function was normal. The estimated ejection fraction was in the range of 50% to 55%. - Ventricular septum: These changes are consistent with a post-thoracotomy state. - Aortic valve: Mildly dilated annulus. A mechanical prosthesis was present. Cannot exclude vegetation. See MV description. There was very mild stenosis. - Aorta: The aorta was mildly dilated. Aortic root dimension: 39 mm (ED). - Mitral valve: Moderately to severely calcified annulus. A mechanical prosthesis was present. Mobility was mildly restricted. There was a vegetation on the left ventricular aspect; based on its appearance, thrombus or fibroelastoma cannot be excluded. There is fimbriated mass, multiple noted in the subvalvular apparatus involving the AV and a single mobile mass with appearance of calcified vegetation on the posterior papillary muscle, but  could also represent mass. Recomment TEE for further evaluation. The findings are consistent with mild stenosis. Valve area by pressure half-time: 2.39 cm^2. - Left atrium: The atrium was mildly  dilated.  Antimicrobials:  None    Subjective: - no chest pain, shortness of breath, no abdominal pain, nausea or vomiting.   Objective: Vitals:   11/13/17 1155 11/13/17 1441 11/13/17 2131 11/14/17 0551  BP: 117/82 121/89 106/76 112/79  Pulse: 91 (!) 112  100  Resp: 18 18 18 18   Temp:  99 F (37.2 C) 99.9 F (37.7 C) 99.1 F (37.3 C)  TempSrc:  Oral Oral Oral  SpO2: 100% 100% 100% 95%  Weight:      Height:        Intake/Output Summary (Last 24 hours) at 11/14/2017 1115 Last data filed at 11/14/2017 0950 Gross per 24 hour  Intake 398.8 ml  Output -  Net 398.8 ml   Filed Weights   11/07/17 2105 11/08/17 1623  Weight: 72.6 kg (160 lb) 72.6 kg (160 lb 0.9 oz)    Examination:  Constitutional: NAD Eyes: lids and conjunctivae normal ENMT: Mucous membranes are moist.  Neck: normal, supple, no masses Respiratory: clear to auscultation bilaterally, no wheezing, no crackles. Normal respiratory effort.  Cardiovascular: Regular rate and rhythm,mechanical click. No LE edema. 2+ pedal pulses.  Abdomen: no tenderness. Bowel sounds positive.  Skin: no rashes, lesions, ulcers. No induration Neurologic: non focal, ambulatory    Data Reviewed: I have independently reviewed following labs and imaging studies   CBC: Recent Labs  Lab 11/07/17 2126  11/10/17 0517 11/11/17 0733 11/12/17 0516 11/12/17 2125 11/13/17 0557 11/14/17 0533  WBC 9.2   < > 10.5 9.9 8.4  --  9.9 11.2*  NEUTROABS 7.9*  --   --   --   --   --   --   --   HGB 10.1*   < > 9.5* 9.2* 9.4* 9.0* 8.7* 8.9*  HCT 30.8*   < > 30.0* 28.5* 28.7* 29.0* 28.0* 28.7*  MCV 93.1   < > 93.2 92.5 93.2  --  94.0 93.2  PLT 328   < > 306 304 385  --  411* 445*   < > = values in this interval not displayed.   Basic Metabolic Panel: Recent Labs  Lab 11/08/17 0459 11/09/17 1202 11/11/17 0336 11/13/17 0557 11/14/17 0533  NA 137 136 136 136 135  K 3.9 4.1 3.9 4.1 4.6  CL 102 102 101 100* 98*  CO2 24 22 26 24 25   GLUCOSE  116* 109* 116* 147* 129*  BUN 16 11 12 16 10   CREATININE 1.16 1.01 0.92 0.95 0.93  CALCIUM 9.0 8.9 8.6* 8.7* 8.9   GFR: Estimated Creatinine Clearance: 83.5 mL/min (by C-G formula based on SCr of 0.93 mg/dL). Liver Function Tests: Recent Labs  Lab 11/07/17 2126 11/09/17 1202 11/11/17 0336 11/13/17 0557  AST 54* 60* 41 51*  ALT 56 50 42 52  ALKPHOS 403* 393* 369* 412*  BILITOT 1.0 1.3* 1.5* 0.9  PROT 6.7 6.4* 6.0* 6.4*  ALBUMIN 3.6 3.4* 2.9* 2.9*   Recent Labs  Lab 11/07/17 2126  LIPASE 33   No results for input(s): AMMONIA in the last 168 hours. Coagulation Profile: Recent Labs  Lab 11/10/17 0517 11/11/17 0336 11/12/17 0516 11/13/17 0557 11/14/17 0533  INR 1.74 1.27 1.14 1.09 1.06   Cardiac Enzymes: No results for input(s): CKTOTAL, CKMB, CKMBINDEX, TROPONINI in the last 168 hours. BNP (last 3 results) No  results for input(s): PROBNP in the last 8760 hours. HbA1C: No results for input(s): HGBA1C in the last 72 hours. CBG: No results for input(s): GLUCAP in the last 168 hours. Lipid Profile: No results for input(s): CHOL, HDL, LDLCALC, TRIG, CHOLHDL, LDLDIRECT in the last 72 hours. Thyroid Function Tests: No results for input(s): TSH, T4TOTAL, FREET4, T3FREE, THYROIDAB in the last 72 hours. Anemia Panel: Recent Labs    11/13/17 0557  FERRITIN 976*  TIBC 204*  IRON 25*   Urine analysis:    Component Value Date/Time   COLORURINE AMBER (A) 11/08/2017 0338   APPEARANCEUR HAZY (A) 11/08/2017 0338   LABSPEC >1.046 (H) 11/08/2017 0338   PHURINE 5.0 11/08/2017 0338   GLUCOSEU NEGATIVE 11/08/2017 0338   HGBUR SMALL (A) 11/08/2017 0338   BILIRUBINUR NEGATIVE 11/08/2017 0338   KETONESUR NEGATIVE 11/08/2017 0338   PROTEINUR 100 (A) 11/08/2017 0338   UROBILINOGEN 1.0 07/10/2013 1401   NITRITE NEGATIVE 11/08/2017 0338   LEUKOCYTESUR NEGATIVE 11/08/2017 0338   Sepsis Labs: Invalid input(s): PROCALCITONIN, LACTICIDVEN  No results found for this or any  previous visit (from the past 240 hour(s)).    Radiology Studies: Ct Chest W Contrast  Result Date: 11/13/2017 CLINICAL DATA:  Evaluate for metastatic disease.  Pancreas neoplasm. EXAM: CT CHEST WITH CONTRAST TECHNIQUE: Multidetector CT imaging of the chest was performed during intravenous contrast administration. CONTRAST:  74mL ISOVUE-300 IOPAMIDOL (ISOVUE-300) INJECTION 61% COMPARISON:  CT chest 4/23/5 FINDINGS: Cardiovascular: Previous median sternotomy and aortic and mitral valve replacement. The ascending thoracic aorta Measures 4.9 cm in diameter, image 76/3. Previously 4.4 cm. Large filling defect within the right lower lobar pulmonary artery and its main branches identified compatible with acute embolus. There are filling defects within the lingular pulmonary artery as well as segmental branches of the left lower lobe pulmonary artery. Mediastinum/Nodes: The trachea appears patent and is midline. Normal appearance of the esophagus. No enlarged mediastinal or hilar lymph nodes. Lungs/Pleura: No pleural effusion. No airspace consolidation atelectasis or pneumothorax. 5 mm perifissural nodule in the right lower lobe is new from previous exam image 75/4. Perifissural nodule within the left lower lobe measures 5 mm, image 74/4. Right upper lobe pulmonary artery measures 5 mm, image 58/4. New from previous exam. Upper Abdomen: Extensive liver metastases are again noted as seen on 11/07/2017. Not significantly changed. No acute abnormality identified. Musculoskeletal: No chest wall abnormality. No acute or significant osseous findings. IMPRESSION: 1. There are few small nonspecific pulmonary nodules. The largest is in the right upper lobe measuring 5 mm. Not identified on previous chest CT from 12/05/03. 2. Exam positive for acute bilateral lobar and segmental pulmonary emboli. Critical Value/emergent results were called by telephone at the time of interpretation on 11/13/2017 at 8:46 am to Dr. Tyrell Antonio, who  verbally acknowledged these results. 3.  Aortic Atherosclerosis (ICD10-I70.0). 4. Liver metastasis as noted on 03/27/noted. 5. Ascending thoracic aortic aneurysm. Recommend semi-annual imaging followup by CTA or MRA and referral to cardiothoracic surgery if not already obtained. This recommendation follows 2010 ACCF/AHA/AATS/ACR/ASA/SCA/SCAI/SIR/STS/SVM Guidelines for the Diagnosis and Management of Patients With Thoracic Aortic Disease. Circulation. 2010; 121: Z610-R604 Electronically Signed   By: Kerby Moors M.D.   On: 11/13/2017 08:50   US Biopsy (liver)  Result Date: 11/12/2017 CLINICAL DATA:  Pancreatic mass and multiple liver lesions. The patient presents for liver biopsy. EXAM: ULTRASOUND GUIDED CORE BIOPSY OF LIVER MEDICATIONS: 2.0 mg IV Versed; 50 mcg IV Fentanyl Total Moderate Sedation Time: 20 minutes. The patient's level of consciousness  and physiologic status were continuously monitored during the procedure by Radiology nursing. PROCEDURE: The procedure, risks, benefits, and alternatives were explained to the patient. Questions regarding the procedure were encouraged and answered. The patient understands and consents to the procedure. A time out was performed prior to initiating the procedure. The abdominal wall was prepped with chlorhexidine in a sterile fashion, and a sterile drape was applied covering the operative field. A sterile gown and sterile gloves were used for the procedure. Local anesthesia was provided with 1% Lidocaine. Ultrasound was used to localize lesions throughout the liver. A lesion in the left lobe of the liver was targeted. A 17 gauge needle was advanced under ultrasound guidance into the left lobe parenchyma. Three separate 18 gauge core biopsy samples were obtained through the outer needle. Core biopsy samples were submitted in formalin. Additional ultrasound was performed after outer needle removal. COMPLICATIONS: None. FINDINGS: Multiple ill-defined hypoechoic lesions  are seen throughout the liver parenchyma. A lesion measuring 1.5 cm in estimated diameter was targeted due to clear border delineation. Solid tissue was obtained. IMPRESSION: Ultrasound-guided core biopsy performed of a lesion within the left lobe of the liver. Electronically Signed   By: Aletta Edouard M.D.   On: 11/12/2017 15:27   Ir US Guide Vasc Access Right  Result Date: 11/13/2017 CLINICAL DATA:  Pancreatic cancer EXAM: TUNNEL POWER PORT PLACEMENT WITH SUBCUTANEOUS POCKET UTILIZING ULTRASOUND & FLOUROSCOPY FLUOROSCOPY TIME:  6 seconds.  One mGy. MEDICATIONS AND MEDICAL HISTORY: Versed 2 mg, Fentanyl 75 mcg. Additional Medications: Ancef 2 g. Antibiotics were given within 2 hours of the procedure. ANESTHESIA/SEDATION: Moderate sedation time: 27 minutes. Nursing monitored the the patient during the procedure. PROCEDURE: After written informed consent was obtained, patient was placed in the supine position on angiographic table. The right neck and chest was prepped and draped in a sterile fashion. Lidocaine was utilized for local anesthesia. The right jugular vein was noted to be patent initially with ultrasound. Under sonographic guidance, a micropuncture needle was inserted into the right IJ vein (Ultrasound and fluoroscopic image documentation was performed). The needle was removed over an 018 wire which was exchanged for a Amplatz. This was advanced into the IVC. An 8-French dilator was advanced over the Amplatz. A small incision was made in the right upper chest over the anterior right second rib. Utilizing blunt dissection, a subcutaneous pocket was created in the caudal direction. The pocket was irrigated with a copious amount of sterile normal saline. The port catheter was tunneled from the chest incision, and out the neck incision. The reservoir was inserted into the subcutaneous pocket and secured with two 3-0 Ethilon stitches. A peel-away sheath was advanced over the Amplatz wire. The port catheter  was cut to measure length and inserted through the peel-away sheath. The peel-away sheath was removed. The chest incision was closed with 3-0 Vicryl interrupted stitches for the subcutaneous tissue and a running of 4-0 Vicryl subcuticular stitch for the skin. The neck incision was closed with a 4-0 Vicryl subcuticular stitch. Derma-bond was applied to both surgical incisions. The port reservoir was flushed and instilled with heparinized saline. No complications. FINDINGS: A right IJ vein Port-A-Cath is in place with its tip at the cavoatrial junction. COMPLICATIONS: None IMPRESSION: Successful 8 French right internal jugular vein power port placement with its tip at the SVC/RA junction. Electronically Signed   By: Marybelle Killings M.D.   On: 11/13/2017 10:31   Ir Fluoro Guide Port Insertion Right  Result Date: 11/13/2017 CLINICAL  DATA:  Pancreatic cancer EXAM: TUNNEL POWER PORT PLACEMENT WITH SUBCUTANEOUS POCKET UTILIZING ULTRASOUND & FLOUROSCOPY FLUOROSCOPY TIME:  6 seconds.  One mGy. MEDICATIONS AND MEDICAL HISTORY: Versed 2 mg, Fentanyl 75 mcg. Additional Medications: Ancef 2 g. Antibiotics were given within 2 hours of the procedure. ANESTHESIA/SEDATION: Moderate sedation time: 27 minutes. Nursing monitored the the patient during the procedure. PROCEDURE: After written informed consent was obtained, patient was placed in the supine position on angiographic table. The right neck and chest was prepped and draped in a sterile fashion. Lidocaine was utilized for local anesthesia. The right jugular vein was noted to be patent initially with ultrasound. Under sonographic guidance, a micropuncture needle was inserted into the right IJ vein (Ultrasound and fluoroscopic image documentation was performed). The needle was removed over an 018 wire which was exchanged for a Amplatz. This was advanced into the IVC. An 8-French dilator was advanced over the Amplatz. A small incision was made in the right upper chest over the  anterior right second rib. Utilizing blunt dissection, a subcutaneous pocket was created in the caudal direction. The pocket was irrigated with a copious amount of sterile normal saline. The port catheter was tunneled from the chest incision, and out the neck incision. The reservoir was inserted into the subcutaneous pocket and secured with two 3-0 Ethilon stitches. A peel-away sheath was advanced over the Amplatz wire. The port catheter was cut to measure length and inserted through the peel-away sheath. The peel-away sheath was removed. The chest incision was closed with 3-0 Vicryl interrupted stitches for the subcutaneous tissue and a running of 4-0 Vicryl subcuticular stitch for the skin. The neck incision was closed with a 4-0 Vicryl subcuticular stitch. Derma-bond was applied to both surgical incisions. The port reservoir was flushed and instilled with heparinized saline. No complications. FINDINGS: A right IJ vein Port-A-Cath is in place with its tip at the cavoatrial junction. COMPLICATIONS: None IMPRESSION: Successful 8 French right internal jugular vein power port placement with its tip at the SVC/RA junction. Electronically Signed   By: Marybelle Killings M.D.   On: 11/13/2017 10:31   Scheduled Meds: . feeding supplement (ENSURE ENLIVE)  237 mL Oral BID BM  . lipase/protease/amylase  36,000 Units Oral TID AC  . pantoprazole  40 mg Oral BID  . polyethylene glycol  17 g Oral q morning - 10a  . pravastatin  20 mg Oral Q supper   Continuous Infusions: . heparin 1,450 Units/hr (11/14/17 0622)    Marzetta Board, MD, PhD [facility] Pager [phone]  If 7PM-7AM, please contact night-coverage [email or web address] Password [email or web address] 11/14/2017, 11:15 AM

## 2017-11-14 NOTE — Care Management Important Message (Signed)
Important Message  Patient Details  Name: Jeff [name] MRN: [id number] Date of Birth: December 08, 1955   Medicare Important Message Given:  Yes    Eulalia Ellerman [address] Circle 11/14/2017, 11:07 AM

## 2017-11-14 NOTE — Progress Notes (Signed)
Jeff [name] had his  Port-A-Cath put in yesterday.  This went well.  He had his echocardiogram done.  Looks like he may have some not infectious vegetations on the mitral valve.  This might be related to his malignancy.  I certainly would recommend a TEE to evaluate this.   he is on heparin infusion.  I would keep him on the heparin until we know about the TEE.  Again, I think this would be reasonable to do to see if he does have vegetations which probably would be consistent with his malignancy (Libmann Sacks endocarditis).  He did get iron yesterday.  His CBC shows a white cell count 11.2.  Hemoglobin 8.9.  Platelet count 445,000.  The Doppler did not show any acute thrombus in the right leg.  He has had no bleeding.  His appetite might be doing a little bit better.  He still needs to be transitioned over to Lovenox.  Again, I would not until he has the TEE done.  Lattie Haw, MD

## 2017-11-14 NOTE — Progress Notes (Signed)
Per Tele Pt had 11 beat run of VTach. MD aware. Will continue to monitor.

## 2017-11-14 NOTE — Progress Notes (Signed)
ANTICOAGULATION CONSULT NOTE - Follow-Up  Pharmacy Consult for heparin Indication: mechanical valve, hx LLE DVT, new RLE DVT, acute bilateral PEs, mMVR vegetation  No Known Allergies  Patient Measurements: Height: 6' (182.9 cm) Weight: 160 lb 0.9 oz (72.6 kg) IBW/kg (Calculated) : 77.6 Heparin Dosing Weight: 72.6 kg  Vital Signs: Temp: 99.1 F (37.3 C) (04/03 0551) Temp Source: Oral (04/03 0551) BP: 112/79 (04/03 0551) Pulse Rate: 100 (04/03 0551)  Labs: Recent Labs    11/12/17 0516 11/12/17 2125 11/13/17 0557 11/13/17 1857 11/14/17 0533  HGB 9.4* 9.0* 8.7*  --  8.9*  HCT 28.7* 29.0* 28.0*  --  28.7*  PLT 385  --  411*  --  445*  LABPROT 14.5  --  14.0  --  13.7  INR 1.14  --  1.09  --  1.06  HEPARINUNFRC 0.68  --  0.25* 0.26* 0.48  CREATININE  --   --  0.95  --  0.93    Estimated Creatinine Clearance: 83.5 mL/min (by C-G formula based on SCr of 0.93 mg/dL).  Assessment: 62 y.o. male with h/o DVT and mechanical AVR/MVR, on Coumadin PTA. 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  Coumadin held for procedures. Patient was placed on heparin while undergoing further tests/ procedures.  Heparin level is therapeutic on 1450 units/hr.   Goal of Therapy:  Heparin level 0.3-0.7 units/ml Monitor platelets by anticoagulation protocol: Yes   Plan:  Continue Heparin at 1450 units/hr.  Follow daily heparin level and CBC.   [employer], Pharm.D., BCPS Clinical Pharmacist Pager: [phone] Clinical phone for 11/14/2017 from 8:30-4:00 is [phone]. After 4pm, please call [facility] [phone]) for assistance. 11/14/2017 11:43 AM

## 2017-11-15 DIAGNOSIS — I82402 Acute embolism and thrombosis of unspecified deep veins of left lower extremity: Secondary | ICD-10-CM

## 2017-11-15 DIAGNOSIS — R1030 Lower abdominal pain, unspecified: Secondary | ICD-10-CM

## 2017-11-15 LAB — CBC
HEMATOCRIT: 27.9 % — AB (ref 39.0–52.0)
Hemoglobin: 9 g/dL — ABNORMAL LOW (ref 13.0–17.0)
MCH: 30.3 pg (ref 26.0–34.0)
MCHC: 32.3 g/dL (ref 30.0–36.0)
MCV: 93.9 fL (ref 78.0–100.0)
Platelets: 467 10*3/uL — ABNORMAL HIGH (ref 150–400)
RBC: 2.97 MIL/uL — ABNORMAL LOW (ref 4.22–5.81)
RDW: 14.3 % (ref 11.5–15.5)
WBC: 11.9 10*3/uL — ABNORMAL HIGH (ref 4.0–10.5)

## 2017-11-15 LAB — HEPARIN LEVEL (UNFRACTIONATED): HEPARIN UNFRACTIONATED: 0.71 [IU]/mL — AB (ref 0.30–0.70)

## 2017-11-15 LAB — PROTIME-INR
INR: 1.12
Prothrombin Time: 14.3 seconds (ref 11.4–15.2)

## 2017-11-15 MED ORDER — ENOXAPARIN SODIUM 80 MG/0.8ML ~~LOC~~ SOLN
75.0000 mg | Freq: Two times a day (BID) | SUBCUTANEOUS | Status: DC
  Administered 2017-11-15 – 2017-11-16 (×2): 75 mg via SUBCUTANEOUS
  Filled 2017-11-15 (×2): qty 0.8

## 2017-11-15 NOTE — Progress Notes (Signed)
ANTICOAGULATION CONSULT NOTE - Follow-Up   Pharmacy Consult for heparin Indication: mechanical valve, hx LLE DVT, new RLE DVT, acute bilateral PEs, mMVR vegetation  No Known Allergies  Patient Measurements: Height: 6' (182.9 cm) Weight: 160 lb 0.9 oz (72.6 kg) IBW/kg (Calculated) : 77.6 Heparin Dosing Weight: 72.6 kg  Vital Signs: Temp: 99.1 F (37.3 C) (04/04 0434) Temp Source: Oral (04/04 0434) BP: 117/75 (04/04 0434) Pulse Rate: 99 (04/04 0434)  Labs: Recent Labs    11/13/17 0557 11/13/17 1857 11/14/17 0533 11/15/17 0346  HGB 8.7*  --  8.9* 9.0*  HCT 28.0*  --  28.7* 27.9*  PLT 411*  --  445* 467*  LABPROT 14.0  --  13.7 14.3  INR 1.09  --  1.06 1.12  HEPARINUNFRC 0.25* 0.26* 0.48 0.71*  CREATININE 0.95  --  0.93  --     Estimated Creatinine Clearance: 83.5 mL/min (by C-G formula based on SCr of 0.93 mg/dL).  Assessment: 62 y.o. male with h/o DVT and mechanical AVR/MVR, on Coumadin PTA. 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  Coumadin held for procedures. Patient was placed on heparin drip while undergoing further tests/ procedures.  Heparin level is just above goal at 0.71 on 1450 units/hr. No bleeding noted, CBC is stable.  Goal of Therapy:  Heparin level 0.3-0.7 units/ml Monitor platelets by anticoagulation protocol: Yes   Plan:  Decrease heparin drip to 1400 units/hr.  Follow daily heparin level and CBC. Monitor for s/sx of bleeding.  Plan is to change to Lovenox 1 mg/kg SQ q12h after TEE completed.   Renold Genta, PharmD, BCPS Clinical Pharmacist Clinical phone for 11/15/2017 until 4p is [phone] After 4p, please call [facility] at [phone] for assistance 11/15/2017 10:13 AM

## 2017-11-15 NOTE — Progress Notes (Signed)
Mr. Statzer seems to be doing pretty well this morning.  His  daughter and grandson are in the room this morning.  Sounds like he is awaiting TEE.  This I think is a good idea.  He is on heparin.  I would continue him on the heparin until any invasive procedure is complete.  Hopefully with the TEE will be the last invasive procedure.  He is on heparin because he is incredibly hypercoagulable.  He has a large pulmonary embolism and right lower extremity DVT.  He will need outpatient Lovenox with a twice daily dosing protocol.  I told him that with coronary emboli, twice daily dosing is more effective that single day dosing.  We have to make sure that he is eating well.  He is on Creon.  Hopefully this will help him digest and absorb better.  His labs show that his white cell count is 11.9.  Hemoglobin 9 and platelet count 467,000.  He did get a dose of IV iron yesterday.  His electrolytes showed potassium to be 4.6.  His creatinine is 0.93.  I did send off his pathologic specimen for genetic analysis.  The chance of being able to use targeted therapy probably is going to be quite low.    Hopefully, he will have the TEE today or tomorrow.  Then I would think that he could be discharged, as long as we are sure that he will have his Lovenox as an outpatient.  I really do not think that he needs statin drugs right now.  Lattie Haw, MD  [place] 28:6

## 2017-11-15 NOTE — Progress Notes (Signed)
PROGRESS NOTE  Jeff [name] TGG:[id number] DOB: 25-Jun-1956 DOA: 11/07/2017 PCP: Kerin Perna, NP   LOS: 6 days   Brief Narrative / Interim history: 62 year old with history of HTN, tobacco use, mechanical aortic valve replacement in the mis 1990s, prior CVA and LLE DVT in the setting of subtherapeutic INR who presents with abdominal pain, nausea and vomiting. He was found to have a pancreatic mass concerning for malignancy with liver mets. Oncology following, underwent liver biopsy on 4/1 showing adenocarcinoma likely pancreas primary. Had port placed 4/2. He also had a CT chest which showed bilateral PEs and is on IV heparin. Echo showed mitral valve vegetation.   Assessment & Plan: Principal Problem:   Abdominal pain Active Problems:   History of stroke   Hx of aortic valve replacement, mechanical   Hypertension   Smoker   Pancreatic mass   Rectal bleeding   Acute deep vein thrombosis (DVT) of left lower extremity (HCC)   Acute blood loss anemia   Malnutrition of moderate degree   Blood in stool   Pancreas cancer (HCC)   Abdominal pain in the setting of underlying pancreatic adenocarcinoma -oncology consulted, appreciate input, underwent liver biopsy and it showed adenocarcinoma -CEA elevated to 19, CA 19-9 322,500 -already underwent Port cath placement -Dr. Marin Olp following, probably plans for chemo as an outpatient   Acute Bilateral  lobar and subsegmental PE -in the setting of subtherapeutic INR, patient already on IV heparin.  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 Einar Gip with cardiology who will plan a TEE however there are no available slots until next  week, patient stable and this can be done as an outpatient -If blood cultures remained negative by tomorrow, can probably be discharged home on Lovenox and complete a TEE next week  History of mechanical Aortic and mitral valve replacement;  -on heparin gtt, discussed with Dr Marin Olp, ok to use lovenox in mechanical valve.  -Will start Lovenox, TEE will happen as an outpatient  Bright blood blood per rectum -Patient reports bright red blood with bowel movements for the last couple weeks -Hemoglobin overall is stable -GI consulted, no need for colonoscopy or any other procedures at this pointmonitor hb.   Acute blood loss anemia -hemoglobin stable, 8.9 this morning  -received IV iron   Hypertension -hold lisinopril and norvasc, now with diagnosis of PE. Monitor BP.   Left lower extremity DVT -While on Coumadin, he has a history of being subtherapeutic which is probably why he had a DVT. -We will prescribe Lovenox on discharge  Acute kidney injury -Resolved  Elevated LFTs -Likely due to liver metastases    DVT prophylaxis: heparin infusion Code Status: Full code Family Communication: daughter at bedside  Disposition Plan: home when ready   Consultants:   Oncology   IR  Cardiology - TEE  Procedures:   2D echo:  Study Conclusions - Left ventricle: The cavity size was normal. There was mild concentric hypertrophy. Systolic function was normal. The estimated ejection fraction was in the range of 50% to 55%. - Ventricular septum: These changes are consistent with a post-thoracotomy state. - Aortic valve: Mildly dilated annulus. A mechanical prosthesis was present. Cannot exclude vegetation. See MV description. There  was very mild stenosis. - Aorta: The aorta was mildly dilated. Aortic root dimension: 39 mm (ED). - Mitral valve: Moderately to severely calcified annulus. A mechanical prosthesis was present. Mobility was mildly restricted. There was a vegetation on  the left ventricular aspect; based on its appearance, thrombus or fibroelastoma cannot be excluded. There is fimbriated mass, multiple noted in the subvalvular apparatus involving the AV and a single mobile mass with appearance of calcified vegetation on the posterior papillary muscle, but could also represent mass. Recomment TEE for further evaluation. The findings are consistent with mild stenosis. Valve area by pressure half-time: 2.39 cm^2. - Left atrium: The atrium was mildly dilated.  Antimicrobials:  None    Subjective: -Initially feeling okay, however later on in the day after eating a [employer] patient developed nausea and vomiting  Objective: Vitals:   11/14/17 0551 11/14/17 1444 11/14/17 2107 11/15/17 0434  BP: 112/79 112/85 117/78 117/75  Pulse: 100 99 97 99  Resp: 18 14 17 18   Temp: 99.1 F (37.3 C) 98.7 F (37.1 C) 99.7 F (37.6 C) 99.1 F (37.3 C)  TempSrc: Oral Oral Oral Oral  SpO2: 95% 99% 97% 98%  Weight:      Height:       No intake or output data in the 24 hours ending 11/15/17 1218 Filed Weights   11/07/17 2105 11/08/17 1623  Weight: 72.6 kg (160 lb) 72.6 kg (160 lb 0.9 oz)    Examination:  Constitutional: No distress Eyes: No scleral icterus, lids and conjunctivae normal ENMT: Moist mucous membranes Respiratory: Clear to auscultation bilaterally without wheezing, no crackles. Cardiovascular: Regular rate and rhythm, mechanical click present, no edema Abdomen: Soft, nontender, nondistended, bowel sounds positive.  Skin: no rashes, lesions, ulcers. No induration Neurologic: non focal, ambulatory    Data Reviewed: I have independently reviewed following labs and imaging studies   CBC: Recent Labs  Lab 11/11/17 0733 11/12/17 0516 11/12/17 2125 11/13/17 0557 11/14/17 0533 11/15/17 0346  WBC 9.9 8.4  --  9.9 11.2* 11.9*  HGB 9.2* 9.4* 9.0* 8.7* 8.9* 9.0*  HCT 28.5* 28.7* 29.0* 28.0* 28.7* 27.9*  MCV 92.5 93.2  --  94.0 93.2 93.9  PLT 304 385  --   411* 445* 732*   Basic Metabolic Panel: Recent Labs  Lab 11/09/17 1202 11/11/17 0336 11/13/17 0557 11/14/17 0533  NA 136 136 136 135  K 4.1 3.9 4.1 4.6  CL 102 101 100* 98*  CO2 22 26 24 25   GLUCOSE 109* 116* 147* 129*  BUN 11 12 16 10   CREATININE 1.01 0.92 0.95 0.93  CALCIUM 8.9 8.6* 8.7* 8.9   GFR: Estimated Creatinine Clearance: 83.5 mL/min (by C-G formula based on SCr of 0.93 mg/dL). Liver Function Tests: Recent Labs  Lab 11/09/17 1202 11/11/17 0336 11/13/17 0557  AST 60* 41 51*  ALT 50 42 52  ALKPHOS 393* 369* 412*  BILITOT 1.3* 1.5* 0.9  PROT 6.4* 6.0* 6.4*  ALBUMIN 3.4* 2.9* 2.9*   No results for input(s): LIPASE, AMYLASE in the last 168 hours. No results for input(s): AMMONIA in the last 168 hours. Coagulation Profile: Recent Labs  Lab 11/11/17 0336 11/12/17 0516 11/13/17 0557 11/14/17 0533 11/15/17 0346  INR 1.27 1.14 1.09 1.06 1.12   Cardiac Enzymes: No results for input(s): CKTOTAL, CKMB, CKMBINDEX, TROPONINI in the last 168 hours. BNP (last 3 results) No results for input(s): PROBNP in the last 8760 hours. HbA1C: No results for input(s): HGBA1C in the last 72 hours. CBG: No  results for input(s): GLUCAP in the last 168 hours. Lipid Profile: No results for input(s): CHOL, HDL, LDLCALC, TRIG, CHOLHDL, LDLDIRECT in the last 72 hours. Thyroid Function Tests: No results for input(s): TSH, T4TOTAL, FREET4, T3FREE, THYROIDAB in the last 72 hours. Anemia Panel: Recent Labs    11/13/17 0557  FERRITIN 976*  TIBC 204*  IRON 25*   Urine analysis:    Component Value Date/Time   COLORURINE AMBER (A) 11/08/2017 0338   APPEARANCEUR HAZY (A) 11/08/2017 0338   LABSPEC >1.046 (H) 11/08/2017 0338   PHURINE 5.0 11/08/2017 0338   GLUCOSEU NEGATIVE 11/08/2017 0338   HGBUR SMALL (A) 11/08/2017 0338   BILIRUBINUR NEGATIVE 11/08/2017 0338   KETONESUR NEGATIVE 11/08/2017 0338   PROTEINUR 100 (A) 11/08/2017 0338   UROBILINOGEN 1.0 07/10/2013 1401    NITRITE NEGATIVE 11/08/2017 0338   LEUKOCYTESUR NEGATIVE 11/08/2017 0338   Sepsis Labs: Invalid input(s): PROCALCITONIN, LACTICIDVEN  Recent Results (from the past 240 hour(s))  Culture, blood (Routine X 2) w Reflex to ID Panel     Status: None (Preliminary result)   Collection Time: 11/14/17  7:58 AM  Result Value Ref Range Status   Specimen Description BLOOD LEFT ANTECUBITAL  Final   Special Requests   Final    BOTTLES DRAWN AEROBIC AND ANAEROBIC Blood Culture adequate volume   Culture   Final    NO GROWTH 1 DAY Performed at [facility], [address], [place], [place] [address]    Report Status PENDING  Incomplete  Culture, blood (Routine X 2) w Reflex to ID Panel     Status: None (Preliminary result)   Collection Time: 11/14/17  7:58 AM  Result Value Ref Range Status   Specimen Description BLOOD LEFT HAND  Final   Special Requests   Final    BOTTLES DRAWN AEROBIC AND ANAEROBIC Blood Culture adequate volume   Culture   Final    NO GROWTH 1 DAY Performed at [facility], [place] [address] [place], [place] [address]    Report Status PENDING  Incomplete      Radiology Studies: No results found. Scheduled Meds: . feeding supplement (ENSURE ENLIVE)  237 mL Oral BID BM  . lipase/protease/amylase  36,000 Units Oral TID AC  . pantoprazole  40 mg Oral BID  . polyethylene glycol  17 g Oral q morning - 10a   Continuous Infusions: . heparin 1,400 Units/hr (11/15/17 0829)    Marzetta Board, MD, PhD [facility] Pager [phone]  If 7PM-7AM, please contact night-coverage [email or web address] Password [email or web address] 11/15/2017, 12:18 PM

## 2017-11-15 NOTE — Progress Notes (Signed)
ANTICOAGULATION CONSULT NOTE  Pharmacy Consult for heparin > Lovenox Indication: mechanical valve, hx LLE DVT, new RLE DVT, acute bilateral PEs, mMVR vegetation  No Known Allergies  Patient Measurements: Height: 6' (182.9 cm) Weight: 160 lb 0.9 oz (72.6 kg) IBW/kg (Calculated) : 77.6 Heparin Dosing Weight: 72.6 kg  Labs: Recent Labs    11/13/17 0557 11/13/17 1857 11/14/17 0533 11/15/17 0346  HGB 8.7*  --  8.9* 9.0*  HCT 28.0*  --  28.7* 27.9*  PLT 411*  --  445* 467*  LABPROT 14.0  --  13.7 14.3  INR 1.09  --  1.06 1.12  HEPARINUNFRC 0.25* 0.26* 0.48 0.71*  CREATININE 0.95  --  0.93  --     Estimated Creatinine Clearance: 83.5 mL/min (by C-G formula based on SCr of 0.93 mg/dL).  Assessment: 62 y.o. male with h/o DVT and mechanical AVR/MVR, on Coumadin PTA. 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  Coumadin held for procedures. Patient was placed on heparin drip while undergoing further tests/ procedures. Now to transition to Lovenox to prepare for discharge.  CrCl >82mL/min. Hgb low but stable.  Goal of Therapy:  Anti-Xa level 0.6-1 units/ml 4hrs after LMWH dose given Monitor platelets by anticoagulation protocol: Yes   Plan:  Stop heparin infusion at 1759 Start Lovenox 1mg /kg (75mg ) subQ q12h starting at 1800 tonight CBC in the morning Follow for s/s bleeding  Keondria Siever D. Aradhana Gin, PharmD, [facility] Clinical Pharmacist [phone] 11/15/2017 4:09 PM

## 2017-11-16 DIAGNOSIS — C787 Secondary malignant neoplasm of liver and intrahepatic bile duct: Principal | ICD-10-CM

## 2017-11-16 DIAGNOSIS — C259 Malignant neoplasm of pancreas, unspecified: Secondary | ICD-10-CM

## 2017-11-16 DIAGNOSIS — I10 Essential (primary) hypertension: Secondary | ICD-10-CM

## 2017-11-16 LAB — CBC
HCT: 29.8 % — ABNORMAL LOW (ref 39.0–52.0)
Hemoglobin: 9.3 g/dL — ABNORMAL LOW (ref 13.0–17.0)
MCH: 29.4 pg (ref 26.0–34.0)
MCHC: 31.2 g/dL (ref 30.0–36.0)
MCV: 94.3 fL (ref 78.0–100.0)
Platelets: 510 10*3/uL — ABNORMAL HIGH (ref 150–400)
RBC: 3.16 MIL/uL — ABNORMAL LOW (ref 4.22–5.81)
RDW: 14.3 % (ref 11.5–15.5)
WBC: 11.4 10*3/uL — AB (ref 4.0–10.5)

## 2017-11-16 LAB — PROTIME-INR
INR: 1.08
PROTHROMBIN TIME: 13.9 s (ref 11.4–15.2)

## 2017-11-16 MED ORDER — ENOXAPARIN (LOVENOX) PATIENT EDUCATION KIT
PACK | Freq: Once | Status: DC
  Filled 2017-11-16: qty 1

## 2017-11-16 MED ORDER — OXYCODONE HCL 5 MG PO TABS: 5.0000 mg | ORAL_TABLET | Freq: Three times a day (TID) | ORAL | 0 refills | Status: DC | PRN

## 2017-11-16 MED ORDER — ENOXAPARIN SODIUM 80 MG/0.8ML ~~LOC~~ SOLN: 75.0000 mg | Freq: Two times a day (BID) | SUBCUTANEOUS | 2 refills | Status: DC

## 2017-11-16 MED ORDER — PANCRELIPASE (LIP-PROT-AMYL) 36000-114000 UNITS PO CPEP: 36000.0000 [IU] | ORAL_CAPSULE | Freq: Three times a day (TID) | ORAL | 1 refills | Status: AC

## 2017-11-16 NOTE — Progress Notes (Signed)
Patient discharge via wheelchair with daughter.  Iv access removed and compression dressing applied.  Discharge instructions regarding follow up reviewed in detail with pt and daughter.  Prescriptions given to pt.  Instructions on keeping port/wound area clean and free from contamination and infection control reinforced.  Patient questions answered, pt agreed and verbalized understanding.

## 2017-11-16 NOTE — Plan of Care (Signed)
  Problem: Pain Managment: Goal: General experience of comfort will improve Outcome: Progressing  Pain goals accepted by pt.  Additional measure such as heat packs and repositioning accepted in addition to pain meds accepted.

## 2017-11-16 NOTE — Discharge Instructions (Signed)
Follow with Kerin Perna, NP in 2-3 weeks  Please get a complete blood count and chemistry panel checked by your Primary MD at your next visit, and again as instructed by your Primary MD. Please get your medications reviewed and adjusted by your Primary MD.  Please request your Primary MD to go over all Hospital Tests and Procedure/Radiological results at the follow up, please get all Hospital records sent to your Prim MD by signing hospital release before you go home.  If you had Pneumonia of Lung problems at the Hospital: Please get a 2 view Chest X ray done in 6-8 weeks after hospital discharge or sooner if instructed by your Primary MD.  If you have Congestive Heart Failure: Please call your Cardiologist or Primary MD anytime you have any of the following symptoms:  1) 3 pound weight gain in 24 hours or 5 pounds in 1 week  2) shortness of breath, with or without a dry hacking cough  3) swelling in the hands, feet or stomach  4) if you have to sleep on extra pillows at night in order to breathe  Follow cardiac low salt diet and 1.5 lit/day fluid restriction.  If you have diabetes Accuchecks 4 times/day, Once in AM empty stomach and then before each meal. Log in all results and show them to your primary doctor at your next visit. If any glucose reading is under 80 or above 300 call your primary MD immediately.  If you have Seizure/Convulsions/Epilepsy: Please do not drive, operate heavy machinery, participate in activities at heights or participate in high speed sports until you have seen by Primary MD or a Neurologist and advised to do so again.  If you had Gastrointestinal Bleeding: Please ask your Primary MD to check a complete blood count within one week of discharge or at your next visit. Your endoscopic/colonoscopic biopsies that are pending at the time of discharge, will also need to followed by your Primary MD.  Get Medicines reviewed and adjusted. Please take all your  medications with you for your next visit with your Primary MD  Please request your Primary MD to go over all hospital tests and procedure/radiological results at the follow up, please ask your Primary MD to get all Hospital records sent to his/her office.  If you experience worsening of your admission symptoms, develop shortness of breath, life threatening emergency, suicidal or homicidal thoughts you must seek medical attention immediately by calling 911 or calling your MD immediately  if symptoms less severe.  You must read complete instructions/literature along with all the possible adverse reactions/side effects for all the Medicines you take and that have been prescribed to you. Take any new Medicines after you have completely understood and accpet all the possible adverse reactions/side effects.   Do not drive or operate heavy machinery when taking Pain medications.   Do not take more than prescribed Pain, Sleep and Anxiety Medications  Special Instructions: If you have smoked or chewed Tobacco  in the last 2 yrs please stop smoking, stop any regular Alcohol  and or any Recreational drug use.  Wear Seat belts while driving.  Please note You were cared for by a hospitalist during your hospital stay. If you have any questions about your discharge medications or the care you received while you were in the hospital after you are discharged, you can call the unit and asked to speak with the hospitalist on call if the hospitalist that took care of you is not available.  Once you are discharged, your primary care physician will handle any further medical issues. Please note that NO REFILLS for any discharge medications will be authorized once you are discharged, as it is imperative that you return to your primary care physician (or establish a relationship with a primary care physician if you do not have one) for your aftercare needs so that they can reassess your need for medications and monitor your  lab values.  You can reach the hospitalist office at phone [phone] or fax [phone]   If you do not have a primary care physician, you can call [phone] for a physician referral.  Activity: As tolerated with Full fall precautions use walker/cane & assistance as needed  Diet: regular  Disposition Home

## 2017-11-16 NOTE — Discharge Summary (Signed)
Physician Discharge Summary  Jeff [name] XLK:[id number] DOB: 1956/04/13 DOA: 11/07/2017  PCP: Kerin Perna, NP  Admit date: 11/07/2017 Discharge date: 11/16/2017  Admitted From: home Disposition:  home  Recommendations for Outpatient Follow-up:  1. Follow up with Dr. Marin Olp in 1-2 weeks 2. Follow up with Dr. Einar Gip next week for a TEE  Home Health: none Equipment/Devices: none  Discharge Condition: stable CODE STATUS: Full code Diet recommendation: regular  HPI: Per Dr. Glyn [name], Jeff [name] is a 62 y.o. male with history of hypertension, tobacco abuse, mechanical aortic and mitral valve replacement, history of stroke in December 2018 4 months ago and recently diagnosed left lower extremity DVT presently on Lovenox bridging with Coumadin presents to the ER with complaints of abdominal pain.  Patient states over the last 2 weeks patient has been having progressive epigastric pain with nausea vomiting up to eat he eats each time.  Also has lost weight.  Patient also last few days has noticed some blood in his stools while wiping.  Epigastric pain is sharp sometimes radiating to the back.  Denies any chest pain or shortness of breath. 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  Stool for occult blood was positive.  Hemoglobin has dropped by 2 g from previous.  Patient is being admitted for further management.  Hospital Course: Abdominal pain in the setting of underlying pancreatic adenocarcinoma -oncology consulted, appreciate input, underwent liver biopsy and it showed adenocarcinoma, CEA elevated to 19, CA 19-9 322,500, already underwent Port cath placement, Dr. 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  Patient is afebrile, blood cultures are negative, he is to get a TEE as an outpatient next week with Dr. Einar Gip History of mechanical Aortic and mitral valve replacement; -on heparin gtt initially, discussed with Dr Marin Olp, ok to use lovenox in mechanical valve. Bright blood blood per rectum -Patient reports bright red blood with bowel movements for the last couple weeks, hemoglobin overall was stable, GI consulted, no need for colonoscopy or any other procedures at this pointmonitor hb.  Anemia, multifactorial, likely in the setting of malignancy -hemoglobin stable while anticoagulated.  Hypertension -resume home medications Left lower extremity DVT -While on Coumadin, he has a history of being subtherapeutic which is probably why he had a DVT. Now on Lovenox Acute kidney injury -Resolved Elevated LFTs -Likely due to liver metastases  Discharge Diagnoses:  Principal Problem:   Abdominal pain Active Problems:   History of stroke   Hx of aortic valve replacement, mechanical   Hypertension   Smoker   Pancreatic mass   Rectal bleeding   Acute deep vein thrombosis (DVT) of left lower extremity (HCC)   Acute blood loss anemia   Malnutrition of moderate degree   Blood in stool   Pancreas cancer [facility])   Discharge Instructions   Allergies as of 11/16/2017   No Known Allergies     Medication List    STOP taking these medications   warfarin 10 MG tablet Commonly known as:  COUMADIN   warfarin 7.5 MG tablet Commonly known as:  COUMADIN     TAKE these medications   amLODipine 5 MG tablet Commonly known as:  NORVASC  Take 1 tablet (5 mg total) by mouth daily with supper.   aspirin 81 MG EC tablet Take 1 tablet (81 mg total) by mouth daily.   enoxaparin 80 MG/0.8ML injection Commonly known as:  LOVENOX Inject 0.75 mLs (75 mg total) into the skin every 12 (twelve) hours.     lipase/protease/amylase 36000 UNITS Cpep capsule Commonly known as:  CREON Take 1 capsule (36,000 Units total) by mouth 3 (three) times daily before meals.   lisinopril 20 MG tablet Commonly known as:  PRINIVIL,ZESTRIL Take 1 tablet (20 mg total) by mouth daily with supper.   oxyCODONE 5 MG immediate release tablet Commonly known as:  ROXICODONE Take 1 tablet (5 mg total) by mouth every 8 (eight) hours as needed for up to 20 doses.   pravastatin 20 MG tablet Commonly known as:  PRAVACHOL Take 20 mg by mouth daily with supper.   tadalafil 5 MG tablet Commonly known as:  CIALIS Take 5 mg by mouth daily as needed for erectile dysfunction.      Follow-up Information    Volanda Napoleon, MD. Schedule an appointment as soon as possible for a visit in 1 week(s).   Specialty:  Oncology Contact information: [address] [place] [address] [phone]        Adrian Prows, MD Follow up in 1 week(s).   Specialty:  Cardiology Contact information: [address] [place] [address] [phone]           Consultations:  Oncology   IR  Cardiology   Procedures/Studies:  2D echo  Study Conclusions - Left ventricle: The cavity size was normal. There was mild concentric hypertrophy. Systolic function was normal. The estimated ejection fraction was in the range of 50% to 55%. - Ventricular septum: These changes are consistent with a post-thoracotomy state. - Aortic valve: Mildly dilated annulus. A mechanical prosthesis was present. Cannot exclude vegetation. See MV description. There was very mild stenosis. - Aorta: The aorta was mildly dilated. Aortic root dimension: 39 mm   (ED). - Mitral valve: Moderately to severely calcified annulus. A mechanical prosthesis was present. Mobility was mildly restricted. There was a vegetation on the left ventricular aspect; based on its appearance, thrombus or fibroelastoma cannot be excluded. There is  fimbriated mass, multiple noted in the subvalvular apparatus involving the AV and a single mobile mass with appearance of calcified vegetation on the posterior papillary muscle, but could also represent mass. Recomment TEE for further evaluation. The findings are consistent with mild stenosis. Valve area by pressure half-time: 2.39 cm^2. - Left atrium: The atrium was mildly dilated.  Dg Chest 2 View  Result Date: 11/07/2017 CLINICAL DATA:  Abdominal pain with nausea vomiting EXAM: CHEST - 2 VIEW COMPARISON:  03/21/2015 FINDINGS: Post sternotomy changes. Scarring at the lingula and left base. Cardiomediastinal silhouette within normal limits. Aortic atherosclerosis. No pneumothorax. IMPRESSION: No active cardiopulmonary disease. Scarring at the lingula and left base. Electronically Signed   By: Donavan Foil M.D.   On: 11/07/2017 21:42   Ct Chest W Contrast  Result Date: 11/13/2017 CLINICAL DATA:  Evaluate for metastatic disease.  Pancreas neoplasm. EXAM: CT CHEST WITH CONTRAST TECHNIQUE: Multidetector CT imaging of the chest was performed during intravenous contrast administration. CONTRAST:  8mL ISOVUE-300 IOPAMIDOL (ISOVUE-300) INJECTION 61% COMPARISON:  CT chest 4/23/5 FINDINGS: Cardiovascular: Previous median sternotomy and aortic and mitral valve replacement. The ascending thoracic aorta Measures 4.9 cm in diameter, image 76/3. Previously 4.4 cm. Large  filling defect within the right lower lobar pulmonary artery and its main branches identified compatible with acute embolus. There are filling defects within the lingular pulmonary artery as well as segmental branches of the left lower lobe pulmonary artery. Mediastinum/Nodes: The trachea appears patent and is midline. Normal appearance of the esophagus. No enlarged mediastinal or hilar lymph nodes. Lungs/Pleura: No pleural effusion. No airspace consolidation atelectasis or pneumothorax. 5 mm perifissural nodule in the right lower lobe is new from  previous exam image 75/4. Perifissural nodule within the left lower lobe measures 5 mm, image 74/4. Right upper lobe pulmonary artery measures 5 mm, image 58/4. New from previous exam. Upper Abdomen: Extensive liver metastases are again noted as seen on 11/07/2017. Not significantly changed. No acute abnormality identified. Musculoskeletal: No chest wall abnormality. No acute or significant osseous findings. IMPRESSION: 1. There are few small nonspecific pulmonary nodules. The largest is in the right upper lobe measuring 5 mm. Not identified on previous chest CT from 12/05/03. 2. Exam positive for acute bilateral lobar and segmental pulmonary emboli. Critical Value/emergent results were called by telephone at the time of interpretation on 11/13/2017 at 8:46 am to Dr. Tyrell Antonio, who verbally acknowledged these results. 3.  Aortic Atherosclerosis (ICD10-I70.0). 4. Liver metastasis as noted on 03/27/noted. 5. Ascending thoracic aortic aneurysm. Recommend semi-annual imaging followup by CTA or MRA and referral to cardiothoracic surgery if not already obtained. This recommendation follows 2010 ACCF/AHA/AATS/ACR/ASA/SCA/SCAI/SIR/STS/SVM Guidelines for the Diagnosis and Management of Patients With Thoracic Aortic Disease. Circulation. 2010; 121: X540-G867 Electronically Signed   By: Kerby Moors M.D.   On: 11/13/2017 08:50   Ct Abdomen Pelvis W Contrast  Result Date: 11/08/2017 CLINICAL DATA:  62 year old male with acute abdominal pain. Nausea vomiting. EXAM: CT ABDOMEN AND PELVIS WITH CONTRAST TECHNIQUE: Multidetector CT imaging of the abdomen and pelvis was performed using the standard protocol following bolus administration of intravenous contrast. CONTRAST:  165mL ISOVUE-300 IOPAMIDOL (ISOVUE-300) INJECTION 61% COMPARISON:  Abdominal CT dated 11/30/2003 FINDINGS: Lower chest: The visualized lung bases are clear. No intra-abdominal free air or free fluid. Hepatobiliary: Multiple (greater than 20) hepatic hypodense  masses measure up to 4 x 5 cm in segment VI consistent with metastatic disease. No intrahepatic biliary ductal dilatation. The gallbladder is unremarkable. Pancreas: There is a 4.7 x 6.3 cm lobulated hypoenhancing mass in the distal body and tail of the pancreas consistent with malignancy. There is extension of the mass to the greater curvature of the stomach with loss of fat plane. Spleen: Normal in size without focal abnormality. Adrenals/Urinary Tract: The adrenal glands are unremarkable. A 1 cm exophytic left renal hypodense lesion is not well characterized but likely represents a cyst. Bilateral renal cortical irregularity and scarring. There is no hydronephrosis on either side. The visualized ureters and urinary bladder appear unremarkable. Stomach/Bowel: Liquid content noted in the distal esophagus may represent gastroesophageal reflux or esophageal dysmotility. There is no bowel obstruction or active inflammation. Normal appendix. Vascular/Lymphatic: Mild aortoiliac atherosclerotic disease. The origins of the celiac axis, SMA, IMA appear patent. The SMV, and main portal vein are patent. The splenic vein is not visualized, likely occluded by the pancreatic mass. Top-normal lymph nodes in the region of the porta hepatis. Reproductive: The prostate and seminal vesicles are grossly unremarkable. Other: None Musculoskeletal: No acute osseous pathology. IMPRESSION: 1. Lobulated mass in the distal body and tail of the pancreas consistent with malignancy. There is extension of the mass to the greater curvature of the stomach with loss of fat plane.  2. Multiple hepatic metastatic disease. 3. Top-normal lymph nodes in the porta pedis. 4. 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 Electronically Signed   By: Anner Crete M.D.   On: 11/08/2017 00:34   US Biopsy (liver)  Result Date: 11/12/2017 CLINICAL DATA:  Pancreatic mass and multiple liver lesions. The patient presents for liver biopsy. EXAM:  ULTRASOUND GUIDED CORE BIOPSY OF LIVER MEDICATIONS: 2.0 mg IV Versed; 50 mcg IV Fentanyl Total Moderate Sedation Time: 20 minutes. The patient's level of consciousness and physiologic status were continuously monitored during the procedure by Radiology nursing. PROCEDURE: The procedure, risks, benefits, and alternatives were explained to the patient. Questions regarding the procedure were encouraged and answered. The patient understands and consents to the procedure. A time out was performed prior to initiating the procedure. The abdominal wall was prepped with chlorhexidine in a sterile fashion, and a sterile drape was applied covering the operative field. A sterile gown and sterile gloves were used for the procedure. Local anesthesia was provided with 1% Lidocaine. Ultrasound was used to localize lesions throughout the liver. A lesion in the left lobe of the liver was targeted. A 17 gauge needle was advanced under ultrasound guidance into the left lobe parenchyma. Three separate 18 gauge core biopsy samples were obtained through the outer needle. Core biopsy samples were submitted in formalin. Additional ultrasound was performed after outer needle removal. COMPLICATIONS: None. FINDINGS: Multiple ill-defined hypoechoic lesions are seen throughout the liver parenchyma. A lesion measuring 1.5 cm in estimated diameter was targeted due to clear border delineation. Solid tissue was obtained. IMPRESSION: Ultrasound-guided core biopsy performed of a lesion within the left lobe of the liver. Electronically Signed   By: Aletta Edouard M.D.   On: 11/12/2017 15:27   Ir US Guide Vasc Access Right  Result Date: 11/13/2017 CLINICAL DATA:  Pancreatic cancer EXAM: TUNNEL POWER PORT PLACEMENT WITH SUBCUTANEOUS POCKET UTILIZING ULTRASOUND & FLOUROSCOPY FLUOROSCOPY TIME:  6 seconds.  One mGy. MEDICATIONS AND MEDICAL HISTORY: Versed 2 mg, Fentanyl 75 mcg. Additional Medications: Ancef 2 g. Antibiotics were given within 2 hours of  the procedure. ANESTHESIA/SEDATION: Moderate sedation time: 27 minutes. Nursing monitored the the patient during the procedure. PROCEDURE: After written informed consent was obtained, patient was placed in the supine position on angiographic table. The right neck and chest was prepped and draped in a sterile fashion. Lidocaine was utilized for local anesthesia. The right jugular vein was noted to be patent initially with ultrasound. Under sonographic guidance, a micropuncture needle was inserted into the right IJ vein (Ultrasound and fluoroscopic image documentation was performed). The needle was removed over an 018 wire which was exchanged for a Amplatz. This was advanced into the IVC. An 8-French dilator was advanced over the Amplatz. A small incision was made in the right upper chest over the anterior right second rib. Utilizing blunt dissection, a subcutaneous pocket was created in the caudal direction. The pocket was irrigated with a copious amount of sterile normal saline. The port catheter was tunneled from the chest incision, and out the neck incision. The reservoir was inserted into the subcutaneous pocket and secured with two 3-0 Ethilon stitches. A peel-away sheath was advanced over the Amplatz wire. The port catheter was cut to measure length and inserted through the peel-away sheath. The peel-away sheath was removed. The chest incision was closed with 3-0 Vicryl interrupted stitches for the subcutaneous tissue and a running of 4-0 Vicryl subcuticular stitch for the skin. The neck incision was closed  with a 4-0 Vicryl subcuticular stitch. Derma-bond was applied to both surgical incisions. The port reservoir was flushed and instilled with heparinized saline. No complications. FINDINGS: A right IJ vein Port-A-Cath is in place with its tip at the cavoatrial junction. COMPLICATIONS: None IMPRESSION: Successful 8 French right internal jugular vein power port placement with its tip at the SVC/RA junction.  Electronically Signed   By: Marybelle Killings M.D.   On: 11/13/2017 10:31   Ir Fluoro Guide Port Insertion Right  Result Date: 11/13/2017 CLINICAL DATA:  Pancreatic cancer EXAM: TUNNEL POWER PORT PLACEMENT WITH SUBCUTANEOUS POCKET UTILIZING ULTRASOUND & FLOUROSCOPY FLUOROSCOPY TIME:  6 seconds.  One mGy. MEDICATIONS AND MEDICAL HISTORY: Versed 2 mg, Fentanyl 75 mcg. Additional Medications: Ancef 2 g. Antibiotics were given within 2 hours of the procedure. ANESTHESIA/SEDATION: Moderate sedation time: 27 minutes. Nursing monitored the the patient during the procedure. PROCEDURE: After written informed consent was obtained, patient was placed in the supine position on angiographic table. The right neck and chest was prepped and draped in a sterile fashion. Lidocaine was utilized for local anesthesia. The right jugular vein was noted to be patent initially with ultrasound. Under sonographic guidance, a micropuncture needle was inserted into the right IJ vein (Ultrasound and fluoroscopic image documentation was performed). The needle was removed over an 018 wire which was exchanged for a Amplatz. This was advanced into the IVC. An 8-French dilator was advanced over the Amplatz. A small incision was made in the right upper chest over the anterior right second rib. Utilizing blunt dissection, a subcutaneous pocket was created in the caudal direction. The pocket was irrigated with a copious amount of sterile normal saline. The port catheter was tunneled from the chest incision, and out the neck incision. The reservoir was inserted into the subcutaneous pocket and secured with two 3-0 Ethilon stitches. A peel-away sheath was advanced over the Amplatz wire. The port catheter was cut to measure length and inserted through the peel-away sheath. The peel-away sheath was removed. The chest incision was closed with 3-0 Vicryl interrupted stitches for the subcutaneous tissue and a running of 4-0 Vicryl subcuticular stitch for the  skin. The neck incision was closed with a 4-0 Vicryl subcuticular stitch. Derma-bond was applied to both surgical incisions. The port reservoir was flushed and instilled with heparinized saline. No complications. FINDINGS: A right IJ vein Port-A-Cath is in place with its tip at the cavoatrial junction. COMPLICATIONS: None IMPRESSION: Successful 8 French right internal jugular vein power port placement with its tip at the SVC/RA junction. Electronically Signed   By: Marybelle Killings M.D.   On: 11/13/2017 10:31      Subjective: - no chest pain, shortness of breath, no abdominal pain, nausea or vomiting. Wants to go home   Discharge Exam: Vitals:   11/15/17 2105 11/16/17 0448  BP: (!) 118/92 122/86  Pulse: 95 (!) 101  Resp: 18 18  Temp: 99.6 F (37.6 C) 99.4 F (37.4 C)  SpO2: 98% 100%    General: Pt is alert, awake, not in acute distress Cardiovascular: RRR, S1/S2 +, no rubs, no gallops Respiratory: CTA bilaterally, no wheezing, no rhonchi Abdominal: Soft, NT, ND, bowel sounds + Extremities: no edema, no cyanosis    The results of significant diagnostics from this hospitalization (including imaging, microbiology, ancillary and laboratory) are listed below for reference.     Microbiology: Recent Results (from the past 240 hour(s))  Culture, blood (Routine X 2) w Reflex to ID Panel  Status: None (Preliminary result)   Collection Time: 11/14/17  7:58 AM  Result Value Ref Range Status   Specimen Description BLOOD LEFT ANTECUBITAL  Final   Special Requests   Final    BOTTLES DRAWN AEROBIC AND ANAEROBIC Blood Culture adequate volume   Culture   Final    NO GROWTH 2 DAYS Performed at [facility], [address], [place], [place] [address]    Report Status PENDING  Incomplete  Culture, blood (Routine X 2) w Reflex to ID Panel     Status: None (Preliminary result)   Collection Time: 11/14/17  7:58 AM  Result Value Ref Range Status   Specimen Description BLOOD LEFT HAND   Final   Special Requests   Final    BOTTLES DRAWN AEROBIC AND ANAEROBIC Blood Culture adequate volume   Culture   Final    NO GROWTH 2 DAYS Performed at [facility], [place] [address], [place], [place] [address]    Report Status PENDING  Incomplete     Labs: BNP (last 3 results) No results for input(s): BNP in the last 8760 hours. Basic Metabolic Panel: Recent Labs  Lab 11/11/17 0336 11/13/17 0557 11/14/17 0533  NA 136 136 135  K 3.9 4.1 4.6  CL 101 100* 98*  CO2 26 24 25   GLUCOSE 116* 147* 129*  BUN 12 16 10   CREATININE 0.92 0.95 0.93  CALCIUM 8.6* 8.7* 8.9   Liver Function Tests: Recent Labs  Lab 11/11/17 0336 11/13/17 0557  AST 41 51*  ALT 42 52  ALKPHOS 369* 412*  BILITOT 1.5* 0.9  PROT 6.0* 6.4*  ALBUMIN 2.9* 2.9*   No results for input(s): LIPASE, AMYLASE in the last 168 hours. No results for input(s): AMMONIA in the last 168 hours. CBC: Recent Labs  Lab 11/12/17 0516 11/12/17 2125 11/13/17 0557 11/14/17 0533 11/15/17 0346 11/16/17 0707  WBC 8.4  --  9.9 11.2* 11.9* 11.4*  HGB 9.4* 9.0* 8.7* 8.9* 9.0* 9.3*  HCT 28.7* 29.0* 28.0* 28.7* 27.9* 29.8*  MCV 93.2  --  94.0 93.2 93.9 94.3  PLT 385  --  411* 445* 467* 510*   Cardiac Enzymes: No results for input(s): CKTOTAL, CKMB, CKMBINDEX, TROPONINI in the last 168 hours. BNP: Invalid input(s): POCBNP CBG: No results for input(s): GLUCAP in the last 168 hours. D-Dimer No results for input(s): DDIMER in the last 72 hours. Hgb A1c No results for input(s): HGBA1C in the last 72 hours. Lipid Profile No results for input(s): CHOL, HDL, LDLCALC, TRIG, CHOLHDL, LDLDIRECT in the last 72 hours. Thyroid function studies No results for input(s): TSH, T4TOTAL, T3FREE, THYROIDAB in the last 72 hours.  Invalid input(s): FREET3 Anemia work up No results for input(s): VITAMINB12, FOLATE, FERRITIN, TIBC, IRON, RETICCTPCT in the last 72 hours. Urinalysis    Component Value Date/Time   COLORURINE AMBER  (A) 11/08/2017 0338   APPEARANCEUR HAZY (A) 11/08/2017 0338   LABSPEC >1.046 (H) 11/08/2017 0338   PHURINE 5.0 11/08/2017 0338   GLUCOSEU NEGATIVE 11/08/2017 0338   HGBUR SMALL (A) 11/08/2017 0338   BILIRUBINUR NEGATIVE 11/08/2017 0338   KETONESUR NEGATIVE 11/08/2017 0338   PROTEINUR 100 (A) 11/08/2017 0338   UROBILINOGEN 1.0 07/10/2013 1401   NITRITE NEGATIVE 11/08/2017 0338   LEUKOCYTESUR NEGATIVE 11/08/2017 0338   Sepsis Labs Invalid input(s): PROCALCITONIN,  WBC,  LACTICIDVEN   Time coordinating discharge: 45 minutes  SIGNED:  Marzetta Board, MD  [facility] 11/16/2017, 4:34 PM Pager [phone]  If  7PM-7AM, please contact night-coverage [email or web address] Password [email or web address]

## 2017-11-19 LAB — CULTURE, BLOOD (ROUTINE X 2)
CULTURE: NO GROWTH
Culture: NO GROWTH
SPECIAL REQUESTS: ADEQUATE
SPECIAL REQUESTS: ADEQUATE

## 2017-11-22 ENCOUNTER — Ambulatory Visit (HOSPITAL_COMMUNITY): Payer: Medicare Other | Admitting: Anesthesiology

## 2017-11-22 ENCOUNTER — Encounter (HOSPITAL_COMMUNITY): Payer: Self-pay

## 2017-11-22 ENCOUNTER — Other Ambulatory Visit: Payer: Self-pay

## 2017-11-22 ENCOUNTER — Ambulatory Visit (HOSPITAL_COMMUNITY): Payer: Medicare Other

## 2017-11-22 ENCOUNTER — Ambulatory Visit (HOSPITAL_COMMUNITY)
Admission: RE | Admit: 2017-11-22 | Discharge: 2017-11-22 | Disposition: A | Discharge status: Home / Self Care | Payer: Medicare Other | Source: Ambulatory Visit | Attending: Cardiology | Admitting: Cardiology

## 2017-11-22 ENCOUNTER — Encounter (HOSPITAL_COMMUNITY)
Admission: RE | Disposition: A | Discharge status: Home / Self Care | Payer: Self-pay | Source: Ambulatory Visit | Attending: Cardiology

## 2017-11-22 DIAGNOSIS — C259 Malignant neoplasm of pancreas, unspecified: Secondary | ICD-10-CM | POA: Insufficient documentation

## 2017-11-22 DIAGNOSIS — I712 Thoracic aortic aneurysm, without rupture: Secondary | ICD-10-CM | POA: Diagnosis not present

## 2017-11-22 DIAGNOSIS — Z8249 Family history of ischemic heart disease and other diseases of the circulatory system: Secondary | ICD-10-CM | POA: Diagnosis not present

## 2017-11-22 DIAGNOSIS — I10 Essential (primary) hypertension: Secondary | ICD-10-CM | POA: Diagnosis not present

## 2017-11-22 DIAGNOSIS — Z8673 Personal history of transient ischemic attack (TIA), and cerebral infarction without residual deficits: Secondary | ICD-10-CM | POA: Insufficient documentation

## 2017-11-22 DIAGNOSIS — I4589 Other specified conduction disorders: Secondary | ICD-10-CM | POA: Insufficient documentation

## 2017-11-22 DIAGNOSIS — Z86718 Personal history of other venous thrombosis and embolism: Secondary | ICD-10-CM | POA: Insufficient documentation

## 2017-11-22 DIAGNOSIS — Z7901 Long term (current) use of anticoagulants: Secondary | ICD-10-CM | POA: Insufficient documentation

## 2017-11-22 DIAGNOSIS — R531 Weakness: Secondary | ICD-10-CM | POA: Insufficient documentation

## 2017-11-22 DIAGNOSIS — I429 Cardiomyopathy, unspecified: Secondary | ICD-10-CM | POA: Insufficient documentation

## 2017-11-22 DIAGNOSIS — Z7982 Long term (current) use of aspirin: Secondary | ICD-10-CM | POA: Diagnosis not present

## 2017-11-22 DIAGNOSIS — E7800 Pure hypercholesterolemia, unspecified: Secondary | ICD-10-CM | POA: Insufficient documentation

## 2017-11-22 DIAGNOSIS — Z79899 Other long term (current) drug therapy: Secondary | ICD-10-CM | POA: Diagnosis not present

## 2017-11-22 DIAGNOSIS — R0602 Shortness of breath: Secondary | ICD-10-CM | POA: Insufficient documentation

## 2017-11-22 DIAGNOSIS — E785 Hyperlipidemia, unspecified: Secondary | ICD-10-CM | POA: Insufficient documentation

## 2017-11-22 DIAGNOSIS — I739 Peripheral vascular disease, unspecified: Secondary | ICD-10-CM | POA: Diagnosis not present

## 2017-11-22 DIAGNOSIS — I4892 Unspecified atrial flutter: Secondary | ICD-10-CM | POA: Diagnosis not present

## 2017-11-22 DIAGNOSIS — Z952 Presence of prosthetic heart valve: Secondary | ICD-10-CM | POA: Insufficient documentation

## 2017-11-22 DIAGNOSIS — R011 Cardiac murmur, unspecified: Secondary | ICD-10-CM | POA: Diagnosis not present

## 2017-11-22 DIAGNOSIS — C787 Secondary malignant neoplasm of liver and intrahepatic bile duct: Secondary | ICD-10-CM | POA: Diagnosis not present

## 2017-11-22 DIAGNOSIS — F17210 Nicotine dependence, cigarettes, uncomplicated: Secondary | ICD-10-CM | POA: Diagnosis not present

## 2017-11-22 HISTORY — PX: TEE WITHOUT CARDIOVERSION: SHX5443

## 2017-11-22 SURGERY — ECHOCARDIOGRAM, TRANSESOPHAGEAL
Anesthesia: Monitor Anesthesia Care

## 2017-11-22 MED ORDER — OXYCODONE HCL 5 MG PO TABS: 5.0000 mg | ORAL_TABLET | Freq: Three times a day (TID) | ORAL | 0 refills | Status: DC | PRN

## 2017-11-22 MED ORDER — DILTIAZEM HCL ER COATED BEADS 180 MG PO CP24: 180.0000 mg | ORAL_CAPSULE | Freq: Every day | ORAL | 1 refills | Status: AC

## 2017-11-22 MED ORDER — PHENYLEPHRINE 40 MCG/ML (10ML) SYRINGE FOR IV PUSH (FOR BLOOD PRESSURE SUPPORT)
PREFILLED_SYRINGE | INTRAVENOUS | Status: DC | PRN
  Administered 2017-11-22 (×3): 80 ug via INTRAVENOUS

## 2017-11-22 MED ORDER — SODIUM CHLORIDE 0.9 % IV SOLN: INTRAVENOUS | Status: DC

## 2017-11-22 MED ORDER — METOPROLOL TARTRATE 25 MG PO TABS: 25.0000 mg | ORAL_TABLET | Freq: Two times a day (BID) | ORAL | 1 refills | Status: AC

## 2017-11-22 MED ORDER — PROPOFOL 500 MG/50ML IV EMUL
INTRAVENOUS | Status: DC | PRN
  Administered 2017-11-22: 100 ug/kg/min via INTRAVENOUS

## 2017-11-22 MED ORDER — LACTATED RINGERS IV SOLN
INTRAVENOUS | Status: DC
  Administered 2017-11-22 (×2): via INTRAVENOUS

## 2017-11-22 MED ORDER — METOPROLOL TARTRATE 5 MG/5ML IV SOLN
5.0000 mg | Freq: Once | INTRAVENOUS | Status: AC
  Administered 2017-11-22: 5 mg via INTRAVENOUS

## 2017-11-22 MED ORDER — METOPROLOL TARTRATE 5 MG/5ML IV SOLN
INTRAVENOUS | Status: AC
  Filled 2017-11-22: qty 5

## 2017-11-22 MED ORDER — DILTIAZEM HCL 100 MG IV SOLR
25.0000 mg | Freq: Once | INTRAVENOUS | Status: AC
  Administered 2017-11-22: 25 mg via INTRAVENOUS
  Filled 2017-11-22: qty 25

## 2017-11-22 NOTE — Progress Notes (Signed)
Pt admitted to endo.  Placed on monitor.  HR 165 aflutter for TEE.  Called for 12 lead and contacted Dr. Einar Gip.  Proceed with admission and Dr. Einar Gip to come and evaluate patient.

## 2017-11-22 NOTE — Discharge Instructions (Signed)

## 2017-11-22 NOTE — CV Procedure (Addendum)
TEE: Under deep sedation (anesthesia dept help appreciated) , TEE was performed without complications: Premature termination of the procedure due to mild strider without desaturation.   Cardiac structures not well visualized due to reverbation artifact from MV and AV mechanical prosthesis. There is subvalvular mobile mass consistency of a healed vegetation and highly mobile. There is highly mobile thrombus attached to subvalvular apparatus  Vs vegetation. Most probably thrombus in view of patient's medical history.   LV:  Global hypokinesis, not well visualized, EF appears to be 30-35%. RV: Mildly dilated, normal function LA: Dilated. Left atrial appendage: Normal function. Smoke noted (moderate to severe) without thrombus.  Inter atrial septum is intact grossly intact. RA: Normal  MV: Mechanical. Normal gradient. Trace perivavlular lead. Trace MR (physiologic). TV: Normal Trace TR.  AV: Mechanical Prosthetic valve  Normal by 2 D. Doppler not done due to premature termination. (Normal by TTE) PV: Grossly Normal.   Thoracic and ascending aorta: Not visualized.  Rec: New cardiomyopathy, although LV again not well visualized and premature termination due to strider. Patient will be discharged home on BB and CCB combination and will add Dig for rate control. I will see him in the office in a week. Cardioversion not done due to severe smoke in the LA and possible thrombus on MV subvalvular apparatus.

## 2017-11-22 NOTE — Anesthesia Preprocedure Evaluation (Addendum)
Anesthesia Evaluation  Patient identified by MRN, date of birth, ID band  Reviewed: NPO status   Airway Mallampati: II  TM Distance: >3 FB Neck ROM: Full    Dental  (+) Poor Dentition, Missing, Dental Advisory Given   Pulmonary shortness of breath, Current Smoker,    breath sounds clear to auscultation       Cardiovascular hypertension, + Peripheral Vascular Disease  + dysrhythmias + Valvular Problems/Murmurs  Rhythm:Irregular Rate:Normal  Mechanical  Valves    Neuro/Psych TIACVA    GI/Hepatic metastaic pncreatic ca   Endo/Other    Renal/GU      Musculoskeletal   Abdominal   Peds  Hematology  (+) anemia ,   Anesthesia Other Findings   Reproductive/Obstetrics                           Anesthesia Physical Anesthesia Plan  ASA: IV  Anesthesia Plan: MAC   Post-op Pain Management:    Induction: Intravenous  PONV Risk Score and Plan: 3 and Treatment may vary due to age or medical condition  Airway Management Planned: Natural Airway and Nasal Cannula  Additional Equipment:   Intra-op Plan:   Post-operative Plan: Extubation in OR and Possible Post-op intubation/ventilation  Informed Consent: I have reviewed the patients History and Physical, chart, labs and discussed the procedure including the risks, benefits and alternatives for the proposed anesthesia with the patient or authorized representative who has indicated his/her understanding and acceptance.     Plan Discussed with:   Anesthesia Plan Comments:         Anesthesia Quick Evaluation

## 2017-11-22 NOTE — Transfer of Care (Signed)
Immediate Anesthesia Transfer of Care Note  Patient: Jeff [name]  Procedure(s) Performed: TRANSESOPHAGEAL ECHOCARDIOGRAM (TEE) (N/A ) CARDIOVERSION (N/A )  Patient Location: Endoscopy Unit  Anesthesia Type:MAC  Level of Consciousness: sedated  Airway & Oxygen Therapy: Patient Spontanous Breathing and Patient connected to nasal cannula oxygen  Post-op Assessment: Report given to RN and Post -op Vital signs reviewed and stable  Post vital signs: Reviewed and stable  Last Vitals:  Vitals Value Taken Time  BP    Temp    Pulse    Resp    SpO2      Last Pain:  Vitals:   11/22/17 1127  TempSrc:   PainSc: 7          Complications: No apparent anesthesia complications

## 2017-11-22 NOTE — Interval H&P Note (Signed)
History and Physical Interval Note:  11/22/2017 12:06 PM  Jeff [name]  has presented today for surgery, with the diagnosis of stroke  The various methods of treatment have been discussed with the patient and family. After consideration of risks, benefits and other options for treatment, the patient has consented to  Procedure(s): TRANSESOPHAGEAL ECHOCARDIOGRAM (TEE) (N/A) CARDIOVERSION (N/A) as a surgical intervention .  The patient's history has been reviewed, patient examined, no change in status, stable for surgery.  I have reviewed the patient's chart and labs.  Questions were answered to the patient's satisfaction.     Adrian Prows

## 2017-11-22 NOTE — H&P (Signed)
Jeff [name] is an 62 y.o. male.   Chief Complaint: generalized weakness and abdominal pain HPI patient with known mechanical aortic and mitral valve, history of stroke in 2018, who presented to the hospital with abdominal discomfort about 2 weeks ago and was eventually diagnosed with pancreatic cancer with metastasis to the liver: Chemotherapy has been planned.  Patient had developed hypercoagulable state due to pancreatic cancer, including DVT left leg, transthoracic echocardiogram revealing what appears to be thrombus versus endocarditis on the prosthetic valve and subvalvular apparatus.  He is now scheduled for TEE to further evaluate and characterize the masses, however this morning when he presented to the hospital, he was in atrial flutter with rapid ventricular response.  He denies any chest pain, palpitations.  He does have chronic dyspnea which he states has been stable, has mild abdominal discomfort and generally feels very weak with loss of appetite.  His 2 daughters are present at the bedside.  Past Medical History:  Diagnosis Date  . Acute ischemic stroke ([place])   . H/O mitral valve replacement with mechanical valve   . Heart disease   . High cholesterol   . History of CVA (cerebrovascular accident)    02/2011  . Hx of aortic valve replacement, mechanical   . HX: long term anticoagulant use   . Stroke [facility])     Past Surgical History:  Procedure Laterality Date  . CARDIAC VALVE REPLACEMENT    . IR FLUORO GUIDE PORT INSERTION RIGHT  11/13/2017  . IR US GUIDE VASC ACCESS RIGHT  11/13/2017    Family History  Problem Relation Age of Onset  . Diabetes Mother        Died before her 25 birthday  . Heart attack Brother    Social History:  reports that he has been smoking cigarettes.  He has been smoking about 0.05 packs per day. He has never used smokeless tobacco. He reports that he drinks alcohol. He reports that he has current or past drug history. Drug: Marijuana. Frequency: 7.00 times  per week.  Allergies: No Known Allergies  Medications Prior to Admission  Medication Sig Dispense Refill  . amLODipine (NORVASC) 5 MG tablet Take 1 tablet (5 mg total) by mouth daily with supper.  0  . aspirin EC 81 MG EC tablet Take 1 tablet (81 mg total) by mouth daily.    Marland Kitchen enoxaparin (LOVENOX) 80 MG/0.8ML injection Inject 0.75 mLs (75 mg total) into the skin every 12 (twelve) hours. 60 Syringe 2  . lipase/protease/amylase (CREON) 36000 UNITS CPEP capsule Take 1 capsule (36,000 Units total) by mouth 3 (three) times daily before meals. 270 capsule 1  . lisinopril (PRINIVIL,ZESTRIL) 20 MG tablet Take 1 tablet (20 mg total) by mouth daily with supper.  0  . oxyCODONE (ROXICODONE) 5 MG immediate release tablet Take 1 tablet (5 mg total) by mouth every 8 (eight) hours as needed for up to 20 doses. 20 tablet 0  . pravastatin (PRAVACHOL) 20 MG tablet Take 20 mg by mouth daily with supper.   0  . tadalafil (CIALIS) 5 MG tablet Take 5 mg by mouth daily as needed for erectile dysfunction.   0    No results found for this or any previous visit (from the past 48 hour(s)). No results found.  Review of Systems  Constitutional: Positive for malaise/fatigue and weight loss. Negative for chills, diaphoresis and fever.  HENT: Negative for ear pain, hearing loss, nosebleeds and tinnitus.   Eyes: Negative for blurred vision, double  vision and photophobia.  Respiratory: Positive for cough and shortness of breath. Negative for hemoptysis, sputum production and wheezing.   Cardiovascular: Negative for chest pain, palpitations, orthopnea, claudication, leg swelling and PND.  Gastrointestinal: Positive for nausea. Negative for abdominal pain, blood in stool, constipation, diarrhea, heartburn, melena and vomiting.  Genitourinary: Negative.   Musculoskeletal: Negative.   Skin: Negative.   Neurological: Negative.   Endo/Heme/Allergies: Negative.   Psychiatric/Behavioral: Positive for depression. Negative for  hallucinations, substance abuse and suicidal ideas.    Blood pressure 110/85, pulse (!) 165, temperature 98.6 F (37 C), temperature source Oral, resp. rate (!) 22, SpO2 99 %. Physical Exam  Constitutional: He is oriented to person, place, and time.  He is well-developed, appears well-nourished, in no acute distress.  HENT:  Head: Atraumatic.  Eyes: Scleral icterus is present.  Neck: No JVD present. No tracheal deviation present. No thyromegaly present.  Cardiovascular:  Tachycardia present.  Crisp mechanical S1 and S2 present.  No murmur appreciated.  No carotid bruit, femoral pulses normal, pedal pulses are absent bilaterally.  Musculoskeletal: Normal range of motion.  Lymphadenopathy:    He has no cervical adenopathy.  Neurological: He is alert and oriented to person, place, and time.  Skin: Skin is warm and dry.  Psychiatric: He has a normal mood and affect.    EKG 11/22/2017: Atrial flutter with 2-1 conduction without evidence of ischemia.  Assessment/Plan 1.  New onset atrial flutter with 2: 1 conduction with rapid ventricular response. CHA2DS2-VASCScore: Risk Score  3,  Yearly risk of stroke  3.2. Recommendation: ASA NO/Anticoagulation Yes  2.  History of mechanical mitral and aortic valve 3.  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  Pancreatic cancer with metastasis to the liver  Recommendation: Patient will proceed with TEE today and possibly perform cardioversion at the same time if the TEE is negative for any thrombus.  I have extensively discussed with the patient and his 2 daughters at the bedside regarding the procedure including TEE and also cardioversion.  He has been on aspirin and Lovenox for the past 2 weeks.  Previously was on Coumadin.  Adrian Prows, MD 11/22/2017, 10:50 AM

## 2017-11-22 NOTE — Progress Notes (Signed)
  Echocardiogram Echocardiogram Transesophageal has been performed.  Randa Lynn Cambree Hendrix 11/22/2017, 12:56 PM

## 2017-11-22 NOTE — Anesthesia Postprocedure Evaluation (Signed)
Anesthesia Post Note  Patient: Nicco Reaume  Procedure(s) Performed: TRANSESOPHAGEAL ECHOCARDIOGRAM (TEE) (N/A ) CARDIOVERSION (N/A )     Patient location during evaluation: Endoscopy Anesthesia Type: MAC Level of consciousness: awake and alert Pain management: pain level controlled Vital Signs Assessment: post-procedure vital signs reviewed and stable Respiratory status: spontaneous breathing, nonlabored ventilation, respiratory function stable and patient connected to nasal cannula oxygen Cardiovascular status: stable and blood pressure returned to baseline Postop Assessment: no apparent nausea or vomiting Anesthetic complications: no    Last Vitals:  Vitals:   11/22/17 1250 11/22/17 1300  BP: 112/75 126/72  Pulse: 82 100  Resp: (!) 28 (!) 23  Temp:    SpO2: 96% 97%    Last Pain:  Vitals:   11/22/17 1231  TempSrc: Oral  PainSc: 0-No pain                 Rinnah Peppel,JAMES TERRILL

## 2017-11-23 ENCOUNTER — Encounter (HOSPITAL_COMMUNITY): Payer: Self-pay | Admitting: Cardiology

## 2017-11-29 ENCOUNTER — Encounter (HOSPITAL_COMMUNITY): Payer: Self-pay | Admitting: Hematology & Oncology

## 2017-11-29 ENCOUNTER — Other Ambulatory Visit: Payer: Self-pay | Admitting: *Deleted

## 2017-11-29 DIAGNOSIS — C259 Malignant neoplasm of pancreas, unspecified: Secondary | ICD-10-CM

## 2017-11-30 ENCOUNTER — Inpatient Hospital Stay: Payer: Medicare Other

## 2017-11-30 ENCOUNTER — Encounter: Payer: Self-pay | Admitting: Hematology & Oncology

## 2017-11-30 ENCOUNTER — Other Ambulatory Visit: Payer: Self-pay

## 2017-11-30 ENCOUNTER — Inpatient Hospital Stay: Payer: Medicare Other | Attending: Hematology & Oncology | Admitting: Hematology & Oncology

## 2017-11-30 VITALS — BP 133/91 | HR 91 | Temp 99.0°F | Resp 18 | Wt 160.0 lb

## 2017-11-30 DIAGNOSIS — C787 Secondary malignant neoplasm of liver and intrahepatic bile duct: Secondary | ICD-10-CM | POA: Insufficient documentation

## 2017-11-30 DIAGNOSIS — C251 Malignant neoplasm of body of pancreas: Secondary | ICD-10-CM | POA: Diagnosis not present

## 2017-11-30 DIAGNOSIS — Z5111 Encounter for antineoplastic chemotherapy: Secondary | ICD-10-CM | POA: Insufficient documentation

## 2017-11-30 DIAGNOSIS — Z86718 Personal history of other venous thrombosis and embolism: Secondary | ICD-10-CM | POA: Insufficient documentation

## 2017-11-30 DIAGNOSIS — Z86711 Personal history of pulmonary embolism: Secondary | ICD-10-CM | POA: Diagnosis not present

## 2017-11-30 DIAGNOSIS — Z79899 Other long term (current) drug therapy: Secondary | ICD-10-CM | POA: Insufficient documentation

## 2017-11-30 DIAGNOSIS — R978 Other abnormal tumor markers: Secondary | ICD-10-CM | POA: Insufficient documentation

## 2017-11-30 DIAGNOSIS — G893 Neoplasm related pain (acute) (chronic): Secondary | ICD-10-CM | POA: Insufficient documentation

## 2017-11-30 DIAGNOSIS — Z7901 Long term (current) use of anticoagulants: Secondary | ICD-10-CM | POA: Diagnosis not present

## 2017-11-30 DIAGNOSIS — Z7189 Other specified counseling: Secondary | ICD-10-CM | POA: Insufficient documentation

## 2017-11-30 DIAGNOSIS — D500 Iron deficiency anemia secondary to blood loss (chronic): Secondary | ICD-10-CM

## 2017-11-30 DIAGNOSIS — C259 Malignant neoplasm of pancreas, unspecified: Secondary | ICD-10-CM

## 2017-11-30 HISTORY — DX: Other specified counseling: Z71.89

## 2017-11-30 LAB — CBC WITH DIFFERENTIAL (CANCER CENTER ONLY)
BASOS ABS: 0 10*3/uL (ref 0.0–0.1)
BASOS PCT: 0 %
EOS ABS: 0.3 10*3/uL (ref 0.0–0.5)
Eosinophils Relative: 2 %
HEMATOCRIT: 32.5 % — AB (ref 38.7–49.9)
HEMOGLOBIN: 10.3 g/dL — AB (ref 13.0–17.1)
Lymphocytes Relative: 11 %
Lymphs Abs: 1.2 10*3/uL (ref 0.9–3.3)
MCH: 30.5 pg (ref 28.0–33.4)
MCHC: 31.7 g/dL — AB (ref 32.0–35.9)
MCV: 96.2 fL (ref 82.0–98.0)
MONOS PCT: 13 %
Monocytes Absolute: 1.5 10*3/uL — ABNORMAL HIGH (ref 0.1–0.9)
NEUTROS ABS: 8.3 10*3/uL — AB (ref 1.5–6.5)
NEUTROS PCT: 74 %
Platelet Count: 431 10*3/uL — ABNORMAL HIGH (ref 145–400)
RBC: 3.38 MIL/uL — AB (ref 4.20–5.70)
RDW: 15.4 % (ref 11.1–15.7)
WBC: 11.4 10*3/uL — AB (ref 4.0–10.0)

## 2017-11-30 LAB — CMP (CANCER CENTER ONLY)
ALBUMIN: 3.5 g/dL (ref 3.5–5.0)
ALK PHOS: 646 U/L — AB (ref 26–84)
ALT: 61 U/L — AB (ref 10–47)
AST: 68 U/L — AB (ref 11–38)
Anion gap: 8 (ref 5–15)
BILIRUBIN TOTAL: 1.4 mg/dL (ref 0.2–1.6)
BUN: 19 mg/dL (ref 7–22)
CALCIUM: 9.8 mg/dL (ref 8.0–10.3)
CO2: 28 mmol/L (ref 18–33)
CREATININE: 1 mg/dL (ref 0.60–1.20)
Chloride: 104 mmol/L (ref 98–108)
Glucose, Bld: 109 mg/dL (ref 73–118)
Potassium: 4.4 mmol/L (ref 3.3–4.7)
Sodium: 140 mmol/L (ref 128–145)
TOTAL PROTEIN: 7.3 g/dL (ref 6.4–8.1)

## 2017-11-30 LAB — LACTATE DEHYDROGENASE: LDH: 456 U/L — ABNORMAL HIGH (ref 125–245)

## 2017-11-30 MED ORDER — OXYCODONE HCL ER 15 MG PO T12A: 15.0000 mg | EXTENDED_RELEASE_TABLET | Freq: Two times a day (BID) | ORAL | 0 refills | Status: DC

## 2017-11-30 MED ORDER — LIDOCAINE-PRILOCAINE 2.5-2.5 % EX CREA: 1.0000 "application " | TOPICAL_CREAM | CUTANEOUS | 6 refills | Status: DC | PRN

## 2017-11-30 MED ORDER — OXYCODONE HCL 10 MG PO TABS: 10.0000 mg | ORAL_TABLET | Freq: Four times a day (QID) | ORAL | 0 refills | Status: AC | PRN

## 2017-11-30 MED ORDER — DRONABINOL 5 MG PO CAPS: 5.0000 mg | ORAL_CAPSULE | Freq: Two times a day (BID) | ORAL | 0 refills | Status: DC

## 2017-11-30 MED FILL — LIDOCAINE-PRILOCAINE CREAM: 2.5-2.5 | 30 days supply | Qty: 30 | Fill #0

## 2017-11-30 MED FILL — oxyCODONE HCL 10 MG TABS: 10 | 23 days supply | Qty: 90 | Fill #0

## 2017-11-30 NOTE — Progress Notes (Signed)
START ON PATHWAY REGIMEN - Pancreatic     A cycle is every 28 days:     Nab-paclitaxel (protein bound)      Gemcitabine   **Always confirm dose/schedule in your pharmacy ordering system**  Patient Characteristics: Adenocarcinoma, Metastatic Disease, First Line, PS = 0, 1 Histology: Adenocarcinoma Current evidence of distant metastases<= Yes AJCC T Category: TX AJCC N Category: NX AJCC M Category: M1 AJCC 8 Stage Grouping: IV Line of Therapy: First Line  Intent of Therapy: Non-Curative / Palliative Intent, Discussed with Patient 

## 2017-11-30 NOTE — Progress Notes (Signed)
Hematology and Oncology Follow Up Visit  Jeff [name] [id number] 1955-11-01 61 y.o. 11/30/2017   Principle Diagnosis:   Metastatic adenocarcinoma of the pancreas-liver metastasis  Hypercoagulable state with pulmonary embolism/lower extremity DVT  Current Therapy:    Gemzar/Abraxane - cycle #1 to start on 12/05/2017  Lovenox 80 mg sq BID     Interim History:  Jeff [name] is back for his first office visit.  I saw him in the hospital when he was there.  At that time, he was just found to have metastatic adenocarcinoma of the pancreas.  His staging studies were done with CT scans.  He had a large pancreatic mass measuring 4.7 x 6.3 cm.  He had multiple liver metastasis.  He may have had some enlarged porta hepatus lymph nodes.  A CT of the chest showed extensive pulmonary emboli.  He had nonspecific pulmonary nodules.  His CA 19-9 was 322,500.  His CEA was 219.  He had a biopsy of 1 of the liver lesions.  This was done on April 1.  The pathology report ([id number]) showed adenocarcinoma consistent with pancreatic cancer.  I did do molecular analysis.  Unfortunately, his tumor was negative for PD-L1.  It had a low TMB.  It was HER-2 negative.  It was positive for K-ras mutation.  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  he had a Port-A-Cath placed while in the hospital.  He recently underwent a TEE.  He was found to have some thrombotic disease in his heart.  As such, he will be on lifelong Lovenox.  He comes in with 1 of his children.  He has quite a few children.  He actually looks pretty good.  He is doing the Lovenox at home.  He is having quite a bit of abdominal pain.  I am not surprised by this.  At some point we might want to consider a celiac block.  I will go ahead and start him on OxyContin at 15 mg p.o. twice daily and also give him oxycodone (10 mg p.o. every 6 hours as needed) for breakthrough pain.  Hopefully, somewhat Marinol (5 mg p.o. twice daily) will help with his  appetite.  We are in a tough situation.  His performance status is ECOG 1-2.  He is not a candidate for any aggressive therapy.  I do not think he would be able to tolerate.FOLFIRINOX   For right now, he is not having any emesis.  He is not having any diarrhea.  There is no bleeding.  There is no increased cough or shortness of breath.  He has had some slight leg swelling.  He has had some iron in the past while he was in the hospital.  Medications:  Current Outpatient Medications:  .  aspirin EC 81 MG EC tablet, Take 1 tablet (81 mg total) by mouth daily., Disp: , Rfl:  .  diltiazem (CARDIZEM CD) 180 MG 24 hr capsule, Take 1 capsule (180 mg total) by mouth daily., Disp: 30 capsule, Rfl: 1 .  dronabinol (MARINOL) 5 MG capsule, Take 1 capsule (5 mg total) by mouth 2 (two) times daily before lunch and supper., Disp: 60 capsule, Rfl: 0 .  enoxaparin (LOVENOX) 80 MG/0.8ML injection, Inject 0.75 mLs (75 mg total) into the skin every 12 (twelve) hours., Disp: 60 Syringe, Rfl: 2 .  lidocaine-prilocaine (EMLA) cream, Apply 1 application topically as needed., Disp: 30 g, Rfl: 6 .  lipase/protease/amylase (CREON) 36000 UNITS CPEP capsule, Take 1 capsule (36,000 Units total) by mouth  3 (three) times daily before meals., Disp: 270 capsule, Rfl: 1 .  metoprolol tartrate (LOPRESSOR) 25 MG tablet, Take 1 tablet (25 mg total) by mouth 2 (two) times daily., Disp: 60 tablet, Rfl: 1 .  oxyCODONE (OXYCONTIN) 15 mg 12 hr tablet, Take 1 tablet (15 mg total) by mouth every 12 (twelve) hours., Disp: 60 tablet, Rfl: 0 .  oxyCODONE (ROXICODONE) 5 MG immediate release tablet, Take 1 tablet (5 mg total) by mouth every 8 (eight) hours as needed for up to 30 doses., Disp: 30 tablet, Rfl: 0 .  oxyCODONE 10 MG TABS, Take 1 tablet (10 mg total) by mouth every 6 (six) hours as needed for severe pain., Disp: 90 tablet, Rfl: 0  Allergies: No Known Allergies  Past Medical History, Surgical history, Social history, and Family  History were reviewed and updated.  Review of Systems: Review of Systems  Constitutional: Positive for appetite change and fatigue.  HENT:  Negative.   Eyes: Negative.   Respiratory: Positive for shortness of breath.   Cardiovascular: Positive for leg swelling.  Gastrointestinal: Positive for abdominal pain, constipation and nausea.  Endocrine: Negative.   Genitourinary: Negative.    Musculoskeletal: Positive for back pain and myalgias.  Skin: Negative.   Neurological: Negative.   Hematological: Negative.   Psychiatric/Behavioral: Negative.     Physical Exam:  weight is 160 lb (72.6 kg). His oral temperature is 99 F (37.2 C). His blood pressure is 133/91 (abnormal) and his pulse is 91. His respiration is 18 and oxygen saturation is 100%.   Wt Readings from Last 3 Encounters:  11/30/17 160 lb (72.6 kg)  11/08/17 160 lb 0.9 oz (72.6 kg)  09/27/17 163 lb 12.8 oz (74.3 kg)    Physical Exam  Constitutional: He is oriented to person, place, and time.  HENT:  Head: Normocephalic and atraumatic.  Mouth/Throat: Oropharynx is clear and moist.  Eyes: Pupils are equal, round, and reactive to light. EOM are normal.  Neck: Normal range of motion.  Cardiovascular: Normal rate, regular rhythm and normal heart sounds.  Pulmonary/Chest: Effort normal and breath sounds normal.  Abdominal: Soft. Bowel sounds are normal.  Musculoskeletal: Normal range of motion. He exhibits no edema, tenderness or deformity.  Lymphadenopathy:    He has no cervical adenopathy.  Neurological: He is alert and oriented to person, place, and time.  Skin: Skin is warm and dry. No rash noted. No erythema.  Psychiatric: He has a normal mood and affect. His behavior is normal. Judgment and thought content normal.  Vitals reviewed.    Lab Results  Component Value Date   WBC 11.4 (H) 11/30/2017   HGB 9.3 (L) 11/16/2017   HCT 32.5 (L) 11/30/2017   MCV 96.2 11/30/2017   PLT 431 (H) 11/30/2017     Chemistry        Component Value Date/Time   NA 140 11/30/2017 1332   K 4.4 11/30/2017 1332   CL 104 11/30/2017 1332   CO2 28 11/30/2017 1332   BUN 19 11/30/2017 1332   CREATININE 1.00 11/30/2017 1332      Component Value Date/Time   CALCIUM 9.8 11/30/2017 1332   ALKPHOS 646 (H) 11/30/2017 1332   AST 68 (H) 11/30/2017 1332   ALT 61 (H) 11/30/2017 1332   BILITOT 1.4 11/30/2017 1332       Impression and Plan: Jeff [name] is a 62 year old African-American male with metastatic pancreatic cancer.  He has extensive hepatic metastasis.  He has an incredibly elevated CA 19-9.  I talked to he and his daughter about chemotherapy.  They understand that chemotherapy is not curative.  Our goal of chemotherapy is to try to help slow his cancer growth down.  I did not tell him any type of prognostic information.  I do not tell him how long I thought he had to live.  I think the CA 9-9 will definitely tell us how well he is doing.  We will try the Gemzar/Abraxane combination.  I think this is reasonable.  I do not think he would be able to tolerate 3 weeks on and one-week off.  I would do 2 weeks on and one-week off.  We will try to get started next week.  I spent about an hour with he and his daughter.  It was nice to see him again.  He was really nice.  It was fun talking to him.  He is doing his best.  Hopefully, getting his pain under better control will help with his appetite and help with some weight gain.  We will see him back for week 2 of cycle 1 of therapy.   Volanda Napoleon, MD 4/19/20194:34 PM

## 2017-12-03 LAB — IRON AND TIBC
IRON: 32 ug/dL — AB (ref 42–163)
Saturation Ratios: 15 % — ABNORMAL LOW (ref 42–163)
TIBC: 214 ug/dL (ref 202–409)
UIBC: 182 ug/dL

## 2017-12-03 LAB — CANCER ANTIGEN 19-9: CA 19-9: 765900 U/mL — ABNORMAL HIGH (ref 0–35)

## 2017-12-03 LAB — FERRITIN: FERRITIN: 1940 ng/mL — AB (ref 22–316)

## 2017-12-04 MED FILL — DRONABINOL 5 MG CAP: 5 | 30 days supply | Qty: 60 | Fill #0

## 2017-12-05 ENCOUNTER — Inpatient Hospital Stay: Payer: Medicare Other

## 2017-12-05 ENCOUNTER — Other Ambulatory Visit: Payer: Self-pay

## 2017-12-05 VITALS — BP 124/65 | HR 82 | Temp 98.7°F | Resp 20

## 2017-12-05 DIAGNOSIS — C251 Malignant neoplasm of body of pancreas: Secondary | ICD-10-CM | POA: Diagnosis not present

## 2017-12-05 DIAGNOSIS — D500 Iron deficiency anemia secondary to blood loss (chronic): Secondary | ICD-10-CM

## 2017-12-05 HISTORY — DX: Iron deficiency anemia secondary to blood loss (chronic): D50.0

## 2017-12-05 LAB — CMP (CANCER CENTER ONLY)
ALT: 61 U/L — AB (ref 10–47)
AST: 84 U/L — AB (ref 11–38)
Albumin: 3.4 g/dL — ABNORMAL LOW (ref 3.5–5.0)
Alkaline Phosphatase: 566 U/L — ABNORMAL HIGH (ref 26–84)
Anion gap: 10 (ref 5–15)
BUN: 22 mg/dL (ref 7–22)
CALCIUM: 9.9 mg/dL (ref 8.0–10.3)
CO2: 28 mmol/L (ref 18–33)
Chloride: 103 mmol/L (ref 98–108)
Creatinine: 1.3 mg/dL — ABNORMAL HIGH (ref 0.60–1.20)
GLUCOSE: 163 mg/dL — AB (ref 73–118)
Potassium: 4.2 mmol/L (ref 3.3–4.7)
SODIUM: 141 mmol/L (ref 128–145)
TOTAL PROTEIN: 7.2 g/dL (ref 6.4–8.1)
Total Bilirubin: 2.7 mg/dL — ABNORMAL HIGH (ref 0.2–1.6)

## 2017-12-05 LAB — CBC WITH DIFFERENTIAL (CANCER CENTER ONLY)
BASOS ABS: 0 10*3/uL (ref 0.0–0.1)
BASOS PCT: 0 %
EOS ABS: 0.1 10*3/uL (ref 0.0–0.5)
Eosinophils Relative: 1 %
HCT: 33.6 % — ABNORMAL LOW (ref 38.7–49.9)
Hemoglobin: 10.8 g/dL — ABNORMAL LOW (ref 13.0–17.1)
Lymphocytes Relative: 6 %
Lymphs Abs: 1 10*3/uL (ref 0.9–3.3)
MCH: 30.6 pg (ref 28.0–33.4)
MCHC: 32.1 g/dL (ref 32.0–35.9)
MCV: 95.2 fL (ref 82.0–98.0)
MONO ABS: 2.4 10*3/uL — AB (ref 0.1–0.9)
Monocytes Relative: 15 %
Neutro Abs: 12.4 10*3/uL — ABNORMAL HIGH (ref 1.5–6.5)
Neutrophils Relative %: 78 %
Platelet Count: 429 10*3/uL — ABNORMAL HIGH (ref 145–400)
RBC: 3.53 MIL/uL — AB (ref 4.20–5.70)
RDW: 15.3 % (ref 11.1–15.7)
WBC: 15.9 10*3/uL — AB (ref 4.0–10.0)

## 2017-12-05 MED ORDER — PROCHLORPERAZINE MALEATE 10 MG PO TABS
10.0000 mg | ORAL_TABLET | Freq: Once | ORAL | Status: AC
  Administered 2017-12-05: 10 mg via ORAL

## 2017-12-05 MED ORDER — ONDANSETRON HCL 8 MG PO TABS: 8.0000 mg | ORAL_TABLET | Freq: Two times a day (BID) | ORAL | 1 refills | Status: AC

## 2017-12-05 MED ORDER — SODIUM CHLORIDE 0.9 % IV SOLN
800.0000 mg/m2 | Freq: Once | INTRAVENOUS | Status: AC
  Administered 2017-12-05: 1520 mg via INTRAVENOUS
  Filled 2017-12-05: qty 24.2

## 2017-12-05 MED ORDER — PROCHLORPERAZINE MALEATE 10 MG PO TABS
ORAL_TABLET | ORAL | Status: AC
  Filled 2017-12-05: qty 1

## 2017-12-05 MED ORDER — PROCHLORPERAZINE MALEATE 10 MG PO TABS: 10.0000 mg | ORAL_TABLET | Freq: Four times a day (QID) | ORAL | 1 refills | Status: DC | PRN

## 2017-12-05 MED ORDER — SODIUM CHLORIDE 0.9 % IV SOLN
510.0000 mg | Freq: Once | INTRAVENOUS | Status: AC
  Administered 2017-12-05: 510 mg via INTRAVENOUS
  Filled 2017-12-05: qty 17

## 2017-12-05 MED ORDER — SODIUM CHLORIDE 0.9% FLUSH
10.0000 mL | INTRAVENOUS | Status: DC | PRN
  Administered 2017-12-05: 10 mL
  Filled 2017-12-05: qty 10

## 2017-12-05 MED ORDER — PACLITAXEL PROTEIN-BOUND CHEMO INJECTION 100 MG
100.0000 mg/m2 | Freq: Once | INTRAVENOUS | Status: AC
  Administered 2017-12-05: 200 mg via INTRAVENOUS
  Filled 2017-12-05: qty 40

## 2017-12-05 MED ORDER — SODIUM CHLORIDE 0.9 % IV SOLN
Freq: Once | INTRAVENOUS | Status: AC
  Administered 2017-12-05: 12:00:00 via INTRAVENOUS

## 2017-12-05 MED ORDER — PROCHLORPERAZINE MALEATE 10 MG PO TABS: 10.0000 mg | ORAL_TABLET | Freq: Four times a day (QID) | ORAL | 1 refills | Status: AC | PRN

## 2017-12-05 MED ORDER — ONDANSETRON HCL 8 MG PO TABS: 8.0000 mg | ORAL_TABLET | Freq: Two times a day (BID) | ORAL | 1 refills | Status: DC | PRN

## 2017-12-05 MED ORDER — SODIUM CHLORIDE 0.9 % IV SOLN: 750.0000 mg | Freq: Once | INTRAVENOUS | Status: DC

## 2017-12-05 MED ORDER — LIDOCAINE-PRILOCAINE 2.5-2.5 % EX CREA: TOPICAL_CREAM | CUTANEOUS | 3 refills | Status: AC

## 2017-12-05 MED ORDER — HEPARIN SOD (PORK) LOCK FLUSH 100 UNIT/ML IV SOLN
500.0000 [IU] | Freq: Once | INTRAVENOUS | Status: AC | PRN
  Administered 2017-12-05: 500 [IU]
  Filled 2017-12-05: qty 5

## 2017-12-05 MED ORDER — LORAZEPAM 0.5 MG PO TABS: 0.5000 mg | ORAL_TABLET | Freq: Four times a day (QID) | ORAL | 0 refills | Status: AC | PRN

## 2017-12-05 MED FILL — ONDANSETRON HCL 8 MG TABLET: 8 | 15 days supply | Qty: 30 | Fill #0

## 2017-12-05 MED FILL — LORazepam 0.5 MG TABS: 0.5 | 8 days supply | Qty: 30 | Fill #0

## 2017-12-05 MED FILL — PROCHLORPERAZINE 10 MG TAB: 10 | 7 days supply | Qty: 30 | Fill #0

## 2017-12-05 NOTE — Patient Instructions (Signed)
[facility] Discharge Instructions for Patients Receiving Chemotherapy  Today you received the following chemotherapy agents:  Gemzar and Abraxane  To help prevent nausea and vomiting after your treatment, we encourage you to take your nausea medication as ordered per MD.    If you develop nausea and vomiting that is not controlled by your nausea medication, call the clinic.   BELOW ARE SYMPTOMS THAT SHOULD BE REPORTED IMMEDIATELY:  *FEVER GREATER THAN 100.5 F  *CHILLS WITH OR WITHOUT FEVER  NAUSEA AND VOMITING THAT IS NOT CONTROLLED WITH YOUR NAUSEA MEDICATION  *UNUSUAL SHORTNESS OF BREATH  *UNUSUAL BRUISING OR BLEEDING  TENDERNESS IN MOUTH AND THROAT WITH OR WITHOUT PRESENCE OF ULCERS  *URINARY PROBLEMS  *BOWEL PROBLEMS  UNUSUAL RASH Items with * indicate a potential emergency and should be followed up as soon as possible.  Feel free to call the clinic should you have any questions or concerns. The clinic phone number is [phone].  Please show the [facility] [facility] at check-in to the Emergency Department and triage nurse.  Ferumoxytol injection What is this medicine? FERUMOXYTOL is an iron complex. Iron is used to make healthy red blood cells, which carry oxygen and nutrients throughout the body. This medicine is used to treat iron deficiency anemia in people with chronic kidney disease. This medicine may be used for other purposes; ask your health care provider or pharmacist if you have questions. COMMON BRAND NAME(S): Feraheme What should I tell my health care provider before I take this medicine? They need to know if you have any of these conditions: -anemia not caused by low iron levels -high levels of iron in the blood -magnetic resonance imaging (MRI) test scheduled -an unusual or allergic reaction to iron, other medicines, foods, dyes, or preservatives -pregnant or trying to get pregnant -breast-feeding How should I use this medicine? This  medicine is for injection into a vein. It is given by a health care professional in a hospital or clinic setting. Talk to your pediatrician regarding the use of this medicine in children. Special care may be needed. Overdosage: If you think you have taken too much of this medicine contact a poison control center or emergency room at once. NOTE: This medicine is only for you. Do not share this medicine with others. What if I miss a dose? It is important not to miss your dose. Call your doctor or health care professional if you are unable to keep an appointment. What may interact with this medicine? This medicine may interact with the following medications: -other iron products This list may not describe all possible interactions. Give your health care provider a list of all the medicines, herbs, non-prescription drugs, or dietary supplements you use. Also tell them if you smoke, drink alcohol, or use illegal drugs. Some items may interact with your medicine. What should I watch for while using this medicine? Visit your doctor or healthcare professional regularly. Tell your doctor or healthcare professional if your symptoms do not start to get better or if they get worse. You may need blood work done while you are taking this medicine. You may need to follow a special diet. Talk to your doctor. Foods that contain iron include: whole grains/cereals, dried fruits, beans, or peas, leafy green vegetables, and organ meats (liver, kidney). What side effects may I notice from receiving this medicine? Side effects that you should report to your doctor or health care professional as soon as possible: -allergic reactions like skin rash, itching  or hives, swelling of the face, lips, or tongue -breathing problems -changes in blood pressure -feeling faint or lightheaded, falls -fever or chills -flushing, sweating, or hot feelings -swelling of the ankles or feet Side effects that usually do not require medical  attention (report to your doctor or health care professional if they continue or are bothersome): -diarrhea -headache -nausea, vomiting -stomach pain This list may not describe all possible side effects. Call your doctor for medical advice about side effects. You may report side effects to FDA at 1-800-FDA-1088. Where should I keep my medicine? This drug is given in a hospital or clinic and will not be stored at home. NOTE: This sheet is a summary. It may not cover all possible information. If you have questions about this medicine, talk to your doctor, pharmacist, or health care provider.  2018 Elsevier/Gold Standard (2015-09-02 12:41:49)

## 2017-12-08 ENCOUNTER — Emergency Department (HOSPITAL_COMMUNITY): Payer: Medicare Other

## 2017-12-08 ENCOUNTER — Other Ambulatory Visit: Payer: Self-pay

## 2017-12-08 ENCOUNTER — Inpatient Hospital Stay (HOSPITAL_COMMUNITY)
Admission: EM | Admit: 2017-12-08 | Discharge: 2017-12-13 | DRG: 175 | Disposition: A | Discharge status: Home Health | Payer: Medicare Other | Attending: Internal Medicine | Admitting: Internal Medicine

## 2017-12-08 ENCOUNTER — Encounter (HOSPITAL_COMMUNITY): Payer: Self-pay | Admitting: Emergency Medicine

## 2017-12-08 DIAGNOSIS — Z8673 Personal history of transient ischemic attack (TIA), and cerebral infarction without residual deficits: Secondary | ICD-10-CM

## 2017-12-08 DIAGNOSIS — I2699 Other pulmonary embolism without acute cor pulmonale: Principal | ICD-10-CM | POA: Diagnosis present

## 2017-12-08 DIAGNOSIS — I5022 Chronic systolic (congestive) heart failure: Secondary | ICD-10-CM | POA: Diagnosis present

## 2017-12-08 DIAGNOSIS — Z7901 Long term (current) use of anticoagulants: Secondary | ICD-10-CM

## 2017-12-08 DIAGNOSIS — I4891 Unspecified atrial fibrillation: Secondary | ICD-10-CM | POA: Diagnosis present

## 2017-12-08 DIAGNOSIS — I110 Hypertensive heart disease with heart failure: Secondary | ICD-10-CM | POA: Diagnosis present

## 2017-12-08 DIAGNOSIS — Z952 Presence of prosthetic heart valve: Secondary | ICD-10-CM

## 2017-12-08 DIAGNOSIS — Z86718 Personal history of other venous thrombosis and embolism: Secondary | ICD-10-CM

## 2017-12-08 DIAGNOSIS — K7200 Acute and subacute hepatic failure without coma: Secondary | ICD-10-CM | POA: Diagnosis present

## 2017-12-08 DIAGNOSIS — R188 Other ascites: Secondary | ICD-10-CM | POA: Diagnosis present

## 2017-12-08 DIAGNOSIS — R404 Transient alteration of awareness: Secondary | ICD-10-CM

## 2017-12-08 DIAGNOSIS — G9340 Encephalopathy, unspecified: Secondary | ICD-10-CM | POA: Diagnosis present

## 2017-12-08 DIAGNOSIS — R4182 Altered mental status, unspecified: Secondary | ICD-10-CM | POA: Diagnosis not present

## 2017-12-08 DIAGNOSIS — R6510 Systemic inflammatory response syndrome (SIRS) of non-infectious origin without acute organ dysfunction: Secondary | ICD-10-CM | POA: Diagnosis present

## 2017-12-08 DIAGNOSIS — I82402 Acute embolism and thrombosis of unspecified deep veins of left lower extremity: Secondary | ICD-10-CM | POA: Diagnosis present

## 2017-12-08 DIAGNOSIS — D696 Thrombocytopenia, unspecified: Secondary | ICD-10-CM | POA: Diagnosis present

## 2017-12-08 DIAGNOSIS — Z8507 Personal history of malignant neoplasm of pancreas: Secondary | ICD-10-CM

## 2017-12-08 DIAGNOSIS — I959 Hypotension, unspecified: Secondary | ICD-10-CM | POA: Diagnosis present

## 2017-12-08 DIAGNOSIS — D630 Anemia in neoplastic disease: Secondary | ICD-10-CM | POA: Diagnosis present

## 2017-12-08 DIAGNOSIS — Z7982 Long term (current) use of aspirin: Secondary | ICD-10-CM

## 2017-12-08 DIAGNOSIS — M4854XA Collapsed vertebra, not elsewhere classified, thoracic region, initial encounter for fracture: Secondary | ICD-10-CM | POA: Diagnosis present

## 2017-12-08 DIAGNOSIS — I712 Thoracic aortic aneurysm, without rupture: Secondary | ICD-10-CM | POA: Diagnosis present

## 2017-12-08 DIAGNOSIS — R410 Disorientation, unspecified: Secondary | ICD-10-CM

## 2017-12-08 DIAGNOSIS — A419 Sepsis, unspecified organism: Secondary | ICD-10-CM

## 2017-12-08 DIAGNOSIS — C787 Secondary malignant neoplasm of liver and intrahepatic bile duct: Secondary | ICD-10-CM | POA: Diagnosis present

## 2017-12-08 DIAGNOSIS — C259 Malignant neoplasm of pancreas, unspecified: Secondary | ICD-10-CM | POA: Diagnosis present

## 2017-12-08 DIAGNOSIS — D6859 Other primary thrombophilia: Secondary | ICD-10-CM | POA: Diagnosis present

## 2017-12-08 DIAGNOSIS — C252 Malignant neoplasm of tail of pancreas: Secondary | ICD-10-CM

## 2017-12-08 DIAGNOSIS — F0390 Unspecified dementia without behavioral disturbance: Secondary | ICD-10-CM | POA: Diagnosis present

## 2017-12-08 DIAGNOSIS — Z66 Do not resuscitate: Secondary | ICD-10-CM | POA: Diagnosis present

## 2017-12-08 DIAGNOSIS — I39 Endocarditis and heart valve disorders in diseases classified elsewhere: Secondary | ICD-10-CM | POA: Diagnosis present

## 2017-12-08 DIAGNOSIS — I348 Other nonrheumatic mitral valve disorders: Secondary | ICD-10-CM | POA: Diagnosis present

## 2017-12-08 DIAGNOSIS — E872 Acidosis: Secondary | ICD-10-CM | POA: Diagnosis present

## 2017-12-08 DIAGNOSIS — F502 Bulimia nervosa: Secondary | ICD-10-CM

## 2017-12-08 DIAGNOSIS — I330 Acute and subacute infective endocarditis: Secondary | ICD-10-CM | POA: Diagnosis present

## 2017-12-08 DIAGNOSIS — E7800 Pure hypercholesterolemia, unspecified: Secondary | ICD-10-CM | POA: Diagnosis present

## 2017-12-08 DIAGNOSIS — I484 Atypical atrial flutter: Secondary | ICD-10-CM | POA: Diagnosis present

## 2017-12-08 DIAGNOSIS — F17210 Nicotine dependence, cigarettes, uncomplicated: Secondary | ICD-10-CM | POA: Diagnosis present

## 2017-12-08 HISTORY — DX: Malignant (primary) neoplasm, unspecified: C80.1

## 2017-12-08 LAB — URINALYSIS, ROUTINE W REFLEX MICROSCOPIC
GLUCOSE, UA: NEGATIVE mg/dL
KETONES UR: NEGATIVE mg/dL
Leukocytes, UA: NEGATIVE
Nitrite: NEGATIVE
PH: 5 (ref 5.0–8.0)
Protein, ur: 100 mg/dL — AB
SPECIFIC GRAVITY, URINE: 1.024 (ref 1.005–1.030)

## 2017-12-08 LAB — COMPREHENSIVE METABOLIC PANEL
ALBUMIN: 2.6 g/dL — AB (ref 3.5–5.0)
ALK PHOS: 352 U/L — AB (ref 38–126)
ALT: 131 U/L — AB (ref 17–63)
AST: 204 U/L — ABNORMAL HIGH (ref 15–41)
Anion gap: 13 (ref 5–15)
BILIRUBIN TOTAL: 5.4 mg/dL — AB (ref 0.3–1.2)
BUN: 28 mg/dL — ABNORMAL HIGH (ref 6–20)
CALCIUM: 8.8 mg/dL — AB (ref 8.9–10.3)
CO2: 23 mmol/L (ref 22–32)
Chloride: 100 mmol/L — ABNORMAL LOW (ref 101–111)
Creatinine, Ser: 1.03 mg/dL (ref 0.61–1.24)
GFR calc Af Amer: >60 mL/min (ref >60)
GFR calc non Af Amer: >60 mL/min (ref >60)
GLUCOSE: 173 mg/dL — AB (ref 65–99)
Potassium: 4.4 mmol/L (ref 3.5–5.1)
Sodium: 136 mmol/L (ref 135–145)
TOTAL PROTEIN: 6.4 g/dL — AB (ref 6.5–8.1)

## 2017-12-08 LAB — CBC
HCT: 30.3 % — ABNORMAL LOW (ref 39.0–52.0)
Hemoglobin: 9.8 g/dL — ABNORMAL LOW (ref 13.0–17.0)
MCH: 29.4 pg (ref 26.0–34.0)
MCHC: 32.3 g/dL (ref 30.0–36.0)
MCV: 91 fL (ref 78.0–100.0)
Platelets: 230 10*3/uL (ref 150–400)
RBC: 3.33 MIL/uL — ABNORMAL LOW (ref 4.22–5.81)
RDW: 15.6 % — AB (ref 11.5–15.5)
WBC: 16.7 10*3/uL — ABNORMAL HIGH (ref 4.0–10.5)

## 2017-12-08 LAB — I-STAT TROPONIN, ED: Troponin i, poc: 0.03 ng/mL (ref 0.00–0.08)

## 2017-12-08 LAB — RAPID URINE DRUG SCREEN, HOSP PERFORMED
AMPHETAMINES: NOT DETECTED
BARBITURATES: NOT DETECTED
BENZODIAZEPINES: NOT DETECTED
Cocaine: NOT DETECTED
Opiates: POSITIVE — AB
TETRAHYDROCANNABINOL: POSITIVE — AB

## 2017-12-08 LAB — I-STAT CG4 LACTIC ACID, ED
LACTIC ACID, VENOUS: 3.99 mmol/L — AB (ref 0.5–1.9)
Lactic Acid, Venous: 3.64 mmol/L (ref 0.5–1.9)
Lactic Acid, Venous: 3.84 mmol/L (ref 0.5–1.9)

## 2017-12-08 LAB — CBG MONITORING, ED: GLUCOSE-CAPILLARY: 158 mg/dL — AB (ref 65–99)

## 2017-12-08 LAB — LIPASE, BLOOD: Lipase: 19 U/L (ref 11–51)

## 2017-12-08 MED ORDER — PIPERACILLIN-TAZOBACTAM 3.375 G IVPB
3.3750 g | Freq: Three times a day (TID) | INTRAVENOUS | Status: DC
  Administered 2017-12-09 – 2017-12-11 (×7): 3.375 g via INTRAVENOUS
  Filled 2017-12-08 (×7): qty 50

## 2017-12-08 MED ORDER — VANCOMYCIN HCL IN DEXTROSE 1-5 GM/200ML-% IV SOLN
1000.0000 mg | Freq: Two times a day (BID) | INTRAVENOUS | Status: DC
  Administered 2017-12-09 – 2017-12-11 (×5): 1000 mg via INTRAVENOUS
  Filled 2017-12-08 (×5): qty 200

## 2017-12-08 MED ORDER — PIPERACILLIN-TAZOBACTAM 3.375 G IVPB 30 MIN
3.3750 g | Freq: Once | INTRAVENOUS | Status: AC
  Administered 2017-12-08: 3.375 g via INTRAVENOUS
  Filled 2017-12-08: qty 50

## 2017-12-08 MED ORDER — VANCOMYCIN HCL IN DEXTROSE 1-5 GM/200ML-% IV SOLN
1000.0000 mg | Freq: Once | INTRAVENOUS | Status: AC
  Administered 2017-12-08: 1000 mg via INTRAVENOUS
  Filled 2017-12-08: qty 200

## 2017-12-08 NOTE — Progress Notes (Signed)
Pharmacy Antibiotic Note  Jeff [name] is a 63 y.o. male admitted on 12/08/2017 with sepsis.  Pharmacy has been consulted for vancomycin and Zosyn dosing.  WBC and lactic acid elevated, afebrile. SCr 1.03. Vancomycin 1g and Zosyn 3.375g IV each x1 ordered by EDP.  Plan: Vancomycin 1g IV q12h Zosyn 3.375g IV q8h EI Follow c/s, clinical progression, renal function, level PRN, de-escalation/LOT     Temp (24hrs), Avg:99.9 F (37.7 C), Min:99.9 F (37.7 C), Max:99.9 F (37.7 C)  Recent Labs  Lab 12/05/17 1102 12/08/17 2009 12/08/17 2037  WBC 15.9* 16.7*  --   CREATININE 1.30* 1.03  --   LATICACIDVEN  --   --  3.99*    Estimated Creatinine Clearance: 75.4 mL/min (by C-G formula based on SCr of 1.03 mg/dL).    No Known Allergies  Antimicrobials this admission: Vancomycin 4/27 >>  Zosyn 4/27 >>   Dose adjustments this admission: n/a  Microbiology results: 4/27 BCx:   Thank you for allowing pharmacy to be a part of this patient's care. Kahil Agner D. Soriya Worster, PharmD, BCPS Clinical Pharmacist  [phone] 12/08/2017 9:38 PM

## 2017-12-08 NOTE — ED Notes (Addendum)
Pt given 2L fluid boluses per EDP verbal order.

## 2017-12-08 NOTE — ED Provider Notes (Addendum)
[facility] EMERGENCY DEPARTMENT Provider Note   CSN: [id number] Arrival date & time: 12/08/17  1945     History   Chief Complaint Chief Complaint  Patient presents with  . Altered Mental Status    HPI Jeff [name] is a 62 y.o. 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 History is obtained mostly from daughter at bedside who lives with patient. Patient had first chemotherapy treatment 4/24, since he has been more confused, gradually worsening. Other associates symptoms include decreased appetite, mild cough, vomiting, diarrhea. Daughter states patient has been "hallucinating" and seeing things that are not there. No fevers. Patient endorses central chest pain.  Onset: gradual.  Alleviating factors: none. Aggravating factors: none.   HPI  Past Medical History:  Diagnosis Date  . Acute ischemic stroke ([place])   . Goals of care, counseling/discussion 11/30/2017  . H/O mitral valve replacement with mechanical valve   . Heart disease   . High cholesterol   . History of CVA (cerebrovascular accident)    02/2011  . Hx of aortic valve replacement, mechanical   . HX: long term anticoagulant use   . Iron deficiency anemia due to chronic blood loss 12/05/2017  . Stroke [facility])     Patient Active Problem List   Diagnosis Date Noted  . Sepsis ([place]) 12/09/2017  . Atrial fibrillation with RVR ([place]) 12/09/2017  . Acute encephalopathy 12/09/2017  . History of DVT (deep vein thrombosis) 12/09/2017  . Mitral valve vegetation 12/09/2017  . Iron deficiency anemia due to chronic blood loss 12/05/2017  . Goals of care, counseling/discussion 11/30/2017  . Pancreas cancer ([place])   . Malnutrition of moderate degree 11/09/2017  . Blood in stool   . Abdominal  pain 11/08/2017  . Pancreatic mass 11/08/2017  . Rectal bleeding 11/08/2017  . Acute deep vein thrombosis (DVT) of left lower extremity ([place]) 11/08/2017  . Acute blood loss anemia 11/08/2017  . Stroke ([place]) 07/31/2017  . Chronic anticoagulation   . Hyperlipidemia 02/28/2017  . Hypertension 02/28/2017  . Smoker 02/28/2017  . TIA (transient ischemic attack)   . Slurred speech   . Acute ischemic stroke ([place]) 03/19/2015  . Supratherapeutic INR 09/07/2014  . CVA (cerebral infarction) 07/16/2013  . Cerebellar stroke, acute ([place]) 07/13/2013  . Ataxia 07/12/2013  . Vomiting 07/10/2013  . Nausea with vomiting 07/10/2013  . Subtherapeutic international normalized ratio (INR) 07/10/2013  . AKI (acute kidney injury) ([place]) 07/10/2013  . Volume depletion 07/10/2013  . Nausea & vomiting 07/10/2013  . Cellulitis of left upper arm and forearm 04/04/2012  . History of stroke   . Hx of aortic valve replacement, mechanical   . H/O mitral valve replacement with mechanical valve   . HX: long term anticoagulant use     Past Surgical History:  Procedure Laterality Date  . CARDIAC VALVE REPLACEMENT    . IR FLUORO GUIDE PORT INSERTION RIGHT  11/13/2017  . IR US GUIDE VASC ACCESS RIGHT  11/13/2017  . TEE WITHOUT CARDIOVERSION N/A 11/22/2017   Procedure: TRANSESOPHAGEAL ECHOCARDIOGRAM (TEE);  Surgeon: Adrian Prows, MD;  Location: [facility] ENDOSCOPY;  Service: Cardiovascular;  Laterality: N/A;        Home Medications    Prior to Admission medications   Medication Sig Start Date End Date Taking? Authorizing Provider  aspirin EC 81 MG EC tablet  Take 1 tablet (81 mg total) by mouth daily. 07/31/17  Yes Cherene Altes, MD  diltiazem (CARDIZEM CD) 180 MG 24 hr capsule Take 1 capsule (180 mg total) by mouth daily. 11/22/17 11/22/18 Yes Adrian Prows, MD  dronabinol (MARINOL) 5 MG capsule Take 1 capsule (5 mg total) by mouth 2 (two) times daily before lunch and supper. 11/30/17  Yes Volanda Napoleon, MD  enoxaparin  (LOVENOX) 80 MG/0.8ML injection Inject 0.75 mLs (75 mg total) into the skin every 12 (twelve) hours. 11/16/17  Yes Caren Griffins, MD  lidocaine-prilocaine (EMLA) cream Apply 1 application topically as needed. Patient taking differently: Apply 1 application topically See admin instructions. Pt takes Before chemotherapy - daily x14 days, then off x14 days 11/30/17  Yes Ennever, Rudell Cobb, MD  lidocaine-prilocaine (EMLA) cream Apply to affected area once 12/05/17  Yes Ennever, Rudell Cobb, MD  lipase/protease/amylase (CREON) 36000 UNITS CPEP capsule Take 1 capsule (36,000 Units total) by mouth 3 (three) times daily before meals. 11/16/17  Yes Caren Griffins, MD  metoprolol tartrate (LOPRESSOR) 25 MG tablet Take 1 tablet (25 mg total) by mouth 2 (two) times daily. 11/22/17 11/22/18 Yes Adrian Prows, MD  ondansetron (ZOFRAN) 8 MG tablet Take 1 tablet (8 mg total) by mouth 2 (two) times daily. Patient taking differently: Take 8 mg by mouth every 8 (eight) hours as needed.  12/05/17  Yes Volanda Napoleon, MD  oxyCODONE 10 MG TABS Take 1 tablet (10 mg total) by mouth every 6 (six) hours as needed for severe pain. 11/30/17  Yes Ennever, Rudell Cobb, MD  prochlorperazine (COMPAZINE) 10 MG tablet Take 1 tablet (10 mg total) by mouth every 6 (six) hours as needed for nausea or vomiting. 12/05/17  Yes Ennever, Rudell Cobb, MD  LORazepam (ATIVAN) 0.5 MG tablet Take 1 tablet (0.5 mg total) by mouth every 6 (six) hours as needed (Nausea or vomiting). 12/05/17   Volanda Napoleon, MD  oxyCODONE (OXYCONTIN) 15 mg 12 hr tablet Take 1 tablet (15 mg total) by mouth every 12 (twelve) hours. Patient not taking: Reported on 12/09/2017 11/30/17   Volanda Napoleon, MD  oxyCODONE (ROXICODONE) 5 MG immediate release tablet Take 1 tablet (5 mg total) by mouth every 8 (eight) hours as needed for up to 30 doses. Patient not taking: Reported on 12/09/2017 11/22/17   Adrian Prows, MD    Family History Family History  Problem Relation Age of Onset  .  Diabetes Mother        Died before her 72 birthday  . Heart attack Brother     Social History Social History   Tobacco Use  . Smoking status: Current Every Day Smoker    Packs/day: 0.05    Types: Cigarettes  . Smokeless tobacco: Never Used  . Tobacco comment: smoke two per day  Substance Use Topics  . Alcohol use: Yes    Comment: wine coolers prn  . Drug use: Yes    Frequency: 7.0 times per week    Types: Marijuana    Comment: smoke when he can get it     Allergies   Patient has no known allergies.   Review of Systems Review of Systems  Constitutional: Positive for appetite change.  Respiratory: Positive for cough.   Cardiovascular: Positive for chest pain.  Allergic/Immunologic: Positive for immunocompromised state.  Psychiatric/Behavioral: Positive for confusion.     Physical Exam Updated Vital Signs BP 108/85   Pulse 76   Temp 99.9 F (37.7 C) (  Oral)   Resp (!) 25   SpO2 100%   Physical Exam  Constitutional: He is oriented to person, place, and time. He appears well-developed and well-nourished. No distress.  Asleep but easily arousable with verbal stimuli.  HENT:  Head: Normocephalic and atraumatic.  Nose: Nose normal.  Mouth/Throat: No oropharyngeal exudate.  Dry mucous membranes.  Eyes: Pupils are equal, round, and reactive to light. Conjunctivae and EOM are normal. Scleral icterus is present.  Neck: Normal range of motion.  Cardiovascular: Normal heart sounds and intact distal pulses. An irregularly irregular rhythm present. Tachycardia present.  No murmur heard. Heart rate 150 to 160s. 2+ DP and radial pulses bilaterally. No LE edema.   Pulmonary/Chest: Effort normal and breath sounds normal. Tachypnea noted.  Speaking in full sentences. No wheezing or crackles.  Abdominal: Soft. Bowel sounds are normal. There is tenderness.  Epigastric abdominal tenderness with guarding. No rebound or rigidity. No suprapubic or CVA tenderness.     Musculoskeletal: Normal range of motion. He exhibits no deformity.  Neurological: He is alert and oriented to person, place, and time.  Cranial nerves grossly intact. 5/5 strength with handgrip and ankle flexion/extension.  Skin: Skin is warm and dry. Capillary refill takes less than 2 seconds.  Psychiatric: He has a normal mood and affect. His behavior is normal. Judgment and thought content normal.  Nursing note and vitals reviewed.    ED Treatments / Results  Labs (all labs ordered are listed, but only abnormal results are displayed) Labs Reviewed  COMPREHENSIVE METABOLIC PANEL - Abnormal; Notable for the following components:      Result Value   Chloride 100 (*)    Glucose, Bld 173 (*)    BUN 28 (*)    Calcium 8.8 (*)    Total Protein 6.4 (*)    Albumin 2.6 (*)    AST 204 (*)    ALT 131 (*)    Alkaline Phosphatase 352 (*)    Total Bilirubin 5.4 (*)    All other components within normal limits  CBC - Abnormal; Notable for the following components:   WBC 16.7 (*)    RBC 3.33 (*)    Hemoglobin 9.8 (*)    HCT 30.3 (*)    RDW 15.6 (*)    All other components within normal limits  URINALYSIS, ROUTINE W REFLEX MICROSCOPIC - Abnormal; Notable for the following components:   Color, Urine AMBER (*)    APPearance HAZY (*)    Hgb urine dipstick SMALL (*)    Bilirubin Urine MODERATE (*)    Protein, ur 100 (*)    Bacteria, UA RARE (*)    All other components within normal limits  RAPID URINE DRUG SCREEN, HOSP PERFORMED - Abnormal; Notable for the following components:   Opiates POSITIVE (*)    Tetrahydrocannabinol POSITIVE (*)    All other components within normal limits  ACETAMINOPHEN LEVEL - Abnormal; Notable for the following components:   Acetaminophen (Tylenol), Serum <10 (*)    All other components within normal limits  CBC - Abnormal; Notable for the following components:   WBC 11.5 (*)    RBC 2.86 (*)    Hemoglobin 8.5 (*)    HCT 26.0 (*)    RDW 15.8 (*)    All  other components within normal limits  BASIC METABOLIC PANEL - Abnormal; Notable for the following components:   Glucose, Bld 159 (*)    BUN 29 (*)    Calcium 8.2 (*)  All other components within normal limits  LACTIC ACID, PLASMA - Abnormal; Notable for the following components:   Lactic Acid, Venous 2.2 (*)    All other components within normal limits  CBG MONITORING, ED - Abnormal; Notable for the following components:   Glucose-Capillary 158 (*)    All other components within normal limits  I-STAT CG4 LACTIC ACID, ED - Abnormal; Notable for the following components:   Lactic Acid, Venous 3.99 (*)    All other components within normal limits  I-STAT CG4 LACTIC ACID, ED - Abnormal; Notable for the following components:   Lactic Acid, Venous 3.64 (*)    All other components within normal limits  I-STAT CG4 LACTIC ACID, ED - Abnormal; Notable for the following components:   Lactic Acid, Venous 3.84 (*)    All other components within normal limits  I-STAT ARTERIAL BLOOD GAS, ED - Abnormal; Notable for the following components:   pO2, Arterial 71.0 (*)    All other components within normal limits  CULTURE, BLOOD (ROUTINE X 2)  CULTURE, BLOOD (ROUTINE X 2)  LIPASE, BLOOD  ETHANOL  SALICYLATE LEVEL  AMMONIA  LACTIC ACID, PLASMA  PROCALCITONIN  MAGNESIUM  PHOSPHORUS  LACTIC ACID, PLASMA  URINALYSIS, ROUTINE W REFLEX MICROSCOPIC  BLOOD GAS, ARTERIAL  HEPARIN LEVEL (UNFRACTIONATED)  LACTIC ACID, PLASMA  I-STAT TROPONIN, ED  I-STAT CG4 LACTIC ACID, ED  TYPE AND SCREEN  ABO/RH    EKG None  EKG: reviewed by me, findings of atrial fibrillation with RVR ventricular rate 158  Radiology Dg Chest Portable 1 View  Result Date: 12/08/2017 CLINICAL DATA:  Altered mental status since yesterday. Patient had first chemotherapy treatment on Wednesday for pancreatic cancer with metastasis to the liver. EXAM: PORTABLE CHEST 1 VIEW COMPARISON:  11/07/2017 FINDINGS: Slightly low lung  volumes. Bibasilar atelectasis is noted. There is no pleural effusion. No pulmonary consolidation or pulmonary edema. No aggressive osseous lesions. Port catheter is noted tip at the cavoatrial junction. Median sternotomy sutures are in place. Heart is top-normal in size. There is mild aortic atherosclerosis with uncoiling of the thoracic aorta. No acute osseous abnormality. IMPRESSION: Slightly low lung volumes since prior with bibasilar atelectasis. No active pulmonary disease. Aortic atherosclerosis. Electronically Signed   By: Ashley Royalty M.D.   On: 12/08/2017 22:10   US Abdomen Limited Ruq  Result Date: 12/09/2017 CLINICAL DATA:  Sepsis.  Pancreatic carcinoma. EXAM: ULTRASOUND ABDOMEN LIMITED RIGHT UPPER QUADRANT COMPARISON:  CT on 11/07/2017 FINDINGS: Gallbladder: A small amount of echogenic sludge is seen in the gallbladder, however no definite gallstones are identified. Diffuse gallbladder wall thickening is seen measuring up to 14 mm which is nonspecific. No sonographic Murphy sign noted by sonographer. Common bile duct: Diameter: 5 mm, within normal limits. Liver: Numerous hypoechoic masses are seen throughout the liver, as seen on recent CT, consistent with diffuse liver metastases. Portal vein is patent on color Doppler imaging with normal direction of blood flow towards the liver. IMPRESSION: Diffuse liver metastases. Gallbladder sludge and diffuse gallbladder wall thickening, without definite gallstones or sonographic Murphy's sign. This is nonspecific and may be due to hepatic disease, however acalculus cholecystitis cannot definitely be excluded. Electronically Signed   By: Earle Gell M.D.   On: 12/09/2017 08:56    Procedures .Critical Care Performed by: Kinnie Feil, PA-C Authorized by: Kinnie Feil, PA-C   Critical care provider statement:    Critical care time (minutes):  35   Critical care was necessary to treat or prevent  imminent or life-threatening deterioration of  the following conditions:  Sepsis   Critical care was time spent personally by me on the following activities:  Examination of patient, obtaining history from patient or surrogate, review of old charts, re-evaluation of patient's condition, ordering and review of laboratory studies, ordering and review of radiographic studies and ordering and performing treatments and interventions   I assumed direction of critical care for this patient from another provider in my specialty: no     (including critical care time)   Medications Ordered in ED Medications  vancomycin (VANCOCIN) IVPB 1000 mg/200 mL premix (1,000 mg Intravenous New Bag/Given 12/09/17 0859)  piperacillin-tazobactam (ZOSYN) IVPB 3.375 g (3.375 g Intravenous New Bag/Given 12/09/17 0520)  LORazepam (ATIVAN) tablet 0.5 mg (has no administration in time range)  sodium chloride flush (NS) 0.9 % injection 3 mL (3 mLs Intravenous Not Given 12/09/17 0313)  ondansetron (ZOFRAN) tablet 4 mg ( Oral See Alternative 12/09/17 1017)    Or  ondansetron (ZOFRAN) injection 4 mg (4 mg Intravenous Given 12/09/17 1017)  acetaminophen (TYLENOL) tablet 650 mg (has no administration in time range)    Or  acetaminophen (TYLENOL) suppository 650 mg (has no administration in time range)  ipratropium-albuterol (DUONEB) 0.5-2.5 (3) MG/3ML nebulizer solution 3 mL (has no administration in time range)  amiodarone (NEXTERONE PREMIX) 360-4.14 MG/200ML-% (1.8 mg/mL) IV infusion (0 mg/hr Intravenous Stopped 12/09/17 0744)  amiodarone (NEXTERONE PREMIX) 360-4.14 MG/200ML-% (1.8 mg/mL) IV infusion (30 mg/hr Intravenous New Bag/Given 12/09/17 0738)  iopamidol (ISOVUE-300) 61 % injection (has no administration in time range)  0.9 %  sodium chloride infusion ( Intravenous New Bag/Given 12/09/17 0646)  heparin ADULT infusion 100 units/mL (25000 units/215mL sodium chloride 0.45%) (1,100 Units/hr Intravenous New Bag/Given 12/09/17 0851)  piperacillin-tazobactam (ZOSYN) IVPB 3.375 g  (0 g Intravenous Stopped 12/08/17 2224)  vancomycin (VANCOCIN) IVPB 1000 mg/200 mL premix (0 mg Intravenous Stopped 12/08/17 2254)  diltiazem (CARDIZEM) 1 mg/mL load via infusion 10 mg (10 mg Intravenous Bolus from Bag 12/09/17 0119)  digoxin (LANOXIN) 0.25 MG/ML injection 0.25 mg (0.25 mg Intravenous Given 12/09/17 0449)  amiodarone (NEXTERONE) 1.8 mg/mL load via infusion 150 mg (150 mg Intravenous Bolus from Bag 12/09/17 0534)  lactated ringers bolus 500 mL (0 mLs Intravenous Stopped 12/09/17 0859)     Initial Impression / Assessment and Plan / ED Course  I have reviewed the triage vital signs and the nursing notes.  Pertinent labs & imaging results that were available during my care of the patient were reviewed by me and considered in my medical decision making (see chart for details).  Clinical Course as of Dec 09 1053  Sat Dec 08, 2017  2129 WBC(!): 16.7 [CG]  2129 Creatinine: 1.03 [CG]  2129 AST(!): 204 [CG]  2129 ALT(!): 131 [CG]  2129 Alkaline Phosphatase(!): 352 [CG]  2129 Lactic Acid, Venous(!!): 3.99 [CG]    Clinical Course User Index [CG] Kinnie Feil, PA-C   62 year old with recently diagnosed pancreatic cancer with metastases to liver here for altered mental status, cough, vomiting, diarrhea, anorexia since first chemotherapy on 4/24. He reports central, constant chest pain.  On exam, he has epigastric tenderness. Atrial fibrillation with RVR heart rate in the 150 to 160s range with soft blood pressures. Scleral icterus noted. Ddx includes occult infection, encephalopathy, elevated ammonia. Daughter has been with patient the last 3 days and she denies head trauma.    Lab work remarkable for elevated lactic acid, leukocytosis, elevated LFTs. Given immunocompromised state, tachypnea, elevated lactic  acid, white count sepsis protocol initiated. Will hold off rate control at this time especially in setting of soft blood pressures and sepsis picture. 2 L IV fluids, antibiotics  ordered. Urinalysis, lipase, ammonia, EtOH/salicylate/acetaminophen levels, urine drug screen are pending. Patient will be handed off to the oncoming ED PA who will reevaluate, follow up on labs and admit. May need rate control down the line. Patient discussed with Dr. Rogene Houston. Final Clinical Impressions(s) / ED Diagnoses   Final diagnoses:  Atrial fibrillation with RVR ([place])  Transient alteration of awareness  Confusion  Sepsis, due to unspecified organism [facility])    ED Discharge Orders    None       Kinnie Feil, PA-C 12/08/17 2316    Kinnie Feil, PA-C 12/09/17 1057    Fredia Sorrow, MD 12/17/17 0025

## 2017-12-08 NOTE — ED Notes (Signed)
Please note: in addition to UA/RUDS collected, a urine culture was also sent down to Main Lab with instructions to hold.

## 2017-12-08 NOTE — ED Notes (Signed)
Check pts [id number]

## 2017-12-08 NOTE — ED Notes (Signed)
Pt informed he needs to provide a urine sample. Pt verbalized understanding, but does not need to go at this time.

## 2017-12-08 NOTE — ED Triage Notes (Signed)
Patient presents to ED for assessment of altered mental status since yesterday.  Patient had first chemo treatment Wednesday for pancreatic cancer with metastesis to his liver.  Patient seeing people who aren't there outside his window and speaking to them, confused about date and year (knows age and president).  Patient follows commands appropriately at triage

## 2017-12-08 NOTE — ED Notes (Signed)
One set of Blood Cultures collected and sent down to Main Lab.

## 2017-12-08 NOTE — ED Provider Notes (Signed)
Complicated medical history incl a-fib, panc CA with mets to liver On chemo (24th) Since has had more confusion, cough, vomiting, diarrhea No fever Patient c/o chest pain New RVR late in presentation Code sepsis - cough, AMS, hypotension - on Vanc and zosyn, fluids running UA pending  Plan: admit when labs resulted Close re-evaluation and serial exam  11:30 - patient awake and in NAD. Heart rate continues to be 160's. BP 98/81. Cardizem considered but not favored due to blood pressure.   12:50 - Work up completed. Lactate hovering at around 3.84. Fluids have been provided (2 liters given per nurse). Agree with sepsis. Abx running.   Heart rate remains in the 160's. Blood pressure stable with systolic in upper 13'K. Discussed with Dr. Stark Jock. Will start Cardizem, 10 mg bolus, 5 mg drip. Closely monitor blood pressure.   CRITICAL CARE Performed by: Dewaine Oats   Total critical care time: 35 minutes  Critical care time was exclusive of separately billable procedures and treating other patients.  Critical care was necessary to treat or prevent imminent or life-threatening deterioration.  Critical care was time spent personally by me on the following activities: development of treatment plan with patient and/or surrogate as well as nursing, discussions with consultants, evaluation of patient's response to treatment, examination of patient, obtaining history from patient or surrogate, ordering and performing treatments and interventions, ordering and review of laboratory studies, ordering and review of radiographic studies, pulse oximetry and re-evaluation of patient's condition.  Hospitalist paged for admission.  Discussed with Dr. Fuller Plan who accepts the patient for admission.    Charlann Lange, PA-C 12/09/17 0129    Fredia Sorrow, MD 12/09/17 [phone]

## 2017-12-08 NOTE — ED Notes (Signed)
Aldona Bar daughter- [phone] Denman George daughter- [phone]

## 2017-12-09 ENCOUNTER — Inpatient Hospital Stay (HOSPITAL_COMMUNITY): Payer: Medicare Other

## 2017-12-09 DIAGNOSIS — Z66 Do not resuscitate: Secondary | ICD-10-CM | POA: Diagnosis present

## 2017-12-09 DIAGNOSIS — I330 Acute and subacute infective endocarditis: Secondary | ICD-10-CM | POA: Diagnosis present

## 2017-12-09 DIAGNOSIS — R6520 Severe sepsis without septic shock: Secondary | ICD-10-CM

## 2017-12-09 DIAGNOSIS — R410 Disorientation, unspecified: Secondary | ICD-10-CM | POA: Diagnosis not present

## 2017-12-09 DIAGNOSIS — I2699 Other pulmonary embolism without acute cor pulmonale: Secondary | ICD-10-CM | POA: Diagnosis present

## 2017-12-09 DIAGNOSIS — I39 Endocarditis and heart valve disorders in diseases classified elsewhere: Secondary | ICD-10-CM | POA: Diagnosis present

## 2017-12-09 DIAGNOSIS — C259 Malignant neoplasm of pancreas, unspecified: Secondary | ICD-10-CM | POA: Diagnosis present

## 2017-12-09 DIAGNOSIS — Z7901 Long term (current) use of anticoagulants: Secondary | ICD-10-CM | POA: Diagnosis not present

## 2017-12-09 DIAGNOSIS — A419 Sepsis, unspecified organism: Secondary | ICD-10-CM | POA: Diagnosis not present

## 2017-12-09 DIAGNOSIS — E872 Acidosis: Secondary | ICD-10-CM | POA: Diagnosis present

## 2017-12-09 DIAGNOSIS — A403 Sepsis due to Streptococcus pneumoniae: Secondary | ICD-10-CM | POA: Diagnosis not present

## 2017-12-09 DIAGNOSIS — M4854XA Collapsed vertebra, not elsewhere classified, thoracic region, initial encounter for fracture: Secondary | ICD-10-CM | POA: Diagnosis present

## 2017-12-09 DIAGNOSIS — I5022 Chronic systolic (congestive) heart failure: Secondary | ICD-10-CM | POA: Diagnosis present

## 2017-12-09 DIAGNOSIS — D630 Anemia in neoplastic disease: Secondary | ICD-10-CM | POA: Diagnosis present

## 2017-12-09 DIAGNOSIS — R6510 Systemic inflammatory response syndrome (SIRS) of non-infectious origin without acute organ dysfunction: Secondary | ICD-10-CM | POA: Diagnosis present

## 2017-12-09 DIAGNOSIS — C787 Secondary malignant neoplasm of liver and intrahepatic bile duct: Secondary | ICD-10-CM | POA: Diagnosis present

## 2017-12-09 DIAGNOSIS — R112 Nausea with vomiting, unspecified: Secondary | ICD-10-CM | POA: Diagnosis not present

## 2017-12-09 DIAGNOSIS — G9340 Encephalopathy, unspecified: Secondary | ICD-10-CM

## 2017-12-09 DIAGNOSIS — R4182 Altered mental status, unspecified: Secondary | ICD-10-CM | POA: Diagnosis present

## 2017-12-09 DIAGNOSIS — Z515 Encounter for palliative care: Secondary | ICD-10-CM | POA: Diagnosis not present

## 2017-12-09 DIAGNOSIS — I484 Atypical atrial flutter: Secondary | ICD-10-CM | POA: Diagnosis present

## 2017-12-09 DIAGNOSIS — I82403 Acute embolism and thrombosis of unspecified deep veins of lower extremity, bilateral: Secondary | ICD-10-CM | POA: Diagnosis not present

## 2017-12-09 DIAGNOSIS — I4891 Unspecified atrial fibrillation: Secondary | ICD-10-CM | POA: Diagnosis present

## 2017-12-09 DIAGNOSIS — D6859 Other primary thrombophilia: Secondary | ICD-10-CM | POA: Diagnosis present

## 2017-12-09 DIAGNOSIS — C252 Malignant neoplasm of tail of pancreas: Secondary | ICD-10-CM | POA: Diagnosis not present

## 2017-12-09 DIAGNOSIS — E860 Dehydration: Secondary | ICD-10-CM | POA: Diagnosis not present

## 2017-12-09 DIAGNOSIS — Z7189 Other specified counseling: Secondary | ICD-10-CM | POA: Diagnosis not present

## 2017-12-09 DIAGNOSIS — I82402 Acute embolism and thrombosis of unspecified deep veins of left lower extremity: Secondary | ICD-10-CM | POA: Diagnosis present

## 2017-12-09 DIAGNOSIS — I712 Thoracic aortic aneurysm, without rupture: Secondary | ICD-10-CM | POA: Diagnosis present

## 2017-12-09 DIAGNOSIS — D696 Thrombocytopenia, unspecified: Secondary | ICD-10-CM | POA: Diagnosis present

## 2017-12-09 DIAGNOSIS — R188 Other ascites: Secondary | ICD-10-CM | POA: Diagnosis present

## 2017-12-09 DIAGNOSIS — K7200 Acute and subacute hepatic failure without coma: Secondary | ICD-10-CM | POA: Diagnosis present

## 2017-12-09 DIAGNOSIS — A409 Streptococcal sepsis, unspecified: Secondary | ICD-10-CM | POA: Diagnosis not present

## 2017-12-09 DIAGNOSIS — I959 Hypotension, unspecified: Secondary | ICD-10-CM | POA: Diagnosis present

## 2017-12-09 DIAGNOSIS — I110 Hypertensive heart disease with heart failure: Secondary | ICD-10-CM | POA: Diagnosis present

## 2017-12-09 DIAGNOSIS — F0390 Unspecified dementia without behavioral disturbance: Secondary | ICD-10-CM | POA: Diagnosis present

## 2017-12-09 DIAGNOSIS — Z86718 Personal history of other venous thrombosis and embolism: Secondary | ICD-10-CM | POA: Diagnosis not present

## 2017-12-09 DIAGNOSIS — C251 Malignant neoplasm of body of pancreas: Secondary | ICD-10-CM

## 2017-12-09 DIAGNOSIS — I348 Other nonrheumatic mitral valve disorders: Secondary | ICD-10-CM | POA: Diagnosis present

## 2017-12-09 LAB — SALICYLATE LEVEL

## 2017-12-09 LAB — BASIC METABOLIC PANEL
Anion gap: 8 (ref 5–15)
BUN: 29 mg/dL — ABNORMAL HIGH (ref 6–20)
CALCIUM: 8.2 mg/dL — AB (ref 8.9–10.3)
CO2: 24 mmol/L (ref 22–32)
Chloride: 106 mmol/L (ref 101–111)
Creatinine, Ser: 1.05 mg/dL (ref 0.61–1.24)
GFR calc Af Amer: >60 mL/min (ref >60)
GFR calc non Af Amer: >60 mL/min (ref >60)
GLUCOSE: 159 mg/dL — AB (ref 65–99)
Potassium: 4.7 mmol/L (ref 3.5–5.1)
Sodium: 138 mmol/L (ref 135–145)

## 2017-12-09 LAB — CBC
HCT: 26 % — ABNORMAL LOW (ref 39.0–52.0)
HEMOGLOBIN: 8.5 g/dL — AB (ref 13.0–17.0)
MCH: 29.7 pg (ref 26.0–34.0)
MCHC: 32.7 g/dL (ref 30.0–36.0)
MCV: 90.9 fL (ref 78.0–100.0)
Platelets: 153 10*3/uL (ref 150–400)
RBC: 2.86 MIL/uL — AB (ref 4.22–5.81)
RDW: 15.8 % — ABNORMAL HIGH (ref 11.5–15.5)
WBC: 11.5 10*3/uL — ABNORMAL HIGH (ref 4.0–10.5)

## 2017-12-09 LAB — AMMONIA: Ammonia: 30 umol/L (ref 9–35)

## 2017-12-09 LAB — ACETAMINOPHEN LEVEL: Acetaminophen (Tylenol), Serum: <10 ug/mL — ABNORMAL LOW (ref 10–30)

## 2017-12-09 LAB — PROCALCITONIN: PROCALCITONIN: 6.84 ng/mL

## 2017-12-09 LAB — I-STAT ARTERIAL BLOOD GAS, ED
Bicarbonate: 24 mmol/L (ref 20.0–28.0)
O2 SAT: 95 %
PCO2 ART: 35.9 mmHg (ref 32.0–48.0)
Patient temperature: 98.6
TCO2: 25 mmol/L (ref 22–32)
pH, Arterial: 7.432 (ref 7.350–7.450)
pO2, Arterial: 71 mmHg — ABNORMAL LOW (ref 83.0–108.0)

## 2017-12-09 LAB — LACTIC ACID, PLASMA
LACTIC ACID, VENOUS: 2.2 mmol/L — AB (ref 0.5–1.9)
LACTIC ACID, VENOUS: 1.9 mmol/L (ref 0.5–1.9)
Lactic Acid, Venous: 1.8 mmol/L (ref 0.5–1.9)
Lactic Acid, Venous: 1.9 mmol/L (ref 0.5–1.9)

## 2017-12-09 LAB — ABO/RH: ABO/RH(D): B POS

## 2017-12-09 LAB — ETHANOL

## 2017-12-09 LAB — PHOSPHORUS: Phosphorus: 3 mg/dL (ref 2.5–4.6)

## 2017-12-09 LAB — MAGNESIUM: Magnesium: 2 mg/dL (ref 1.7–2.4)

## 2017-12-09 LAB — HEPARIN LEVEL (UNFRACTIONATED)

## 2017-12-09 MED ORDER — METOPROLOL TARTRATE 25 MG PO TABS
25.0000 mg | ORAL_TABLET | Freq: Two times a day (BID) | ORAL | Status: DC
  Administered 2017-12-10 – 2017-12-13 (×7): 25 mg via ORAL
  Filled 2017-12-09 (×7): qty 1

## 2017-12-09 MED ORDER — DILTIAZEM LOAD VIA INFUSION
10.0000 mg | Freq: Once | INTRAVENOUS | Status: DC
Start: 2017-12-09 — End: 2017-12-09

## 2017-12-09 MED ORDER — SODIUM CHLORIDE 0.9% FLUSH
3.0000 mL | Freq: Two times a day (BID) | INTRAVENOUS | Status: DC
  Administered 2017-12-10: 3 mL via INTRAVENOUS

## 2017-12-09 MED ORDER — LACTATED RINGERS IV BOLUS
500.0000 mL | Freq: Once | INTRAVENOUS | Status: AC
  Administered 2017-12-09: 500 mL via INTRAVENOUS

## 2017-12-09 MED ORDER — LORAZEPAM 0.5 MG PO TABS: 0.5000 mg | ORAL_TABLET | Freq: Four times a day (QID) | ORAL | Status: DC | PRN

## 2017-12-09 MED ORDER — DIGOXIN 0.25 MG/ML IJ SOLN
0.2500 mg | INTRAMUSCULAR | Status: AC
  Administered 2017-12-09: 0.25 mg via INTRAVENOUS
  Filled 2017-12-09: qty 2

## 2017-12-09 MED ORDER — SODIUM CHLORIDE 0.9% FLUSH
10.0000 mL | Freq: Two times a day (BID) | INTRAVENOUS | Status: DC
  Administered 2017-12-11 – 2017-12-13 (×2): 10 mL

## 2017-12-09 MED ORDER — LEVALBUTEROL HCL 0.63 MG/3ML IN NEBU: 0.6300 mg | INHALATION_SOLUTION | RESPIRATORY_TRACT | Status: DC | PRN

## 2017-12-09 MED ORDER — ACETAMINOPHEN 325 MG PO TABS: 650.0000 mg | ORAL_TABLET | Freq: Four times a day (QID) | ORAL | Status: DC | PRN

## 2017-12-09 MED ORDER — IOPAMIDOL (ISOVUE-300) INJECTION 61%
INTRAVENOUS | Status: AC
  Filled 2017-12-09: qty 100

## 2017-12-09 MED ORDER — MORPHINE SULFATE (PF) 4 MG/ML IV SOLN: 2.0000 mg | INTRAVENOUS | Status: DC | PRN

## 2017-12-09 MED ORDER — LACTATED RINGERS IV BOLUS (SEPSIS)
2000.0000 mL | Freq: Once | INTRAVENOUS | Status: DC
  Administered 2017-12-09: 2000 mL via INTRAVENOUS

## 2017-12-09 MED ORDER — IPRATROPIUM-ALBUTEROL 0.5-2.5 (3) MG/3ML IN SOLN: 3.0000 mL | RESPIRATORY_TRACT | Status: DC | PRN

## 2017-12-09 MED ORDER — IOPAMIDOL (ISOVUE-300) INJECTION 61%
INTRAVENOUS | Status: AC
  Filled 2017-12-09: qty 30

## 2017-12-09 MED ORDER — SODIUM CHLORIDE 0.9 % IV BOLUS
500.0000 mL | Freq: Once | INTRAVENOUS | Status: AC
  Administered 2017-12-09: 500 mL via INTRAVENOUS

## 2017-12-09 MED ORDER — PANCRELIPASE (LIP-PROT-AMYL) 12000-38000 UNITS PO CPEP
36000.0000 [IU] | ORAL_CAPSULE | Freq: Three times a day (TID) | ORAL | Status: DC
  Administered 2017-12-10 – 2017-12-13 (×10): 36000 [IU] via ORAL
  Filled 2017-12-09: qty 3
  Filled 2017-12-09: qty 1
  Filled 2017-12-09 (×9): qty 3

## 2017-12-09 MED ORDER — OXYCODONE HCL 5 MG PO TABS
5.0000 mg | ORAL_TABLET | ORAL | Status: DC | PRN
  Administered 2017-12-13: 5 mg via ORAL
  Filled 2017-12-09: qty 1

## 2017-12-09 MED ORDER — HEPARIN (PORCINE) IN NACL 100-0.45 UNIT/ML-% IJ SOLN
1900.0000 [IU]/h | INTRAMUSCULAR | Status: DC
  Administered 2017-12-09: 1100 [IU]/h via INTRAVENOUS
  Administered 2017-12-10 (×2): 1500 [IU]/h via INTRAVENOUS
  Filled 2017-12-09 (×5): qty 250

## 2017-12-09 MED ORDER — AMIODARONE LOAD VIA INFUSION
150.0000 mg | Freq: Once | INTRAVENOUS | Status: AC
  Administered 2017-12-09: 150 mg via INTRAVENOUS
  Filled 2017-12-09: qty 83.34

## 2017-12-09 MED ORDER — DILTIAZEM LOAD VIA INFUSION
10.0000 mg | Freq: Once | INTRAVENOUS | Status: AC
  Administered 2017-12-09: 10 mg via INTRAVENOUS
  Filled 2017-12-09: qty 10

## 2017-12-09 MED ORDER — ACETAMINOPHEN 650 MG RE SUPP: 650.0000 mg | Freq: Four times a day (QID) | RECTAL | Status: DC | PRN

## 2017-12-09 MED ORDER — SODIUM CHLORIDE 0.9 % IV BOLUS: 500.0000 mL | Freq: Once | INTRAVENOUS | Status: DC

## 2017-12-09 MED ORDER — DILTIAZEM HCL-DEXTROSE 100-5 MG/100ML-% IV SOLN (PREMIX)
5.0000 mg/h | INTRAVENOUS | Status: DC
  Administered 2017-12-09: 5 mg/h via INTRAVENOUS
  Filled 2017-12-09: qty 100

## 2017-12-09 MED ORDER — SODIUM CHLORIDE 0.9% FLUSH
10.0000 mL | INTRAVENOUS | Status: DC | PRN
  Administered 2017-12-10 – 2017-12-11 (×2): 20 mL
  Administered 2017-12-12 (×2): 10 mL
  Filled 2017-12-09 (×4): qty 40

## 2017-12-09 MED ORDER — METOPROLOL TARTRATE 5 MG/5ML IV SOLN
2.5000 mg | INTRAVENOUS | Status: AC | PRN
  Administered 2017-12-09 (×3): 2.5 mg via INTRAVENOUS
  Filled 2017-12-09 (×2): qty 5

## 2017-12-09 MED ORDER — ENOXAPARIN SODIUM 80 MG/0.8ML ~~LOC~~ SOLN
70.0000 mg | Freq: Two times a day (BID) | SUBCUTANEOUS | Status: DC
  Filled 2017-12-09: qty 0.8

## 2017-12-09 MED ORDER — HEPARIN BOLUS VIA INFUSION
3000.0000 [IU] | Freq: Once | INTRAVENOUS | Status: AC
  Administered 2017-12-09: 3000 [IU] via INTRAVENOUS
  Filled 2017-12-09: qty 3000

## 2017-12-09 MED ORDER — ONDANSETRON HCL 4 MG/2ML IJ SOLN
4.0000 mg | Freq: Four times a day (QID) | INTRAMUSCULAR | Status: DC | PRN
  Administered 2017-12-09 – 2017-12-10 (×2): 4 mg via INTRAVENOUS
  Filled 2017-12-09 (×2): qty 2

## 2017-12-09 MED ORDER — SODIUM CHLORIDE 0.9 % IV SOLN
INTRAVENOUS | Status: DC
  Administered 2017-12-09 – 2017-12-11 (×3): via INTRAVENOUS

## 2017-12-09 MED ORDER — ONDANSETRON HCL 4 MG PO TABS: 4.0000 mg | ORAL_TABLET | Freq: Four times a day (QID) | ORAL | Status: DC | PRN

## 2017-12-09 MED ORDER — AMIODARONE HCL IN DEXTROSE 360-4.14 MG/200ML-% IV SOLN
30.0000 mg/h | INTRAVENOUS | Status: DC
  Administered 2017-12-09 – 2017-12-10 (×4): 30 mg/h via INTRAVENOUS
  Filled 2017-12-09 (×9): qty 200

## 2017-12-09 MED ORDER — AMIODARONE HCL IN DEXTROSE 360-4.14 MG/200ML-% IV SOLN
60.0000 mg/h | INTRAVENOUS | Status: DC
  Administered 2017-12-09: 60 mg/h via INTRAVENOUS
  Filled 2017-12-09: qty 200

## 2017-12-09 NOTE — ED Notes (Signed)
ORDERD DIET TRAY FOR PT

## 2017-12-09 NOTE — Significant Event (Addendum)
Rapid Response Event Note  Overview: AFIB RVR  Initial Focused Assessment:  Called by charge RN about needing assistance with managing HR.  Patient is currently on an AMIO infusion at 30 mg/hr.  Earlier in the day, patient was given the AMIO bolus and AMIO load for 6 hours.  Upon arrival HR was in the 140s, per RN, HR has been as high as 160. SBP > 110 and MAP > 65.  TRH NP ordered Lopressor 2.5mg  IV x 3 doses. Patient is neuro intact, follow commands, generalized weakness (several CVAs in the past), not in acute distress, skin warm and dry, + pulses.   Interventions: -- Lopressor 2.5mg  IV x 3  -- PIV x 2 attempted, patient does have a Port-A-Cath. VAST RN was called and it was accessed.  Plan of Care: -- I paged the [facility] NP on call, updated her on the patient's condition, plan to is monitor HR and to notify NP if HR is greater than 130 sustained over 5 minutes or if the patient becomes symptomatic (hypotensive, shocky etc.)  Event Summary:   at    Call Time 2102 Arrival Time 2107 End Time 2215  Jeff [name] R

## 2017-12-09 NOTE — Consult Note (Addendum)
Name: Jeff [name] MRN: [id number] DOB: 1956-04-12    ADMISSION DATE:  12/08/2017 CONSULTATION DATE:  12/08/2017  REFERRING MD :  Dr. Tamala Julian   CHIEF COMPLAINT:  AMS  HISTORY OF PRESENT ILLNESS:   62 year old male with PMH of HTN, A.Fib, CVA, s/p Mechanical AV and MV, Systolic Heart Failure (EF 30-35), DVT on chronic anticoagulation, with metastatic pancreatic cancer (Follwed by Dr. Marin Olp) with subvalvular mobile mass consistent with a healed vegetation and highly mobile  Presents to ED on 4/27 with confusion, hypotension, weakness. Upon arrival HR 165, BP 110/85. LA 3.84. Patient given Cardizem (However stopped due to hypotension). Cardiology (Dr. [name] consulted recommended amiodarone gtt. PCCM asked to consult.    SIGNIFICANT EVENTS  4/27 > Presents to ED  STUDIES:  CXR 4/27 > Slightly low lung volumes. Bibasilar atelectasis is noted. There is no pleural effusion. No pulmonary consolidation or pulmonary edema. No aggressive osseous lesions. Port catheter is noted tip at the cavoatrial junction. Median sternotomy sutures are in place. Heart is top-normal in size. There is mild aortic atherosclerosis with uncoiling of the thoracic aorta. No acute osseous abnormality. CT A/P 4/28 >>  CT Head 4/28 >>   PAST MEDICAL HISTORY :   has a past medical history of Acute ischemic stroke ([place]), Goals of care, counseling/discussion (11/30/2017), H/O mitral valve replacement with mechanical valve, Heart disease, High cholesterol, History of CVA (cerebrovascular accident), aortic valve replacement, mechanical, long term anticoagulant use, Iron deficiency anemia due to chronic blood loss (12/05/2017), and Stroke ([place]).  has a past surgical history that includes Cardiac valve replacement; IR FLUORO GUIDE PORT INSERTION RIGHT (11/13/2017); IR US Guide Vasc Access Right (11/13/2017); and TEE without cardioversion (N/A, 11/22/2017). Prior to Admission medications   Medication Sig Start Date End Date Taking?  Authorizing Provider  aspirin EC 81 MG EC tablet Take 1 tablet (81 mg total) by mouth daily. 07/31/17  Yes Cherene Altes, MD  dronabinol (MARINOL) 5 MG capsule Take 1 capsule (5 mg total) by mouth 2 (two) times daily before lunch and supper. 11/30/17  Yes Volanda Napoleon, MD  diltiazem (CARDIZEM CD) 180 MG 24 hr capsule Take 1 capsule (180 mg total) by mouth daily. 11/22/17 11/22/18  Adrian Prows, MD  enoxaparin (LOVENOX) 80 MG/0.8ML injection Inject 0.75 mLs (75 mg total) into the skin every 12 (twelve) hours. 11/16/17   Caren Griffins, MD  lidocaine-prilocaine (EMLA) cream Apply 1 application topically as needed. 11/30/17   Volanda Napoleon, MD  lidocaine-prilocaine (EMLA) cream Apply to affected area once 12/05/17   Volanda Napoleon, MD  lipase/protease/amylase (CREON) 36000 UNITS CPEP capsule Take 1 capsule (36,000 Units total) by mouth 3 (three) times daily before meals. 11/16/17   Caren Griffins, MD  LORazepam (ATIVAN) 0.5 MG tablet Take 1 tablet (0.5 mg total) by mouth every 6 (six) hours as needed (Nausea or vomiting). 12/05/17   Volanda Napoleon, MD  metoprolol tartrate (LOPRESSOR) 25 MG tablet Take 1 tablet (25 mg total) by mouth 2 (two) times daily. 11/22/17 11/22/18  Adrian Prows, MD  ondansetron (ZOFRAN) 8 MG tablet Take 1 tablet (8 mg total) by mouth 2 (two) times daily. 12/05/17   Volanda Napoleon, MD  oxyCODONE (OXYCONTIN) 15 mg 12 hr tablet Take 1 tablet (15 mg total) by mouth every 12 (twelve) hours. 11/30/17   Volanda Napoleon, MD  oxyCODONE (ROXICODONE) 5 MG immediate release tablet Take 1 tablet (5 mg total) by mouth every 8 (eight) hours  as needed for up to 30 doses. 11/22/17   Adrian Prows, MD  oxyCODONE 10 MG TABS Take 1 tablet (10 mg total) by mouth every 6 (six) hours as needed for severe pain. 11/30/17   Volanda Napoleon, MD  prochlorperazine (COMPAZINE) 10 MG tablet Take 1 tablet (10 mg total) by mouth every 6 (six) hours as needed for nausea or vomiting. 12/05/17   Volanda Napoleon,  MD   No Known Allergies  FAMILY HISTORY:  family history includes Diabetes in his mother; Heart attack in his brother. SOCIAL HISTORY:  reports that he has been smoking cigarettes.  He has been smoking about 0.05 packs per day. He has never used smokeless tobacco. He reports that he drinks alcohol. He reports that he has current or past drug history. Drug: Marijuana. Frequency: 7.00 times per week.  REVIEW OF SYSTEMS:   All negative; except for those that are bolded, which indicate positives.  Constitutional: weight loss, weight gain, night sweats, fevers, chills, fatigue, weakness.  HEENT: headaches, sore throat, sneezing, nasal congestion, post nasal drip, difficulty swallowing, tooth/dental problems, visual complaints, visual changes, ear aches. Neuro: difficulty with speech, weakness, numbness, ataxia. CV:  chest pain, orthopnea, PND, swelling in lower extremities, dizziness, palpitations, syncope.  Resp: cough, hemoptysis, dyspnea, wheezing. GI: heartburn, indigestion, abdominal pain, nausea, vomiting, diarrhea, constipation, change in bowel habits, loss of appetite, hematemesis, melena, hematochezia.  GU: dysuria, change in color of urine, urgency or frequency, flank pain, hematuria. MSK: joint pain or swelling, decreased range of motion. Psych: change in mood or affect, depression, anxiety, suicidal ideations, homicidal ideations. Skin: rash, itching, bruising.   SUBJECTIVE:   VITAL SIGNS: Temp:  [99.9 F (37.7 C)] 99.9 F (37.7 C) (04/27 2002) Pulse Rate:  [70-163] 76 (04/28 0600) Resp:  [22-32] 24 (04/28 0600) BP: (86-119)/(60-97) 103/77 (04/28 0600) SpO2:  [85 %-100 %] 100 % (04/28 0600)  PHYSICAL EXAMINATION: General:  Elderly adult male, no distress  Neuro:  Alert, follows commands, slow to respond  HEENT:  MMM Cardiovascular:  Tachy, Irregular, no MRG  Lungs:  Clear breath sounds, no wheeze/crackles  Abdomen:  Soft, distended, generalized pain  Musculoskeletal:   -edema  Skin:  Warm, dry, intact   Recent Labs  Lab 12/05/17 1102 12/08/17 2009 12/09/17 0332  NA 141 136 138  K 4.2 4.4 4.7  CL 103 100* 106  CO2 28 23 24   BUN 22 28* 29*  CREATININE 1.30* 1.03 1.05  GLUCOSE 163* 173* 159*   Recent Labs  Lab 12/05/17 1102 12/08/17 2009 12/09/17 0332  HGB 10.8* 9.8* 8.5*  HCT 33.6* 30.3* 26.0*  WBC 15.9* 16.7* 11.5*  PLT 429* 230 153   Dg Chest Portable 1 View  Result Date: 12/08/2017 CLINICAL DATA:  Altered mental status since yesterday. Patient had first chemotherapy treatment on Wednesday for pancreatic cancer with metastasis to the liver. EXAM: PORTABLE CHEST 1 VIEW COMPARISON:  11/07/2017 FINDINGS: Slightly low lung volumes. Bibasilar atelectasis is noted. There is no pleural effusion. No pulmonary consolidation or pulmonary edema. No aggressive osseous lesions. Port catheter is noted tip at the cavoatrial junction. Median sternotomy sutures are in place. Heart is top-normal in size. There is mild aortic atherosclerosis with uncoiling of the thoracic aorta. No acute osseous abnormality. IMPRESSION: Slightly low lung volumes since prior with bibasilar atelectasis. No active pulmonary disease. Aortic atherosclerosis. Electronically Signed   By: Ashley Royalty M.D.   On: 12/08/2017 22:10    ASSESSMENT / PLAN:  A.Fib RVR  Subvalvular  mobile mass consistent with a healed vegetation and highly mobile H/O HTN, s/p Mechanical AV and MV, Systolic Heart Failure (EF 30-35) Plan  -Cardiology Consulted  -Cardiac Monitoring  -Continue Amiodarone gtt  -Fluids as below   Lactic Acidosis in setting of hypoperfusion vs sepsis  Plan  -Trend PCT (6.84)  -Trend WBC and Fever Curve  -Follow Culture Data  -Has received Zosyn and Vancomycin in ED  -Bolus 2L LR over 4 hours  -CT A/P pending   Acute Encephalopathy in setting of infection vs metastatic disease  Plan  -Monitor  -Head CT to rule out acute neuro process    Pancreatic Cancer with  metastases to Liver  -Followed by Dr. Marin Olp  -Received First round of Chemo 4/24 Plan  -Per Oncology  -Palliative care consult ordered   -Patient and Daughters both state they have spoke about goals of care in the past and all agree upon DNR - EMR updated   Patient is currently stable to stay step-down status with [facility] as primary.   Hayden Pedro, AGACNP-BC [facility]  Pgr: [phone]  PCCM Pgr: [phone]

## 2017-12-09 NOTE — Progress Notes (Signed)
Jeff [name] TEAM 1 - Stepdown/ICU TEAM  Jeff [name]  ELF:[id number] DOB: 1955-11-30 DOA: 12/08/2017 PCP: Jeff Perna, NP    Brief Narrative:  62yo M w/ a hx of HTN, A. fib, CVA,  S/p mechanical AV & MV on chronic anticoagulation, DVT, and metastatic pancreatic cancer followed by Dr. Marin Olp who presented after being found to be progressively more altered since initiating chemotherapy on 12/05/2017.  Associated symptoms included cough, vomiting, diarrhea, worsening abdominal, and poor p.o. intake.   In the ED the patient was found to be in A. fib with RVR with heart rate up to 162 bpm.  He also met sepsis criteria.  Significant Events: 4/27 admit   Subjective: Pt is seen for a f/u visit.    Assessment & Plan:  Sepsis, unknown origin tachycardic with WBC 16.7 and lactic acid 3.84 - CXR unrevealing - previously noted mitral valve thrombus versus vegetation w/ endocarditis a possible cause   Acute encephalopathy  A. 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 Marin Olp - received his first round of chemotherapy on 4/24.  Left leg DVT Continue anticoagulation of Lovenox  History of CVA   DVT prophylaxis: IV heparin  Code Status: FULL CODE Family Communication: no family present at time of exam  Disposition Plan:   Consultants:  PCCM Cardiology   Antimicrobials:  Zosyn 4/27 > Vanc 4/27 >   Objective: Blood pressure (!) 112/92, [name] (!) 153, temperature 99.9 F (37.7 C), temperature source Oral, resp. rate (!) 25, SpO2 100 %.  Intake/Output Summary (Last 24 hours) at 12/09/2017 1510 Last data filed at 12/09/2017 1303 Gross per 24 hour  Intake 1325 ml  Output -  Net 1325 ml   There were no vitals filed for this  visit.  Examination: Pt was seen for a f/u visit  CBC: Recent Labs  Lab 12/05/17 1102 12/08/17 2009 12/09/17 0332  WBC 15.9* 16.7* 11.5*  NEUTROABS 12.4*  --   --   HGB 10.8* 9.8* 8.5*  HCT 33.6* 30.3* 26.0*  MCV 95.2 91.0 90.9  PLT 429* 230 277   Basic Metabolic Panel: Recent Labs  Lab 12/05/17 1102 12/08/17 2009 12/09/17 0332 12/09/17 0652  NA 141 136 138  --   K 4.2 4.4 4.7  --   CL 103 100* 106  --   CO2 '28 23 24  '$ --   GLUCOSE 163* 173* 159*  --   BUN 22 28* 29*  --   CREATININE 1.30* 1.03 1.05  --   CALCIUM 9.9 8.8* 8.2*  --   MG  --   --   --  2.0  PHOS  --   --   --  3.0   GFR: Estimated Creatinine Clearance: 73.9 mL/min (by C-G formula based on SCr of 1.05 mg/dL).  Liver Function Tests: Recent Labs  Lab 12/05/17 1102 12/08/17 2009  AST 84* 204*  ALT 61* 131*  ALKPHOS 566* 352*  BILITOT 2.7* 5.4*  PROT 7.2 6.4*  ALBUMIN 3.4* 2.6*   Recent Labs  Lab 12/08/17 2147  LIPASE 19   Recent Labs  Lab 12/08/17 2327  AMMONIA 30    HbA1C: Hgb A1c MFr Bld  Date/Time Value Ref Range Status  11/08/2017 04:59 AM 4.7 (L) 4.8 - 5.6 % Final  Comment:    (NOTE) Pre diabetes:          5.7%-6.4% Diabetes:              >6.4% Glycemic control for   <7.0% adults with diabetes   07/30/2017 07:00 AM 5.6 4.8 - 5.6 % Final    Comment:    (NOTE)         Prediabetes: 5.7 - 6.4         Diabetes: >6.4         Glycemic control for adults with diabetes: <7.0     CBG: Recent Labs  Lab 12/08/17 2018  GLUCAP 158*    Recent Results (from the past 240 hour(s))  Blood Culture (routine x 2)     Status: None (Preliminary result)   Collection Time: 12/08/17  9:30 PM  Result Value Ref Range Status   Specimen Description BLOOD LEFT HAND  Final   Special Requests   Final    BOTTLES DRAWN AEROBIC AND ANAEROBIC Blood Culture adequate volume   Culture   Final    NO GROWTH < 12 HOURS Performed at [facility], [address] [place], [place] [address]     Report Status PENDING  Incomplete  Blood Culture (routine x 2)     Status: None (Preliminary result)   Collection Time: 12/08/17  9:45 PM  Result Value Ref Range Status   Specimen Description BLOOD LEFT HAND  Final   Special Requests   Final    BOTTLES DRAWN AEROBIC AND ANAEROBIC Blood Culture adequate volume   Culture   Final    NO GROWTH < 12 HOURS Performed at [facility], [place] [address] [place], [place] [address]    Report Status PENDING  Incomplete     Scheduled Meds: . iopamidol      . sodium chloride flush  3 mL Intravenous Q12H   Continuous Infusions: . sodium chloride 100 mL/hr at 12/09/17 0646  . amiodarone 30 mg/hr (12/09/17 0738)  . heparin 1,100 Units/hr (12/09/17 0851)  . piperacillin-tazobactam (ZOSYN)  IV 3.375 g (12/09/17 0520)  . vancomycin Stopped (12/09/17 1303)     LOS: 0 days   Cherene Altes, MD [facility] Office  [phone] Pager - Text Page per Amion as per below:  On-Call/Text Page:      [email or web address]      password [email or web address]  If 7PM-7AM, please contact night-coverage [email or web address] Password [email or web address] 12/09/2017, 3:10 PM

## 2017-12-09 NOTE — Progress Notes (Signed)
ANTICOAGULATION CONSULT NOTE - Initial Consult  Pharmacy Consult for Heparin  Indication: atrial fibrillation, thrombus on mitral valve  No Known Allergies  Patient Measurements: 72 kg  Vital Signs: Temp: 99.9 F (37.7 C) (04/27 2002) Temp Source: Oral (04/27 2002) BP: 103/77 (04/28 0600) Pulse Rate: 76 (04/28 0600)  Labs: Recent Labs    12/08/17 2009 12/09/17 0332  HGB 9.8* 8.5*  HCT 30.3* 26.0*  PLT 230 153  CREATININE 1.03 1.05    Estimated Creatinine Clearance: 73.9 mL/min (by C-G formula based on SCr of 1.05 mg/dL).   Medical History: Past Medical History:  Diagnosis Date  . Acute ischemic stroke ([place])   . Goals of care, counseling/discussion 11/30/2017  . H/O mitral valve replacement with mechanical valve   . Heart disease   . High cholesterol   . History of CVA (cerebrovascular accident)    02/2011  . Hx of aortic valve replacement, mechanical   . HX: long term anticoagulant use   . Iron deficiency anemia due to chronic blood loss 12/05/2017  . Stroke [facility])     Assessment: 62 y/o M with newly dx CA just started chemo, already on Lovenox PTA for likely thrombus on mitral valve, now with new onset afib, to transition to IV heparin, last Lovenox >12 hours ago, will start heparin now. Hgb 8.5. Plts 153. Renal function good.   Goal of Therapy:  Heparin level 0.3-0.7 units/ml Monitor platelets by anticoagulation protocol: Yes   Plan:  -Start heparin drip at 1100 units/hr -1500 HL -Daily CBC/HL -Back to Lovenox as able  Narda Bonds 12/09/2017,6:30 AM

## 2017-12-09 NOTE — Progress Notes (Signed)
Pt noted to be added to our rounding list earlier today at 4:10am. The overnight fellow did not sign out the need to see this patient. Appears he is a [facility] patient. Spoke with Dr. Thereasa Solo to make him aware to call Dr. Irven Shelling practice if cardiology assistance is needed. Keiton Cosma PA-C

## 2017-12-09 NOTE — Progress Notes (Signed)
ANTICOAGULATION CONSULT NOTE - Follow Up Consult  Pharmacy Consult for heparin Indication: atrial fibrillation and thrombus on mitral valve  No Known Allergies  Patient Measurements:   Heparin Dosing Weight: 72kg  Vital Signs: BP: 121/92 (04/28 1630) Pulse Rate: 72 (04/28 1615)  Labs: Recent Labs    12/08/17 2009 12/09/17 0332 12/09/17 1504  HGB 9.8* 8.5*  --   HCT 30.3* 26.0*  --   PLT 230 153  --   HEPARINUNFRC  --   --  <0.10*  CREATININE 1.03 1.05  --     Estimated Creatinine Clearance: 73.9 mL/min (by C-G formula based on SCr of 1.05 mg/dL).   Assessment: 62 y/o M with newly dx CA just started chemo, already on Lovenox PTA for likely thrombus on mitral valve- last dose given 4/27. Now with new onset afib > to transition to IV heparin.  Initial heparin level undetectable- no issues with infusion per RN Luellen Pucker. Hgb low, plts normal- no bleeding noted.  Goal of Therapy:  Heparin level 0.3-0.7 units/ml Monitor platelets by anticoagulation protocol: Yes   Plan:  Given undetectable level and time from last Lovenox dose, will bolus with heparin 3000 units IV x1, then increase infusion to 1300 units/hr Heparin level in 6h Daily heparin level and CBC Follow long term anticoagulation plans  Nason Conradt D. Krishon Adkison, PharmD, BCPS Clinical Pharmacist  [phone] 12/09/2017 4:45 PM

## 2017-12-09 NOTE — H&P (Addendum)
History and Physical    Jeff [name] AYT:[id number] DOB: 1956-06-02 DOA: 12/08/2017  Referring MD/NP/PA: Frederich Balding, PA-C PCP: Kerin Perna, NP  Patient coming from: Home   Chief Complaint: Altered mental status  I have personally briefly reviewed patient's old medical records in [place]   HPI: Jeff [name] is a 62 y.o. male with medical history significant of HTN, A. fib, CVA,  S/p mechanical AV & MV, DVT, on chronic anticoagulation, and metastatic pancreatic cancer followed by Dr. Marin Olp; who presents after being found to be progressively more altered since initiating chemotherapy on 12/05/2017.  Associated symptoms included cough, vomiting, diarrhea, complains of worsening abdominal and poor p.o. intake.  History is obtained per review of records as patient is altered.  Patient has had a progressive decline.  He presented with slurred speech 08/08/2017 was noted to have had an acute ischemic stroke after being switched from Coumadin to Xarelto back .  Subsequently, patient was admitted in 10/2017 after recently being diagnosed with a left lower extremity DVT and was found to have a pancreatic mass with liver metastases.  On 4/11 patient was admitted and underwent TEE by Dr. Einar Gip found to have EF of 30 to 35% with highly mobile thrombus attached to the subvalvular apparatus versus vegetation on the mitral valve noted. He was set up with Dr. Marin Olp to start palliative chemotherapy on 4/24.    ED Course: Upon admission into the emergency department patient was noted to have a temperature of 99.9 F, found to be in A. fib with RVR with heart rate up to 162 bpm, respiration 22-27, blood pressure 90/60-119/79, and o2sat 98-100% on RA.  Labs revealed BC 16.7, hemoglobin 9.8, hemoglobin 20.8, creatinine 1.03, glucose 173, ammonia 30, alkaline phosphatase 352, AST 204, ALT 139, and total bilirubin 5.4.  Sepsis protocol was initiated and patient given 2 liters of NS IVFs with empiric  antibiotics of vancomycin and Zosyn.  Patient was placed on a diltiazem drip for atrial fibrillation  Review of Systems  Unable to perform ROS: Mental status change  Constitutional: Positive for malaise/fatigue.  Eyes: Negative for photophobia.  Cardiovascular: Negative for leg swelling.  Gastrointestinal: Positive for abdominal pain, nausea and vomiting.  Genitourinary: Positive for dysuria.    Past Medical History:  Diagnosis Date  . Acute ischemic stroke ([place])   . Goals of care, counseling/discussion 11/30/2017  . H/O mitral valve replacement with mechanical valve   . Heart disease   . High cholesterol   . History of CVA (cerebrovascular accident)    02/2011  . Hx of aortic valve replacement, mechanical   . HX: long term anticoagulant use   . Iron deficiency anemia due to chronic blood loss 12/05/2017  . Stroke [facility])     Past Surgical History:  Procedure Laterality Date  . CARDIAC VALVE REPLACEMENT    . IR FLUORO GUIDE PORT INSERTION RIGHT  11/13/2017  . IR US GUIDE VASC ACCESS RIGHT  11/13/2017  . TEE WITHOUT CARDIOVERSION N/A 11/22/2017   Procedure: TRANSESOPHAGEAL ECHOCARDIOGRAM (TEE);  Surgeon: Adrian Prows, MD;  Location: [facility] ENDOSCOPY;  Service: Cardiovascular;  Laterality: N/A;     reports that he has been smoking cigarettes.  He has been smoking about 0.05 packs per day. He has never used smokeless tobacco. He reports that he drinks alcohol. He reports that he has current or past drug history. Drug: Marijuana. Frequency: 7.00 times per week.  No Known Allergies  Family History  Problem Relation Age of Onset  .  Diabetes Mother        Died before her 76 birthday  . Heart attack Brother     Prior to Admission medications   Medication Sig Start Date End Date Taking? Authorizing Provider  aspirin EC 81 MG EC tablet Take 1 tablet (81 mg total) by mouth daily. 07/31/17  Yes Cherene Altes, MD  dronabinol (MARINOL) 5 MG capsule Take 1 capsule (5 mg total) by mouth 2 (two)  times daily before lunch and supper. 11/30/17  Yes Volanda Napoleon, MD  diltiazem (CARDIZEM CD) 180 MG 24 hr capsule Take 1 capsule (180 mg total) by mouth daily. 11/22/17 11/22/18  Adrian Prows, MD  enoxaparin (LOVENOX) 80 MG/0.8ML injection Inject 0.75 mLs (75 mg total) into the skin every 12 (twelve) hours. 11/16/17   Caren Griffins, MD  lidocaine-prilocaine (EMLA) cream Apply 1 application topically as needed. 11/30/17   Volanda Napoleon, MD  lidocaine-prilocaine (EMLA) cream Apply to affected area once 12/05/17   Volanda Napoleon, MD  lipase/protease/amylase (CREON) 36000 UNITS CPEP capsule Take 1 capsule (36,000 Units total) by mouth 3 (three) times daily before meals. 11/16/17   Caren Griffins, MD  LORazepam (ATIVAN) 0.5 MG tablet Take 1 tablet (0.5 mg total) by mouth every 6 (six) hours as needed (Nausea or vomiting). 12/05/17   Volanda Napoleon, MD  metoprolol tartrate (LOPRESSOR) 25 MG tablet Take 1 tablet (25 mg total) by mouth 2 (two) times daily. 11/22/17 11/22/18  Adrian Prows, MD  ondansetron (ZOFRAN) 8 MG tablet Take 1 tablet (8 mg total) by mouth 2 (two) times daily. 12/05/17   Volanda Napoleon, MD  oxyCODONE (OXYCONTIN) 15 mg 12 hr tablet Take 1 tablet (15 mg total) by mouth every 12 (twelve) hours. 11/30/17   Volanda Napoleon, MD  oxyCODONE (ROXICODONE) 5 MG immediate release tablet Take 1 tablet (5 mg total) by mouth every 8 (eight) hours as needed for up to 30 doses. 11/22/17   Adrian Prows, MD  oxyCODONE 10 MG TABS Take 1 tablet (10 mg total) by mouth every 6 (six) hours as needed for severe pain. 11/30/17   Volanda Napoleon, MD  prochlorperazine (COMPAZINE) 10 MG tablet Take 1 tablet (10 mg total) by mouth every 6 (six) hours as needed for nausea or vomiting. 12/05/17   Volanda Napoleon, MD    Physical Exam:  Constitutional: Cachectic and ill appearing male who is alert and able to follow commands, but intermittently is confused  Vitals:   12/09/17 0000 12/09/17 0030 12/09/17 0100 12/09/17  0106  BP: 98/81 104/82 99/78 100/86  Pulse: (!) 158 (!) 162    Resp: (!) 26 (!) 23 (!) 26   Temp:      TempSrc:      SpO2: 100% 98%     Eyes: PERRL, lids and conjunctivae normal ENMT: Mucous membranes are dry. Posterior pharynx clear of any exudate or lesions.  Neck: normal, supple, no masses, no thyromegaly Respiratory: Mildly tachypneic no significant crackles or rhonchi appreciated Cardiovascular: Tachycardic and irregular iregular. patient without lower extremity swelling. Abdomen: Tense abdomen with tenderness to palpation.  Patient has active bowel sounds Musculoskeletal: no clubbing / cyanosis. No joint deformity upper and lower extremities. Good ROM, no contractures. Normal muscle tone.  Skin: no rashes, lesions, ulcers. No induration Neurologic: CN 2-12 grossly intact. Sensation intact, DTR normal. Strength 5/5 in all 4.  Psychiatric: Alert and oriented to person and place.   Labs on Admission: I have personally reviewed  following labs and imaging studies  CBC: Recent Labs  Lab 12/05/17 1102 12/08/17 2009  WBC 15.9* 16.7*  NEUTROABS 12.4*  --   HGB 10.8* 9.8*  HCT 33.6* 30.3*  MCV 95.2 91.0  PLT 429* 606   Basic Metabolic Panel: Recent Labs  Lab 12/05/17 1102 12/08/17 2009  NA 141 136  K 4.2 4.4  CL 103 100*  CO2 28 23  GLUCOSE 163* 173*  BUN 22 28*  CREATININE 1.30* 1.03  CALCIUM 9.9 8.8*   GFR: Estimated Creatinine Clearance: 75.4 mL/min (by C-G formula based on SCr of 1.03 mg/dL). Liver Function Tests: Recent Labs  Lab 12/05/17 1102 12/08/17 2009  AST 84* 204*  ALT 61* 131*  ALKPHOS 566* 352*  BILITOT 2.7* 5.4*  PROT 7.2 6.4*  ALBUMIN 3.4* 2.6*   Recent Labs  Lab 12/08/17 2147  LIPASE 19   Recent Labs  Lab 12/08/17 2327  AMMONIA 30   Coagulation Profile: No results for input(s): INR, PROTIME in the last 168 hours. Cardiac Enzymes: No results for input(s): CKTOTAL, CKMB, CKMBINDEX, TROPONINI in the last 168 hours. BNP (last 3  results) No results for input(s): PROBNP in the last 8760 hours. HbA1C: No results for input(s): HGBA1C in the last 72 hours. CBG: Recent Labs  Lab 12/08/17 2018  GLUCAP 158*   Lipid Profile: No results for input(s): CHOL, HDL, LDLCALC, TRIG, CHOLHDL, LDLDIRECT in the last 72 hours. Thyroid Function Tests: No results for input(s): TSH, T4TOTAL, FREET4, T3FREE, THYROIDAB in the last 72 hours. Anemia Panel: No results for input(s): VITAMINB12, FOLATE, FERRITIN, TIBC, IRON, RETICCTPCT in the last 72 hours. Urine analysis:    Component Value Date/Time   COLORURINE AMBER (A) 12/08/2017 2242   APPEARANCEUR HAZY (A) 12/08/2017 2242   LABSPEC 1.024 12/08/2017 2242   PHURINE 5.0 12/08/2017 2242   GLUCOSEU NEGATIVE 12/08/2017 2242   HGBUR SMALL (A) 12/08/2017 2242   BILIRUBINUR MODERATE (A) 12/08/2017 2242   KETONESUR NEGATIVE 12/08/2017 2242   PROTEINUR 100 (A) 12/08/2017 2242   UROBILINOGEN 1.0 07/10/2013 1401   NITRITE NEGATIVE 12/08/2017 2242   LEUKOCYTESUR NEGATIVE 12/08/2017 2242   Sepsis Labs: No results found for this or any previous visit (from the past 240 hour(s)).   Radiological Exams on Admission: Dg Chest Portable 1 View  Result Date: 12/08/2017 CLINICAL DATA:  Altered mental status since yesterday. Patient had first chemotherapy treatment on Wednesday for pancreatic cancer with metastasis to the liver. EXAM: PORTABLE CHEST 1 VIEW COMPARISON:  11/07/2017 FINDINGS: Slightly low lung volumes. Bibasilar atelectasis is noted. There is no pleural effusion. No pulmonary consolidation or pulmonary edema. No aggressive osseous lesions. Port catheter is noted tip at the cavoatrial junction. Median sternotomy sutures are in place. Heart is top-normal in size. There is mild aortic atherosclerosis with uncoiling of the thoracic aorta. No acute osseous abnormality. IMPRESSION: Slightly low lung volumes since prior with bibasilar atelectasis. No active pulmonary disease. Aortic  atherosclerosis. Electronically Signed   By: Ashley Royalty M.D.   On: 12/08/2017 22:10    EKG: Independently reviewed.  A. fib with RVR at 168 bpm  Assessment/Plan Sepsis, unknown origin: Patient immunocompromised found to the tachycardic with WBC 16.7, and lactic acid elevated up to 3.84.  Chest x-ray and did not show any clear infiltrate and there are no was not significantly concerning for infection.  Sepsis protocol was initiated and patient was given 2 L bolus of IV fluids.  Patient was noted to have a mitral valve thrombus versus vegetation  w/ endocarditis  is a possible cause of symptoms. - Admit to stepdown bed - Follow-up blood cultures - Continue empiric antibiotics of vancomycin and Zosyn - Continue to trend lactic acid level - Patient may warrant ICU admission deteriorate as his a full code  Acute encephalopathy: Unclear cause of symptoms.  Possibly underlying infection  vs. Medication. - Neurochecks  - May warrant  MRI once more medically stable  A. fib with RVR on chronic anticoagulation: Acute on chronic.  Patient found to be in A. fib with RVR with heart rates up into the 160s.  Had just undergone cardioversion with cardiology on 4/11. - startedCardizem drip, but patient blood pressures dropped - Digoxin 0.25 mg IV stat - Consulted on-call doctor for Dr. 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 - Follow-up cardiology recommendations  Systolic congestive heart failure:  Last EF noted to be around 30 to 35% by TEE. - Strict I&O's  - Daily weights  Metastatic pancreatic cancer: Patient noted to have metastases to the liver followed by Dr. Marin Olp he received his first round of chemotherapy treatment on 4/24. - Dr. Marin Olp added to treatment team - Will need to discuss goals of care  Left leg  DVT - Continue anticoagulation of Lovenox  History of CVA: Patient last known stroke in 07/2017 with reported slurred speech at that time.  Elevated liver enzymes: acute on chronic.  Acute elevations in patient's ALP, AST, ALT, and total bilirubin levels.  Question possible acute obstruction or worsening progression of cancer.   DVT prophylaxis: Therapeutic Lovenox Code Status: Full   Family Communication: no family present at bedside Disposition Plan: TBD  Consults called: none  Admission status: inpatient  Norval Morton MD [facility] Pager [phone]   If 7PM-7AM, please contact night-coverage [email or web address] Password [email or web address]  12/09/2017, 1:23 AM

## 2017-12-09 NOTE — ED Triage Notes (Signed)
RT at bedside for Blood gas.

## 2017-12-09 NOTE — ED Notes (Signed)
Regular dinner tray ordered 

## 2017-12-10 ENCOUNTER — Inpatient Hospital Stay (HOSPITAL_COMMUNITY): Payer: Medicare Other

## 2017-12-10 ENCOUNTER — Other Ambulatory Visit: Payer: Self-pay | Admitting: Family

## 2017-12-10 DIAGNOSIS — M8448XA Pathological fracture, other site, initial encounter for fracture: Secondary | ICD-10-CM

## 2017-12-10 DIAGNOSIS — I82403 Acute embolism and thrombosis of unspecified deep veins of lower extremity, bilateral: Secondary | ICD-10-CM

## 2017-12-10 DIAGNOSIS — F17210 Nicotine dependence, cigarettes, uncomplicated: Secondary | ICD-10-CM

## 2017-12-10 DIAGNOSIS — Z515 Encounter for palliative care: Secondary | ICD-10-CM

## 2017-12-10 DIAGNOSIS — Z7189 Other specified counseling: Secondary | ICD-10-CM

## 2017-12-10 DIAGNOSIS — Z8673 Personal history of transient ischemic attack (TIA), and cerebral infarction without residual deficits: Secondary | ICD-10-CM

## 2017-12-10 DIAGNOSIS — R978 Other abnormal tumor markers: Secondary | ICD-10-CM

## 2017-12-10 DIAGNOSIS — C787 Secondary malignant neoplasm of liver and intrahepatic bile duct: Secondary | ICD-10-CM

## 2017-12-10 DIAGNOSIS — A403 Sepsis due to Streptococcus pneumoniae: Secondary | ICD-10-CM

## 2017-12-10 DIAGNOSIS — R410 Disorientation, unspecified: Secondary | ICD-10-CM

## 2017-12-10 DIAGNOSIS — I2699 Other pulmonary embolism without acute cor pulmonale: Principal | ICD-10-CM

## 2017-12-10 DIAGNOSIS — E7800 Pure hypercholesterolemia, unspecified: Secondary | ICD-10-CM

## 2017-12-10 LAB — COMPREHENSIVE METABOLIC PANEL
ALK PHOS: 294 U/L — AB (ref 38–126)
ALT: 103 U/L — AB (ref 17–63)
AST: 123 U/L — AB (ref 15–41)
Albumin: 2.1 g/dL — ABNORMAL LOW (ref 3.5–5.0)
Anion gap: 7 (ref 5–15)
BUN: 25 mg/dL — AB (ref 6–20)
CO2: 24 mmol/L (ref 22–32)
CREATININE: 0.89 mg/dL (ref 0.61–1.24)
Calcium: 7.9 mg/dL — ABNORMAL LOW (ref 8.9–10.3)
Chloride: 108 mmol/L (ref 101–111)
Glucose, Bld: 152 mg/dL — ABNORMAL HIGH (ref 65–99)
Potassium: 4.1 mmol/L (ref 3.5–5.1)
Sodium: 139 mmol/L (ref 135–145)
Total Bilirubin: 4.5 mg/dL — ABNORMAL HIGH (ref 0.3–1.2)
Total Protein: 5.3 g/dL — ABNORMAL LOW (ref 6.5–8.1)

## 2017-12-10 LAB — CBC WITH DIFFERENTIAL/PLATELET
BASOS ABS: 0 10*3/uL (ref 0.0–0.1)
Basophils Relative: 0 %
EOS PCT: 0 %
Eosinophils Absolute: 0 10*3/uL (ref 0.0–0.7)
HEMATOCRIT: 23.6 % — AB (ref 39.0–52.0)
HEMOGLOBIN: 7.6 g/dL — AB (ref 13.0–17.0)
LYMPHS ABS: 0.4 10*3/uL — AB (ref 0.7–4.0)
LYMPHS PCT: 5 %
MCH: 28.8 pg (ref 26.0–34.0)
MCHC: 32.2 g/dL (ref 30.0–36.0)
MCV: 89.4 fL (ref 78.0–100.0)
MONOS PCT: 1 %
Monocytes Absolute: 0.1 10*3/uL (ref 0.1–1.0)
Neutro Abs: 7.7 10*3/uL (ref 1.7–7.7)
Neutrophils Relative %: 94 %
Platelets: 91 10*3/uL — ABNORMAL LOW (ref 150–400)
RBC: 2.64 MIL/uL — ABNORMAL LOW (ref 4.22–5.81)
RDW: 15.9 % — AB (ref 11.5–15.5)
WBC: 8.2 10*3/uL (ref 4.0–10.5)

## 2017-12-10 LAB — MAGNESIUM: Magnesium: 1.8 mg/dL (ref 1.7–2.4)

## 2017-12-10 LAB — PHOSPHORUS: Phosphorus: 2.3 mg/dL — ABNORMAL LOW (ref 2.5–4.6)

## 2017-12-10 LAB — TSH: TSH: 1.447 u[IU]/mL (ref 0.350–4.500)

## 2017-12-10 LAB — PROCALCITONIN: PROCALCITONIN: 5.33 ng/mL

## 2017-12-10 LAB — HEPARIN LEVEL (UNFRACTIONATED): Heparin Unfractionated: <0.1 IU/mL — ABNORMAL LOW (ref 0.30–0.70)

## 2017-12-10 LAB — PREPARE RBC (CROSSMATCH)

## 2017-12-10 LAB — MRSA PCR SCREENING: MRSA by PCR: NEGATIVE

## 2017-12-10 MED ORDER — HEPARIN BOLUS VIA INFUSION
2000.0000 [IU] | Freq: Once | INTRAVENOUS | Status: AC
  Administered 2017-12-10: 2000 [IU] via INTRAVENOUS
  Filled 2017-12-10: qty 2000

## 2017-12-10 MED ORDER — IOPAMIDOL (ISOVUE-370) INJECTION 76%
INTRAVENOUS | Status: AC
  Filled 2017-12-10: qty 100

## 2017-12-10 MED ORDER — IOPAMIDOL (ISOVUE-370) INJECTION 76%
100.0000 mL | Freq: Once | INTRAVENOUS | Status: AC
  Administered 2017-12-10: 100 mL via INTRAVENOUS

## 2017-12-10 MED ORDER — MAGIC MOUTHWASH
10.0000 mL | Freq: Three times a day (TID) | ORAL | Status: DC
  Administered 2017-12-10 – 2017-12-13 (×13): 10 mL via ORAL
  Filled 2017-12-10 (×13): qty 10

## 2017-12-10 MED ORDER — SODIUM CHLORIDE 0.9 % IV SOLN
Freq: Once | INTRAVENOUS | Status: AC
  Administered 2017-12-10: 11:00:00 via INTRAVENOUS

## 2017-12-10 MED ORDER — FUROSEMIDE 10 MG/ML IJ SOLN: 20.0000 mg | Freq: Once | INTRAMUSCULAR | Status: DC

## 2017-12-10 MED ORDER — METOPROLOL TARTRATE 5 MG/5ML IV SOLN
2.5000 mg | INTRAVENOUS | Status: DC | PRN
  Administered 2017-12-10 (×2): 2.5 mg via INTRAVENOUS
  Filled 2017-12-10 (×2): qty 5

## 2017-12-10 MED ORDER — FUROSEMIDE 10 MG/ML IJ SOLN
20.0000 mg | Freq: Once | INTRAMUSCULAR | Status: AC
  Administered 2017-12-10: 20 mg via INTRAVENOUS
  Filled 2017-12-10: qty 2

## 2017-12-10 MED ORDER — FUROSEMIDE 10 MG/ML IJ SOLN
INTRAMUSCULAR | Status: AC
  Administered 2017-12-10: 20 mg
  Filled 2017-12-10: qty 2

## 2017-12-10 NOTE — Progress Notes (Signed)
Consult for DBIV received. Upon entering room, PRBC's infusing in PAC. Spoke with primary nurse to discuss if lab work can wait until post infusion. RN to call lab to obtain lab work.

## 2017-12-10 NOTE — Consult Note (Signed)
Consultation Note Date: 12/10/2017   Patient Name: Jeff [name]  DOB: 08-20-1955  MRN: [id number]  Age / Sex: 62 y.o., male  PCP: Kerin Perna, NP Referring Physician: Cherene Altes, MD  Reason for Consultation: Establishing goals of care and Hospice Evaluation  HPI/Patient Profile: 62 y.o. male  with past medical history of recently diagnosed pancreatic cancer with liver mets (s/p 1 chemo 4/24 with Dr. Marin Olp), s/p mechanical aortic valve and mitral valve, DVT, on chronic anticoagulation, s/p CVA, heart failure EF 30-35%, mitral valve thrombus admitted on 12/08/2017 with AMS and admitted with sepsis with atrial fibrillation requiring amiodarone infusion.   Clinical Assessment and Goals of Care: I came to [name]'s bedside today. No family or visitors at bedside. He has his lunch tray in front of him and I had just introduced myself to him and inquired about his lunch when he began retching and beginning to vomit. I requested nausea medication from RN and gave him cool washcloth and ginger ale. Also had lunch removed from bedside to prevent further nausea/vomiting.   After he had calmed and episode passed. I told him that I was just coming by to find out how he is feeling and doing. I inquired to his nausea to see if this is new. He says it is. When I asked if it were the food that made him nauseated he told me it was me and "people just keep coming in to talk with me." He says I should ask his doctors to find out how he is doing. It is clear that he was in no mood to talk at this time. I did request if he would mind if I called his daughters to see if they had questions or concerns but he tells me "no, so they can come back and bug me some more?" I told him that this was no problem and that I would leave him to rest now and that I hope he feels better. I did ask if I could check on him tomorrow and he  says yes.   Mr. Rickey was nauseated and then quite irritable (understandably) so we were not able to have a good conversation today. I also wonder if some of this irritability results from lingering confusion and me asking him questions. I will continue attempts to elicit [place] and offer support as allowed.   Primary Decision Maker PATIENT and 2 daughters    SUMMARY OF RECOMMENDATIONS   - Will continue attempts of [place] conversation hopefully with family present next time  Code Status/Advance Care Planning:  DNR - did not discuss   Symptom Management:   Nausea: Zofran prn.   Palliative Prophylaxis:   Aspiration, Bowel Regimen, Delirium Protocol and Frequent Pain Assessment  Psycho-social/Spiritual:   Desire for further Chaplaincy support:no  Additional Recommendations: Caregiving  Support/Resources, Education on Hospice and Grief/Bereavement Support  Prognosis:   Poor prognosis with metastatic pancreatic cancer but per notes planning for further chemotherapy. He is eligible for hospice but cannot accept hospice services unless  he were to forego further chemotherapy/treatment for his cancer.   Discharge Planning: To Be Determined      Primary Diagnoses: Present on Admission: . Sepsis ([place]) . Pancreas cancer ([place]) . Atrial fibrillation with RVR ([place]) . Acute encephalopathy . Mitral valve vegetation   I have reviewed the medical record, interviewed the patient and family, and examined the patient. The following aspects are pertinent.  Past Medical History:  Diagnosis Date  . Acute ischemic stroke ([place])   . Goals of care, counseling/discussion 11/30/2017  . H/O mitral valve replacement with mechanical valve   . Heart disease   . High cholesterol   . History of CVA (cerebrovascular accident)    02/2011  . Hx of aortic valve replacement, mechanical   . HX: long term anticoagulant use   . Iron deficiency anemia due to chronic blood loss 12/05/2017  . Stroke [facility])     Social History   Socioeconomic History  . Marital status: Single    Spouse name: Not on file  . Number of children: Not on file  . Years of education: Not on file  . Highest education level: Not on file  Occupational History  . Not on file  Social Needs  . Financial resource strain: Not on file  . Food insecurity:    Worry: Not on file    Inability: Not on file  . Transportation needs:    Medical: Not on file    Non-medical: Not on file  Tobacco Use  . Smoking status: Current Every Day Smoker    Packs/day: 0.05    Types: Cigarettes  . Smokeless tobacco: Never Used  . Tobacco comment: smoke two per day  Substance and Sexual Activity  . Alcohol use: Yes    Comment: wine coolers prn  . Drug use: Yes    Frequency: 7.0 times per week    Types: Marijuana    Comment: smoke when he can get it  . Sexual activity: Yes  Lifestyle  . Physical activity:    Days per week: Not on file    Minutes per session: Not on file  . Stress: Not on file  Relationships  . Social connections:    Talks on phone: Not on file    Gets together: Not on file    Attends religious service: Not on file    Active member of club or organization: Not on file    Attends meetings of clubs or organizations: Not on file    Relationship status: Not on file  Other Topics Concern  . Not on file  Social History Narrative  . Not on file   Family History  Problem Relation Age of Onset  . Diabetes Mother        Died before her 46 birthday  . Heart attack Brother    Scheduled Meds: . furosemide  20 mg Intravenous Once  . furosemide  20 mg Intravenous Once  . iopamidol      . lipase/protease/amylase  36,000 Units Oral TID AC  . magic mouthwash  10 mL Oral TID AC & HS  . metoprolol tartrate  25 mg Oral BID  . sodium chloride flush  10-40 mL Intracatheter Q12H  . sodium chloride flush  3 mL Intravenous Q12H   Continuous Infusions: . sodium chloride 125 mL/hr at 12/09/17 2101  . amiodarone 30 mg/hr  (12/10/17 0654)  . heparin 1,500 Units/hr (12/10/17 0516)  . piperacillin-tazobactam (ZOSYN)  IV 3.375 g (12/10/17 1331)  . vancomycin 1,000  mg (12/10/17 1044)   PRN Meds:.acetaminophen **OR** [DISCONTINUED] acetaminophen, levalbuterol, LORazepam, metoprolol tartrate, morphine injection, ondansetron **OR** ondansetron (ZOFRAN) IV, oxyCODONE, sodium chloride flush No Known Allergies Review of Systems  Constitutional: Positive for activity change and appetite change.  Gastrointestinal: Positive for nausea and vomiting.    Physical Exam  Constitutional: He appears well-developed. He appears ill.  Jaundice   HENT:  Head: Normocephalic and atraumatic.  Cardiovascular: An irregularly irregular rhythm present. Tachycardia present.  Pulmonary/Chest: Effort normal. No accessory muscle usage. No tachypnea. No respiratory distress.  Abdominal: Normal appearance.  Neurological: He is alert.  Mostly oriented  Nursing note and vitals reviewed.   Vital Signs: BP 100/76   Pulse (!) 118   Temp 97.7 F (36.5 C) (Oral)   Resp (!) 27   SpO2 100%  Pain Scale: 0-10   Pain Score: 0-No pain   SpO2: SpO2: 100 % O2 Device:SpO2: 100 % O2 Flow Rate: .   IO: Intake/output summary:   Intake/Output Summary (Last 24 hours) at 12/10/2017 1402 Last data filed at 12/10/2017 1112 Gross per 24 hour  Intake 2267.92 ml  Output -  Net 2267.92 ml    LBM: Last BM Date: (unknown) Baseline Weight:   Most recent weight:       Palliative Assessment/Data: 50%     Time Total: 35 min  Greater than 50%  of this time was spent counseling and coordinating care related to the above assessment and plan.  Signed by: Vinie Sill, NP Palliative Medicine Team Pager # [phone] (M-F 8a-5p) Team Phone # [phone] (Nights/Weekends)

## 2017-12-10 NOTE — Progress Notes (Signed)
Jeff [name] TEAM 1 - Stepdown/ICU TEAM  Jeff [name]  DPO:[id number] DOB: [birth year] DOA: 12/08/2017 PCP: Kerin Perna, NP    Brief Narrative:  62yo M w/ a hx of HTN, A. fib, CVA,  S/p mechanical AV & MV on chronic anticoagulation, DVT, and metastatic pancreatic cancer followed by Dr. Marin Olp who presented after being found to be progressively more altered since initiating chemotherapy on 12/05/2017.  Associated symptoms included cough, vomiting, diarrhea, worsening abdominal pain, and poor p.o. intake.   In the ED the patient was found to be in A. fib with RVR with heart rate up to 162 bpm.  He also met sepsis criteria.  Significant Events: 4/27 admit   Subjective: RVR control was challenging last night.  Heart rate better controlled at the time of visit.  The patient denies chest pain shortness breath fevers chills nausea vomiting or abdominal pain at this time.   Assessment & Plan:  SIRS v/s Sepsis, unknown origin tachycardic with WBC 16.7 and lactic acid 3.84 - CXR unrevealing - previously noted mitral valve thrombus versus vegetation w/ endocarditis a possible cause - doubt a true infectious source and suspect more related to new PE and Afib as well as DH - stop abx after 3 days of tx and follow   Acute encephalopathy/delirium v/s progressive dementia CT head without contrast 4/28 noted diffuse atrophy but no acute findings - check E-31 and folic acid levels - may need to consider MRI or contrast CT of the head  Transaminitis  Likely combination of shock liver plus liver metastasis - continue to volume expand and follow  A. 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 Marin Olp - received his first round of chemotherapy on 4/24 - due for his next dose on 5/1  Extensive B PE April 2019 - L LE DVT (initial timing unclear) CT angio this admit notes overall decrease in clot burden, but new clot in the R apical territory - continue anticoagulation w/ Lovenox  CVA Dec 2018   DVT prophylaxis: IV heparin  Code Status: FULL CODE Family Communication: no family present at time of exam  Disposition Plan: SDU  Consultants:  PCCM Cardiology   Antimicrobials:  Zosyn 4/27 > 4/29 Vanc 4/27 > 4/29  Objective: Blood pressure 100/76, pulse (!) 128, temperature 99 F (37.2 C), temperature source Oral, resp. rate (!) 27, SpO2 100 %.  Intake/Output Summary (Last 24 hours) at 12/10/2017 1636 Last data filed at 12/10/2017 1500 Gross per 24 hour  Intake 2612.92 ml  Output -  Net 2612.92 ml   There were no vitals filed for this visit.  Examination: General: No acute respiratory distress Lungs: Clear to auscultation bilaterally without wheezes or crackles Cardiovascular: Regular rate without gallop or rub normal  Abdomen: Nontender, nondistended, soft, bowel sounds positive, no rebound, no ascites, no appreciable mass Extremities: No significant  cyanosis, clubbing, or edema bilateral lower extremities   CBC: Recent Labs  Lab 12/05/17 1102 12/08/17 2009 12/09/17 0332 12/10/17 0354  WBC 15.9* 16.7* 11.5* 8.2  NEUTROABS 12.4*  --   --  7.7  HGB 10.8* 9.8* 8.5* 7.6*  HCT 33.6* 30.3* 26.0* 23.6*  MCV 95.2 91.0 90.9 89.4  PLT 429* 230 153 91*   Basic Metabolic Panel: Recent Labs  Lab 12/08/17 2009 12/09/17 0332  12/09/17 0652 12/10/17 0354  NA 136 138  --  139  K 4.4 4.7  --  4.1  CL 100* 106  --  108  CO2 23 24  --  24  GLUCOSE 173* 159*  --  152*  BUN 28* 29*  --  25*  CREATININE 1.03 1.05  --  0.89  CALCIUM 8.8* 8.2*  --  7.9*  MG  --   --  2.0 1.8  PHOS  --   --  3.0 2.3*   GFR: Estimated Creatinine Clearance: 87.2 mL/min (by C-G formula based on SCr of 0.89 mg/dL).  Liver Function Tests: Recent Labs  Lab 12/05/17 1102 12/08/17 2009 12/10/17 0354  AST 84* 204* 123*  ALT 61* 131* 103*  ALKPHOS 566* 352* 294*  BILITOT 2.7* 5.4* 4.5*  PROT 7.2 6.4* 5.3*  ALBUMIN 3.4* 2.6* 2.1*   Recent Labs  Lab 12/08/17 2147  LIPASE 19   Recent Labs  Lab 12/08/17 2327  AMMONIA 30    HbA1C: Hgb A1c MFr Bld  Date/Time Value Ref Range Status  11/08/2017 04:59 AM 4.7 (L) 4.8 - 5.6 % Final    Comment:    (NOTE) Pre diabetes:          5.7%-6.4% Diabetes:              >6.4% Glycemic control for   <7.0% adults with diabetes   07/30/2017 07:00 AM 5.6 4.8 - 5.6 % Final    Comment:    (NOTE)         Prediabetes: 5.7 - 6.4         Diabetes: >6.4         Glycemic control for adults with diabetes: <7.0     CBG: Recent Labs  Lab 12/08/17 2018  GLUCAP 158*    Recent Results (from the past 240 hour(s))  Blood Culture (routine x 2)     Status: None (Preliminary result)   Collection Time: 12/08/17  9:30 PM  Result Value Ref Range Status   Specimen Description BLOOD LEFT HAND  Final   Special Requests   Final    BOTTLES DRAWN AEROBIC AND ANAEROBIC Blood Culture adequate volume   Culture   Final    NO GROWTH 2 DAYS Performed at [facility], [place] [address] [place], [place] [address]    Report Status PENDING  Incomplete  Blood Culture (routine x 2)     Status: None (Preliminary result)   Collection Time: 12/08/17  9:45 PM  Result Value Ref Range Status   Specimen Description BLOOD LEFT HAND  Final   Special Requests   Final    BOTTLES DRAWN AEROBIC AND ANAEROBIC Blood  Culture adequate volume   Culture   Final    NO GROWTH 2 DAYS Performed at [facility], [place] [address], [place], [place] [address]    Report Status PENDING  Incomplete  MRSA PCR Screening     Status: None   Collection Time: 12/10/17  3:51 AM  Result Value Ref Range  Status   MRSA by PCR NEGATIVE NEGATIVE Final    Comment:        The GeneXpert MRSA Assay (FDA approved for NASAL specimens only), is one component of a comprehensive MRSA colonization surveillance program. It is not intended to diagnose MRSA infection nor to guide or monitor treatment for MRSA infections. Performed at [facility], [place] [address] [place], [place] [address]      Scheduled Meds: . furosemide  20 mg Intravenous Once  . iopamidol      . lipase/protease/amylase  36,000 Units Oral TID AC  . magic mouthwash  10 mL Oral TID AC & HS  . metoprolol tartrate  25 mg Oral BID  . sodium chloride flush  10-40 mL Intracatheter Q12H  . sodium chloride flush  3 mL Intravenous Q12H     LOS: 1 day   Cherene Altes, MD [facility] Office  [phone] Pager - Text Page per Shea Evans as per below:  On-Call/Text Page:      [email or web address]      password [email or web address]  If 7PM-7AM, please contact night-coverage [email or web address] Password [facility] 12/10/2017, 4:36 PM

## 2017-12-10 NOTE — Progress Notes (Signed)
ANTICOAGULATION CONSULT NOTE   Pharmacy Consult for Heparin  Indication: atrial fibrillation, thrombus on mitral valve  No Known Allergies  Patient Measurements: 72 kg  Vital Signs: Temp: 98.9 F (37.2 C) (04/29 2012) Temp Source: Oral (04/29 2012) BP: 123/83 (04/29 2053) Pulse Rate: 95 (04/29 2053)  Labs: Recent Labs    12/08/17 2009 12/09/17 0332 12/09/17 1504 12/10/17 0354 12/10/17 0426 12/10/17 1945  HGB 9.8* 8.5*  --  7.6*  --   --   HCT 30.3* 26.0*  --  23.6*  --   --   PLT 230 153  --  91*  --   --   HEPARINUNFRC  --   --  <0.10*  --  <0.10* <0.10*  CREATININE 1.03 1.05  --  0.89  --   --     Estimated Creatinine Clearance: 87.2 mL/min (by C-G formula based on SCr of 0.89 mg/dL).   Medical History: Past Medical History:  Diagnosis Date  . Acute ischemic stroke ([place])   . Goals of care, counseling/discussion 11/30/2017  . H/O mitral valve replacement with mechanical valve   . Heart disease   . High cholesterol   . History of CVA (cerebrovascular accident)    02/2011  . Hx of aortic valve replacement, mechanical   . HX: long term anticoagulant use   . Iron deficiency anemia due to chronic blood loss 12/05/2017  . Stroke [facility])     Assessment: 62 y/o M with newly dx CA just started chemo, already on Lovenox PTA for likely thrombus on mitral valve, now with new onset afib, to transition to IV heparin, last Lovenox >12 hours ago, will start heparin now. Hgb 8.5. Plts 153. Renal function good.   4/29 PM Update: heparin level remains low despite rate increased to 1500 units/hr earlier today.  Goal of Therapy:  Heparin level 0.3-0.7 units/ml Monitor platelets by anticoagulation protocol: Yes   Plan:  -Heparin 2000 units BOLUS -Inc heparin drip to 1700 units/hr -f/u AM heparin level  Manley Mason 12/10/2017,9:20 PM

## 2017-12-10 NOTE — Consult Note (Signed)
Referral MD  Reason for Referral: Metastatic pancreatic cancer; atrial fibrillation; pulmonary emboli/lower extremity DVTs  Chief Complaint  Patient presents with  . Altered Mental Status  : I just was not feeling well.  HPI: Jeff [name] is well-known to me.  Is a really nice 62 year old African-American male.  He has metastatic pancreatic cancer.  This is adenocarcinoma.  He presented with massive pulmonary emboli and lower extremity DVTs.  He was on Lovenox at home.  He was on Lovenox twice a day.  When he came in to the hospital, he was found to have hyperbilirubinemia.  He was admitted on the 28th.  When he was admitted, his procalcitonin was 6.84.  His creatinine was 1.05.  Today, his bilirubin is 4.5.  On 27 April, the bilirubin was 5.4.  He had elevated SGPT and SGOT.  These of all improved.  His blood count today shows a white cell count of 8.2.  Hemoglobin 7.6.  Platelet count 91,000.  He is due for another round of chemotherapy on Wednesday.  He is getting Abraxane/gemcitabine.  He has had no nausea or vomiting.  Is had no diarrhea.  He has had no obvious bleeding.  There is been no fever.  Actually, I think he looks fairly decent.  He is on a heart monitor.  He does show rapid atrial fibrillation.  He is on amiodarone.  I suspect that the atrial fibrillation is probably more related to the pulmonary emboli.  I probably would consider a CT angiogram on him.  He did have a CT of the abdomen and pelvis on admission.  Show that he has a new T11 compression fracture.  The liver lesions and pancreatic lesion were noted but less defined.  I probably would check his thyroid function.  Of note, we started his treatment for his pancreatic cancer, his CA 19-9 was over 770,000.    Past Medical History:  Diagnosis Date  . Acute ischemic stroke ([place])   . Goals of care, counseling/discussion 11/30/2017  . H/O mitral valve replacement with mechanical valve   . Heart disease   . High  cholesterol   . History of CVA (cerebrovascular accident)    02/2011  . Hx of aortic valve replacement, mechanical   . HX: long term anticoagulant use   . Iron deficiency anemia due to chronic blood loss 12/05/2017  . Stroke [facility])   :  Past Surgical History:  Procedure Laterality Date  . CARDIAC VALVE REPLACEMENT    . IR FLUORO GUIDE PORT INSERTION RIGHT  11/13/2017  . IR US GUIDE VASC ACCESS RIGHT  11/13/2017  . TEE WITHOUT CARDIOVERSION N/A 11/22/2017   Procedure: TRANSESOPHAGEAL ECHOCARDIOGRAM (TEE);  Surgeon: Adrian Prows, MD;  Location: [facility] ENDOSCOPY;  Service: Cardiovascular;  Laterality: N/A;  :   Current Facility-Administered Medications:  .  0.9 %  sodium chloride infusion, , Intravenous, Continuous, Cherene Altes, MD, Last Rate: 125 mL/hr at 12/09/17 2101 .  acetaminophen (TYLENOL) tablet 650 mg, 650 mg, Oral, Q6H PRN **OR** [DISCONTINUED] acetaminophen (TYLENOL) suppository 650 mg, 650 mg, Rectal, Q6H PRN, Tamala Julian, Rondell A, MD .  amiodarone (NEXTERONE PREMIX) 360-4.14 MG/200ML-% (1.8 mg/mL) IV infusion, 30 mg/hr, Intravenous, Continuous, Smith, Rondell A, MD, Last Rate: 16.7 mL/hr at 12/10/17 0654, 30 mg/hr at 12/10/17 0654 .  heparin ADULT infusion 100 units/mL (25000 units/283mL sodium chloride 0.45%), 1,500 Units/hr, Intravenous, Continuous, Erenest Blank, RPH, Last Rate: 15 mL/hr at 12/10/17 0516, 1,500 Units/hr at 12/10/17 0516 .  levalbuterol (XOPENEX) nebulizer  solution 0.63 mg, 0.63 mg, Nebulization, Q3H PRN, Cherene Altes, MD .  lipase/protease/amylase (CREON) capsule 36,000 Units, 36,000 Units, Oral, TID AC, Cherene Altes, MD .  LORazepam (ATIVAN) tablet 0.5 mg, 0.5 mg, Oral, Q6H PRN, Tamala Julian, Rondell A, MD .  metoprolol tartrate (LOPRESSOR) injection 2.5 mg, 2.5 mg, Intravenous, Q5 min PRN, Blount, Xenia T, NP, 2.5 mg at 12/10/17 0651 .  metoprolol tartrate (LOPRESSOR) tablet 25 mg, 25 mg, Oral, BID, McClung, Kimberlee Nearing, MD .  morphine 4 MG/ML injection 2-4 mg,  2-4 mg, Intravenous, Q3H PRN, Cherene Altes, MD .  ondansetron (ZOFRAN) tablet 4 mg, 4 mg, Oral, Q6H PRN **OR** ondansetron (ZOFRAN) injection 4 mg, 4 mg, Intravenous, Q6H PRN, Tamala Julian, Rondell A, MD, 4 mg at 12/09/17 1017 .  oxyCODONE (Oxy IR/ROXICODONE) immediate release tablet 5-10 mg, 5-10 mg, Oral, Q4H PRN, Cherene Altes, MD .  piperacillin-tazobactam (ZOSYN) IVPB 3.375 g, 3.375 g, Intravenous, Q8H, Smith, Rondell A, MD, Last Rate: 12.5 mL/hr at 12/10/17 0554, 3.375 g at 12/10/17 0554 .  sodium chloride flush (NS) 0.9 % injection 10-40 mL, 10-40 mL, Intracatheter, Q12H, McClung, Kimberlee Nearing, MD .  sodium chloride flush (NS) 0.9 % injection 10-40 mL, 10-40 mL, Intracatheter, PRN, Cherene Altes, MD .  sodium chloride flush (NS) 0.9 % injection 3 mL, 3 mL, Intravenous, Q12H, Smith, Rondell A, MD .  vancomycin (VANCOCIN) IVPB 1000 mg/200 mL premix, 1,000 mg, Intravenous, Q12H, Smith, Rondell A, MD, Stopped at 12/09/17 2139:  . lipase/protease/amylase  36,000 Units Oral TID AC  . metoprolol tartrate  25 mg Oral BID  . sodium chloride flush  10-40 mL Intracatheter Q12H  . sodium chloride flush  3 mL Intravenous Q12H  :  No Known Allergies:  Family History  Problem Relation Age of Onset  . Diabetes Mother        Died before her 90 birthday  . Heart attack Brother   :  Social History   Socioeconomic History  . Marital status: Single    Spouse name: Not on file  . Number of children: Not on file  . Years of education: Not on file  . Highest education level: Not on file  Occupational History  . Not on file  Social Needs  . Financial resource strain: Not on file  . Food insecurity:    Worry: Not on file    Inability: Not on file  . Transportation needs:    Medical: Not on file    Non-medical: Not on file  Tobacco Use  . Smoking status: Current Every Day Smoker    Packs/day: 0.05    Types: Cigarettes  . Smokeless tobacco: Never Used  . Tobacco comment: smoke two per  day  Substance and Sexual Activity  . Alcohol use: Yes    Comment: wine coolers prn  . Drug use: Yes    Frequency: 7.0 times per week    Types: Marijuana    Comment: smoke when he can get it  . Sexual activity: Yes  Lifestyle  . Physical activity:    Days per week: Not on file    Minutes per session: Not on file  . Stress: Not on file  Relationships  . Social connections:    Talks on phone: Not on file    Gets together: Not on file    Attends religious service: Not on file    Active member of club or organization: Not on file    Attends meetings of clubs  or organizations: Not on file    Relationship status: Not on file  . Intimate partner violence:    Fear of current or ex partner: Not on file    Emotionally abused: Not on file    Physically abused: Not on file    Forced sexual activity: Not on file  Other Topics Concern  . Not on file  Social History Narrative  . Not on file  :  Pertinent items are noted in HPI.  Exam: Patient Vitals for the past 24 hrs:  BP Temp Temp src Pulse Resp SpO2  12/10/17 0528 - - - 95 (!) 25 100 %  12/10/17 0523 100/76 - - (!) 133 (!) 26 100 %  12/10/17 0424 - 98.9 F (37.2 C) Oral - - -  12/10/17 0214 94/75 - - - (!) 24 -  12/10/17 0211 110/81 - - - (!) 25 -  12/10/17 0155 - - - - (!) 27 -  12/10/17 0133 - - - - (!) 26 -  12/10/17 0130 - - - - (!) 27 -  12/10/17 0127 - - - - 20 -  12/10/17 0126 - - - - (!) 28 -  12/10/17 0125 - - - - (!) 28 -  12/10/17 0123 99/76 - - - (!) 34 -  12/10/17 0007 - 98 F (36.7 C) Oral - - -  12/09/17 2200 123/81 - - - (!) 25 -  12/09/17 2135 107/80 - - - (!) 34 -  12/09/17 2110 (!) 121/97 - - - (!) 22 -  12/09/17 2045 108/85 - - - (!) 32 -  12/09/17 1816 (!) 115/95 - - (!) 151 (!) 28 99 %  12/09/17 1753 (!) 115/92 - - (!) 33 (!) 21 94 %  12/09/17 1715 (!) 104/91 - - 72 (!) 31 100 %  12/09/17 1700 (!) 121/91 - - 70 (!) 33 100 %  12/09/17 1630 (!) 121/92 - - - (!) 31 -  12/09/17 1615 108/86 - - 72  (!) 33 100 %  12/09/17 1600 (!) 122/112 - - (!) 149 (!) 37 100 %  12/09/17 1545 120/85 - - (!) 48 (!) 25 100 %  12/09/17 1530 109/79 - - (!) 150 (!) 24 100 %  12/09/17 1445 103/85 - - 86 (!) 28 100 %  12/09/17 1430 115/89 - - (!) 145 (!) 27 100 %  12/09/17 1415 105/85 - - (!) 149 (!) 30 100 %  12/09/17 1400 (!) 104/92 - - (!) 155 (!) 29 100 %  12/09/17 1345 (!) 112/92 - - (!) 153 (!) 25 100 %  12/09/17 1330 104/89 - - (!) 152 (!) 35 100 %  12/09/17 1315 126/83 - - (!) 160 (!) 28 100 %  12/09/17 1300 112/86 - - - (!) 28 -  12/09/17 1245 106/88 - - - (!) 28 -  12/09/17 1230 112/89 - - - (!) 29 -  12/09/17 1215 122/88 - - - (!) 33 -  12/09/17 1200 123/89 - - - (!) 28 -  12/09/17 1130 132/90 - - - (!) 26 -  12/09/17 1115 (!) 119/92 - - - (!) 23 -  12/09/17 1100 108/76 - - - (!) 30 -  12/09/17 1045 100/80 - - - (!) 30 -  12/09/17 1015 (!) 115/93 - - (!) 145 (!) 34 100 %  12/09/17 1000 117/89 - - (!) 140 (!) 21 100 %  12/09/17 0945 99/79 - - - (!) 28 -  12/09/17 0915  108/85 - - - (!) 25 -  12/09/17 0900 112/84 - - 76 (!) 26 100 %  12/09/17 0845 108/71 - - - (!) 30 -  12/09/17 0830 108/81 - - - (!) 25 -  12/09/17 0815 112/75 - - (!) 138 (!) 30 100 %  12/09/17 0800 104/78 - - (!) 36 (!) 29 (!) 89 %  12/09/17 0745 95/81 - - 79 (!) 28 100 %  12/09/17 0730 104/81 - - - (!) 32 -  12/09/17 0715 103/75 - - - (!) 25 -  12/09/17 0700 109/81 - - - (!) 26 -     Recent Labs    12/09/17 0332 12/10/17 0354  WBC 11.5* 8.2  HGB 8.5* 7.6*  HCT 26.0* 23.6*  PLT 153 91*   Recent Labs    12/09/17 0332 12/10/17 0354  NA 138 139  K 4.7 4.1  CL 106 108  CO2 24 24  GLUCOSE 159* 152*  BUN 29* 25*  CREATININE 1.05 0.89  CALCIUM 8.2* 7.9*    Blood smear review: None  Pathology: None    Assessment and Plan: Mr. Daleo is a 62 year old African-American male.  He has metastatic adenocarcinoma of the pancreas.  He has pulmonary emboli and lower extremity DVTs.  He now has atrial  fibrillation.  Again, I think this is more related to his emboli then to any cardiac issue.  He has had echocardiograms in the past.  I do not think he has hyperthyroidism.  However, I think it would be worthwhile checking this.  I think a CT angiogram of the chest would also be helpful.  I would give him 2 units of blood.  I think this would be helpful.  I talked him about this today.  He agrees.  I explained him what I thought the blood transfusion would help.  I think we can take some stress off his heart, this will help with a atrial fibrillation.  I appreciate everybody's help on 5 W.  It is apparent that he is getting fantastic care from all the staff up on 5 W.  Jeff Haw, MD  [address] [place] 3:9

## 2017-12-10 NOTE — Progress Notes (Signed)
ANTICOAGULATION CONSULT NOTE   Pharmacy Consult for Heparin  Indication: atrial fibrillation, thrombus on mitral valve  No Known Allergies  Patient Measurements: 72 kg  Vital Signs: Temp: 98.9 F (37.2 C) (04/29 0424) Temp Source: Oral (04/29 0424) BP: 94/75 (04/29 0214) Pulse Rate: 151 (04/28 1816)  Labs: Recent Labs    12/08/17 2009 12/09/17 0332 12/09/17 1504 12/10/17 0354 12/10/17 0426  HGB 9.8* 8.5*  --  7.6*  --   HCT 30.3* 26.0*  --  23.6*  --   PLT 230 153  --  PENDING  --   HEPARINUNFRC  --   --  <0.10*  --  <0.10*  CREATININE 1.03 1.05  --  0.89  --     Estimated Creatinine Clearance: 87.2 mL/min (by C-G formula based on SCr of 0.89 mg/dL).   Medical History: Past Medical History:  Diagnosis Date  . Acute ischemic stroke ([place])   . Goals of care, counseling/discussion 11/30/2017  . H/O mitral valve replacement with mechanical valve   . Heart disease   . High cholesterol   . History of CVA (cerebrovascular accident)    02/2011  . Hx of aortic valve replacement, mechanical   . HX: long term anticoagulant use   . Iron deficiency anemia due to chronic blood loss 12/05/2017  . Stroke [facility])     Assessment: 62 y/o M with newly dx CA just started chemo, already on Lovenox PTA for likely thrombus on mitral valve, now with new onset afib, to transition to IV heparin, last Lovenox >12 hours ago, will start heparin now. Hgb 8.5. Plts 153. Renal function good.   4/29 Update: heparin level remains low despite rate increase  Goal of Therapy:  Heparin level 0.3-0.7 units/ml Monitor platelets by anticoagulation protocol: Yes   Plan:  -Heparin 2000 units BOLUS -Inc heparin drip to 1500 units/hr -1330 HL  Narda Bonds 12/10/2017,5:06 AM

## 2017-12-11 ENCOUNTER — Encounter (HOSPITAL_COMMUNITY): Payer: Self-pay | Admitting: General Practice

## 2017-12-11 ENCOUNTER — Other Ambulatory Visit: Payer: Self-pay

## 2017-12-11 DIAGNOSIS — R509 Fever, unspecified: Secondary | ICD-10-CM

## 2017-12-11 DIAGNOSIS — D696 Thrombocytopenia, unspecified: Secondary | ICD-10-CM

## 2017-12-11 DIAGNOSIS — R112 Nausea with vomiting, unspecified: Secondary | ICD-10-CM

## 2017-12-11 LAB — COMPREHENSIVE METABOLIC PANEL
ALT: 103 U/L — AB (ref 17–63)
AST: 110 U/L — AB (ref 15–41)
Albumin: 2 g/dL — ABNORMAL LOW (ref 3.5–5.0)
Alkaline Phosphatase: 406 U/L — ABNORMAL HIGH (ref 38–126)
Anion gap: 8 (ref 5–15)
BILIRUBIN TOTAL: 6 mg/dL — AB (ref 0.3–1.2)
BUN: 24 mg/dL — ABNORMAL HIGH (ref 6–20)
CALCIUM: 7.9 mg/dL — AB (ref 8.9–10.3)
CO2: 25 mmol/L (ref 22–32)
CREATININE: 1.01 mg/dL (ref 0.61–1.24)
Chloride: 107 mmol/L (ref 101–111)
Glucose, Bld: 123 mg/dL — ABNORMAL HIGH (ref 65–99)
Potassium: 3.4 mmol/L — ABNORMAL LOW (ref 3.5–5.1)
Sodium: 140 mmol/L (ref 135–145)
TOTAL PROTEIN: 5.1 g/dL — AB (ref 6.5–8.1)

## 2017-12-11 LAB — BPAM RBC
BLOOD PRODUCT EXPIRATION DATE: 201905212359
Blood Product Expiration Date: 201905212359
ISSUE DATE / TIME: 201904291519
ISSUE DATE / TIME: 201904291049
UNIT TYPE AND RH: 7300
Unit Type and Rh: 7300

## 2017-12-11 LAB — CBC
HCT: 28.6 % — ABNORMAL LOW (ref 39.0–52.0)
HEMOGLOBIN: 9.5 g/dL — AB (ref 13.0–17.0)
MCH: 28.5 pg (ref 26.0–34.0)
MCHC: 33.2 g/dL (ref 30.0–36.0)
MCV: 85.9 fL (ref 78.0–100.0)
Platelets: 39 10*3/uL — ABNORMAL LOW (ref 150–400)
RBC: 3.33 MIL/uL — AB (ref 4.22–5.81)
RDW: 16.1 % — ABNORMAL HIGH (ref 11.5–15.5)
WBC: 11 10*3/uL — ABNORMAL HIGH (ref 4.0–10.5)

## 2017-12-11 LAB — TYPE AND SCREEN
ABO/RH(D): B POS
Antibody Screen: NEGATIVE
UNIT DIVISION: 0
Unit division: 0

## 2017-12-11 LAB — PROCALCITONIN: PROCALCITONIN: 4.36 ng/mL

## 2017-12-11 LAB — T4: T4, Total: 8 ug/dL (ref 4.5–12.0)

## 2017-12-11 LAB — HEPARIN LEVEL (UNFRACTIONATED): HEPARIN UNFRACTIONATED: 0.13 [IU]/mL — AB (ref 0.30–0.70)

## 2017-12-11 MED ORDER — AMIODARONE HCL 200 MG PO TABS
200.0000 mg | ORAL_TABLET | Freq: Two times a day (BID) | ORAL | Status: DC
  Administered 2017-12-11 – 2017-12-13 (×5): 200 mg via ORAL
  Filled 2017-12-11 (×5): qty 1

## 2017-12-11 MED ORDER — ENOXAPARIN SODIUM 80 MG/0.8ML ~~LOC~~ SOLN
1.0000 mg/kg | Freq: Once | SUBCUTANEOUS | Status: AC
  Administered 2017-12-12: 75 mg via SUBCUTANEOUS
  Filled 2017-12-11: qty 0.8

## 2017-12-11 MED ORDER — ENOXAPARIN SODIUM 80 MG/0.8ML ~~LOC~~ SOLN
1.0000 mg/kg | Freq: Once | SUBCUTANEOUS | Status: AC
  Administered 2017-12-11: 75 mg via SUBCUTANEOUS
  Filled 2017-12-11: qty 0.8

## 2017-12-11 MED ORDER — POTASSIUM CHLORIDE CRYS ER 20 MEQ PO TBCR
40.0000 meq | EXTENDED_RELEASE_TABLET | Freq: Once | ORAL | Status: AC
  Administered 2017-12-11: 40 meq via ORAL
  Filled 2017-12-11: qty 2

## 2017-12-11 MED ORDER — ENOXAPARIN SODIUM 80 MG/0.8ML ~~LOC~~ SOLN
1.0000 mg/kg | Freq: Two times a day (BID) | SUBCUTANEOUS | Status: DC
  Administered 2017-12-13: 75 mg via SUBCUTANEOUS
  Filled 2017-12-11: qty 0.8

## 2017-12-11 NOTE — Progress Notes (Signed)
Jeff [name] seemed to have a tough day yesterday.  He was just in a very "rare mood".  He really did not want to talk to many people.  He did have his blood transfusion.  His CBC is still pending today.  He did have a CT angiogram done yesterday.  This showed improvement in the clot burden within the left lung in the right lower lobe.  However, there is a new embolus in the right upper lobe.  He is on his heparin drip.  Hopefully he can be converted over to Lovenox.  His appetite is marginal.  He has had a little bit of nausea and episode of emesis yesterday.  He has had no obvious bleeding.  He is says his pain is under fairly good control.  I think he really hates being on all these infusions.  His heart rate is doing much better.  His A. fib is under much better control.  I think that the antibiotics can probably be stopped.  I do not think he has an infection.  I have to believe that this rapid atrial fibrillation is more a reflection of the clot burden in his lungs and possibly his malignancy.  Maybe he can be switched over to Lovenox today.  I am not sure how active he really is.  He has been relatively afebrile.  His temperature this morning was 99.3.  On his physical exam, I really cannot find anything new.  Hopefully, his treatments can be narrowed down a little bit just for his peace of mind and his "sanity."  Lattie Haw, MD  [place] 4:1

## 2017-12-11 NOTE — Progress Notes (Signed)
[facility] TEAM 1 - Stepdown/ICU TEAM  Jeff [name]  ZOX:[id number] DOB: [birth year] DOA: 12/08/2017 PCP: Kerin Perna, NP    Brief Narrative:  62yo M w/ a hx of HTN, A. fib, CVA,  S/p mechanical AV & MV on chronic anticoagulation, DVT, and metastatic pancreatic cancer followed by Dr. Marin Olp who presented after being found to be progressively more altered since initiating chemotherapy on 12/05/2017.  Associated symptoms included cough, vomiting, diarrhea, worsening abdominal pain, and poor p.o. intake.   In the ED the patient was found to be in A. fib with RVR with heart rate up to 162 bpm.  He also met sepsis criteria.  Significant Events: 4/27 admit  4/30 2U PRBC   Subjective: The patient is in much better spirits today.  He was seen by his oncologist and cardiologist today along with palliative care.  At the time of visit he denies chest pain shortness breath fevers or chills..   Assessment & Plan:  SIRS v/s Sepsis, unknown origin tachycardic with WBC 16.7 and lactic acid 3.84 - CXR unrevealing - previously noted mitral valve thrombus versus vegetation w/ endocarditis a possible cause - doubt a true infectious source and suspect more related to new PE and Afib as well as DH - stop abx and follow - SIRS physiology has resolved   Acute encephalopathy/delirium v/s progressive dementia CT head without contrast 4/28 noted diffuse atrophy but no acute findings - check W-11 and folic acid levels - may need to consider MRI or contrast CT of the head - mental status appears improved today  Transaminitis  Likely combination of shock liver plus liver metastasis - stable/slightly improved   A. 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 Marin Olp - received his first round of chemotherapy on 4/24 - due for his next dose on 5/1   Extensive B PE April 2019 - L LE DVT (initial timing unclear) CT angio this admit notes overall decrease in clot burden, but new clot in the R apical territory - continue anticoagulation w/ Lovenox  CVA Dec 2018  DVT prophylaxis: lovenox   Code Status: FULL CODE Family Communication: no family present at time of exam  Disposition Plan: tele bed   Consultants:  PCCM Cardiology   Antimicrobials:  Zosyn 4/27 > 4/29 Vanc 4/27 > 4/29  Objective: Blood pressure 120/87, pulse 76, temperature 99.3 F (37.4 C), temperature source Oral, resp. rate 17, weight 74.2 kg (163 lb 9.3 oz), SpO2 96 %.  Intake/Output Summary (Last 24 hours) at 12/11/2017 1529 Last data filed at 12/11/2017 1047 Gross per 24 hour  Intake 2476.99 ml  Output 1260 ml  Net 1216.99 ml   Filed Weights   12/11/17 0548  Weight: 74.2 kg (163 lb 9.3  oz)    Examination: General: No acute respiratory distress at rest  Lungs: CTA B - no wheezing  Cardiovascular: Regular rate w/o rub  Abdomen: NT/ND, soft, bs+ Extremities: No significant E B LE    CBC: Recent Labs  Lab 12/05/17 1102 12/08/17 2009 12/09/17 0332 12/10/17 0354  WBC 15.9* 16.7* 11.5* 8.2  NEUTROABS 12.4*  --   --  7.7  HGB 10.8* 9.8* 8.5* 7.6*  HCT 33.6* 30.3* 26.0* 23.6*  MCV 95.2 91.0 90.9 89.4  PLT 429* 230 153 91*   Basic Metabolic Panel: Recent Labs  Lab  12/09/17 0332 12/09/17 0652 12/10/17 0354 12/11/17 0438  NA 138  --  139 140  K 4.7  --  4.1 3.4*  CL 106  --  108 107  CO2 24  --  24 25  GLUCOSE 159*  --  152* 123*  BUN 29*  --  25* 24*  CREATININE 1.05  --  0.89 1.01  CALCIUM 8.2*  --  7.9* 7.9*  MG  --  2.0 1.8  --   PHOS  --  3.0 2.3*  --    GFR: Estimated Creatinine Clearance: 78.6 mL/min (by C-G formula based on SCr of 1.01 mg/dL).  Liver Function Tests: Recent Labs  Lab 12/05/17 1102 12/08/17 2009 12/10/17 0354 12/11/17 0438  AST 84* 204* 123* 110*  ALT 61* 131* 103* 103*  ALKPHOS 566* 352* 294* 406*  BILITOT 2.7* 5.4* 4.5* 6.0*  PROT 7.2 6.4* 5.3* 5.1*  ALBUMIN 3.4* 2.6* 2.1* 2.0*   Recent Labs  Lab 12/08/17 2147  LIPASE 19   Recent Labs  Lab 12/08/17 2327  AMMONIA 30    HbA1C: Hgb A1c MFr Bld  Date/Time Value Ref Range Status  11/08/2017 04:59 AM 4.7 (L) 4.8 - 5.6 % Final    Comment:    (NOTE) Pre diabetes:          5.7%-6.4% Diabetes:              >6.4% Glycemic control for   <7.0% adults with diabetes   07/30/2017 07:00 AM 5.6 4.8 - 5.6 % Final    Comment:    (NOTE)         Prediabetes: 5.7 - 6.4         Diabetes: >6.4         Glycemic control for adults with diabetes: <7.0     CBG: Recent Labs  Lab 12/08/17 2018  GLUCAP 158*    Recent Results (from the past 240 hour(s))  Blood Culture (routine x 2)     Status: None (Preliminary result)   Collection Time: 12/08/17  9:30 PM  Result Value Ref Range Status   Specimen Description BLOOD LEFT HAND  Final   Special Requests   Final    BOTTLES DRAWN AEROBIC AND ANAEROBIC Blood Culture adequate volume   Culture   Final    NO GROWTH 3 DAYS Performed at [facility], [place] [address] [place], [place] [address]    Report Status PENDING  Incomplete  Blood Culture (routine x 2)     Status: None (Preliminary result)   Collection Time: 12/08/17  9:45 PM  Result Value Ref Range Status   Specimen Description BLOOD LEFT HAND  Final     Special Requests   Final    BOTTLES DRAWN AEROBIC AND ANAEROBIC Blood Culture adequate volume   Culture   Final    NO GROWTH 3 DAYS Performed at [facility]  Lab, [address], [place], [place] [address]    Report Status PENDING  Incomplete  MRSA PCR Screening     Status: None   Collection Time: 12/10/17  3:51 AM  Result Value Ref Range Status   MRSA by PCR NEGATIVE NEGATIVE Final    Comment:        The GeneXpert MRSA Assay (FDA approved for NASAL specimens only), is one component of a comprehensive MRSA colonization surveillance program. It is not intended to diagnose MRSA infection nor to guide or monitor treatment for MRSA infections. Performed at [facility], [place] [address], [place], [place] [address]      Scheduled Meds: . amiodarone  200 mg Oral BID  . [START ON 12/12/2017] enoxaparin (LOVENOX) injection  1 mg/kg Subcutaneous Once   Followed by  . [START ON 12/12/2017] enoxaparin (LOVENOX) injection  1 mg/kg Subcutaneous Once   Followed by  . [START ON 12/13/2017] enoxaparin (LOVENOX) injection  1 mg/kg Subcutaneous Q12H  . lipase/protease/amylase  36,000 Units Oral TID AC  . magic mouthwash  10 mL Oral TID AC & HS  . metoprolol tartrate  25 mg Oral BID  . sodium chloride flush  10-40 mL Intracatheter Q12H     LOS: 2 days   Cherene Altes, MD [facility] Office  [phone] Pager - Text Page per Shea Evans as per below:  On-Call/Text Page:      [email or web address]      password [email or web address]  If 7PM-7AM, please contact night-coverage [email or web address] Password [employer] 12/11/2017, 3:29 PM

## 2017-12-11 NOTE — Progress Notes (Signed)
ANTICOAGULATION CONSULT NOTE   Pharmacy Consult for Heparin  Indication: atrial fibrillation, thrombus on mitral valve  No Known Allergies  Patient Measurements: 72 kg  Vital Signs: Temp: 99.2 F (37.3 C) (04/30 0422) Temp Source: Oral (04/30 0422) BP: 121/82 (04/30 0422) Pulse Rate: 84 (04/30 0422)  Labs: Recent Labs    12/08/17 2009 12/09/17 0332  12/10/17 0354 12/10/17 0426 12/10/17 1945 12/11/17 0438  HGB 9.8* 8.5*  --  7.6*  --   --   --   HCT 30.3* 26.0*  --  23.6*  --   --   --   PLT 230 153  --  91*  --   --   --   HEPARINUNFRC  --   --    < >  --  <0.10* <0.10* 0.13*  CREATININE 1.03 1.05  --  0.89  --   --  1.01   < > = values in this interval not displayed.    Estimated Creatinine Clearance: 78.6 mL/min (by C-G formula based on SCr of 1.01 mg/dL).   Medical History: Past Medical History:  Diagnosis Date  . Acute ischemic stroke ([facility])   . Goals of care, counseling/discussion 11/30/2017  . H/O mitral valve replacement with mechanical valve   . Heart disease   . High cholesterol   . History of CVA (cerebrovascular accident)    02/2011  . Hx of aortic valve replacement, mechanical   . HX: long term anticoagulant use   . Iron deficiency anemia due to chronic blood loss 12/05/2017  . Stroke [facility])     Assessment: 62 y/o M with newly dx CA just started chemo, already on Lovenox PTA for likely thrombus on mitral valve, now with new onset afib, to transition to IV heparin, last Lovenox >12 hours ago, will start heparin now. Hgb 7.6 yesterday. Plts 91 yesterday.   4/30 Update: heparin level remains low but starting to trend up, CBC was ordered but now drawn, will order one with next heparin level at 1400  Goal of Therapy:  Heparin level 0.3-0.7 units/ml Monitor platelets by anticoagulation protocol: Yes   Plan:  Inc heparin to 1900 units/hr 1400 heparin level and CBC Trend Hgb/plts  Narda Bonds 12/11/2017,6:23 AM

## 2017-12-11 NOTE — Progress Notes (Signed)
ANTICOAGULATION CONSULT NOTE - Follow Up Consult  Pharmacy Consult for Lovenox Indication: atrial fibrillation, pulmonary embolus and mechanical MVR with thrombus  No Known Allergies  Patient Measurements: Weight: 163 lb 9.3 oz (74.2 kg)  Vital Signs: Temp: 99.3 F (37.4 C) (04/30 1230) Temp Source: Oral (04/30 1230) BP: 116/87 (04/30 0921) Pulse Rate: 88 (04/30 0921)  Labs: Recent Labs    12/08/17 2009 12/09/17 0332  12/10/17 0354 12/10/17 0426 12/10/17 1945 12/11/17 0438  HGB 9.8* 8.5*  --  7.6*  --   --   --   HCT 30.3* 26.0*  --  23.6*  --   --   --   PLT 230 153  --  91*  --   --   --   HEPARINUNFRC  --   --    < >  --  <0.10* <0.10* 0.13*  CREATININE 1.03 1.05  --  0.89  --   --  1.01   < > = values in this interval not displayed.    Estimated Creatinine Clearance: 78.6 mL/min (by C-G formula based on SCr of 1.01 mg/dL).   Assessment: 62 y/o male with newly diagnosed metastatic pancreatic cancer on Lovenox PTA for mechanical MVR with thrombus, recent DVT/PE (11/13/17), now with new Afib and new PE in RUL. Pharmacy consulted to transition from IV heparin back to Lovenox. Heparin level has not been therapeutic since started therefore will give first Lovenox dose now. No bleeding noted, Hgb low at 7.6 yesterday and received PRBC, platelets 230 >> 91 and likely chemo related. CBC pending for today.  Goal of Therapy:  Anti-Xa level 0.6-1 units/ml 4hrs after LMWH dose given Monitor platelets by anticoagulation protocol: Yes   Plan:  Discontinue heparin drip Lovenox 75 mg SQ q12h CBC q72h while on Lovenox Monitor for s/sx of bleeding   Renold Genta, PharmD, BCPS Clinical Pharmacist Clinical phone for 12/11/2017 until 4p is [phone] After 4p, please call Main Rx at [phone] for assistance 12/11/2017 1:52 PM

## 2017-12-11 NOTE — Progress Notes (Signed)
Pharmacy Antibiotic Note  Jeff [name] is a 62 y.o. male admitted on 12/08/2017 with sepsis.  Pharmacy has been consulted for vancomycin and Zosyn dosing. Per [facility] note, plan is to stop antibiotics after 3 days and follow.  Renal function is stable. Cultures ngtd. Afebrile, WBC normal.  Plan: Vancomycin 1g IV q12h Zosyn 3.375g IV q8h EI Follow c/s, clinical progression, renal function, level PRN, de-escalation/LOT  Weight: 163 lb 9.3 oz (74.2 kg)  Temp (24hrs), Avg:98.7 F (37.1 C), Min:97.7 F (36.5 C), Max:99.2 F (37.3 C)  Recent Labs  Lab 12/05/17 1102 12/08/17 2009  12/08/17 2345 12/09/17 0332 12/09/17 0457 12/09/17 0652 12/09/17 1504 12/10/17 0354 12/11/17 0438  WBC 15.9* 16.7*  --   --  11.5*  --   --   --  8.2  --   CREATININE 1.30* 1.03  --   --  1.05  --   --   --  0.89 1.01  LATICACIDVEN  --   --    < > 3.84* 2.2* 1.8 1.9 1.9  --   --    < > = values in this interval not displayed.    Estimated Creatinine Clearance: 78.6 mL/min (by C-G formula based on SCr of 1.01 mg/dL).    No Known Allergies  Antimicrobials this admission: Vancomycin 4/27 >>  Zosyn 4/27 >>   Dose adjustments this admission: n/a  Microbiology results: 4/27 BCx: ngtd  Thank you for allowing pharmacy to be a part of this patient's care.   Renold Genta, PharmD, BCPS Clinical Pharmacist Clinical phone for 12/11/2017 until 4p is [phone] After 4p, please call Main Rx at [phone] for assistance 12/11/2017 10:48 AM

## 2017-12-11 NOTE — Consult Note (Signed)
CARDIOLOGY CONSULT NOTE  Patient ID: Jeff [name] MRN: [id number] DOB/AGE: 1956-05-21 62 y.o.  Admit date: 12/08/2017 Referring Physician  Malen Gauze, MD Primary Physician:  Kerin Perna, NP Reason for Consultation  A. Flutter  HPI: 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  He was afebrile and there was no signs of any sepsis at that time.  He was doing well until 12/08/2017 when he was brought in due to altered mental status.  Patient was found to be in atypical atrial flutter with RVR upon admission due to this I was consulted for management of arrhythmias.  After TEE, no cardioversion was performed due to suspicion for possibility of thrombus and hypercoagulable state, but was started on beta-blocker and calcium channel blocker combination along with addition of digoxin for rate control.  This morning he is alert and oriented, states that he is feeling well and except for lack of appetite no other specific complaints.  He also continues to feel generally weak.  No nausea or vomiting.  No fever, no chills.  Denies any urinary or bowel disturbances.  Past Medical History:  Diagnosis Date  . Acute ischemic stroke ([place])   . Goals of care, counseling/discussion 11/30/2017  . H/O mitral valve replacement with mechanical valve   . Heart disease   . High cholesterol   . History of CVA (cerebrovascular accident)    02/2011  . Hx of aortic valve replacement, mechanical   . HX: long term anticoagulant use   . Iron deficiency anemia due to chronic blood loss 12/05/2017  . Stroke [facility])       Past Surgical History:  Procedure Laterality Date  . CARDIAC VALVE REPLACEMENT    . IR FLUORO GUIDE PORT INSERTION RIGHT  11/13/2017  . IR US GUIDE VASC ACCESS RIGHT  11/13/2017  . TEE WITHOUT CARDIOVERSION N/A 11/22/2017   Procedure: TRANSESOPHAGEAL ECHOCARDIOGRAM (TEE);  Surgeon: Adrian Prows, MD;  Location: [facility] ENDOSCOPY;  Service: Cardiovascular;  Laterality: N/A;     Family History  Problem Relation Age of Onset  . Diabetes Mother        Died before her 37 birthday  . Heart attack Brother      Social History: Social History   Socioeconomic History  . Marital status: Single    Spouse name: Not on file  . Number of children: Not on file  . Years of education: Not on file  . Highest education level: Not on file  Occupational History  . Not on file  Social Needs  . Financial resource strain: Not on file  . Food insecurity:    Worry: Not on file    Inability: Not on file  . Transportation needs:    Medical: Not on file    Non-medical: Not on file  Tobacco Use  . Smoking status: Current Every Day Smoker    Packs/day: 0.05    Types: Cigarettes  . Smokeless tobacco: Never Used  . Tobacco comment: smoke two per day  Substance and Sexual Activity  . Alcohol use: Yes    Comment: wine coolers prn  . Drug use: Yes    Frequency: 7.0 times per  week    Types: Marijuana    Comment: smoke when he can get it  . Sexual activity: Yes  Lifestyle  . Physical activity:    Days per week: Not on file    Minutes per session: Not on file  . Stress: Not on file  Relationships  . Social connections:    Talks on phone: Not on file    Gets together: Not on file    Attends religious service: Not on file    Active member of club or organization: Not on file    Attends meetings of clubs or organizations: Not on file    Relationship status: Not on file  . Intimate partner violence:    Fear of current or ex partner: Not on file    Emotionally abused: Not on file    Physically abused: Not  on file    Forced sexual activity: Not on file  Other Topics Concern  . Not on file  Social History Narrative  . Not on file     Medications Prior to Admission  Medication Sig Dispense Refill Last Dose  . aspirin EC 81 MG EC tablet Take 1 tablet (81 mg total) by mouth daily.   12/08/2017 at unk  . diltiazem (CARDIZEM CD) 180 MG 24 hr capsule Take 1 capsule (180 mg total) by mouth daily. 30 capsule 1 12/08/2017 at Unknown time  . dronabinol (MARINOL) 5 MG capsule Take 1 capsule (5 mg total) by mouth 2 (two) times daily before lunch and supper. 60 capsule 0 12/08/2017 at am  . enoxaparin (LOVENOX) 80 MG/0.8ML injection Inject 0.75 mLs (75 mg total) into the skin every 12 (twelve) hours. 60 Syringe 2 12/08/2017 at Unknown time  . lidocaine-prilocaine (EMLA) cream Apply 1 application topically as needed. (Patient taking differently: Apply 1 application topically See admin instructions. Pt takes Before chemotherapy - daily x14 days, then off x14 days) 30 g 6 12/08/2017 at Unknown time  . lidocaine-prilocaine (EMLA) cream Apply to affected area once 30 g 3 12/08/2017 at Unknown time  . lipase/protease/amylase (CREON) 36000 UNITS CPEP capsule Take 1 capsule (36,000 Units total) by mouth 3 (three) times daily before meals. 270 capsule 1 12/08/2017 at Unknown time  . metoprolol tartrate (LOPRESSOR) 25 MG tablet Take 1 tablet (25 mg total) by mouth 2 (two) times daily. 60 tablet 1 12/08/2017 at 0800  . ondansetron (ZOFRAN) 8 MG tablet Take 1 tablet (8 mg total) by mouth 2 (two) times daily. (Patient taking differently: Take 8 mg by mouth every 8 (eight) hours as needed. ) 30 tablet 1 prn at prn  . oxyCODONE 10 MG TABS Take 1 tablet (10 mg total) by mouth every 6 (six) hours as needed for severe pain. 90 tablet 0 prn at prn  . prochlorperazine (COMPAZINE) 10 MG tablet Take 1 tablet (10 mg total) by mouth every 6 (six) hours as needed for nausea or vomiting. 30 tablet 1 prn at prn  . LORazepam (ATIVAN) 0.5 MG  tablet Take 1 tablet (0.5 mg total) by mouth every 6 (six) hours as needed (Nausea or vomiting). 30 tablet 0 prn at prn  . oxyCODONE (OXYCONTIN) 15 mg 12 hr tablet Take 1 tablet (15 mg total) by mouth every 12 (twelve) hours. (Patient not taking: Reported on 12/09/2017) 60 tablet 0 Not Taking at Unknown time  . oxyCODONE (ROXICODONE) 5 MG immediate release tablet Take 1 tablet (5 mg total) by mouth every 8 (eight) hours as needed for up to 30  doses. (Patient not taking: Reported on 12/09/2017) 30 tablet 0 Not Taking at Unknown time   Review of Systems  Constitutional: Positive for chills, fever, malaise/fatigue and weight loss. Negative for diaphoresis.  HENT: Negative.   Eyes: Negative.   Respiratory: Negative.   Cardiovascular: Negative.   Gastrointestinal: Positive for abdominal pain (improved on medications).  Genitourinary: Negative.   Musculoskeletal: Negative.   Skin: Negative.   Neurological: Negative for seizures, loss of consciousness and weakness.   Physical Exam: Blood pressure 116/87, pulse 88, temperature 99.2 F (37.3 C), temperature source Oral, resp. rate (!) 26, weight 74.2 kg (163 lb 9.3 oz), SpO2 94 %.   Physical Exam  Constitutional: He is oriented to person, place, and time. He appears well-developed and well-nourished. No distress.  Appears ill  HENT:  Head: Atraumatic.  Eyes: Scleral icterus is present.  Neck: Neck supple. No JVD present. No tracheal deviation present.  Cardiovascular:  Crisp mechanical S1 and S2 heard.  No murmur appreciated.  Occasional ectopy.  Pulmonary/Chest: Effort normal and breath sounds normal. No stridor.  Abdominal: Soft. Bowel sounds are normal. He exhibits distension (Ascites present).  Musculoskeletal: Normal range of motion. He exhibits no edema.  Neurological: He is alert and oriented to person, place, and time.  Skin: Skin is warm and dry.  Psychiatric: He has a normal mood and affect.   Labs:   Lab Results  Component  Value Date   WBC 8.2 12/10/2017   HGB 7.6 (L) 12/10/2017   HCT 23.6 (L) 12/10/2017   MCV 89.4 12/10/2017   PLT 91 (L) 12/10/2017    Recent Labs  Lab 12/11/17 0438  NA 140  K 3.4*  CL 107  CO2 25  BUN 24*  CREATININE 1.01  CALCIUM 7.9*  PROT 5.1*  BILITOT 6.0*  ALKPHOS 406*  ALT 103*  AST 110*  GLUCOSE 123*    Lipid Panel     Component Value Date/Time   CHOL 163 07/30/2017 0700   TRIG 50 07/30/2017 0700   HDL 49 07/30/2017 0700   CHOLHDL 3.3 07/30/2017 0700   VLDL 10 07/30/2017 0700   LDLCALC 104 (H) 07/30/2017 0700   HEMOGLOBIN A1C Lab Results  Component Value Date   HGBA1C 4.7 (L) 11/08/2017   MPG 88.19 11/08/2017    Lab Results  Component Value Date   CKTOTAL 93 02/16/2011   CKMB 2.7 02/16/2011   TROPONINI <0.30 07/11/2013     TSH Recent Labs    12/10/17 0759  TSH 1.447     Radiology: Ct Abdomen Pelvis Wo Contrast  Result Date: 12/09/2017 CLINICAL DATA:  Abdominal distension EXAM: CT ABDOMEN AND PELVIS WITHOUT CONTRAST TECHNIQUE: Multidetector CT imaging of the abdomen and pelvis was performed following the standard protocol without IV contrast. COMPARISON:  11/07/2017 FINDINGS: Lower chest: Small bilateral pleural effusions. Associated dependent atelectasis. Hepatobiliary: Multiple liver lesions are poorly defined on a noncontrast study. There is edema and thickening of the gallbladder wall. There is a small amount of free fluid about the liver. Pancreas: Mass in the tail of the pancreas is poorly defined. Spleen: Unremarkable. Adrenals/Urinary Tract: No hydronephrosis. Kidneys are within normal limits. Adrenal glands are within normal limits. Bladder is unremarkable. Stomach/Bowel: There is no disproportionate dilatation of bowel to suggest obstruction. There is no pneumatosis. Stomach is decompressed. Normal appendix in the right lower quadrant. Vascular/Lymphatic: Scattered small para-aortic lymph nodes. Atherosclerotic calcifications of the aorta. No  evidence of aortic aneurysm. Reproductive: Prostate is within normal limits. Other: There is  free fluid within the pelvis as well as in both paracolic gutters. Musculoskeletal: There is a new T11 inferior endplate compression fracture. Pathologic fracture is not excluded. IMPRESSION: New T11 compression fracture. Small bilateral pleural effusions. Ascites. Liver lesions and pancreatic tail mass are again noted but less defined than on the prior study Gallbladder wall thickening and edema is nonspecific. No evidence of bowel obstruction. Electronically Signed   By: Marybelle Killings M.D.   On: 12/09/2017 12:09   Ct Head Wo Contrast  Result Date: 12/09/2017 CLINICAL DATA:  Altered level of consciousness EXAM: CT HEAD WITHOUT CONTRAST TECHNIQUE: Contiguous axial images were obtained from the base of the skull through the vertex without intravenous contrast. COMPARISON:  07/29/2017 FINDINGS: Brain: Chronic infarcts in both cerebellar hemispheres are again noted. Watershed infarcts in the bilateral frontal lobes and right parietal lobe are unchanged. There is no mass effect, midline shift, or acute hemorrhage. Global atrophy is also noted. Vascular: No hyperdense vessel or unexpected calcification. Skull: Cranium is intact. Sinuses/Orbits: Mastoid air cells are clear. Visualized paranasal sinuses are clear. Orbits are within normal limits. Other: Noncontributory. IMPRESSION: Chronic ischemic changes and atrophy. No acute intracranial pathology. Electronically Signed   By: Marybelle Killings M.D.   On: 12/09/2017 12:04   Ct Angio Chest Pe W Or Wo Contrast  Result Date: 12/10/2017 CLINICAL DATA:  History of pancreatic cancer and pulmonary emboli, on Lovenox. Atrial fibrillation. Assess response to Lovenox. EXAM: CT ANGIOGRAPHY CHEST WITH CONTRAST TECHNIQUE: Multidetector CT imaging of the chest was performed using the standard protocol during bolus administration of intravenous contrast. Multiplanar CT image reconstructions  and MIPs were obtained to evaluate the vascular anatomy. CONTRAST:  1104mL ISOVUE-370 IOPAMIDOL (ISOVUE-370) INJECTION 76% COMPARISON:  11/13/2017 FINDINGS: Cardiovascular: Previously seen left pulmonary emboli are no longer visualized. Embolus in the right lower lobe pulmonary artery is again noted, slightly decreased in size. New pulmonary embolus is now noted in the right upper lobe. 5 cm ascending thoracic aortic aneurysm again noted, compared with 4.8 cm previously. Scattered aortic calcifications. Heart is enlarged. Prior mitral valve replacement. Mediastinum/Nodes: No enlarged mediastinal, hilar or axillary lymph nodes. Lungs/Pleura: Small bilateral pleural effusions are new since prior study. Compressive atelectasis in the lower lobes bilaterally. Calcified pleural plaques are noted on the right, likely related to old hemothorax or empyema. Upper Abdomen: Numerous areas of low-density throughout the liver compatible with metastases as seen on prior study. Musculoskeletal: Right Port-A-Cath remains in place. Chest wall soft tissues otherwise unremarkable. Stable mild compression fracture through the inferior endplate at J62. Review of the MIP images confirms the above findings. IMPRESSION: Improvement in the clot burden within the left lung and in the right lower lobe. There is new pulmonary embolus in the right upper lobe. Small bilateral pleural effusions with compressive atelectasis in the lower lobes. Cardiomegaly. 5 cm ascending thoracic aortic aneurysm. Recommend semi-annual imaging followup by CTA or MRA and referral to cardiothoracic surgery if not already obtained. This recommendation follows 2010 ACCF/AHA/AATS/ACR/ASA/SCA/SCAI/SIR/STS/SVM Guidelines for the Diagnosis and Management of Patients With Thoracic Aortic Disease. Circulation. 2010; 121: E366-Q947 Calcified pleural plaques on the right, stable. Numerous hepatic metastases again noted. Electronically Signed   By: Rolm Baptise M.D.   On:  12/10/2017 09:32    Scheduled Meds: . lipase/protease/amylase  36,000 Units Oral TID AC  . magic mouthwash  10 mL Oral TID AC & HS  . metoprolol tartrate  25 mg Oral BID  . sodium chloride flush  10-40 mL Intracatheter Q12H  Continuous Infusions: . sodium chloride 100 mL/hr at 12/11/17 0330  . amiodarone 30 mg/hr (12/10/17 1820)  . heparin 1,900 Units/hr (12/11/17 0630)  . piperacillin-tazobactam (ZOSYN)  IV Stopped (12/11/17 6333)  . vancomycin Stopped (12/11/17 0922)   PRN Meds:.acetaminophen **OR** [DISCONTINUED] acetaminophen, levalbuterol, LORazepam, metoprolol tartrate, morphine injection, ondansetron **OR** ondansetron (ZOFRAN) IV, oxyCODONE, sodium chloride flush   EKG: 12/08/2017: Atypical atrial flutter with variable ventricular response/rapid ventricular response.  Nonspecific T abnormality.  Telemetry: 12/11/2017: Patient in normal sinus rhythm, frequent PACs, atrial couplets.  TEE 11/22/2017: Left ventricle: Poorly visualized due to mechanical MV  reverberation artifact. Systolic function was moderately to   severely reduced. The estimated ejection fraction was in the   range of 30% to 35%. Consider TTE to evaluate LV systolic   function. Diffuse hypokinesis. Regional wall motion abnormalities   cannot be excluded. - Aortic valve: A mechanical prosthesis was present. - Mitral valve: Mechanical mitral valve prosthesis is noted. Mean  gradient is 6 mmHg. There is very minimal paravalvular leak.  Trace physiologic mitral regurgitation is also noted. There is  subvalvular mobile mass consistency of a healed vegetation and  highly mobile. There is highly mobile thrombus attached to   subvalvular apparatus Vs vegetation. Most probably thrombus in  view of patient&'s medical history. - Left atrium: The atrium was dilated. There was moderate  spontaneous echo contrast (&quot;smoke&quot;) in the appendage. The  appendage was well visualized and dilated. Emptying velocity was  mildly  reduced. - Right atrium: No evidence of thrombus in the atrial cavity or  appendage. - Tricuspid valve: No evidence of vegetation. - Pulmonic valve: No evidence of vegetation.  Echo: 11/13/2017: Low normal LVEF at 50 to 55%.  Mildly dilated aortic annulus.  Mechanical AV prosthetic valve present.  Mechanical mitral valve, severely calcified annulus, vegetation on the left ventricular aspect noted.  Based on the appearance, thrombus or fibroblastoma cannot be excluded.  Fimbriated mass, multiple noted.  ASSESSMENT AND PLAN:  1.  Paroxysmal atypical atrial flutter with RVR presently maintaining sinus rhythm with frequent PACs. 2.  History of mechanical mitral and aortic valve 3.  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  Recommendation: I have discontinued IV amiodarone and switch him to amiodarone 200 mg p.o. twice daily.  Upon discharge this can be reduced to 200 mg daily.  He has elevated liver enzymes, related to pancreatic cancer, amiodarone probably will not make much of a difference.  He also has ascites and stage IV pancreatic cancer, prognosis extremely guarded.  Rate control strategy is indicated.  He is presently on IV heparin, could consider switching him back to Lovenox as per hematology and oncology due to hypercoagulable state.  Previously was on Coumadin.  Continue metoprolol.  Adrian Prows, MD 12/11/2017, 10:04 AM [facility]. [place] Pager: [phone] Office: [phone] If no answer Cell [phone]

## 2017-12-11 NOTE — Progress Notes (Signed)
Palliative:  I met today with [name] and 2 daughters, Denman George and Aldona Bar. Mr. Horton is feeling a little better and more open to discussion today. He is more accepting of my presence today. Reviewed the emotional roller coaster of this past month and reviewed the difficulty he has had with his chemotherapy treatment. Mr. Koppelman is very clear that he intends "to keep fighting this" and "I am going to beat this." When I asked what he meant by "beating this" he tells me for his cancer to go away. I reviewed with them the intent of chemotherapy to control and maintain his cancer but that this will not cure him. We discussed that only God would be able to heal because unfortunately we cannot.   They further inquired about formal documentation and I reviewed Living Will and HCPOA with Mr. Sigal. I will help him document this tomorrow so we can get this notarized (other caregivers currently in room). They are interested in having documentation for his finances as well and I pointed them towards more legal assistance for this. They have suffered much loss and even his girls at a young age have lost their mother and siblings. Mr. Kronk has lost sons. They are too familiar with death but also have learned and wish to be more prepared by discussing Mr. Corella wishes and to complete Living Will. All questions/concerns addressed. Emotional support provided.   58 min  Vinie Sill, NP Palliative Medicine Team Pager # [phone] (M-F 8a-5p) Team Phone # [phone] (Nights/Weekends)

## 2017-12-12 ENCOUNTER — Inpatient Hospital Stay: Payer: Medicare Other | Admitting: Hematology & Oncology

## 2017-12-12 ENCOUNTER — Inpatient Hospital Stay: Payer: Medicare Other

## 2017-12-12 LAB — COMPREHENSIVE METABOLIC PANEL
ALBUMIN: 1.9 g/dL — AB (ref 3.5–5.0)
ALK PHOS: 468 U/L — AB (ref 38–126)
ALT: 82 U/L — ABNORMAL HIGH (ref 17–63)
AST: 73 U/L — AB (ref 15–41)
Anion gap: 6 (ref 5–15)
BUN: 19 mg/dL (ref 6–20)
CO2: 25 mmol/L (ref 22–32)
Calcium: 7.8 mg/dL — ABNORMAL LOW (ref 8.9–10.3)
Chloride: 107 mmol/L (ref 101–111)
Creatinine, Ser: 0.89 mg/dL (ref 0.61–1.24)
GFR calc Af Amer: >60 mL/min (ref >60)
GFR calc non Af Amer: >60 mL/min (ref >60)
GLUCOSE: 123 mg/dL — AB (ref 65–99)
POTASSIUM: 3.7 mmol/L (ref 3.5–5.1)
SODIUM: 138 mmol/L (ref 135–145)
Total Bilirubin: 5.7 mg/dL — ABNORMAL HIGH (ref 0.3–1.2)
Total Protein: 4.8 g/dL — ABNORMAL LOW (ref 6.5–8.1)

## 2017-12-12 LAB — CBC
HEMATOCRIT: 28.8 % — AB (ref 39.0–52.0)
Hemoglobin: 9.6 g/dL — ABNORMAL LOW (ref 13.0–17.0)
MCH: 28.6 pg (ref 26.0–34.0)
MCHC: 33.3 g/dL (ref 30.0–36.0)
MCV: 85.7 fL (ref 78.0–100.0)
PLATELETS: 33 10*3/uL — AB (ref 150–400)
RBC: 3.36 MIL/uL — AB (ref 4.22–5.81)
RDW: 16.2 % — ABNORMAL HIGH (ref 11.5–15.5)
WBC: 10.2 10*3/uL (ref 4.0–10.5)

## 2017-12-12 LAB — VITAMIN B12: Vitamin B-12: 4850 pg/mL — ABNORMAL HIGH (ref 180–914)

## 2017-12-12 LAB — FOLATE: Folate: 9.6 ng/mL (ref >5.9)

## 2017-12-12 NOTE — Progress Notes (Signed)
Palliative:  I went to visit with Jeff [name] today but he is sleeping. His 2 daughters are at bedside and inquire about his chemotherapy. I explained that the RN and the pharmacist are not aware of any plans to have chemotherapy today. I explained that this is not unusual to postponed until Jeff [name] acute hospital issues are more resolved. They have no further questions or concerns.  Later entry: I came back when Jeff [name] is awake and same conversation with him regarding chemotherapy. He is feeling a little better today. Hoping he is getting closer to returning home. I readdressed Living Will today and he wishes to discuss this further with his daughters. I will come back tomorrow to see if he is ready to complete.   15 min  Vinie Sill, NP Palliative Medicine Team Pager # [phone] (M-F 8a-5p) Team Phone # [phone] (Nights/Weekends)

## 2017-12-12 NOTE — Progress Notes (Signed)
Patient told to staff that his chemo treatment is due today. Writer called MD and informed him about this. No new order at this time. Will continue to monitor.

## 2017-12-12 NOTE — Progress Notes (Signed)
[facility] TEAM 1 - Stepdown/ICU TEAM  Jeff [name]  HER:[id number] DOB: 25-Aug-1955 DOA: 12/08/2017 PCP: Kerin Perna, NP    Brief Narrative:  62yo M w/ a hx of HTN, A. fib, CVA,  S/p mechanical AV & MV on chronic anticoagulation, DVT, and metastatic pancreatic cancer followed by Dr. Marin Olp who presented after being found to be progressively more altered since initiating chemotherapy on 12/05/2017.  Associated symptoms included cough, vomiting, diarrhea, worsening abdominal pain, and poor p.o. intake.   In the ED the patient was found to be in A. fib with RVR with heart rate up to 162 bpm.  He also met sepsis criteria. ----------------------------------------------------------------------------------------------------------------------------------- Significant Events: 4/27 admit  4/30 2U PRBC  -----------------------------------------------------------------------------------------------------------------------------------  Subjective: Seen and examined this morning, in good spirits, reporting of nosebleed this morning.  Has a spontaneously stopped. Daughter present at bedside Inquiring about oncology service and possible treatment today   Assessment & Plan:  SIRS v/s Sepsis, unknown origin -Source unknown, leukocytosis improving, lactic acid improving -On admission WBC is elevated as high 16.7, lactic acid 3.84, chest x-ray was within normal limits, previously noted mitral valve thrombus versus vegetation w/ endocarditis a possible cause -  doubt a true infectious source and suspect more related to new PE and Afib as well as DH - stop abx and follow - SIRS physiology has resolved  -Patient is subsequently much improved, afebrile, normotensive, improved leukocytosis 10.2 Lactic acid and procalcitonin within normal limits 1.9, 4.36 respectively.  Acute encephalopathy/delirium v/s progressive dementia CT head without contrast 4/28 noted diffuse atrophy but no acute findings  -E56  and folic acid are within normal limits - check B-12 elevated, and folic acid levels were within normal limits, TSH within normal limits, -This patient's mentation is at baseline per patient daughters at bedside we will not pursue any further neurological work-up -Baseline dementia noted.  Transaminitis  Likely combination of shock liver plus liver metastasis  -Stable, LFTs continue to improve  A. 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 Marin Olp  - received his first round of chemotherapy on 4/24 - due for his next dose on 5/1  -Patient and family anticipating another round of treatment today, will investigate  Extensive B PE April 2019 - L LE DVT (initial timing unclear) CT angio this admit notes overall decrease in clot burden, but new clot in the R apical territory - continue anticoagulation w/ Lovenox  CVA Dec 2018 -Stable, no new focal neurological findings  DVT prophylaxis: lovenox   Code Status: FULL CODE Family Communication: no family present at time of exam  Disposition Plan: tele bed   Consultants:  PCCM Cardiology   Antimicrobials:  Zosyn 4/27 > 4/29 Vanc 4/27 > 4/29  Objective: Blood pressure 118/75, pulse 83,  temperature 98.1 F (36.7 C), temperature source Oral, resp. rate 16, weight 75.3 kg (166 lb 0.1 oz), SpO2 99 %.  Intake/Output Summary (Last 24 hours) at 12/12/2017 1530 Last data filed at 12/12/2017 1151 Gross per 24 hour  Intake 270 ml  Output 600 ml  Net -330 ml   Filed Weights   12/11/17 0548 12/12/17 0617  Weight: 74.2 kg (163 lb 9.3 oz) 75.3 kg (166 lb 0.1 oz)    Examination: General: No acute distress, alert and oriented x2 Lungs: Clear to oscillation bilaterally to wheeze no crackles Cardiovascular: Regular rate and rhythm, no free rubs or gallops Abdomen: Soft nondistended distended positive bowel sounds Extremities: No significant E B LE    CBC: Recent Labs  Lab 12/10/17 0354 12/11/17 1811 12/12/17 0439  WBC 8.2 11.0* 10.2  NEUTROABS 7.7  --   --   HGB 7.6* 9.5* 9.6*  HCT 23.6* 28.6* 28.8*  MCV 89.4 85.9 85.7  PLT 91* 39* 33*   Basic Metabolic Panel: Recent Labs  Lab 12/09/17 0652 12/10/17 0354 12/11/17 0438 12/12/17 0439  NA  --  139 140 138  K  --  4.1 3.4* 3.7  CL  --  108 107 107  CO2  --  '24 25 25  '$ GLUCOSE  --  152* 123* 123*  BUN  --  25* 24* 19  CREATININE  --  0.89 1.01 0.89  CALCIUM  --  7.9* 7.9* 7.8*  MG 2.0 1.8  --   --   PHOS 3.0 2.3*  --   --    GFR: Estimated Creatinine Clearance: 90.5 mL/min (by C-G formula based on SCr of 0.89 mg/dL).  Liver Function Tests: Recent Labs  Lab 12/08/17 2009 12/10/17 0354 12/11/17 0438 12/12/17 0439  AST 204* 123* 110* 73*  ALT 131* 103* 103* 82*  ALKPHOS 352* 294* 406* 468*  BILITOT 5.4* 4.5* 6.0* 5.7*  PROT 6.4* 5.3* 5.1* 4.8*  ALBUMIN 2.6* 2.1* 2.0* 1.9*   Recent Labs  Lab 12/08/17 2147  LIPASE 19   Recent Labs  Lab 12/08/17 2327  AMMONIA 30    HbA1C: Hgb A1c MFr Bld  Date/Time Value Ref Range Status  11/08/2017 04:59 AM 4.7 (L) 4.8 - 5.6 % Final    Comment:    (NOTE) Pre diabetes:          5.7%-6.4% Diabetes:              >6.4% Glycemic control for   <7.0% adults with  diabetes   07/30/2017 07:00 AM 5.6 4.8 - 5.6 % Final    Comment:    (NOTE)         Prediabetes: 5.7 - 6.4         Diabetes: >6.4         Glycemic control for adults with diabetes: <7.0     CBG: Recent Labs  Lab 12/08/17 2018  GLUCAP 158*    Recent Results (from the past 240 hour(s))  Blood Culture (routine x 2)     Status: None (Preliminary result)   Collection Time: 12/08/17  9:30 PM  Result Value Ref Range Status   Specimen Description BLOOD LEFT HAND  Final   Special Requests   Final    BOTTLES DRAWN AEROBIC AND ANAEROBIC Blood Culture adequate volume   Culture   Final    NO GROWTH 4 DAYS Performed at [facility], [place] [address] [place], [place] [address]  Report Status PENDING  Incomplete  Blood Culture (routine x 2)     Status: None (Preliminary result)   Collection Time: 12/08/17  9:45 PM  Result Value Ref Range Status   Specimen Description BLOOD LEFT HAND  Final   Special Requests   Final    BOTTLES DRAWN AEROBIC AND ANAEROBIC Blood Culture adequate volume   Culture   Final    NO GROWTH 4 DAYS Performed at [facility], [address], [place], [place] [address]    Report Status PENDING  Incomplete  MRSA PCR Screening     Status: None   Collection Time: 12/10/17  3:51 AM  Result Value Ref Range Status   MRSA by PCR NEGATIVE NEGATIVE Final    Comment:        The GeneXpert MRSA Assay (FDA approved for NASAL specimens only), is one component of a comprehensive MRSA colonization surveillance program. It is not intended to diagnose MRSA infection nor to guide or monitor treatment for MRSA infections. Performed at [facility], [place] [address], [place], [place] [address]      Scheduled Meds: . amiodarone  200 mg Oral BID  . enoxaparin (LOVENOX) injection  1 mg/kg Subcutaneous Once   Followed by  . [START ON 12/13/2017] enoxaparin (LOVENOX) injection  1 mg/kg Subcutaneous Q12H  . lipase/protease/amylase  36,000 Units Oral TID AC  .  magic mouthwash  10 mL Oral TID AC & HS  . metoprolol tartrate  25 mg Oral BID  . sodium chloride flush  10-40 mL Intracatheter Q12H     LOS: 3 days   If 7PM-7AM, please contact night-coverage [email or web address] Password [email or web address] 12/12/2017, 3:30 PM

## 2017-12-13 DIAGNOSIS — I330 Acute and subacute infective endocarditis: Secondary | ICD-10-CM

## 2017-12-13 DIAGNOSIS — A409 Streptococcal sepsis, unspecified: Secondary | ICD-10-CM

## 2017-12-13 DIAGNOSIS — C252 Malignant neoplasm of tail of pancreas: Secondary | ICD-10-CM

## 2017-12-13 DIAGNOSIS — R410 Disorientation, unspecified: Secondary | ICD-10-CM

## 2017-12-13 DIAGNOSIS — Z86718 Personal history of other venous thrombosis and embolism: Secondary | ICD-10-CM

## 2017-12-13 LAB — COMPREHENSIVE METABOLIC PANEL
ALBUMIN: 1.8 g/dL — AB (ref 3.5–5.0)
ALK PHOS: 502 U/L — AB (ref 38–126)
ALT: 72 U/L — ABNORMAL HIGH (ref 17–63)
AST: 75 U/L — ABNORMAL HIGH (ref 15–41)
Anion gap: 6 (ref 5–15)
BILIRUBIN TOTAL: 5.6 mg/dL — AB (ref 0.3–1.2)
BUN: 21 mg/dL — ABNORMAL HIGH (ref 6–20)
CALCIUM: 7.6 mg/dL — AB (ref 8.9–10.3)
CO2: 25 mmol/L (ref 22–32)
CREATININE: 0.96 mg/dL (ref 0.61–1.24)
Chloride: 105 mmol/L (ref 101–111)
GFR calc non Af Amer: >60 mL/min (ref >60)
GLUCOSE: 131 mg/dL — AB (ref 65–99)
Potassium: 3.9 mmol/L (ref 3.5–5.1)
Sodium: 136 mmol/L (ref 135–145)
TOTAL PROTEIN: 4.7 g/dL — AB (ref 6.5–8.1)

## 2017-12-13 LAB — CULTURE, BLOOD (ROUTINE X 2)
CULTURE: NO GROWTH
CULTURE: NO GROWTH
Special Requests: ADEQUATE
Special Requests: ADEQUATE

## 2017-12-13 MED ORDER — ENOXAPARIN SODIUM 80 MG/0.8ML ~~LOC~~ SOLN: 75.0000 mg | Freq: Two times a day (BID) | SUBCUTANEOUS | 2 refills | Status: DC

## 2017-12-13 MED ORDER — HEPARIN SOD (PORK) LOCK FLUSH 100 UNIT/ML IV SOLN
500.0000 [IU] | INTRAVENOUS | Status: AC | PRN
  Administered 2017-12-13: 500 [IU]

## 2017-12-13 MED ORDER — DRONABINOL 2.5 MG PO CAPS
5.0000 mg | ORAL_CAPSULE | Freq: Two times a day (BID) | ORAL | Status: DC
  Administered 2017-12-13: 5 mg via ORAL
  Filled 2017-12-13: qty 2

## 2017-12-13 MED ORDER — MAGIC MOUTHWASH: 10.0000 mL | Freq: Three times a day (TID) | ORAL | 0 refills | Status: AC

## 2017-12-13 MED ORDER — AMIODARONE HCL 200 MG PO TABS: 200.0000 mg | ORAL_TABLET | Freq: Every day | ORAL | 0 refills | Status: AC

## 2017-12-13 MED ORDER — AMIODARONE HCL 200 MG PO TABS: 200.0000 mg | ORAL_TABLET | Freq: Two times a day (BID) | ORAL | 0 refills | Status: DC

## 2017-12-13 NOTE — Discharge Instructions (Signed)
Atrial Fibrillation Atrial fibrillation is a type of heartbeat that is irregular or fast (rapid). If you have this condition, your heart keeps quivering in a weird (chaotic) way. This condition can make it so your heart cannot pump blood normally. Having this condition gives a person more risk for stroke, heart failure, and other heart problems. There are different types of atrial fibrillation. Talk with your doctor to learn about the type that you have. Follow these instructions at home:  Take over-the-counter and prescription medicines only as told by your doctor.  If your doctor prescribed a blood-thinning medicine, take it exactly as told. Taking too much of it can cause bleeding. If you do not take enough of it, you will not have the protection that you need against stroke and other problems.  Do not use any tobacco products. These include cigarettes, chewing tobacco, and e-cigarettes. If you need help quitting, ask your doctor.  If you have apnea (obstructive sleep apnea), manage it as told by your doctor.  Do not drink alcohol.  Do not drink beverages that have caffeine. These include coffee, soda, and tea.  Maintain a healthy weight. Do not use diet pills unless your doctor says they are safe for you. Diet pills may make heart problems worse.  Follow diet instructions as told by your doctor.  Exercise regularly as told by your doctor.  Keep all follow-up visits as told by your doctor. This is important. Contact a doctor if:  You notice a change in the speed, rhythm, or strength of your heartbeat.  You are taking a blood-thinning medicine and you notice more bruising.  You get tired more easily when you move or exercise. Get help right away if:  You have pain in your chest or your belly (abdomen).  You have sweating or weakness.  You feel sick to your stomach (nauseous).  You notice blood in your throw up (vomit), poop (stool), or pee (urine).  You are short of  breath.  You suddenly have swollen feet and ankles.  You feel dizzy.  Your suddenly get weak or numb in your face, arms, or legs, especially if it happens on one side of your body.  You have trouble talking, trouble understanding, or both.  Your face or your eyelid droops on one side. These symptoms may be an emergency. Do not wait to see if the symptoms will go away. Get medical help right away. Call your local emergency services (911 in the [place] Do not drive yourself to the hospital. This information is not intended to replace advice given to you by your health care provider. Make sure you discuss any questions you have with your health care provider. Document Released: 05/09/2008 Document Revised: 01/06/2016 Document Reviewed: 11/25/2014 Elsevier Interactive Patient Education  2018 [employer].   Preventing Atrial Fibrillation-Related Stroke Atrial fibrillation is a common type of irregular or rapid heartbeat (arrhythmia) that greatly increases your risk for a stroke. In atrial fibrillation, the top portions of the heart (atria) beat out of sync with the lower portions of the heart. When the muscles of the atria are tightening in an uncoordinated way (fibrillating), blood can pool in the heart and form clots. If a clot travels to the brain, it can cause a stroke. This type of stroke is preventable. Understanding atrial fibrillation and knowing how to properly manage it can prevent you from having a stroke. What increases my risk for a stroke? If you have atrial fibrillation, you may be at increased risk  for a stroke if you also:  Have heart failure.  Have high blood pressure.  Are older than age 46.  Have diabetes.  Have a history of vascular disease, such as heart attack or stroke.  Are male.  If you have atrial fibrillation and you also have one or more of those risk factors, talk with your health care provider about treatments that can prevent a stroke. Other risk  factors for a stroke include:  Smoking.  High cholesterol.  Diabetes.  Being inactive (sedentary lifestyle).  Having a family history of stroke.  Eating a diet that is high in fat, cholesterol, and salt.  What treatments help to manage atrial fibrillation? The main goals of treatment for atrial fibrillation are to prevent blood clots from forming and to keep your heart beating at a normal rate and rhythm. Treatment may include:  Blood-thinning medicine (anticoagulant) that helps to prevent clots from forming. This medicine also increases the risk of bleeding. Talk with your health care provider about the risks and benefits of taking anticoagulants.  Medicine that slows the heart rate or brings the heart rhythm back to normal.  Electrical cardioversion. This is a procedure that resets the heart's rhythm by delivering a controlled, low-energy shock through your skin to your heart.  An ablation procedure, such as catheter ablation, catheter ablation with pacemaker, or surgical ablation. These procedures destroy the heart tissues that send abnormal signals so that heart rhythms can be improved or made normal. A pacemaker is a device that is placed under the skin to help the heart beat in a regular rhythm.  How can I prevent atrial fibrillation-related stroke? Medicines  Take over-the-counter and prescription medicines only as told by your health care provider.  If your health care provider prescribed an anticoagulant, take it exactly as told. Taking too much blood-thinning medicine can cause bleeding. If you do not take enough blood-thinning medicine, you will not have the protection that you need against stroke and other problems. Eating and drinking  Eat healthy foods, including at least 5 servings of fruits and vegetables a day.  Do not drink alcohol.  Do not drink beverages that contain caffeine, such as coffee, soda, and tea.  Follow dietary instructions as told by your health  care provider. Managing other medical conditions  Manage and be aware of your blood pressure. If you have high blood pressure, follow your treatment plan to keep it in your target range.  Have your cholesterol checked as often as recommended by your health care provider. If you have high cholesterol, follow your treatment plan to lower it and keep it in your target range.  Talk with your health care provider about symptoms to watch for. Some people may not have any symptoms, so it can be hard to know that they have atrial fibrillation. Talk with your health care provider if you experience: ? A feeling that your heart is beating rapidly or irregularly. ? An irregular pulse. ? A feeling of discomfort or pain in your chest. ? Shortness of breath. ? Sudden light-headedness or weakness. ? Tiredness (fatigue) that happens easily during exercise.  If you have obstructive sleep apnea (OSA), manage your condition as told by your health care provider. General instructions  Maintain a healthy weight. Do not use diet pills unless your health care provider approves. Diet pills may make heart problems worse.  Exercise regularly. Get at least 30 minutes of activity on most or all days, or as told by your health care  provider.  Do not use any products that contain nicotine or tobacco, such as cigarettes and e-cigarettes. If you need help quitting, ask your health care provider.  Do not use drugs, such as cocaine and amphetamines.  Keep all follow-up visits as told by your health care providers. This is important. These include visits with your heart specialist. Where to find more information: You may find more information about preventing atrial fibrillation-related stroke from:  National Stroke Association (AFib-Stroke Connection): www.stroke.org  Contact a health care provider if:  You notice a change in the rate, rhythm, or strength of your heartbeat.  You have dizziness.  You are taking an  anticoagulant and you have more bruises than usual.  You tire out more easily when you exercise or do similar activities. Get help right away if:  You have chest pain.  You have pain in your abdomen.  You experience unusual sweating or weakness.  You take anticoagulants and you: ? Have severe headaches or confusion. ? Have blood in your vomit, bowel movement, or urine. ? Have bleeding that will not stop. ? Fall or injure your head.  You have any symptoms of a stroke. "BE FAST" is an easy way to remember the main warning signs of a stroke: ? B - Balance. Signs are dizziness, trouble walking, or loss of balance. ? E - Eyes. Signs are trouble seeing or a sudden change in vision. ? F - Face. Signs are sudden weakness or numbness of the face, or the face or eyelid drooping on one side. ? A - Arms. Signs are weakness or numbness in an arm. This happens suddenly and usually on one side of the body. ? S - Speech. Signs are sudden trouble speaking, slurred speech, or trouble understanding what people say. ? T - Time. Time to call emergency services. Write down what time symptoms started.  You have other signs of a stroke, such as: ? A sudden, severe headache with no known cause. ? Nausea or vomiting. ? Seizure. These symptoms may represent a serious problem that is an emergency. Do not wait to see if the symptoms will go away. Get medical help right away. Call your local emergency services (911 in the [place] Do not drive yourself to the hospital. Summary  Having atrial fibrillation increases the risk for a stroke. Talk with your health care provider about what symptoms to watch for.  Atrial fibrillation-related stroke is preventable. Proper management of atrial fibrillation can prevent you from having a stroke.  Talk with your health care provider about whether anticoagulant medicine is right for you.  Learn the warning signs of a stroke and remember "BE FAST." This information is not  intended to replace advice given to you by your health care provider. Make sure you discuss any questions you have with your health care provider. Document Released: 11/15/2016 Document Revised: 11/15/2016 Document Reviewed: 11/15/2016 Elsevier Interactive Patient Education  2018 [employer].   Pancreatic Cancer Pancreatic cancer is an abnormal growth of cells (tumor) in the pancreas that is cancerous (malignant). The pancreas is a gland located in the abdomen, between the stomach and the spine. The pancreas makes hormones, including insulin and glucagon. These hormones control your blood sugar and help your body use and store energy that comes from food. The pancreas also makes pancreatic juices that help digest food. There are two types of pancreatic cancer:  Exocrine. This is the most common type.  Endocrine.  The different types of cancer have their own  causes, risk factors, and treatment. Pancreatic cancer can spread (metastasize) to other parts of the body. What are the causes? The exact cause of this condition is not known. What increases the risk? The following factors may make you more likely to develop this condition:  Age. The risk of pancreatic cancer increases with age. This condition most commonly occurs in people over the age of 39.  Tobacco use, including cigarettes, chewing tobacco, or e-cigarettes.  Being male.  Being African American.  Having a family history of cancer of the pancreas, colon, or ovaries.  Having diabetes, especially if you were diagnosed as an adult.  Having chronic pancreatitis.  Being exposed to certain chemicals.  Being obese and having a decreased level of physical activity.  Eating a diet that is high in fat and red meat.  Having an infection in your stomach caused by a strain of bacteria (Helicobacter pylori or H. pylori). This infection can lead to ulcers.  Having certain hereditary conditions or gene mutations.  Cirrhosis. This  is the damage and scarring of the liver, which may be caused by hepatitis or heavy alcohol use.  What are the signs or symptoms? In the early stages, there are often no symptoms of this condition. As the cancer gets worse (progresses), symptoms may vary depending on the type of pancreatic cancer you have. Symptoms include:  Yellowing of the skin or eyes (jaundice).  Weakness.  Abdominal pain.  Diarrhea.  Depression.  Loss of appetite and weight loss.  Indigestion.  Pain in the upper abdomen or upper back.  A lump under the rib cage on the right side.  Nausea and vomiting.  Fatigue.  Stools that are light-colored and greasy-looking, or stools that are black and tarry-looking.  Dark urine.  High blood sugar (hyperglycemia). This may cause increased thirst and frequent urination.  Low blood sugar (hypoglycemia). This may cause confusion, sweating, and a fast heartbeat.  Itchy skin.  Skin that feels uneven.  How is this diagnosed? This condition may be diagnosed based on your medical history and a physical exam. This may include checking your skin and eyes for signs of jaundice and checking your abdomen for any changes in the areas near the pancreas. Your health care provider will also check for a collection of excess fluid in the abdomen (ascites). Tests will also be done, such as:  Blood tests.  Urine tests.  Ultrasound.  CT scan.  MRI.  Removal of a sample of pancreatic tissue to be examined under a microscope (biopsy).  Other tests and procedures may also be done. If pancreatic cancer is confirmed, it will be staged to determine its severity and extent. Staging is an assessment of:  The size of the tumor.  Whether the cancer has spread.  Where the cancer has spread.  How is this treated? Depending on the type and stage of your pancreatic cancer, treatment may include:  Surgery to remove all or part of the pancreas, or to remove a tumor.  Radiation  therapy. This is the use of high-energy rays to kill cancer cells.  Chemotherapy. This is the use of medicines to stop or slow the growth of cancer cells.  Targeted therapy. This treatment targets specific parts of cancer cells and the area around them to block the growth and spread of the cancer.  Your health care provider may recommend a combination of surgery, radiation therapy, and chemotherapy. You may be referred to a health care provider who specializes in cancer (oncologist). Follow  these instructions at home: Medicines  Take over-the-counter and prescription medicines only as told by your health care provider.  Do not drive or operate heavy machinery while taking prescription pain medicine. General instructions  Return to your normal activities as told by your health care provider. Ask your health care provider what activities are safe for you.  Do not use tobacco products, including cigarettes, chewing tobacco, or e-cigarettes. If you need help quitting, ask your health care provider.  Maintain a healthy diet.  Drink enough fluid to keep your urine clear or pale yellow.  Consider joining a support group. This may help you learn to cope with the stress of having pancreatic cancer.  Work with your health care provider to manage any side effects of your treatment.  Keep all follow-up visits as told by your health care provider. This is important. Contact a health care provider if:  You feel nauseous or you vomit.  You have unexplained weight loss. Get help right away if:  You have pain that suddenly gets worse.  You have chest pain or an irregular heartbeat.  You have trouble breathing. You cannot eat or drink without vomiting.  You have blood in your vomit or dark, tarry stools.  Your skin or eyes turn more yellow.  You develop new fatigue or weakness.  You have abdominal bloating or pain. This information is not intended to replace advice given to you by your  health care provider. Make sure you discuss any questions you have with your health care provider. Document Released: 07/15/2004 Document Revised: 01/06/2016 Document Reviewed: 07/12/2015 Elsevier Interactive Patient Education  [employer].

## 2017-12-13 NOTE — Progress Notes (Signed)
Jeff [name] seems to be looking better.  He is now off a lot of his IV fluids.  The atrial fibrillation seems to be under much better control.  He did get a couple units of blood a few days ago.  He has had no bleeding.  His platelet count is on the low side.  He is on Lovenox.  I would continue the Lovenox despite the thrombocytopenia.  I think he is incredibly hypercoagulable.  There is no CBC from today.  His metabolic panel shows a creatinine of 0.96.  His albumin is 1.8.  He does not complain of any pain.  There is no diarrhea.  He has had no leg swelling.  His physical exam shows stable vital signs.  His blood pressure is 119/80.  His pulse is 89.  His lungs sound clear bilaterally.  Cardiac exam is irregular rate and rhythm consistent with atrial fibrillation.  The right is well controlled.  Abdomen is soft.  There is no fluid wave.  There is no abdominal mass.  There is no guarding or rebound tenderness.  Back exam shows no tenderness over the spine, ribs or hips.  Extremities shows no clubbing, cyanosis or edema.  I would think that some physical therapy might be a reasonable thing to try to get Jeff [name] little bit stronger.  For right now, we will just have to see how things go with respect to any more treatment for him.  I do worry that his albumin is quite low.  Lattie Haw, MD  [facility] 4:1

## 2017-12-13 NOTE — Discharge Summary (Addendum)
Discharge Summary  Jeff [name] MRN:[id number] DOB: 11/30/1955  PCP: Edwards, Michelle P, NP  Admit date: 12/08/2017 Discharge date: 12/13/2017  Time spent: 25 minutes  Recommendations for Outpatient Follow-up:  1. Follow-up with oncology 2. Follow-up with PCP 3. Take your medications as prescribed 4. Fall precautions  Discharge Diagnoses:  Active Hospital Problems   Diagnosis Date Noted  . Sepsis (HCC) 12/09/2017  . Confusion   . Atrial fibrillation with RVR (HCC) 12/09/2017  . Acute encephalopathy 12/09/2017  . History of DVT (deep vein thrombosis) 12/09/2017  . Mitral valve vegetation 12/09/2017  . Pancreas cancer (HCC)     Resolved Hospital Problems  No resolved problems to display.    Discharge Condition: Stable  Diet recommendation: Resume previous diet  Vitals:   12/13/17 0631 12/13/17 0830  BP: 119/80 111/82  Pulse: 89 87  Resp: 16   Temp: 98.1 F (36.7 C)   SpO2: 97%     History of present illness:  62yo M w/ a hx ofHTN, A. 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 Ennever who presentedafter being found to be progressively more altered since initiating chemotherapy on 12/05/2017. Associated symptoms includedcough, vomiting, diarrhea, worsening abdominal pain, and poor p.o. intake.   In the ED the patient was found to be inA. fib with RVR with heart rateup to 162bpm.  He also met sepsis criteria.  12/13/17: Patient seen and examined at his bedside.  He denies any chest pain abdominal pain nausea or vomiting.  He inquires about going home.  On the day of discharge, patient was hemodynamically stable.  He is alert and oriented x3.  He will need to follow-up with his PCP and oncology outpatient.  Hospital Course:  Principal Problem:   Sepsis (HCC) Active Problems:   Pancreas cancer (HCC)   Atrial fibrillation with RVR (HCC)   Acute encephalopathy   History of DVT (deep vein  thrombosis)   Mitral valve vegetation   Confusion  SIRS v/s Sepsis,unknown origin -Source unknown, leukocytosis improving, lactic acid improving -On admission WBC is elevated as high 16.7, lactic acid 3.84, chest x-ray was within normal limits, previously noted mitral valve thrombus versus vegetation w/endocarditis a possible cause -  doubt a true infectious source and suspect more related to new PE and Afib as well as DH - stop abx and follow - SIRS physiology has resolved  -Patient is subsequently much improved, afebrile, normotensive -Leukocytosis has resolved  Acute encephalopathy/delirium v/s progressive dementia Acute encephalopathy has resolved Unable to clinically determined the type of encephalopathy. CT head without contrast 4/28 noted diffuse atrophy but no acute findings  -B12 and folic acid are within normal limits - check B-12 elevated, and folic acid levels were within normal limits, TSH within normal limits, -This patient's mentation is at baseline per patient daughters at bedside we will not pursue any further neurological work-up  Transaminitis, resolving Likely combination of shock liver plus liver metastasis  -Stable, LFTs continue to improve -Follow-up with PCP  A. 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 Marin Olp  - received his first round of chemotherapy on 4/24 - due for his next dose on 5/1  -Patient and family anticipating another round of treatment today, will investigate  Extensive B PE April 2019 - L LE DVT (initial timing unclear) CT angio this admit notes overall decrease in clot burden, but new clot in the R apical territory - continue anticoagulation w/ Lovenox  CVA Dec 2018 -Stable, no new focal neurological findings    Consultations:  PCCM  Oncology  Discharge Exam: BP 111/82   Pulse 87   Temp 98.1 F (36.7 C)   Resp 16   Wt 75.7 kg (166 lb 14.2 oz)   SpO2 97%   BMI 22.63 kg/m    . General: 62 y.o. year-old male frail in no acute distress.  Alert and oriented x3. . Cardiovascular: Irregular rate and rhythm with no rubs or gallops.  No thyromegaly or JVD noted.   Marland Kitchen Respiratory: Clear to auscultation with no wheezes or rales. Good inspiratory effort. . Abdomen: Soft nontender nondistended with normal bowel sounds x4 quadrants. . Musculoskeletal: No lower extremity edema. 2/4 pulses in all 4 extremities. . Skin: No ulcerative lesions noted or rashes, . Psychiatry: Mood is appropriate for condition and setting  Discharge Instructions You were cared for by a hospitalist during your hospital stay. If you have any questions about your discharge medications or the care you received while you were in the hospital after you are discharged, you can call the unit and asked to speak with the hospitalist on call if the hospitalist that took care of you is not available. Once you are discharged, your primary care physician will handle any further medical issues. Please note that NO REFILLS for any discharge medications will be authorized once you are discharged, as it is imperative that you return to your primary care physician (or  establish a relationship with a primary care physician if you do not have one) for your aftercare needs so that they can reassess your need for medications and monitor your lab values.   Allergies as of 12/13/2017   No Known Allergies     Medication List    TAKE these medications   amiodarone 200 MG tablet Commonly known as:  PACERONE Take 1 tablet (200 mg total) by mouth daily.   aspirin 81 MG EC tablet Take 1 tablet (81 mg total) by mouth daily.   diltiazem 180 MG 24 hr capsule Commonly known as:  CARDIZEM CD Take 1 capsule (180 mg total) by mouth daily.   dronabinol 5 MG capsule Commonly known as:  MARINOL Take 1 capsule (5 mg total) by mouth 2 (two) times daily before lunch and supper.   enoxaparin 80 MG/0.8ML injection Commonly known as:  LOVENOX Inject 0.75 mLs (75 mg total) into the skin every 12 (twelve) hours.   lidocaine-prilocaine cream Commonly known as:  EMLA Apply to affected area once What changed:  Another medication with the same name was removed. Continue taking this medication, and follow the directions you see here.   lipase/protease/amylase 36000 UNITS Cpep capsule Commonly known as:  CREON Take 1 capsule (36,000 Units  total) by mouth 3 (three) times daily before meals.   LORazepam 0.5 MG tablet Commonly known as:  ATIVAN Take 1 tablet (0.5 mg total) by mouth every 6 (six) hours as needed (Nausea or vomiting).   magic mouthwash Soln Take 10 mLs by mouth 4 (four) times daily -  before meals and at bedtime.   metoprolol tartrate 25 MG tablet Commonly known as:  LOPRESSOR Take 1 tablet (25 mg total) by mouth 2 (two) times daily.   ondansetron 8 MG tablet Commonly known as:  ZOFRAN Take 1 tablet (8 mg total) by mouth 2 (two) times daily. What changed:    when to take this  reasons to take this   Oxycodone HCl 10 MG Tabs Take 1 tablet (10 mg total) by mouth every 6 (six) hours as needed for severe pain. What changed:  Another medication with  the same name was removed. Continue taking this medication, and follow the directions you see here.   prochlorperazine 10 MG tablet Commonly known as:  COMPAZINE Take 1 tablet (10 mg total) by mouth every 6 (six) hours as needed for nausea or vomiting.      No Known Allergies Follow-up Information    [facility] Follow up.   Specialty:  [place] Why:  home health services arranged, RN,PT Contact information: [address] [place] [address] [phone]        Volanda Napoleon, MD Follow up in 3 day(s).   Specialty:  Oncology Why:  please call the office to make an appointment. Contact information: [place] [address] [phone]        Adrian Prows, MD Follow up in 3 day(s).   Specialty:  Cardiology Why:  please call the office for appointment. Contact information: [address] [place] [address] [phone]            The results of significant diagnostics from this hospitalization (including imaging, microbiology, ancillary and laboratory) are listed below for reference.    Significant Diagnostic Studies: Ct Abdomen Pelvis Wo Contrast  Result Date: 12/09/2017 CLINICAL DATA:  Abdominal distension EXAM: CT ABDOMEN AND PELVIS WITHOUT CONTRAST TECHNIQUE: Multidetector CT imaging of the abdomen and pelvis was performed following the standard protocol without IV contrast. COMPARISON:  11/07/2017 FINDINGS: Lower chest: Small bilateral pleural effusions. Associated dependent atelectasis. Hepatobiliary: Multiple liver lesions are poorly defined on a noncontrast study. There is edema and thickening of the gallbladder wall. There is a small amount of free fluid about the liver. Pancreas: Mass in the tail of the pancreas is poorly defined. Spleen: Unremarkable. Adrenals/Urinary Tract: No hydronephrosis. Kidneys are within normal limits. Adrenal glands are within normal limits. Bladder  is unremarkable. Stomach/Bowel: There is no disproportionate dilatation of bowel to suggest obstruction. There is no pneumatosis. Stomach is decompressed. Normal appendix in the right lower quadrant. Vascular/Lymphatic: Scattered small para-aortic lymph nodes. Atherosclerotic calcifications of the aorta. No evidence of aortic aneurysm. Reproductive: Prostate is within normal limits. Other: There is free fluid within the pelvis as well as in both paracolic gutters. Musculoskeletal: There is a new T11 inferior endplate compression fracture. Pathologic fracture is not excluded. IMPRESSION: New T11 compression fracture. Small bilateral pleural effusions. Ascites. Liver lesions and pancreatic tail mass are again noted but less defined than on the prior study Gallbladder wall thickening and edema is nonspecific. No evidence of bowel obstruction. Electronically Signed   By: Rodena Goldmann.D.  On: 12/09/2017 12:09   Ct Head Wo Contrast  Result Date: 12/09/2017 CLINICAL DATA:  Altered level of consciousness EXAM: CT HEAD WITHOUT CONTRAST TECHNIQUE: Contiguous axial images were obtained from the base of the skull through the vertex without intravenous contrast. COMPARISON:  07/29/2017 FINDINGS: Brain: Chronic infarcts in both cerebellar hemispheres are again noted. Watershed infarcts in the bilateral frontal lobes and right parietal lobe are unchanged. There is no mass effect, midline shift, or acute hemorrhage. Global atrophy is also noted. Vascular: No hyperdense vessel or unexpected calcification. Skull: Cranium is intact. Sinuses/Orbits: Mastoid air cells are clear. Visualized paranasal sinuses are clear. Orbits are within normal limits. Other: Noncontributory. IMPRESSION: Chronic ischemic changes and atrophy. No acute intracranial pathology. Electronically Signed   By: Arthur  Hoss M.D.   On: 12/09/2017 12:04   Ct Angio Chest Pe W Or Wo Contrast  Result Date: 12/10/2017 CLINICAL DATA:  History of pancreatic  cancer and pulmonary emboli, on Lovenox. Atrial fibrillation. Assess response to Lovenox. EXAM: CT ANGIOGRAPHY CHEST WITH CONTRAST TECHNIQUE: Multidetector CT imaging of the chest was performed using the standard protocol during bolus administration of intravenous contrast. Multiplanar CT image reconstructions and MIPs were obtained to evaluate the vascular anatomy. CONTRAST:  100mL ISOVUE-370 IOPAMIDOL (ISOVUE-370) INJECTION 76% COMPARISON:  11/13/2017 FINDINGS: Cardiovascular: Previously seen left pulmonary emboli are no longer visualized. Embolus in the right lower lobe pulmonary artery is again noted, slightly decreased in size. New pulmonary embolus is now noted in the right upper lobe. 5 cm ascending thoracic aortic aneurysm again noted, compared with 4.8 cm previously. Scattered aortic calcifications. Heart is enlarged. Prior mitral valve replacement. Mediastinum/Nodes: No enlarged mediastinal, hilar or axillary lymph nodes. Lungs/Pleura: Small bilateral pleural effusions are new since prior study. Compressive atelectasis in the lower lobes bilaterally. Calcified pleural plaques are noted on the right, likely related to old hemothorax or empyema. Upper Abdomen: Numerous areas of low-density throughout the liver compatible with metastases as seen on prior study. Musculoskeletal: Right Port-A-Cath remains in place. Chest wall soft tissues otherwise unremarkable. Stable mild compression fracture through the inferior endplate at T11. Review of the MIP images confirms the above findings. IMPRESSION: Improvement in the clot burden within the left lung and in the right lower lobe. There is new pulmonary embolus in the right upper lobe. Small bilateral pleural effusions with compressive atelectasis in the lower lobes. Cardiomegaly. 5 cm ascending thoracic aortic aneurysm. Recommend semi-annual imaging followup by CTA or MRA and referral to cardiothoracic surgery if not already obtained. This recommendation follows  2010 ACCF/AHA/AATS/ACR/ASA/SCA/SCAI/SIR/STS/SVM Guidelines for the Diagnosis and Management of Patients With Thoracic Aortic Disease. Circulation. 2010; 121: e266-e369 Calcified pleural plaques on the right, stable. Numerous hepatic metastases again noted. Electronically Signed   By: Kevin  Dover M.D.   On: 12/10/2017 09:32   Dg Chest Portable 1 View  Result Date: 12/08/2017 CLINICAL DATA:  Altered mental status since yesterday. Patient had first chemotherapy treatment on Wednesday for pancreatic cancer with metastasis to the liver. EXAM: PORTABLE CHEST 1 VIEW COMPARISON:  11/07/2017 FINDINGS: Slightly low lung volumes. Bibasilar atelectasis is noted. There is no pleural effusion. No pulmonary consolidation or pulmonary edema. No aggressive osseous lesions. Port catheter is noted tip at the cavoatrial junction. Median sternotomy sutures are in place. Heart is top-normal in size. There is mild aortic atherosclerosis with uncoiling of the thoracic aorta. No acute osseous abnormality. IMPRESSION: Slightly low lung volumes since prior with bibasilar atelectasis. No active pulmonary disease. Aortic atherosclerosis. Electronically Signed   By:   Ashley Royalty M.D.   On: 12/08/2017 22:10   US Abdomen Limited Ruq  Result Date: 12/09/2017 CLINICAL DATA:  Sepsis.  Pancreatic carcinoma. EXAM: ULTRASOUND ABDOMEN LIMITED RIGHT UPPER QUADRANT COMPARISON:  CT on 11/07/2017 FINDINGS: Gallbladder: A small amount of echogenic sludge is seen in the gallbladder, however no definite gallstones are identified. Diffuse gallbladder wall thickening is seen measuring up to 14 mm which is nonspecific. No sonographic Murphy sign noted by sonographer. Common bile duct: Diameter: 5 mm, within normal limits. Liver: Numerous hypoechoic masses are seen throughout the liver, as seen on recent CT, consistent with diffuse liver metastases. Portal vein is patent on color Doppler imaging with normal direction of blood flow towards the liver.  IMPRESSION: Diffuse liver metastases. Gallbladder sludge and diffuse gallbladder wall thickening, without definite gallstones or sonographic Murphy's sign. This is nonspecific and may be due to hepatic disease, however acalculus cholecystitis cannot definitely be excluded. Electronically Signed   By: Earle Gell M.D.   On: 12/09/2017 08:56    Microbiology: Recent Results (from the past 240 hour(s))  Blood Culture (routine x 2)     Status: None (Preliminary result)   Collection Time: 12/08/17  9:30 PM  Result Value Ref Range Status   Specimen Description BLOOD LEFT HAND  Final   Special Requests   Final    BOTTLES DRAWN AEROBIC AND ANAEROBIC Blood Culture adequate volume   Culture   Final    NO GROWTH 4 DAYS Performed at [facility], [place] [address], [place], [place] [address]    Report Status PENDING  Incomplete  Blood Culture (routine x 2)     Status: None (Preliminary result)   Collection Time: 12/08/17  9:45 PM  Result Value Ref Range Status   Specimen Description BLOOD LEFT HAND  Final   Special Requests   Final    BOTTLES DRAWN AEROBIC AND ANAEROBIC Blood Culture adequate volume   Culture   Final    NO GROWTH 4 DAYS Performed at [facility], [place] [address], [place], [place] [address]    Report Status PENDING  Incomplete  MRSA PCR Screening     Status: None   Collection Time: 12/10/17  3:51 AM  Result Value Ref Range Status   MRSA by PCR NEGATIVE NEGATIVE Final    Comment:        The GeneXpert MRSA Assay (FDA approved for NASAL specimens only), is one component of a comprehensive MRSA colonization surveillance program. It is not intended to diagnose MRSA infection nor to guide or monitor treatment for MRSA infections. Performed at [facility], [place] [address] [place], [place] [address]      Labs: Basic Metabolic Panel: Recent Labs  Lab 12/09/17 [phone] 12/09/17 2979 12/10/17 0354 12/11/17 0438 12/12/17 0439 12/13/17 0354  NA 138  --  139 140  138 136  K 4.7  --  4.1 3.4* 3.7 3.9  CL 106  --  108 107 107 105  CO2 24  --  _0 GLUCOSE 159*  --  152* 123* 123* 131*  BUN 29*  --  25* 24* 19 21*  CREATININE 1.05  --  0.89 1.01 0.89 0.96  CALCIUM 8.2*  --  7.9* 7.9* 7.8* 7.6*  MG  --  2.0 1.8  --   --   --   PHOS  --  3.0 2.3*  --   --   --    Liver Function Tests:  Recent Labs  Lab 12/08/17 2009 12/10/17 0354 12/11/17 0438 12/12/17 0439 12/13/17 0354  AST 204* 123* 110* 73* 75*  ALT 131* 103* 103* 82* 72*  ALKPHOS 352* 294* 406* 468* 502*  BILITOT 5.4* 4.5* 6.0* 5.7* 5.6*  PROT 6.4* 5.3* 5.1* 4.8* 4.7*  ALBUMIN 2.6* 2.1* 2.0* 1.9* 1.8*   Recent Labs  Lab 12/08/17 2147  LIPASE 19   Recent Labs  Lab 12/08/17 2327  AMMONIA 30   CBC: Recent Labs  Lab 12/08/17 2009 12/09/17 0332 12/10/17 0354 12/11/17 1811 12/12/17 0439  WBC 16.7* 11.5* 8.2 11.0* 10.2  NEUTROABS  --   --  7.7  --   --   HGB 9.8* 8.5* 7.6* 9.5* 9.6*  HCT 30.3* 26.0* 23.6* 28.6* 28.8*  MCV 91.0 90.9 89.4 85.9 85.7  PLT 230 153 91* 39* 33*   Cardiac Enzymes: No results for input(s): CKTOTAL, CKMB, CKMBINDEX, TROPONINI in the last 168 hours. BNP: BNP (last 3 results) No results for input(s): BNP in the last 8760 hours.  ProBNP (last 3 results) No results for input(s): PROBNP in the last 8760 hours.  CBG: Recent Labs  Lab 12/08/17 2018  GLUCAP 158*       Signed:   N , MD [facility] 12/13/2017, 11:44 AM  

## 2017-12-13 NOTE — Progress Notes (Signed)
Nsg Discharge Note  Admit Date:  12/08/2017 Discharge date: 12/13/2017   Zakkery Dorian to be D/C'd Home per MD order.  AVS completed.  Copy for chart, and copy for patient signed, and dated. Patient/caregiver able to verbalize understanding.  Discharge Medication: Allergies as of 12/13/2017   No Known Allergies     Medication List    TAKE these medications   amiodarone 200 MG tablet Commonly known as:  PACERONE Take 1 tablet (200 mg total) by mouth daily.   aspirin 81 MG EC tablet Take 1 tablet (81 mg total) by mouth daily.   diltiazem 180 MG 24 hr capsule Commonly known as:  CARDIZEM CD Take 1 capsule (180 mg total) by mouth daily.   dronabinol 5 MG capsule Commonly known as:  MARINOL Take 1 capsule (5 mg total) by mouth 2 (two) times daily before lunch and supper.   enoxaparin 80 MG/0.8ML injection Commonly known as:  LOVENOX Inject 0.75 mLs (75 mg total) into the skin every 12 (twelve) hours.   lidocaine-prilocaine cream Commonly known as:  EMLA Apply to affected area once What changed:  Another medication with the same name was removed. Continue taking this medication, and follow the directions you see here.   lipase/protease/amylase 36000 UNITS Cpep capsule Commonly known as:  CREON Take 1 capsule (36,000 Units total) by mouth 3 (three) times daily before meals.   LORazepam 0.5 MG tablet Commonly known as:  ATIVAN Take 1 tablet (0.5 mg total) by mouth every 6 (six) hours as needed (Nausea or vomiting).   magic mouthwash Soln Take 10 mLs by mouth 4 (four) times daily -  before meals and at bedtime.   metoprolol tartrate 25 MG tablet Commonly known as:  LOPRESSOR Take 1 tablet (25 mg total) by mouth 2 (two) times daily.   ondansetron 8 MG tablet Commonly known as:  ZOFRAN Take 1 tablet (8 mg total) by mouth 2 (two) times daily. What changed:    when to take this  reasons to take this   Oxycodone HCl 10 MG Tabs Take 1 tablet (10 mg total) by mouth every 6  (six) hours as needed for severe pain. What changed:  Another medication with the same name was removed. Continue taking this medication, and follow the directions you see here.   prochlorperazine 10 MG tablet Commonly known as:  COMPAZINE Take 1 tablet (10 mg total) by mouth every 6 (six) hours as needed for nausea or vomiting.       Discharge Assessment: Vitals:   12/13/17 0830 12/13/17 1352  BP: 111/82 108/82  Pulse: 87 85  Resp:  14  Temp:  98.1 F (36.7 C)  SpO2:  95%   Skin clean, dry and intact without evidence of skin break down, no evidence of skin tears noted. IV catheter discontinued intact. Site without signs and symptoms of complications - no redness or edema noted at insertion site, patient denies c/o pain - only slight tenderness at site.  Dressing with slight pressure applied.  D/c Instructions-Education: Discharge instructions given to patient/family with verbalized understanding. D/c education completed with patient/family including follow up instructions, medication list, d/c activities limitations if indicated, with other d/c instructions as indicated by MD - patient able to verbalize understanding, all questions fully answered. Patient instructed to return to ED, call 911, or call MD for any changes in condition.  Patient escorted via [place], and D/C home via private auto.  Waiting for IV team to deaccess port a cath before going  home. Eliot Ford, RN 12/13/2017 3:07 PM

## 2017-12-13 NOTE — Care Management Important Message (Signed)
Important Message  Patient Details  Name: Jeff [name] MRN: [id number] Date of Birth: 09-15-1955   Medicare Important Message Given:  Yes    Lisa Milian Montine Circle 12/13/2017, 2:52 PM

## 2017-12-13 NOTE — Care Management Note (Addendum)
Case Management Note  Patient Details  Name: Jeff [name] MRN: [id number] Date of Birth: May 07, 1956  Subjective/Objective:    Presented with sepsis, a.fib RVR, hx ofHTN, A. 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 Resides with family. States owns a cane.  Durene Cal (Sister) Beaulah Corin (Daughter)   [facility]      [phone]                              PCP: Juluis Mire  Action/Plan: Transition to home with home health services to follow. Pt states has transportation to home.  Expected Discharge Date:    12/13/2017             Expected Discharge Plan:  [place]  In-House Referral:     Discharge planning Services  CM Consult  Post Acute Care Choice:    Choice offered to:  Patient  DME Arranged:    DME Agency:     HH Arranged:  RN, PT [place] Agency:  [place], pending MD's order. MD aware order needed.  Status of Service:  Completed, signed off  If discussed at [place] of Stay Meetings, dates discussed:    Additional Comments:  Sharin Mons, RN 12/13/2017, 11:38 AM

## 2017-12-14 ENCOUNTER — Telehealth: Payer: Self-pay | Admitting: *Deleted

## 2017-12-14 NOTE — Telephone Encounter (Signed)
Patient recently discharged from the hospital for multiple reasons including bilateral DVTs. Daughter is calling the office with concerns of moderate bilateral swelling of the feet.   Reviewed with Dr Marin Olp. Swelling of the feet is expected with the bilateral DVTs and well as his recent hospitalization with days worth of IVF. Patient is to keep legs elevated, try to eat well, including high proteins, and drink plenty of fluids.    Attempted to call daughter back several times, with message left x one. Never made verbal contact. Patient has appointment here on Monday. Will follow up at that time.

## 2017-12-17 ENCOUNTER — Inpatient Hospital Stay: Payer: Medicare Other

## 2017-12-17 ENCOUNTER — Inpatient Hospital Stay (HOSPITAL_BASED_OUTPATIENT_CLINIC_OR_DEPARTMENT_OTHER): Payer: Medicare Other | Admitting: Hematology & Oncology

## 2017-12-17 ENCOUNTER — Other Ambulatory Visit: Payer: Self-pay

## 2017-12-17 ENCOUNTER — Telehealth: Payer: Self-pay | Admitting: *Deleted

## 2017-12-17 ENCOUNTER — Inpatient Hospital Stay: Payer: Medicare Other | Attending: Hematology & Oncology

## 2017-12-17 VITALS — BP 97/68 | HR 85 | Temp 98.2°F | Resp 16 | Wt 168.8 lb

## 2017-12-17 DIAGNOSIS — R531 Weakness: Secondary | ICD-10-CM | POA: Insufficient documentation

## 2017-12-17 DIAGNOSIS — Z86718 Personal history of other venous thrombosis and embolism: Secondary | ICD-10-CM | POA: Insufficient documentation

## 2017-12-17 DIAGNOSIS — D500 Iron deficiency anemia secondary to blood loss (chronic): Secondary | ICD-10-CM

## 2017-12-17 DIAGNOSIS — R17 Unspecified jaundice: Secondary | ICD-10-CM | POA: Insufficient documentation

## 2017-12-17 DIAGNOSIS — Z95828 Presence of other vascular implants and grafts: Secondary | ICD-10-CM

## 2017-12-17 DIAGNOSIS — Z86711 Personal history of pulmonary embolism: Secondary | ICD-10-CM | POA: Diagnosis not present

## 2017-12-17 DIAGNOSIS — C787 Secondary malignant neoplasm of liver and intrahepatic bile duct: Secondary | ICD-10-CM

## 2017-12-17 DIAGNOSIS — M7989 Other specified soft tissue disorders: Secondary | ICD-10-CM

## 2017-12-17 DIAGNOSIS — Z79899 Other long term (current) drug therapy: Secondary | ICD-10-CM

## 2017-12-17 DIAGNOSIS — R5383 Other fatigue: Secondary | ICD-10-CM | POA: Diagnosis not present

## 2017-12-17 DIAGNOSIS — E41 Nutritional marasmus: Secondary | ICD-10-CM | POA: Diagnosis not present

## 2017-12-17 DIAGNOSIS — C259 Malignant neoplasm of pancreas, unspecified: Secondary | ICD-10-CM

## 2017-12-17 DIAGNOSIS — Z7901 Long term (current) use of anticoagulants: Secondary | ICD-10-CM | POA: Insufficient documentation

## 2017-12-17 DIAGNOSIS — R978 Other abnormal tumor markers: Secondary | ICD-10-CM | POA: Insufficient documentation

## 2017-12-17 DIAGNOSIS — C251 Malignant neoplasm of body of pancreas: Secondary | ICD-10-CM

## 2017-12-17 DIAGNOSIS — C252 Malignant neoplasm of tail of pancreas: Secondary | ICD-10-CM

## 2017-12-17 DIAGNOSIS — R0602 Shortness of breath: Secondary | ICD-10-CM | POA: Insufficient documentation

## 2017-12-17 LAB — CBC WITH DIFFERENTIAL (CANCER CENTER ONLY)
Basophils Absolute: 0.1 10*3/uL (ref 0.0–0.1)
Basophils Relative: 1 %
EOS ABS: 0.2 10*3/uL (ref 0.0–0.5)
EOS PCT: 2 %
HCT: 32.1 % — ABNORMAL LOW (ref 38.7–49.9)
HEMOGLOBIN: 10.5 g/dL — AB (ref 13.0–17.1)
LYMPHS ABS: 1.1 10*3/uL (ref 0.9–3.3)
Lymphocytes Relative: 12 %
MCH: 30.3 pg (ref 28.0–33.4)
MCHC: 32.7 g/dL (ref 32.0–35.9)
MCV: 92.5 fL (ref 82.0–98.0)
MONOS PCT: 24 %
Monocytes Absolute: 2.2 10*3/uL — ABNORMAL HIGH (ref 0.1–0.9)
NEUTROS PCT: 61 %
Neutro Abs: 5.6 10*3/uL (ref 1.5–6.5)
Platelet Count: 667 10*3/uL — ABNORMAL HIGH (ref 145–400)
RBC: 3.47 MIL/uL — ABNORMAL LOW (ref 4.20–5.70)
RDW: 20.7 % — ABNORMAL HIGH (ref 11.1–15.7)
WBC Count: 9.1 10*3/uL (ref 4.0–10.0)

## 2017-12-17 LAB — CMP (CANCER CENTER ONLY)
ALT: 49 U/L — ABNORMAL HIGH (ref 10–47)
ANION GAP: 4 — AB (ref 5–15)
AST: 60 U/L — ABNORMAL HIGH (ref 11–38)
Albumin: 2.5 g/dL — ABNORMAL LOW (ref 3.5–5.0)
Alkaline Phosphatase: 680 U/L — ABNORMAL HIGH (ref 26–84)
BUN: 16 mg/dL (ref 7–22)
CHLORIDE: 109 mmol/L — AB (ref 98–108)
CO2: 27 mmol/L (ref 18–33)
Calcium: 8.2 mg/dL (ref 8.0–10.3)
Creatinine: 1.1 mg/dL (ref 0.60–1.20)
Glucose, Bld: 128 mg/dL — ABNORMAL HIGH (ref 73–118)
POTASSIUM: 3.8 mmol/L (ref 3.3–4.7)
SODIUM: 140 mmol/L (ref 128–145)
Total Bilirubin: 5 mg/dL (ref 0.2–1.6)
Total Protein: 5.4 g/dL — ABNORMAL LOW (ref 6.4–8.1)

## 2017-12-17 MED ORDER — SODIUM CHLORIDE 0.9% FLUSH
10.0000 mL | INTRAVENOUS | Status: DC | PRN
  Administered 2017-12-17: 10 mL via INTRAVENOUS
  Filled 2017-12-17: qty 10

## 2017-12-17 MED ORDER — HEPARIN SOD (PORK) LOCK FLUSH 100 UNIT/ML IV SOLN
500.0000 [IU] | Freq: Once | INTRAVENOUS | Status: AC
  Administered 2017-12-17: 500 [IU] via INTRAVENOUS
  Filled 2017-12-17: qty 5

## 2017-12-17 MED ORDER — MORPHINE SULFATE ER 15 MG PO TBCR: 15.0000 mg | EXTENDED_RELEASE_TABLET | Freq: Two times a day (BID) | ORAL | 0 refills | Status: AC

## 2017-12-17 NOTE — Progress Notes (Signed)
Hematology and Oncology Follow Up Visit  Jeff [name] [id number] October 19, 1955 62 y.o. 12/17/2017   Principle Diagnosis:   Metastatic adenocarcinoma of the pancreas-liver metastasis  Hypercoagulable state with pulmonary embolism/lower extremity DVT  Current Therapy:    Gemzar/Abraxane - cycle #1 to start on 12/05/2017  Lovenox 80 mg sq BID     Interim History:  Jeff [name] is back for follow-up.  Unfortunately, he was recently hospitalized because of rapid atrial fibrillation.  He is now on amiodarone.  This is helping quite a bit.  He is in a normal sinus rhythm right now.  He is on Lovenox.  When he was in the hospital, he did have a CT angiogram of the chest that did show improvement and his embolic burden in the lungs.  He was transfused in the hospital.  He clearly has progressive disease.  He only had one dose of chemotherapy.  He gets Gemzar/Abraxane.  In the hospital, his CA 19-9 was 766,000.    His appetite is improving a little bit.  He has had no nausea or vomiting.  He has had no fever.  He does have some leg swelling.  I told him to try some compression stockings for his legs.  He has had no diarrhea.  We really need to try to get him back onto treatment.  Because of his insurance, he has not been able to get OxyContin.  I will see if we can get MS Contin for him (15 mg p.o. twice daily).  He has had some iron in the past while he was in the hospital.  Overall, his performance status is ECOG 2.  Medications:  Current Outpatient Medications:  .  amiodarone (PACERONE) 200 MG tablet, Take 1 tablet (200 mg total) by mouth daily., Disp: 30 tablet, Rfl: 0 .  aspirin EC 81 MG EC tablet, Take 1 tablet (81 mg total) by mouth daily., Disp: , Rfl:  .  diltiazem (CARDIZEM CD) 180 MG 24 hr capsule, Take 1 capsule (180 mg total) by mouth daily., Disp: 30 capsule, Rfl: 1 .  dronabinol (MARINOL) 5 MG capsule, Take 1 capsule (5 mg total) by mouth 2 (two) times daily before lunch and  supper., Disp: 60 capsule, Rfl: 0 .  enoxaparin (LOVENOX) 80 MG/0.8ML injection, Inject 0.75 mLs (75 mg total) into the skin every 12 (twelve) hours., Disp: 60 Syringe, Rfl: 2 .  lidocaine-prilocaine (EMLA) cream, Apply to affected area once, Disp: 30 g, Rfl: 3 .  lipase/protease/amylase (CREON) 36000 UNITS CPEP capsule, Take 1 capsule (36,000 Units total) by mouth 3 (three) times daily before meals., Disp: 270 capsule, Rfl: 1 .  LORazepam (ATIVAN) 0.5 MG tablet, Take 1 tablet (0.5 mg total) by mouth every 6 (six) hours as needed (Nausea or vomiting)., Disp: 30 tablet, Rfl: 0 .  magic mouthwash SOLN, Take 10 mLs by mouth 4 (four) times daily -  before meals and at bedtime., Disp: 50 mL, Rfl: 0 .  metoprolol tartrate (LOPRESSOR) 25 MG tablet, Take 1 tablet (25 mg total) by mouth 2 (two) times daily., Disp: 60 tablet, Rfl: 1 .  morphine (MS CONTIN) 15 MG 12 hr tablet, Take 1 tablet (15 mg total) by mouth every 12 (twelve) hours., Disp: 60 tablet, Rfl: 0 .  ondansetron (ZOFRAN) 8 MG tablet, Take 1 tablet (8 mg total) by mouth 2 (two) times daily. (Patient taking differently: Take 8 mg by mouth every 8 (eight) hours as needed. ), Disp: 30 tablet, Rfl: 1 .  oxyCODONE 10  MG TABS, Take 1 tablet (10 mg total) by mouth every 6 (six) hours as needed for severe pain., Disp: 90 tablet, Rfl: 0 .  prochlorperazine (COMPAZINE) 10 MG tablet, Take 1 tablet (10 mg total) by mouth every 6 (six) hours as needed for nausea or vomiting., Disp: 30 tablet, Rfl: 1  Allergies: No Known Allergies  Past Medical History, Surgical history, Social history, and Family History were reviewed and updated.  Review of Systems: Review of Systems  Constitutional: Positive for appetite change and fatigue.  HENT:  Negative.   Eyes: Negative.   Respiratory: Positive for shortness of breath.   Cardiovascular: Positive for leg swelling.  Gastrointestinal: Positive for abdominal pain, constipation and nausea.  Endocrine: Negative.     Genitourinary: Negative.    Musculoskeletal: Positive for back pain and myalgias.  Skin: Negative.   Neurological: Negative.   Hematological: Negative.   Psychiatric/Behavioral: Negative.     Physical Exam:  vitals were not taken for this visit.   Wt Readings from Last 3 Encounters:  12/17/17 168 lb 12 oz (76.5 kg)  12/13/17 166 lb 14.2 oz (75.7 kg)  11/30/17 160 lb (72.6 kg)    Physical Exam  Constitutional: He is oriented to person, place, and time.  HENT:  Head: Normocephalic and atraumatic.  Mouth/Throat: Oropharynx is clear and moist.  Eyes: Pupils are equal, round, and reactive to light. EOM are normal.  Neck: Normal range of motion.  Cardiovascular: Normal rate, regular rhythm and normal heart sounds.  Pulmonary/Chest: Effort normal and breath sounds normal.  Abdominal: Soft. Bowel sounds are normal.  Musculoskeletal: Normal range of motion. He exhibits no edema, tenderness or deformity.  Lymphadenopathy:    He has no cervical adenopathy.  Neurological: He is alert and oriented to person, place, and time.  Skin: Skin is warm and dry. No rash noted. No erythema.  Psychiatric: He has a normal mood and affect. His behavior is normal. Judgment and thought content normal.  Vitals reviewed.    Lab Results  Component Value Date   WBC 9.1 12/17/2017   HGB 10.5 (L) 12/17/2017   HCT 32.1 (L) 12/17/2017   MCV 92.5 12/17/2017   PLT 667 (H) 12/17/2017     Chemistry      Component Value Date/Time   NA 140 12/17/2017 1440   K 3.8 12/17/2017 1440   CL 109 (H) 12/17/2017 1440   CO2 27 12/17/2017 1440   BUN 16 12/17/2017 1440   CREATININE 1.10 12/17/2017 1440      Component Value Date/Time   CALCIUM 8.2 12/17/2017 1440   ALKPHOS 680 (H) 12/17/2017 1440   AST 60 (H) 12/17/2017 1440   ALT 49 (H) 12/17/2017 1440   BILITOT 5.0 (HH) 12/17/2017 1440       Impression and Plan: [name] is a 62 year old African-American male with metastatic pancreatic cancer.  He  has extensive hepatic metastasis.  He has an incredibly elevated CA 19-9.  Hopefully, we will be able to get him on treatment and not have any breaks.  We will get him back on Wednesday for day #8 of cycle 1 of Gemzar/Abraxane.  I will then give him a week off.  I think this might be helpful for right now.  We will then plan to get him back on May 22 for cycle 2 of treatment.  I think we will obviously be able to use the CA 19-9 to tell whether or not he is responding.     Volanda Napoleon,  MD 5/6/20194:56 PM

## 2017-12-17 NOTE — Patient Instructions (Signed)
Implanted Port Home Guide An implanted port is a type of central line that is placed under the skin. Central lines are used to provide IV access when treatment or nutrition needs to be given through a person's veins. Implanted ports are used for long-term IV access. An implanted port may be placed because:  You need IV medicine that would be irritating to the small veins in your hands or arms.  You need long-term IV medicines, such as antibiotics.  You need IV nutrition for a long period.  You need frequent blood draws for lab tests.  You need dialysis.  Implanted ports are usually placed in the chest area, but they can also be placed in the upper arm, the abdomen, or the leg. An implanted port has two main parts:  Reservoir. The reservoir is round and will appear as a small, raised area under your skin. The reservoir is the part where a needle is inserted to give medicines or draw blood.  Catheter. The catheter is a thin, flexible tube that extends from the reservoir. The catheter is placed into a large vein. Medicine that is inserted into the reservoir goes into the catheter and then into the vein.  How will I care for my incision site? Do not get the incision site wet. Bathe or shower as directed by your health care provider. How is my port accessed? Special steps must be taken to access the port:  Before the port is accessed, a numbing cream can be placed on the skin. This helps numb the skin over the port site.  Your health care provider uses a sterile technique to access the port. ? Your health care provider must put on a mask and sterile gloves. ? The skin over your port is cleaned carefully with an antiseptic and allowed to dry. ? The port is gently pinched between sterile gloves, and a needle is inserted into the port.  Only "non-coring" port needles should be used to access the port. Once the port is accessed, a blood return should be checked. This helps ensure that the port  is in the vein and is not clogged.  If your port needs to remain accessed for a constant infusion, a clear (transparent) bandage will be placed over the needle site. The bandage and needle will need to be changed every week, or as directed by your health care provider.  Keep the bandage covering the needle clean and dry. Do not get it wet. Follow your health care provider's instructions on how to take a shower or bath while the port is accessed.  If your port does not need to stay accessed, no bandage is needed over the port.  What is flushing? Flushing helps keep the port from getting clogged. Follow your health care provider's instructions on how and when to flush the port. Ports are usually flushed with saline solution or a medicine called heparin. The need for flushing will depend on how the port is used.  If the port is used for intermittent medicines or blood draws, the port will need to be flushed: ? After medicines have been given. ? After blood has been drawn. ? As part of routine maintenance.  If a constant infusion is running, the port may not need to be flushed.  How long will my port stay implanted? The port can stay in for as long as your health care provider thinks it is needed. When it is time for the port to come out, surgery will be   done to remove it. The procedure is similar to the one performed when the port was put in. When should I seek immediate medical care? When you have an implanted port, you should seek immediate medical care if:  You notice a bad smell coming from the incision site.  You have swelling, redness, or drainage at the incision site.  You have more swelling or pain at the port site or the surrounding area.  You have a fever that is not controlled with medicine.  This information is not intended to replace advice given to you by your health care provider. Make sure you discuss any questions you have with your health care provider. Document  Released: 07/31/2005 Document Revised: 01/06/2016 Document Reviewed: 04/07/2013 Elsevier Interactive Patient Education  2017 Elsevier Inc.  

## 2017-12-17 NOTE — Telephone Encounter (Signed)
Dr. Marin Olp notified of bilirubin of 5.0.

## 2017-12-18 LAB — IRON AND TIBC
IRON: 34 ug/dL — AB (ref 42–163)
Saturation Ratios: 25 % — ABNORMAL LOW (ref 42–163)
TIBC: 134 ug/dL — AB (ref 202–409)
UIBC: 100 ug/dL

## 2017-12-18 LAB — CANCER ANTIGEN 19-9: CA 19-9: 325003 U/mL — ABNORMAL HIGH (ref 0–35)

## 2017-12-18 LAB — FERRITIN: Ferritin: 2518 ng/mL — ABNORMAL HIGH (ref 22–316)

## 2017-12-19 ENCOUNTER — Ambulatory Visit: Payer: Medicare Other

## 2017-12-24 ENCOUNTER — Other Ambulatory Visit: Payer: Self-pay

## 2017-12-24 DIAGNOSIS — C252 Malignant neoplasm of tail of pancreas: Secondary | ICD-10-CM

## 2017-12-25 ENCOUNTER — Inpatient Hospital Stay: Payer: Medicare Other

## 2017-12-25 ENCOUNTER — Other Ambulatory Visit: Payer: Self-pay

## 2017-12-25 ENCOUNTER — Telehealth: Payer: Self-pay | Admitting: *Deleted

## 2017-12-25 ENCOUNTER — Ambulatory Visit (HOSPITAL_BASED_OUTPATIENT_CLINIC_OR_DEPARTMENT_OTHER)
Admission: RE | Admit: 2017-12-25 | Discharge: 2017-12-25 | Disposition: A | Discharge status: Home / Self Care | Payer: Medicare Other | Source: Ambulatory Visit | Attending: Hematology & Oncology | Admitting: Hematology & Oncology

## 2017-12-25 VITALS — BP 110/70 | HR 86 | Temp 97.3°F | Resp 18 | Wt 164.1 lb

## 2017-12-25 DIAGNOSIS — R0602 Shortness of breath: Secondary | ICD-10-CM | POA: Insufficient documentation

## 2017-12-25 DIAGNOSIS — Z8507 Personal history of malignant neoplasm of pancreas: Secondary | ICD-10-CM | POA: Insufficient documentation

## 2017-12-25 DIAGNOSIS — Z86711 Personal history of pulmonary embolism: Secondary | ICD-10-CM | POA: Diagnosis not present

## 2017-12-25 DIAGNOSIS — C259 Malignant neoplasm of pancreas, unspecified: Secondary | ICD-10-CM | POA: Diagnosis not present

## 2017-12-25 DIAGNOSIS — C252 Malignant neoplasm of tail of pancreas: Secondary | ICD-10-CM

## 2017-12-25 DIAGNOSIS — J90 Pleural effusion, not elsewhere classified: Secondary | ICD-10-CM | POA: Insufficient documentation

## 2017-12-25 DIAGNOSIS — C251 Malignant neoplasm of body of pancreas: Secondary | ICD-10-CM

## 2017-12-25 LAB — CBC WITH DIFFERENTIAL (CANCER CENTER ONLY)
BASOS PCT: 0 %
Basophils Absolute: 0 10*3/uL (ref 0.0–0.1)
Eosinophils Absolute: 0.3 10*3/uL (ref 0.0–0.5)
Eosinophils Relative: 1 %
HEMATOCRIT: 32.7 % — AB (ref 38.7–49.9)
Hemoglobin: 10.7 g/dL — ABNORMAL LOW (ref 13.0–17.1)
LYMPHS ABS: 1.3 10*3/uL (ref 0.9–3.3)
Lymphocytes Relative: 5 %
MCH: 31.2 pg (ref 28.0–33.4)
MCHC: 32.7 g/dL (ref 32.0–35.9)
MCV: 95.3 fL (ref 82.0–98.0)
MONO ABS: 2.3 10*3/uL — AB (ref 0.1–0.9)
MONOS PCT: 9 %
Metamyelocytes Relative: 2 %
Myelocytes: 1 %
NEUTROS ABS: 21.7 10*3/uL — AB (ref 1.5–6.5)
Neutrophils Relative %: 82 %
Platelet Count: 408 10*3/uL — ABNORMAL HIGH (ref 145–400)
RBC: 3.43 MIL/uL — ABNORMAL LOW (ref 4.20–5.70)
RDW: 22.8 % — ABNORMAL HIGH (ref 11.1–15.7)
WBC Count: 25.6 10*3/uL — ABNORMAL HIGH (ref 4.0–10.0)

## 2017-12-25 LAB — COMPREHENSIVE METABOLIC PANEL
ALK PHOS: 762 U/L — AB (ref 26–84)
ALT: 46 U/L (ref 10–47)
ANION GAP: 6 (ref 5–15)
AST: 86 U/L — ABNORMAL HIGH (ref 11–38)
Albumin: 2.5 g/dL — ABNORMAL LOW (ref 3.5–5.0)
BILIRUBIN TOTAL: 5.6 mg/dL — AB (ref 0.2–1.6)
BUN: 15 mg/dL (ref 7–22)
CHLORIDE: 108 mmol/L (ref 98–108)
CO2: 27 mmol/L (ref 18–33)
CREATININE: 1.3 mg/dL — AB (ref 0.60–1.20)
Calcium: 8.9 mg/dL (ref 8.0–10.3)
Glucose, Bld: 125 mg/dL — ABNORMAL HIGH (ref 73–118)
Potassium: 4.2 mmol/L (ref 3.3–4.7)
Sodium: 141 mmol/L (ref 128–145)
Total Protein: 5.8 g/dL — ABNORMAL LOW (ref 6.4–8.1)

## 2017-12-25 MED ORDER — HEPARIN SOD (PORK) LOCK FLUSH 100 UNIT/ML IV SOLN
500.0000 [IU] | Freq: Once | INTRAVENOUS | Status: AC
  Administered 2017-12-25: 500 [IU] via INTRAVENOUS
  Filled 2017-12-25: qty 5

## 2017-12-25 MED ORDER — SODIUM CHLORIDE 0.9% FLUSH
10.0000 mL | INTRAVENOUS | Status: DC | PRN
  Administered 2017-12-25: 10 mL via INTRAVENOUS
  Filled 2017-12-25: qty 10

## 2017-12-25 NOTE — Progress Notes (Signed)
Dr. Marin Olp notified of CBC and CMET results.  Pt taken to radiology for CXR per order of Dr. Marin Olp.

## 2017-12-25 NOTE — Telephone Encounter (Signed)
Critical Value Total Bili 5.6 Dr Ennever notified. No orders at this time 

## 2017-12-26 ENCOUNTER — Ambulatory Visit (HOSPITAL_COMMUNITY)
Admission: RE | Admit: 2017-12-26 | Discharge: 2017-12-26 | Disposition: A | Discharge status: Home / Self Care | Payer: Medicare Other | Source: Ambulatory Visit | Attending: Hematology & Oncology | Admitting: Hematology & Oncology

## 2017-12-26 ENCOUNTER — Other Ambulatory Visit: Payer: Self-pay | Admitting: Hematology & Oncology

## 2017-12-26 ENCOUNTER — Ambulatory Visit (HOSPITAL_COMMUNITY)
Admission: RE | Admit: 2017-12-26 | Discharge: 2017-12-26 | Disposition: A | Discharge status: Home / Self Care | Payer: Medicare Other | Source: Ambulatory Visit | Attending: Student | Admitting: Student

## 2017-12-26 DIAGNOSIS — R188 Other ascites: Secondary | ICD-10-CM | POA: Insufficient documentation

## 2017-12-26 DIAGNOSIS — R0602 Shortness of breath: Secondary | ICD-10-CM | POA: Insufficient documentation

## 2017-12-26 DIAGNOSIS — C251 Malignant neoplasm of body of pancreas: Secondary | ICD-10-CM

## 2017-12-26 DIAGNOSIS — Z9889 Other specified postprocedural states: Secondary | ICD-10-CM | POA: Insufficient documentation

## 2017-12-26 MED ORDER — LIDOCAINE HCL 1 % IJ SOLN
INTRAMUSCULAR | Status: AC
  Filled 2017-12-26: qty 20

## 2017-12-26 NOTE — Progress Notes (Signed)
Patient was also scheduled for possible paracentesis.  His most recent CT scan did show abdominal ascites.   Limited US Abdomen shows only a small amount of abdominal ascites.  He does have a large collection of fluid in the deep pelvis which is not likely amenable to paracentesis today.  Patient with 1.1 L of fluid removed from pleural space.  After discussion, his only complaint at this time is shortness of breath which may be improved with thoracentesis alone.  Based on symptoms and small amount of fluid visualized, paracentesis deferred at this time.  Patient agreeable and understands that a paracentesis can be reordered if abdomen becomes distended or he becomes symptomatic.    Brynda Greathouse, MS RD PA-C 1:35 PM

## 2017-12-26 NOTE — Procedures (Signed)
PROCEDURE SUMMARY:  Successful US guided right diagnostic and therapeutic thoracentesis. Yielded 1.1 liters of cloudy, yellow fluid. Pt tolerated procedure well. No immediate complications.  Specimen was sent for labs. CXR ordered.  Docia Barrier PA-C 12/26/2017 12:01 PM

## 2017-12-31 ENCOUNTER — Other Ambulatory Visit: Payer: Self-pay | Admitting: *Deleted

## 2017-12-31 DIAGNOSIS — C251 Malignant neoplasm of body of pancreas: Secondary | ICD-10-CM

## 2018-01-01 ENCOUNTER — Telehealth: Payer: Self-pay | Admitting: *Deleted

## 2018-01-01 NOTE — Telephone Encounter (Signed)
Jeff [name], patient's physical therapist, updating [place] on patient condition this morning. He is extremely weak. He is not eating much and family reports difficulty getting patient to take his medications. His vitals are stable, but he had 3+ pitting edema to BLE, and his extremities. The daughter has mentioned taking him to the hospital, but the patient doesn't want to go.  The patient has a scheduled appointment in this office tomorrow. The family feels like they are able to get him here. We will assess his needs at that time. Orders for bedside commode and incontinence supplies sent to [facility] yesterday. The PT is also requesting an order for hospital bed, this will be requested of the provider at [facility] visit. If the daughter feels that she cannot care for patient due to symptoms, she is instructed to bring patient to the ED. Jeff [name] will communicate the above with daughter.

## 2018-01-02 ENCOUNTER — Inpatient Hospital Stay: Payer: Medicare Other

## 2018-01-02 ENCOUNTER — Encounter: Payer: Self-pay | Admitting: Hematology & Oncology

## 2018-01-02 ENCOUNTER — Other Ambulatory Visit: Payer: Self-pay

## 2018-01-02 ENCOUNTER — Inpatient Hospital Stay (HOSPITAL_BASED_OUTPATIENT_CLINIC_OR_DEPARTMENT_OTHER): Payer: Medicare Other | Admitting: Hematology & Oncology

## 2018-01-02 ENCOUNTER — Telehealth: Payer: Self-pay | Admitting: *Deleted

## 2018-01-02 VITALS — BP 108/73 | HR 95 | Temp 97.6°F | Resp 24

## 2018-01-02 VITALS — BP 106/77 | HR 94 | Resp 19

## 2018-01-02 DIAGNOSIS — R978 Other abnormal tumor markers: Secondary | ICD-10-CM | POA: Diagnosis not present

## 2018-01-02 DIAGNOSIS — E41 Nutritional marasmus: Secondary | ICD-10-CM | POA: Diagnosis not present

## 2018-01-02 DIAGNOSIS — Z86711 Personal history of pulmonary embolism: Secondary | ICD-10-CM | POA: Diagnosis not present

## 2018-01-02 DIAGNOSIS — C787 Secondary malignant neoplasm of liver and intrahepatic bile duct: Secondary | ICD-10-CM | POA: Diagnosis not present

## 2018-01-02 DIAGNOSIS — R531 Weakness: Secondary | ICD-10-CM

## 2018-01-02 DIAGNOSIS — R17 Unspecified jaundice: Secondary | ICD-10-CM | POA: Diagnosis not present

## 2018-01-02 DIAGNOSIS — C259 Malignant neoplasm of pancreas, unspecified: Secondary | ICD-10-CM

## 2018-01-02 DIAGNOSIS — R0602 Shortness of breath: Secondary | ICD-10-CM

## 2018-01-02 DIAGNOSIS — R5383 Other fatigue: Secondary | ICD-10-CM | POA: Diagnosis not present

## 2018-01-02 DIAGNOSIS — Z86718 Personal history of other venous thrombosis and embolism: Secondary | ICD-10-CM | POA: Diagnosis not present

## 2018-01-02 DIAGNOSIS — Z7901 Long term (current) use of anticoagulants: Secondary | ICD-10-CM

## 2018-01-02 DIAGNOSIS — M7989 Other specified soft tissue disorders: Secondary | ICD-10-CM | POA: Diagnosis not present

## 2018-01-02 DIAGNOSIS — D500 Iron deficiency anemia secondary to blood loss (chronic): Secondary | ICD-10-CM

## 2018-01-02 DIAGNOSIS — C251 Malignant neoplasm of body of pancreas: Secondary | ICD-10-CM

## 2018-01-02 DIAGNOSIS — Z79899 Other long term (current) drug therapy: Secondary | ICD-10-CM | POA: Diagnosis not present

## 2018-01-02 DIAGNOSIS — C252 Malignant neoplasm of tail of pancreas: Secondary | ICD-10-CM

## 2018-01-02 DIAGNOSIS — C253 Malignant neoplasm of pancreatic duct: Secondary | ICD-10-CM

## 2018-01-02 LAB — CBC WITH DIFFERENTIAL (CANCER CENTER ONLY)
Basophils Absolute: 0 10*3/uL (ref 0.0–0.1)
Basophils Relative: 0 %
EOS ABS: 0.1 10*3/uL (ref 0.0–0.5)
Eosinophils Relative: 1 %
HCT: 34.2 % — ABNORMAL LOW (ref 38.7–49.9)
HEMOGLOBIN: 11.3 g/dL — AB (ref 13.0–17.1)
LYMPHS ABS: 0.7 10*3/uL — AB (ref 0.9–3.3)
Lymphocytes Relative: 3 %
MCH: 31.8 pg (ref 28.0–33.4)
MCHC: 33 g/dL (ref 32.0–35.9)
MCV: 96.3 fL (ref 82.0–98.0)
MONOS PCT: 9 %
Monocytes Absolute: 2.2 10*3/uL — ABNORMAL HIGH (ref 0.1–0.9)
Neutro Abs: 22.2 10*3/uL — ABNORMAL HIGH (ref 1.5–6.5)
Neutrophils Relative %: 87 %
Platelet Count: 156 10*3/uL (ref 145–400)
RBC: 3.55 MIL/uL — AB (ref 4.20–5.70)
RDW: 23.8 % — ABNORMAL HIGH (ref 11.1–15.7)
WBC: 25.3 10*3/uL — AB (ref 4.0–10.0)

## 2018-01-02 LAB — CMP (CANCER CENTER ONLY)
ALBUMIN: 2.2 g/dL — AB (ref 3.5–5.0)
ALT: 57 U/L — AB (ref 10–47)
AST: 124 U/L — ABNORMAL HIGH (ref 11–38)
Alkaline Phosphatase: 504 U/L — ABNORMAL HIGH (ref 26–84)
Anion gap: 8 (ref 5–15)
BUN: 31 mg/dL — AB (ref 7–22)
CHLORIDE: 107 mmol/L (ref 98–108)
CO2: 22 mmol/L (ref 18–33)
CREATININE: 1.6 mg/dL — AB (ref 0.60–1.20)
Calcium: 8.7 mg/dL (ref 8.0–10.3)
GLUCOSE: 131 mg/dL — AB (ref 73–118)
Potassium: 4.5 mmol/L (ref 3.3–4.7)
SODIUM: 137 mmol/L (ref 128–145)
TOTAL PROTEIN: 5.6 g/dL — AB (ref 6.4–8.1)
Total Bilirubin: 12.6 mg/dL (ref 0.2–1.6)

## 2018-01-02 LAB — LACTATE DEHYDROGENASE: LDH: 690 U/L — ABNORMAL HIGH (ref 125–245)

## 2018-01-02 MED ORDER — MORPHINE SULFATE 4 MG/ML IJ SOLN
4.0000 mg | Freq: Once | INTRAMUSCULAR | Status: AC
Start: 2018-01-02 — End: 2018-01-02
  Administered 2018-01-02: 4 mg via INTRAVENOUS
  Filled 2018-01-02: qty 1

## 2018-01-02 MED ORDER — MORPHINE SULFATE (PF) 4 MG/ML IV SOLN
INTRAVENOUS | Status: AC
  Filled 2018-01-02: qty 1

## 2018-01-02 NOTE — Telephone Encounter (Signed)
Critical Value Total Bilirubin 12.6 Dr Marin Olp notified. No orders at this time

## 2018-01-02 NOTE — Patient Instructions (Signed)
Morphine injection solution What is this medicine? MORPHINE (MOR feen) is a pain reliever. It is used to treat moderate to severe pain. This medicine may be used for other purposes; ask your health care provider or pharmacist if you have questions. COMMON BRAND NAME(S): Astramorph PF, Duramorph, Duramorph PF, Infumorph What should I tell my health care provider before I take this medicine? They need to know if you have any of these conditions: -brain tumor -drug abuse or addiction -head injury -heart disease -if you often drink alcohol -liver disease -lung or breathing disease, like asthma -problems urinating -seizures -stomach or intestine problems -taken an MAOI like Carbex, Eldepryl, Marplan, Nardil, or Parnate in the last 14 days -an unusual or allergic reaction to morphine, other medications, foods, dyes, or preservatives -pregnant or trying to get pregnant -breast-feeding How should I use this medicine? This medicine is for injection into a muscle, vein, or under the skin. It is usually given by a health care professional in a hospital or clinic setting. If you get this medicine at home, you will be taught how to prepare and give this medicine. Use exactly as directed. Take your medicine at regular intervals. Do not take your medicine more often than directed. Always look at your medicine before using it. Do not use the injection if its color is darker than pale yellow or if it is discolored in any other way. Do not use this medicine if it is cloudy, thickened, colored, or has solid particles in it. It is important that you put your used needles and syringes in a special sharps container. Do not put them in a trash can. If you do not have a sharps container, call your pharmacist or healthcare provider to get one. Talk to your pediatrician regarding the use of this medicine in children. Special care may be needed. Overdosage: If you think you have taken too much of this medicine  contact a poison control center or emergency room at once. NOTE: This medicine is only for you. Do not share this medicine with others. What if I miss a dose? If you miss a dose, take it as soon as you can. If it is almost time for your next dose, take only that dose. Do not take double or extra doses. What may interact with this medicine? This medicine may interact with the following medications: -alcohol -antihistamines for allergy, cough and cold -atropine -certain medicines for anxiety or sleep -certain medicines for bladder problems like oxybutynin, tolterodine -certain medicines for depression like amitriptyline, fluoxetine, sertraline -certain medicines for Parkinson's disease like benztropine, trihexyphenidyl -certain medicines for seizures like phenobarbital, primidone -certain medicines for stomach problems like dicyclomine, hyoscyamine -certain medicines for travel sickness like scopolamine -cimetidine -general anesthetics like halothane, isoflurane, methoxyflurane, propofol -ipratropium -local anesthetics like lidocaine, pramoxine, tetracaine -MAOIs like Carbex, Eldepryl, Marplan, Nardil, and Parnate -medicines that relax muscles for surgery -other narcotic medicines for pain or cough -phenothiazines like chlorpromazine, mesoridazine, prochlorperazine, thioridazine This list may not describe all possible interactions. Give your health care provider a list of all the medicines, herbs, non-prescription drugs, or dietary supplements you use. Also tell them if you smoke, drink alcohol, or use illegal drugs. Some items may interact with your medicine. What should I watch for while using this medicine? Tell your doctor or health care professional if your pain does not go away, if it gets worse, or if you have new or a different type of pain. You may develop tolerance to the medicine. Tolerance means   that you will need a higher dose of the medicine for pain relief. Tolerance is normal  and is expected if you take this medicine for a long time. Do not suddenly stop taking your medicine because you may develop a severe reaction. Your body becomes used to the medicine. This does NOT mean you are addicted. Addiction is a behavior related to getting and using a drug for a non-medical reason. If you have pain, you have a medical reason to take pain medicine. Your doctor will tell you how much medicine to take. If your doctor wants you to stop the medicine, the dose will be slowly lowered over time to avoid any side effects. There are different types of narcotic medicines (opiates). If you take more than one type at the same time or if you are taking another medicine that also causes drowsiness, you may have more side effects. Give your health care provider a list of all medicines you use. Your doctor will tell you how much medicine to take. Do not take more medicine than directed. Call emergency for help if you have problems breathing or unusual sleepiness. You may get drowsy or dizzy. Do not drive, use machinery, or do anything that needs mental alertness until you know how this medicine affects you. Do not stand or sit up quickly, especially if you are an older patient. This reduces the risk of dizzy or fainting spells. Alcohol may interfere with the effect of this medicine. Avoid alcoholic drinks. This medicine will cause constipation. Try to have a bowel movement at least every 2 to 3 days. If you do not have a bowel movement for 3 days, call your doctor or health care professional. Your mouth may get dry. Chewing sugarless gum or sucking hard candy, and drinking plenty of water may help. Contact your doctor if the problem does not go away or is severe. What side effects may I notice from receiving this medicine? Side effects that you should report to your doctor or health care professional as soon as possible: -allergic reactions like skin rash, itching or hives, swelling of the face, lips,  or tongue -breathing problems -confusion -seizures -signs and symptoms of low blood pressure like dizziness; feeling faint or lightheaded, falls; unusually weak or tired -trouble passing urine or change in the amount of urine Side effects that usually do not require medical attention (report to your doctor or health care professional if they continue or are bothersome): -constipation -dry mouth -nausea, vomiting -tiredness This list may not describe all possible side effects. Call your doctor for medical advice about side effects. You may report side effects to FDA at 1-800-FDA-1088. Where should I keep my medicine? Keep out of the reach of children. This medicine can be abused. Keep it in a safe place to protect it from theft. Do not share this medicine with anyone. Selling or giving away this medicine is dangerous and is against the law. If you are using this medicine at home, you will be instructed on how to store this medicine. Throw away any unused medicine after the expiration date on the label. Discard unused medicine and used packaging carefully. Pets and children can be harmed if they find used or lost packages. NOTE: This sheet is a summary. It may not cover all possible information. If you have questions about this medicine, talk to your doctor, pharmacist, or health care provider.  2018 Elsevier/Gold Standard (2015-04-26 21:02:25)  

## 2018-01-02 NOTE — Progress Notes (Signed)
Hematology and Oncology Follow Up Visit  Jeff [name] [id number] 06/26/1956 62 y.o. 01/02/2018   Principle Diagnosis:   Metastatic adenocarcinoma of the pancreas-liver metastasis  Hypercoagulable state with pulmonary embolism/lower extremity DVT  Current Therapy:    Gemzar/Abraxane - s/p cycle #2  Lovenox 80 mg sq BID     Interim History:  Jeff [name] is back for follow-up.  Unfortunately, his decline has been quite rapid.  He is quite emaciated now.  He is jaundiced.  His bilirubin is now 13.  I think it is obvious that his malignancy is aggressive.  I just do not think that he is going to survive more than 1 week.  He really has declined quickly.  His family has wanted to try to get him in into the hospital but he would not let them.  I had a long talk with him today.  His daughter was with him.  I told them that he was just in no condition to take any more chemotherapy.  Chemotherapy at this point would only cause more problems for him.  I just do not want to see that happen.  He is hurting.  I am unsure if he has been able to take his pain medication or not.  He is just so weak I am not sure if he has been able to swallow his medications.  We had a long talk about his prognosis.  I told him that I just did not think that he would make it more than a few days at this point.  Again, his decline has just been so profound.  At this point, we talked about end-of-life issues.  He does not want to be kept alive on a machine.  He does not want heroic measures to be taken.  He does not want CPR or defibrillation.  As such, he is a DO NOT RESUSCITATE.  He cannot go home.  His family, as much as they love him, and are trying, just do not have the ability to care for his needs.  He really needs to go to hospice.  I think that [facility] would be a great idea for him.  I think we can place would really help the family and would give Jeff [name] the respect and dignity that he  needs.  Thankfully, they do have bed openings at [facility] right now.  We will see about transporting him directly to [facility].  I know that Jeff [name] has done his best.  His body just is not able to fight any longer.  Currently, his performance status is ECOG 4..    Medications:  Current Outpatient Medications:  .  amiodarone (PACERONE) 200 MG tablet, Take 1 tablet (200 mg total) by mouth daily., Disp: 30 tablet, Rfl: 0 .  aspirin EC 81 MG EC tablet, Take 1 tablet (81 mg total) by mouth daily., Disp: , Rfl:  .  dronabinol (MARINOL) 5 MG capsule, Take 1 capsule (5 mg total) by mouth 2 (two) times daily before lunch and supper., Disp: 60 capsule, Rfl: 0 .  enoxaparin (LOVENOX) 80 MG/0.8ML injection, Inject 0.75 mLs (75 mg total) into the skin every 12 (twelve) hours., Disp: 60 Syringe, Rfl: 2 .  lidocaine-prilocaine (EMLA) cream, Apply to affected area once, Disp: 30 g, Rfl: 3 .  lipase/protease/amylase (CREON) 36000 UNITS CPEP capsule, Take 1 capsule (36,000 Units total) by mouth 3 (three) times daily before meals., Disp: 270 capsule, Rfl: 1 .  LORazepam (ATIVAN) 0.5 MG tablet,  Take 1 tablet (0.5 mg total) by mouth every 6 (six) hours as needed (Nausea or vomiting)., Disp: 30 tablet, Rfl: 0 .  metoprolol tartrate (LOPRESSOR) 25 MG tablet, Take 1 tablet (25 mg total) by mouth 2 (two) times daily., Disp: 60 tablet, Rfl: 1 .  ondansetron (ZOFRAN) 8 MG tablet, Take 1 tablet (8 mg total) by mouth 2 (two) times daily. (Patient taking differently: Take 8 mg by mouth every 8 (eight) hours as needed. ), Disp: 30 tablet, Rfl: 1 .  oxyCODONE 10 MG TABS, Take 1 tablet (10 mg total) by mouth every 6 (six) hours as needed for severe pain., Disp: 90 tablet, Rfl: 0 .  prochlorperazine (COMPAZINE) 10 MG tablet, Take 1 tablet (10 mg total) by mouth every 6 (six) hours as needed for nausea or vomiting., Disp: 30 tablet, Rfl: 1 .  diltiazem (CARDIZEM CD) 180 MG 24 hr capsule, Take 1 capsule (180 mg total)  by mouth daily., Disp: 30 capsule, Rfl: 1 .  magic mouthwash SOLN, Take 10 mLs by mouth 4 (four) times daily -  before meals and at bedtime. (Patient not taking: Reported on 01/02/2018), Disp: 50 mL, Rfl: 0 .  morphine (MS CONTIN) 15 MG 12 hr tablet, Take 1 tablet (15 mg total) by mouth every 12 (twelve) hours. (Patient not taking: Reported on 01/02/2018), Disp: 60 tablet, Rfl: 0 No current facility-administered medications for this visit.   Facility-Administered Medications Ordered in Other Visits:  .  morphine 4 MG/ML injection 4 mg, 4 mg, Intravenous, Once, Alegandra Sommers, Rudell Cobb, MD  Allergies: No Known Allergies  Past Medical History, Surgical history, Social history, and Family History were reviewed and updated.  Review of Systems: Review of Systems  Constitutional: Positive for appetite change and fatigue.  HENT:  Negative.   Eyes: Negative.   Respiratory: Positive for shortness of breath.   Cardiovascular: Positive for leg swelling.  Gastrointestinal: Positive for abdominal pain, constipation and nausea.  Endocrine: Negative.   Genitourinary: Negative.    Musculoskeletal: Positive for back pain and myalgias.  Skin: Negative.   Neurological: Negative.   Hematological: Negative.   Psychiatric/Behavioral: Negative.     Physical Exam:  oral temperature is 97.6 F (36.4 C). His blood pressure is 108/73 and his pulse is 95. His respiration is 24 (abnormal) and oxygen saturation is 100%.   Wt Readings from Last 3 Encounters:  12/25/17 164 lb 2 oz (74.4 kg)  12/17/17 168 lb 12 oz (76.5 kg)  12/13/17 166 lb 14.2 oz (75.7 kg)    Physical Exam  Constitutional: He appears distressed.  Jeff [name] is cachectic.  He is obviously jaundiced.  HENT:  Head: Normocephalic and atraumatic.  Mouth/Throat: Oropharynx is clear and moist.  Eyes: Pupils are equal, round, and reactive to light. EOM are normal. Scleral icterus is present.  Neck: Normal range of motion.  Cardiovascular: Normal heart  sounds.  Pulmonary/Chest: Effort normal and breath sounds normal.  Abdominal: Soft. Bowel sounds are normal.  Musculoskeletal: Normal range of motion. He exhibits no edema, tenderness or deformity.  Lymphadenopathy:    He has no cervical adenopathy.  Neurological:  He is somewhat lethargic.  He is arousable.  He is oriented to person and place.  Skin: No rash noted. No erythema.  Skin exam shows dry skin.  He has somewhat clammy.  He is jaundiced.  Psychiatric: He has a normal mood and affect. His behavior is normal. Judgment and thought content normal.  Vitals reviewed.    Lab Results  Component Value Date   WBC 25.3 (H) 01/02/2018   HGB 11.3 (L) 01/02/2018   HCT 34.2 (L) 01/02/2018   MCV 96.3 01/02/2018   PLT 156 01/02/2018     Chemistry      Component Value Date/Time   NA 137 01/02/2018 1245   K 4.5 01/02/2018 1245   CL 107 01/02/2018 1245   CO2 22 01/02/2018 1245   BUN 31 (H) 01/02/2018 1245   CREATININE 1.60 (H) 01/02/2018 1245      Component Value Date/Time   CALCIUM 8.7 01/02/2018 1245   ALKPHOS 504 (H) 01/02/2018 1245   AST 124 (H) 01/02/2018 1245   ALT 57 (H) 01/02/2018 1245   BILITOT 12.6 (HH) 01/02/2018 1245       Impression and Plan: Jeff [name] is a 62 year old African-American male with metastatic pancreatic cancer.  He has extensive hepatic metastasis.  He has an incredibly elevated CA 19-9.  Again, Jeff [name] has done his best.  He has tried his hardest to improve his situation with his cancer.  He was incredibly hypercoagulable because of his cancer.  He has had thromboembolic disease while on blood thinner.  He has had a past stroke.  At this point, our goal clearly is quality of life and comfort.  I do not think he will live more than a week.  I does want him to have comfort in respect and dignity.  I want his family to be able to be with him and not be his nurse made 24 hours a day.  Again, hospice has done a fantastic job and [employer] [facility] being  able to take him directly from our office is truly a gift from God.  We will transport him directly to [facility] via EMS.  I have truly enjoyed being with Jeff [name].  He is such a nice guy.  He really is "old school" and has never wanted his family to worry about him.  Volanda Napoleon, MD 5/22/20192:58 PM

## 2018-01-02 NOTE — Patient Instructions (Signed)

## 2018-01-03 LAB — IRON AND TIBC
IRON: 33 ug/dL — AB (ref 42–163)
Saturation Ratios: 31 % — ABNORMAL LOW (ref 42–163)
TIBC: 106 ug/dL — AB (ref 202–409)
UIBC: 73 ug/dL

## 2018-01-03 LAB — FERRITIN: Ferritin: 6762 ng/mL — ABNORMAL HIGH (ref 22–316)

## 2018-01-08 ENCOUNTER — Inpatient Hospital Stay: Payer: Medicare Other

## 2018-01-08 ENCOUNTER — Telehealth: Payer: Self-pay | Admitting: *Deleted

## 2018-01-08 ENCOUNTER — Inpatient Hospital Stay: Payer: Medicare Other | Admitting: Hematology & Oncology

## 2018-01-09 LAB — CANCER ANTIGEN 19-9

## 2018-01-12 NOTE — Telephone Encounter (Signed)
Received message from [facility] that patient passed away at [facility] on 01/05/2018 at 0951am.  Dr Marin Olp notified.

## 2018-01-12 DEATH — deceased

## 2018-01-22 ENCOUNTER — Other Ambulatory Visit: Payer: Medicare Other

## 2018-01-22 ENCOUNTER — Ambulatory Visit: Payer: Medicare Other

## 2018-02-05 ENCOUNTER — Other Ambulatory Visit: Payer: Medicare Other

## 2018-02-05 ENCOUNTER — Ambulatory Visit: Payer: Medicare Other

## 2018-02-05 ENCOUNTER — Ambulatory Visit: Payer: Medicare Other | Admitting: Hematology & Oncology

## 2018-03-27 ENCOUNTER — Ambulatory Visit: Payer: Medicare Other | Admitting: Adult Health

## 2018-03-27 NOTE — Progress Notes (Deleted)
[facility]  PATIENT: Jeff [name] DOB: [birth year]   REASON FOR VISIT:  Follow up for stroke in 2016 HISTORY FROM:  patient   HISTORY OF PRESENT ILLNESS:Jeff [name] is a 66 year African-American male seen today for follow-up for first office visit following hospital admission for stroke in August 2016.Jeff [name] is a 62 y.o. male who reported that he was walking to the ATM on 03/19/15 morning and lost control of his cane that he usually holds in his right hand. When he attempted to pick it up he was unable to because he couldn't control his right hand. He went to the store and was unable to open his wallet again because of his right hand. When he attempted to speak his speech was slurred. The patient presented at that time. Symptoms have reoslved. Patient on Coumadin due to aortic and mitral valve replacement but INR is normal today. CT scan of the head showed no acute infarct but showed old bilateral cerebral and cerebellar hemispheric infarcts. MRI scan showed an acute small left middle cerebellar peduncle infarct and multiple old bilateral strokes. Transthoracic echo showed ejection fraction of 45-40% with a mechanical heart valve. Carotid Doppler showed no significant extracranial stenosis. LDL cholesterol was elevated at 115 mg percent. Hemoglobin A1c was 5.4. Patient's anti-coagulation was suboptimal and he was started on warfarin and has done well since discharge. He states his gait and balance remained poor but this is pre-existing. His right hand weakness has improved. His blood pressure is well controlled and today it is 110/70. He states his INR do fluctuate a little bit but is tolerating warfarin well without bleeding or bruising. He has his blood checked every week at family medicine clinic. Is tolerating Pravachol well without side effects. His tongue to quit smoking and has cut down to 1-2 cigarettes per day and is on nicotine patch. Update 02/29/2016 PS: He returns  for follow-up after last visit in October 2016. Continues to do well from neurovascular standpoint without recurrent stroke or TIA symptoms. He remains on warfarin which is tolerating well without bleeding or bruising and states his INR has been fairly steady. He however has fallen twice on both occasions he was trying to get up from a chair and sit in the couch and he slipped. Fortunately did not have any significant bleeding bruising or injury. He states his blood pressure is well controlled and today it is 111/80 in office. He remains on Pravachol which is tolerating well without muscle aches or pains. He plans to have lipid profile and lab work soon with primary physician. He continues to smoke 1-2 cigarettes per day and is not willing to quit. Patient is to have a cane coming in daily to help him clean and cook and would like for that to be continued. I advised him to discuss this with his primary physician. He still has  mild stiffness and incoordination in his left leg and he states he does use his cane most of the time. UPDATE  07/18/2018[name] , 62 year old male returns for follow-up  History of stroke in October 2016.  He continues to do well from a neurovascular standpoint.He remains on Warfarin without bleeding or bruising. His INR has been stable and is followed by family medicine. He remains on Pravachol without complaints of myalgias. He denies any falls. Ambulates with single-point cane . Blood pressure the office today at 106/75. He continues to smoke 2 cigarettes a day. Denies alcohol. Smokes marijuana. He returns for  reevaluation UPDATE 09/27/17 PS/JV: Jeff [name] is a 62 y.o. male with PMH of mechanical mitral valve and mechanical aortic valve- not indicated for mechanical valves -he is seen today for first office follow-up visit for hospital admission for stroke in December 2018. He developed sudden onset of aphasia and speech difficulties and was brought to [facility] where  blood pressure was slightly elevated. It was unclear whether he was on Coumadin on Xarelto and hence a TPA was not given. MRI scan showed a tiny punctate left frontal white matter infarct. After discussion with his cardiologist it was clarified that the patient should be on Coumadin due to his mechanical heart valve and not on Xarelto and hence Coumadin was resumed. . Noncontrast CT of the head  reviewed and showed no acute changes, might have a possible dense left MCA. CT angiogram of the head and neck reviewed and showed no large vessel occlusion that might have been amenable to endovascular intervention. CT perfusion study showed a small area of penumbra in the left parietal area with no core which might reflect a new area of ischemia and also a left frontal area of core only possibly reflecting the old infarcts from prior stroke. TPA is contraindicated. No large vessel occlusion for endovascular procedure. MRI reviewed and showed subtle 12 mm focus of diffusion abnormality within the subcortical white matter of the left frontoparietal region, suspicious for possible acute and/or early subacute white matter ischemia and multiple remote bilateral cerebral and cerebellar infarcts which are stable.  2D echo showed an EF of 50-55% and no cardiac source of emboli was identified.  Upon discharge, expressive aphasia resolved and recommended to restart Xarelto due to patient compliance on this medication and add an aspirin 81 mg daily.  Also, increased to 40 mg daily.  Patient discharged home in stable condition.   Since discharge from the hospital in December 2018, patient was placed back on Coumadin by his cardiologist Dr. Einar Gip who has also been checking INR levels.  Patient states he has been compliant with Coumadin and previous INR check 2 weeks ago and was at 2.9.  Patient will have a recheck tomorrow.  Patient continues to take aspirin 81 mg.  Denies side effects.of increased bleeding or bruising.  Patient  continues to take pravastatin and denies side effects.  Blood pressure today 99/72 patient states this is low for him.  Patient checks blood pressure at home and SBP ranges 110-120s.  Patient continues to smoke cigarettes and marijuana.  He states this is infrequent as he cannot always afford cigarettes and marijuana.  Patient states he has been trying to quit smoking cigarettes.  Patient denies new or worsening stroke/TIA symptoms since discharge.  Interval History 03/27/18: Since previous appointment, patient was seen in the ED on 11/07/2017 for intense abdominal pain.  CT of the abdomen and pelvis were performed which showed pancreatic mass concerning for malignancy and mets to the liver.  Stool for occult blood was positive and hemoglobin was dropped by 2 from previous levels.  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  He was also found to have a PE in the setting of subtherapeutic INR and he was discharged on therapeutic Lovenox.  Also found for endocarditis but determined likely noninfectious and possibly in the setting of malignancy as he was afebrile and blood cultures were negative and recommended TEE as outpatient with Dr. Einar Gip.  GI was consulted due to bright red blood per  rectum and it was determined there is no need for colonoscopy or any other procedures at that time.  Was on Coumadin due to left lower extremity DVT but due to history of being subtherapeutic recommended to continue Lovenox. Patient initiated chemotherapy on 12/05/2017 but did have associated symptoms including cough, vomiting, diarrhea, worsening abdominal pain and poor p.o. intake and was found to be progressively more altered therefore returned to hospital on 12/08/2017.  Patient was found to be in A. fib with RVR with heart rate up to 162 bpm.  He was worked up for SIRS versus sepsis with unknown origin due to elevated WBC, increased lactic acid but chest x-ray is  within normal limits but per notes, due to previously noted mitral valve thrombus versus vegetation with endocarditis this possibly could be the cause.  It was doubted that this was a true infectious source and suspected more related to new PE and A. fib.  Patient subsequently improved or he was afebrile and normotensive.  Due to A. fib with RVR patient was started on amiodarone as well as continuation of Lovenox.  Patient did undergo a TEE on 11/22/2017 with Dr. 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    REVIEW OF SYSTEMS: Full 14 system review of systems performed and notable only for those listed, all others are neg: eye discharge and cough    ALLERGIES: No Known Allergies  HOME MEDICATIONS: Outpatient Medications Prior to Visit  Medication Sig Dispense Refill  . amiodarone (PACERONE) 200 MG tablet Take 1 tablet (200 mg total) by mouth daily. 30 tablet 0  . aspirin EC 81 MG EC tablet Take 1 tablet (81 mg total) by mouth daily.    Marland Kitchen diltiazem (CARDIZEM CD) 180 MG 24 hr capsule Take 1 capsule (180 mg total) by mouth daily. 30 capsule 1  . lidocaine-prilocaine (EMLA) cream Apply to affected area once 30 g 3  . lipase/protease/amylase (CREON) 36000 UNITS CPEP capsule Take 1 capsule (36,000 Units total) by mouth 3 (three) times daily before meals. 270 capsule 1  . LORazepam (ATIVAN) 0.5 MG tablet Take 1 tablet (0.5 mg total) by mouth every 6 (six) hours as needed (Nausea or vomiting). 30 tablet 0  . magic mouthwash SOLN Take 10 mLs by mouth 4 (four) times daily -  before meals and at bedtime. (Patient not taking: Reported on 01/02/2018) 50 mL 0  . metoprolol tartrate (LOPRESSOR) 25 MG tablet Take 1 tablet (25 mg total) by mouth 2 (two) times daily. 60 tablet 1  . morphine (MS CONTIN) 15 MG 12 hr tablet Take 1 tablet (15 mg total) by mouth  every 12 (twelve) hours. (Patient not taking: Reported on 01/02/2018) 60 tablet 0  . ondansetron (ZOFRAN) 8 MG tablet Take 1 tablet (8 mg total) by mouth 2 (two) times daily. (Patient taking differently: Take 8 mg by mouth every 8 (eight) hours as needed. ) 30 tablet 1  . oxyCODONE 10 MG TABS Take 1 tablet (10 mg total) by mouth every 6 (six) hours as needed for severe pain. 90 tablet 0  . prochlorperazine (COMPAZINE) 10 MG tablet Take 1 tablet (10 mg total) by mouth every 6 (six) hours as needed for nausea or vomiting. 30 tablet 1   No facility-administered medications prior to visit.     PAST MEDICAL HISTORY: Past Medical History:  Diagnosis Date  . Acute  ischemic stroke ([place])   . Cancer [facility])    pancreatic  . Goals of care, counseling/discussion 11/30/2017  . H/O mitral valve replacement with mechanical valve   . Heart disease   . High cholesterol   . History of CVA (cerebrovascular accident)    02/2011  . Hx of aortic valve replacement, mechanical   . HX: long term anticoagulant use   . Iron deficiency anemia due to chronic blood loss 12/05/2017  . Stroke [facility])     PAST SURGICAL HISTORY: Past Surgical History:  Procedure Laterality Date  . AORTIC VALVE REPLACEMENT    . CARDIAC VALVE REPLACEMENT    . IR FLUORO GUIDE PORT INSERTION RIGHT  11/13/2017  . IR US GUIDE VASC ACCESS RIGHT  11/13/2017  . MITRAL VALVE REPLACEMENT    . TEE WITHOUT CARDIOVERSION N/A 11/22/2017   Procedure: TRANSESOPHAGEAL ECHOCARDIOGRAM (TEE);  Surgeon: Adrian Prows, MD;  Location: [facility] ENDOSCOPY;  Service: Cardiovascular;  Laterality: N/A;    FAMILY HISTORY: Family History  Problem Relation Age of Onset  . Diabetes Mother        Died before her 54 birthday  . Heart attack Brother     SOCIAL HISTORY: Social History   Socioeconomic History  . Marital status: Single    Spouse name: Not on file  . Number of children: Not on file  . Years of education: Not on file  . Highest education level: Not on file    Occupational History  . Not on file  Social Needs  . Financial resource strain: Not on file  . Food insecurity:    Worry: Not on file    Inability: Not on file  . Transportation needs:    Medical: Not on file    Non-medical: Not on file  Tobacco Use  . Smoking status: Former Smoker    Packs/day: 0.05    Types: Cigarettes    Last attempt to quit: 11/04/2017    Years since quitting: 0.3  . Smokeless tobacco: Never Used  . Tobacco comment: smoke two per day  Substance and Sexual Activity  . Alcohol use: Yes    Comment: wine coolers prn  . Drug use: Yes    Frequency: 7.0 times per week    Types: Marijuana    Comment: smoke when he can get it  . Sexual activity: Yes  Lifestyle  . Physical activity:    Days per week: Not on file    Minutes per session: Not on file  . Stress: Not on file  Relationships  . Social connections:    Talks on phone: Not on file    Gets together: Not on file    Attends religious service: Not on file    Active member of club or organization: Not on file    Attends meetings of clubs or organizations: Not on file    Relationship status: Not on file  . Intimate partner violence:    Fear of current or ex partner: Not on file    Emotionally abused: Not on file    Physically abused: Not on file    Forced sexual activity: Not on file  Other Topics Concern  . Not on file  Social History Narrative  . Not on file     PHYSICAL EXAM  There were no vitals filed for this visit. There is no height or weight on file to calculate BMI.  Generalized: Well developed, elderly African American male,  in no acute distress  Head: normocephalic and  atraumatic Neck: Supple, no carotid bruits  Cardiac: Regular rate rhythm, no murmur, mechanical click present Musculoskeletal: No deformity   Neurological examination   Mentation: Alert oriented to time, place, history taking. Attention span and concentration appropriate. Recent and remote memory intact.  Follows  all commands speech and language fluent.  Cranial nerve II-XII: Pupils were equal round reactive to light extraocular movements were full, visual field were full on confrontational test. Facial sensation and strength were normal. hearing was intact to finger rubbing bilaterally. Uvula tongue midline. head turning and shoulder shrug were normal and symmetric.Tongue protrusion into cheek strength was normal. Motor: normal bulk and tone, full strength in the BUE, BLE, fine finger movements normal, no pronator drift. No focal weakness Sensory: normal and symmetric to light touch, on the face arms and legs  Coordination: finger-nose-finger, heel-to-shin bilaterally, no dysmetria, no tremor Reflexes: 2+ bilaterally and symmetric  Gait and Station: Rising up from seated position without assistance, normal stance,  moderate stride with cane, good arm swing, smooth turning, able to perform tiptoe, and heel walking without difficulty. Tandem gait is unsteady  DIAGNOSTIC DATA (LABS, IMAGING, TESTING) - I reviewed patient records, labs, notes, testing and imaging myself where available.  Lab Results  Component Value Date   WBC 25.3 (H) 01/02/2018   HGB 11.3 (L) 01/02/2018   HCT 34.2 (L) 01/02/2018   MCV 96.3 01/02/2018   PLT 156 01/02/2018      Component Value Date/Time   NA 137 01/02/2018 1245   K 4.5 01/02/2018 1245   CL 107 01/02/2018 1245   CO2 22 01/02/2018 1245   GLUCOSE 131 (H) 01/02/2018 1245   BUN 31 (H) 01/02/2018 1245   CREATININE 1.60 (H) 01/02/2018 1245   CALCIUM 8.7 01/02/2018 1245   PROT 5.6 (L) 01/02/2018 1245   ALBUMIN 2.2 (L) 01/02/2018 1245   AST 124 (H) 01/02/2018 1245   ALT 57 (H) 01/02/2018 1245   ALKPHOS 504 (H) 01/02/2018 1245   BILITOT 12.6 (HH) 01/02/2018 1245   GFRNONAA >60 12/13/2017 0354   GFRAA >60 12/13/2017 0354   Lab Results  Component Value Date   CHOL 163 07/30/2017   HDL 49 07/30/2017   LDLCALC 104 (H) 07/30/2017   TRIG 50 07/30/2017   CHOLHDL 3.3  07/30/2017   Lab Results  Component Value Date   HGBA1C 4.7 (L) 11/08/2017    Ct Angio Head and neck and perfusion W Or Wo Contrast 07/30/2017 IMPRESSION: 1. Negative CTA for emergent large vessel occlusion. 2. Perfusion defect at the posterior left frontal parietal region, suspicious for acute ischemia. Matched perfusion defect at the anterior left frontal lobe consistent with chronic infarction. 3. Atherosclerotic change involving the carotid bifurcations and intracranial vasculature as above. No high-grade or correctable stenosis. 4. **An incidental finding of potential clinical significance has been found. 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 Recommend annual imaging followup by CTA or MRA. This recommendation follows 2010 ACCF/AHA/AATS/ACR/ASA/SCA/SCAI/SIR/STS/SVM Guidelines for the Diagnosis and Management of Patients with Thoracic Aortic Disease. Circulation. 2010; 121: [id number]** Electronically Signed   By: Jeannine Boga M.D.   On: 07/30/2017 00:33   Jeff Brain Wo Contrast 07/30/2017 IMPRESSION: 1. Subtle 12 mm focus of diffusion abnormality within the subcortical white matter of the left frontoparietal region, suspicious for possible acute and/or early subacute white matter ischemia. No associated hemorrhage or mass effect. 2. No other acute intracranial abnormality. 3. Multiple remote bilateral cerebral and cerebellar infarcts, stable. Electronically Signed   By:  Jeannine Boga M.D.   On: 07/30/2017 04:31   Ct Head Code Stroke Wo Contrast 07/29/2017 IMPRESSION: 1. No acute intracranial infarct or other abnormality identified. 2. ASPECTS is 10. 3. Multiple remote bilateral cerebral and cerebellar infarcts, stable from previous. Critical Value/emergent results were called by telephone at the time of interpretation on 07/29/2017 at 11:33 pm to Dr. Rory Percy , who verbally acknowledged these results. Electronically Signed   By: Jeannine Boga M.D.   On:  07/29/2017 23:37   Echocardiogram:  Study Conclusions - Left ventricle: The cavity size was normal. Wall thickness was increased in a pattern of mild LVH. Systolic function was normal. The estimated ejection fraction was in the range of 50% to 55%. Post op septal wall hypokinesis. Doppler parameters are consistent with abnormal left ventricular relaxation (grade 1 diastolic dysfunction). - Aortic valve: A mechanical prosthesis was present. The prosthesis had a normal range of motion. Peak velocity (S): 216 cm/s. Valve area (VTI): 1.35 cm^2. Valve area (Vmax): 1.39 cm^2. Valve area (Vmean): 1.43 cm^2. - Aorta: Ascending aortic diameter: 43 mm (S). - Ascending aorta: The ascending aorta was mildly dilated. - Mitral valve: A mechanical prosthesis was present. The prosthesis had a normal range of motion. - Left atrium: The atrium was moderately dilated. Impressions: - No cardiac source of emboli was indentified.    ASSESSMENT AND PLAN 12 year African-American male with small left cerebellar infarct in August 2016 secondary to cardioembolic embolism from mechanical heart valve with suboptimal anticoagulation.  On 07/29/2017, patient had a small left frontal parietal region infarct secondary to embolism from the heart because of mechanical valves and possibly suboptimal anticoagulation.  Multiple vascular risk factors of mechanical heart valve, history of stroke, CAD, polysubstance abuse, hypertension, hyperlipidemia and smoking.    Continue aspirin 81 mg daily and warfarin daily  for secondary stroke prevention  -cardiologist to follow up on aspirin, coumadin and INR checks -smoking cessation   Greater than 50% of this 25-minute visit was spent on counseling and coordination of care, reviewing test results, reviewing medications, discussing and reviewing the diagnosis of recent stroke and management of risk factors.  Time was also spent educating patient on recent  stroke, medication compliance, use of Coumadin with mechanical heart valve, smoking cessation and discontinue the use of illicit drugs.  Venancio Poisson, AGNP-BC  [facility] / [facility] [address] [place], [place] [address]  Phone [phone] Fax [phone] Note: This document was prepared with digital dictation and possible smart phrase technology. Any transcriptional errors that result from this process are unintentional.

## 2019-02-08 IMAGING — CT CT ANGIO CHEST
2 of 8 series · 17 of 46 positions shown · IV contrast (OMNI)
Comparison: 11/13/2017

CLINICAL DATA: History of pancreatic cancer and pulmonary emboli,
on Lovenox. Atrial fibrillation. Assess response to Lovenox.

EXAM:
CT ANGIOGRAPHY CHEST WITH CONTRAST
TECHNIQUE: Multidetector CT imaging of the chest was performed using the
standard protocol during bolus administration of intravenous
contrast. Multiplanar CT image reconstructions and MIPs were
obtained to evaluate the vascular anatomy.
CONTRAST:  100mL LV16IC-K7Z IOPAMIDOL (LV16IC-K7Z) INJECTION 76%

[Series 6: thins · axial · 0.65mm/px · z∈[+1271,+1511]mm · 14 of 265 slices shown]
[im 13/265  lung]
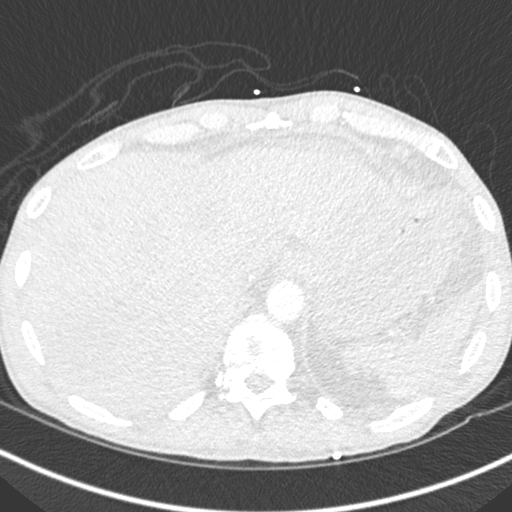
[im 37/265  soft-tissue]
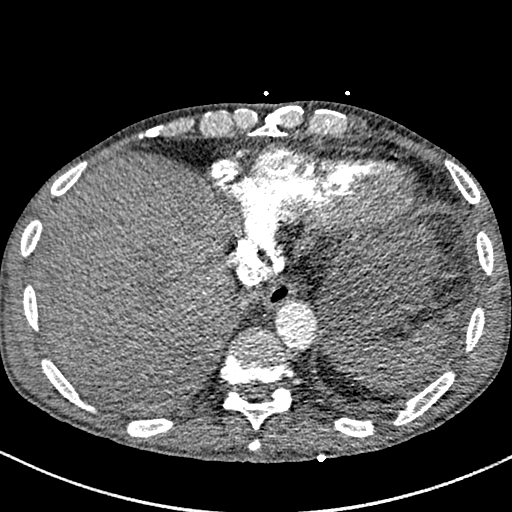
[im 49/265  lung]
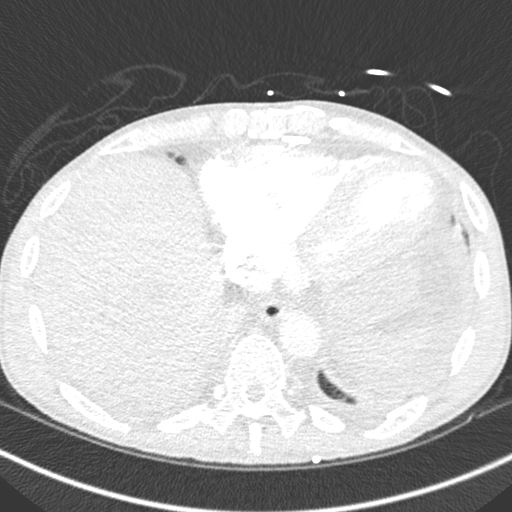
[im 73/265  soft-tissue]
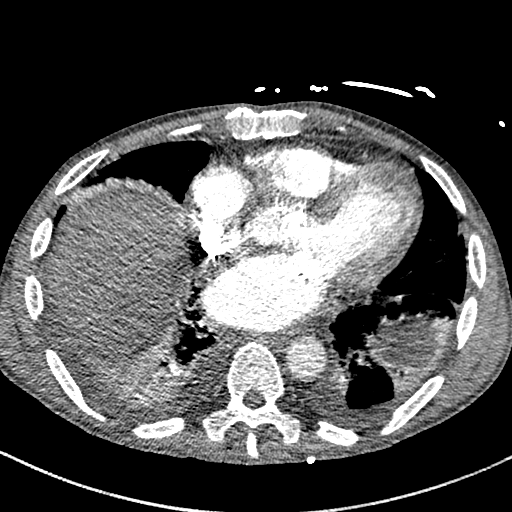
[im 85/265  lung]
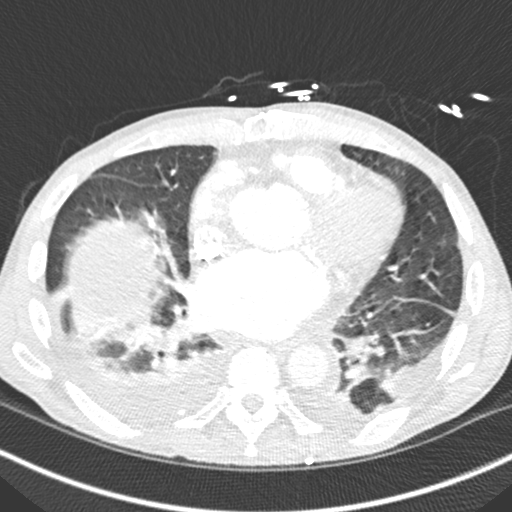
[im 109/265  soft-tissue]
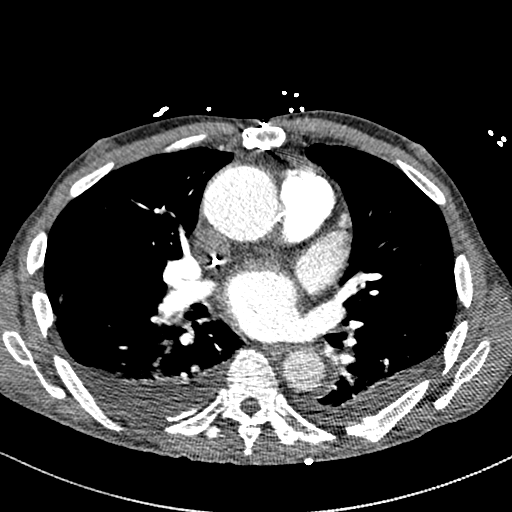
[im 121/265  lung]
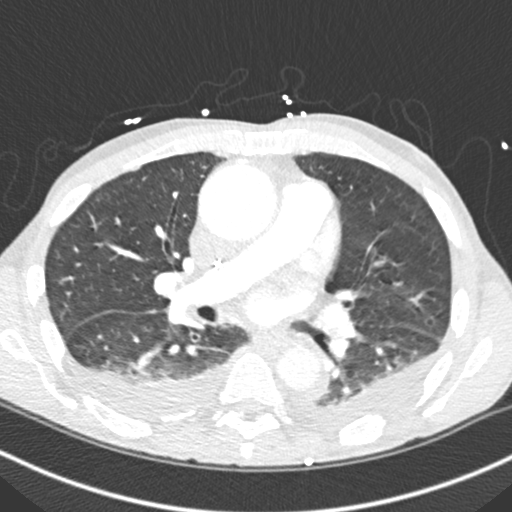
[im 145/265  soft-tissue]
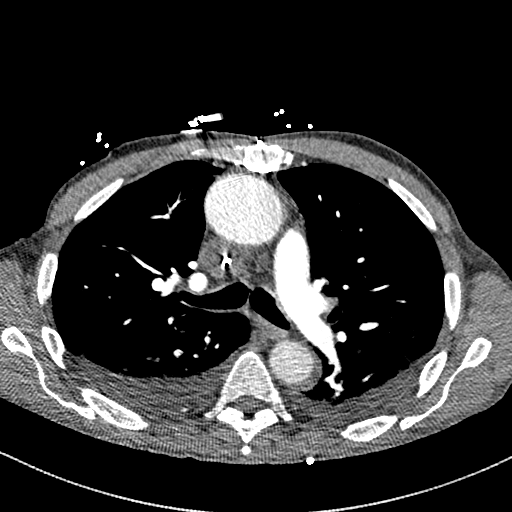
[im 157/265  lung]
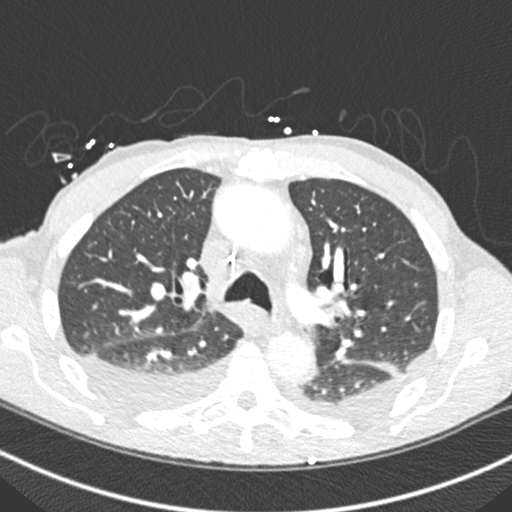
[im 181/265  soft-tissue]
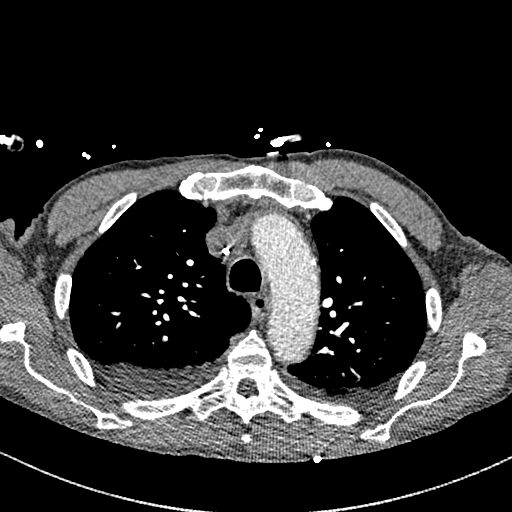
[im 193/265  lung]
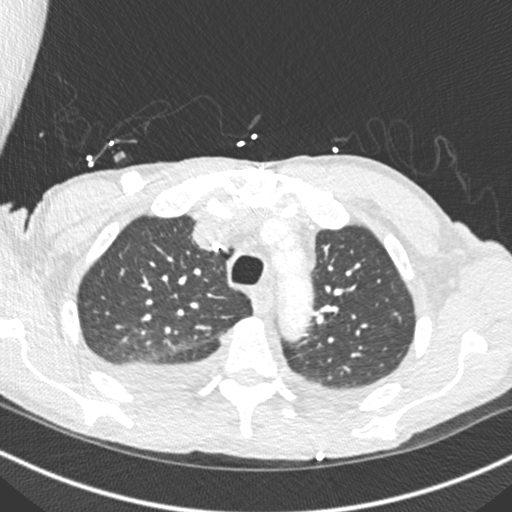
[im 217/265  soft-tissue]
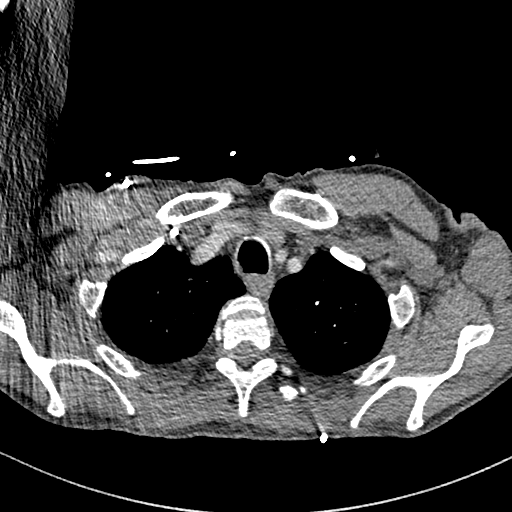
[im 229/265  lung]
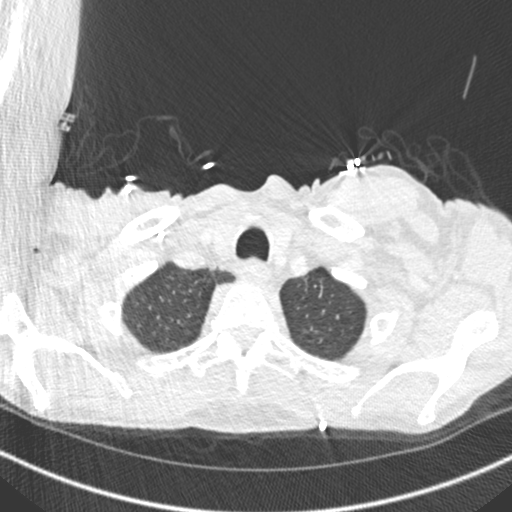
[im 253/265  soft-tissue]
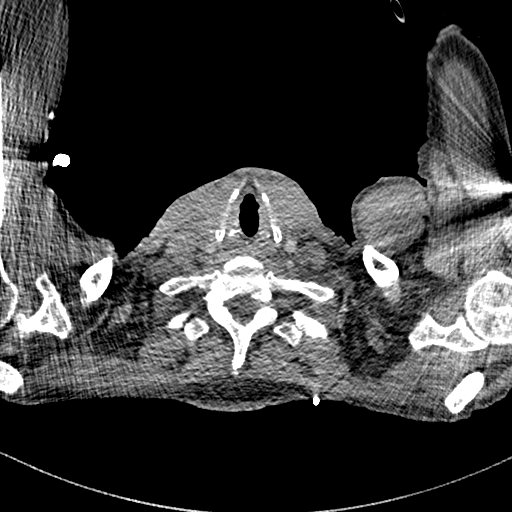

[Series 8: coronal mpr · coronal · 0.52mm/px · 3 of 123 slices shown]
[im 31/123  soft-tissue]
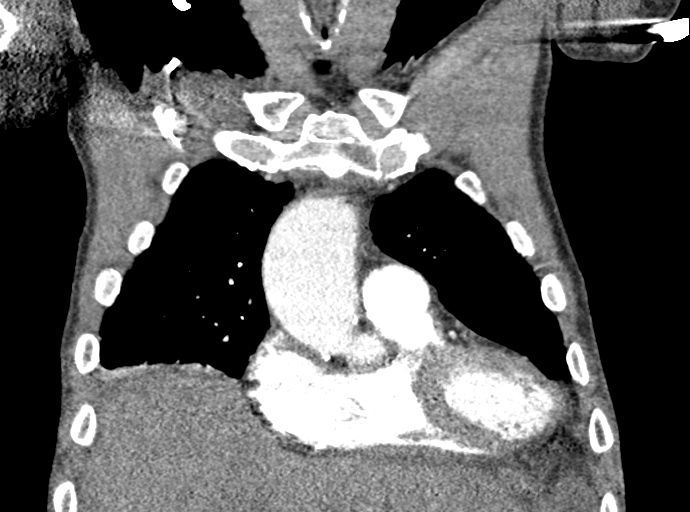
[im 62/123  soft-tissue]
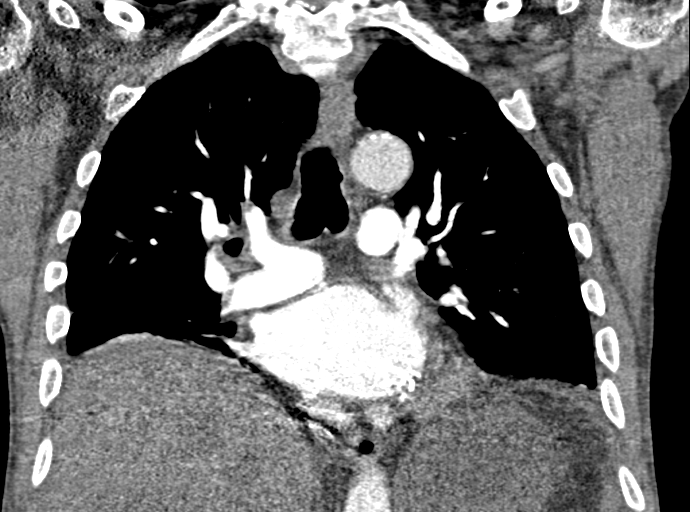
[im 92/123  soft-tissue]
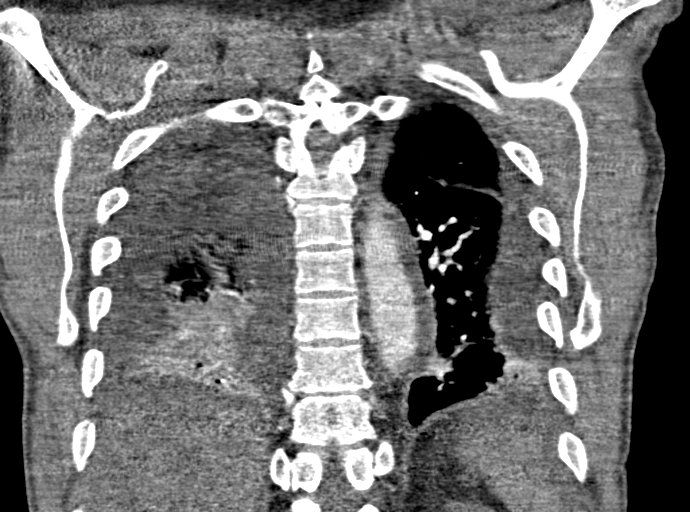

[17 of 46 positions shown; findings below may reference images not displayed]

FINDINGS: Cardiovascular: Previously seen left pulmonary emboli are no longer
visualized. Embolus in the right lower lobe pulmonary artery is
again noted, slightly decreased in size. New pulmonary embolus is
now noted in the right upper lobe. 5 cm ascending thoracic aortic
aneurysm again noted, compared with 4.8 cm previously. Scattered
aortic calcifications. Heart is enlarged. Prior mitral valve
replacement.

Mediastinum/Nodes: No enlarged mediastinal, hilar or axillary lymph
nodes.

Lungs/Pleura: Small bilateral pleural effusions are new since prior
study. Compressive atelectasis in the lower lobes bilaterally.
Calcified pleural plaques are noted on the right, likely related to
old hemothorax or empyema.

Upper Abdomen: Numerous areas of low-density throughout the liver
compatible with metastases as seen on prior study.

Musculoskeletal: Right Port-A-Cath remains in place. Chest wall soft
tissues otherwise unremarkable. Stable mild compression fracture
through the inferior endplate at T11.

Review of the MIP images confirms the above findings.
IMPRESSION: Improvement in the clot burden within the left lung and in the right
lower lobe. There is new pulmonary embolus in the right upper lobe.

Small bilateral pleural effusions with compressive atelectasis in
the lower lobes.

Cardiomegaly. 5 cm ascending thoracic aortic aneurysm. Recommend
semi-annual imaging followup by CTA or MRA and referral to
cardiothoracic surgery if not already obtained. This recommendation
follows 4000 ACCF/AHA/AATS/ACR/ASA/SCA/CRISTHIAN DARWIN/PFEFFER/MELHADO/HASEN Guidelines
for the Diagnosis and Management of Patients With Thoracic Aortic
Disease. Circulation. 4000; 121: [id number]

Calcified pleural plaques on the right, stable.

Numerous hepatic metastases again noted.

## 2019-02-23 IMAGING — DX DG CHEST 2V
3 series · 3 of 3 positions shown · non-contrast
Comparison: Chest x-ray of December 08, 2017 and chest CT scan of
December 10, 2017.

CLINICAL DATA: Increasing shortness of breath and dyspnea over the
past 2 weeks. Recent pulmonary embolism. History of valvular heart
disease, CVA, pancreatic malignancy.

EXAM:
CHEST - 2 VIEW

[chest lat]
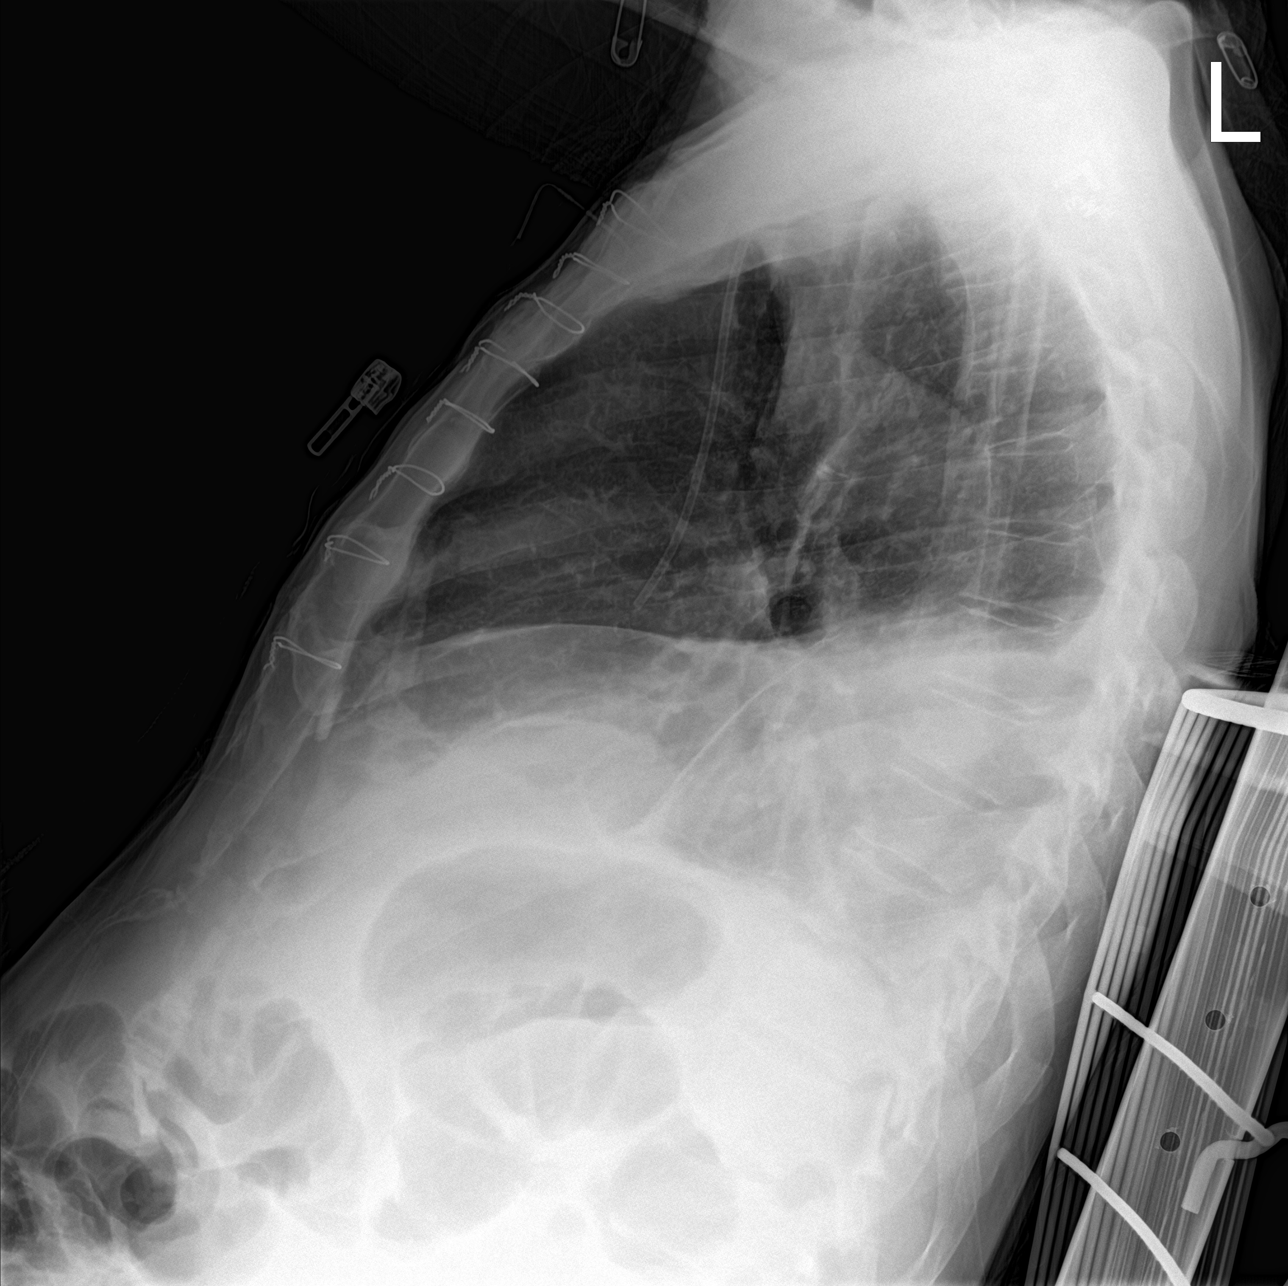

[chest ap (1 of 2)]
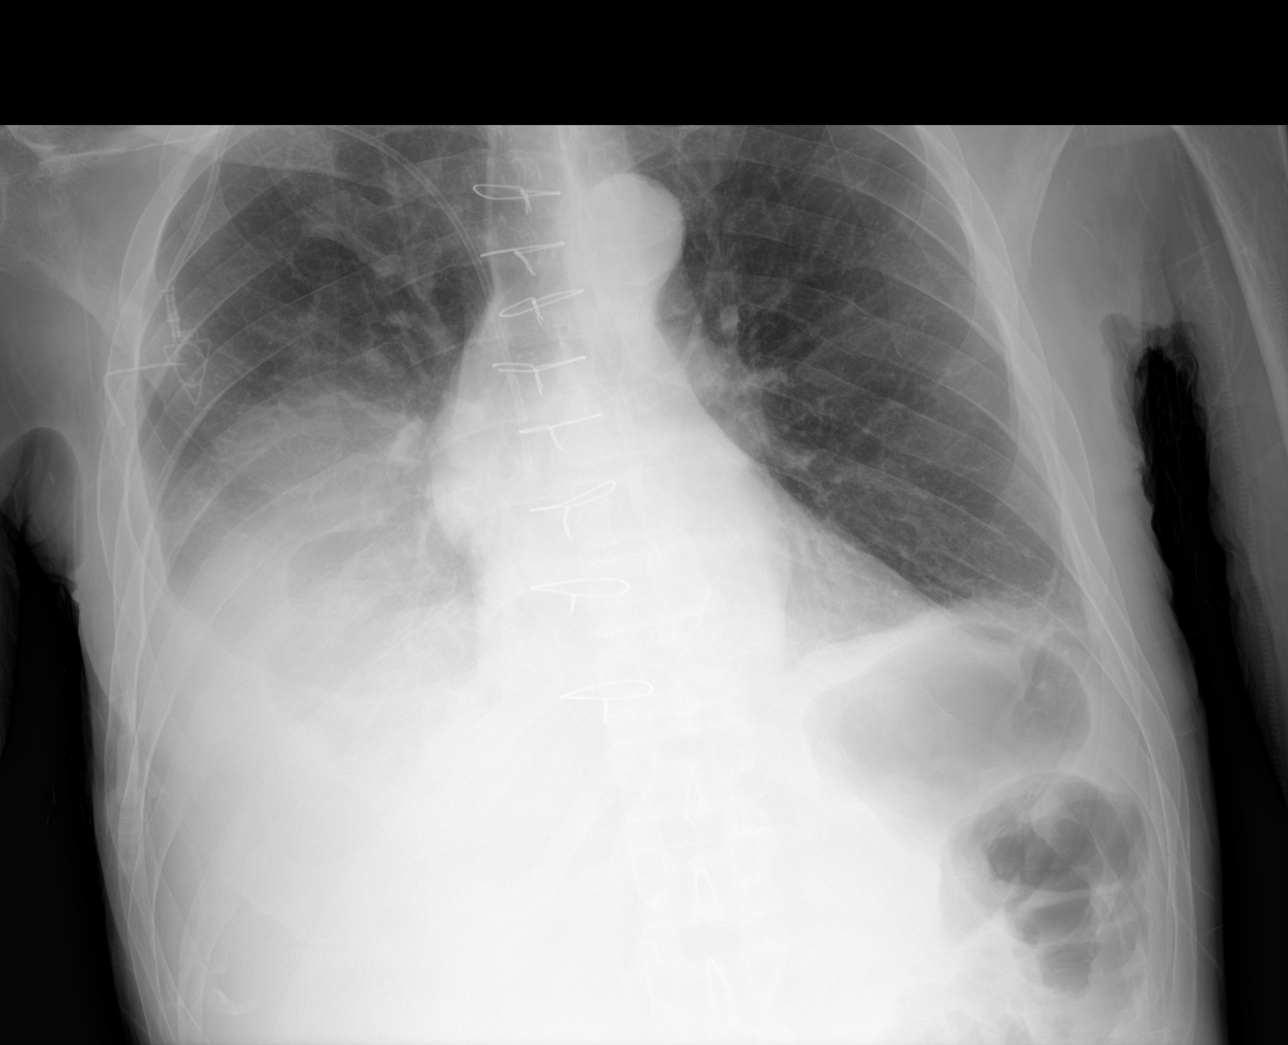

[chest ap (2 of 2)]
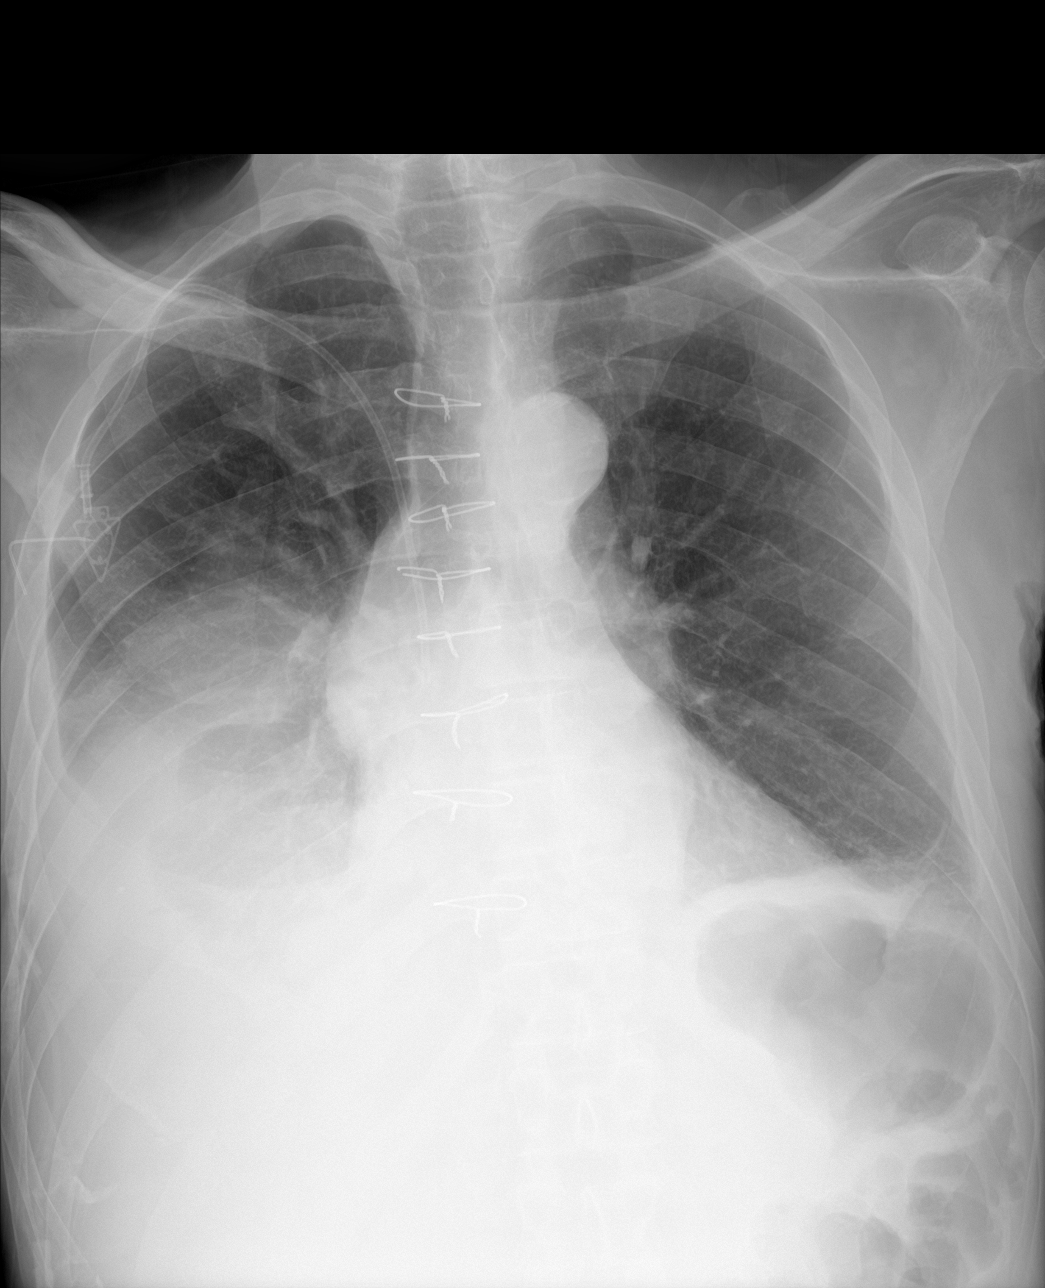

[3 of 3 positions shown; findings below may reference images not displayed]

FINDINGS: The right pleural effusion has increased in volume. There is a trace
of pleural fluid on the left. The right hemidiaphragm is now
obscured. The heart and pulmonary vascularity are normal. There is
calcification in the wall of the thoracic aorta.
IMPRESSION: Increased volume of the right pleural effusion such that it occupies
approximately [DATE] of the pleural space volume. Trace left pleural
effusion. No CHF. One cannot exclude right basilar pneumonia.
